# Patient Record
Sex: Male | Born: 1985 | Race: White | Hispanic: No | Marital: Single
Health system: Midwestern US, Community
[De-identification: ages and names within clinical notes are randomized; demographics above are authoritative.]

## PROBLEM LIST (undated history)

## (undated) VITALS — BP 129/87 | HR 92 | Temp 98.5°F | Resp 16 | Ht 70.0 in | Wt 261.0 lb

## (undated) DIAGNOSIS — S069X9A Unspecified intracranial injury with loss of consciousness of unspecified duration, initial encounter: Secondary | ICD-10-CM

## (undated) DIAGNOSIS — S069XAA Unspecified intracranial injury with loss of consciousness status unknown, initial encounter: Secondary | ICD-10-CM

## (undated) DIAGNOSIS — R569 Unspecified convulsions: Secondary | ICD-10-CM

## (undated) DIAGNOSIS — F445 Conversion disorder with seizures or convulsions: Secondary | ICD-10-CM

## (undated) DIAGNOSIS — E785 Hyperlipidemia, unspecified: Secondary | ICD-10-CM

## (undated) DIAGNOSIS — I10 Essential (primary) hypertension: Secondary | ICD-10-CM

## (undated) DIAGNOSIS — Z9289 Personal history of other medical treatment: Secondary | ICD-10-CM

## (undated) DIAGNOSIS — F609 Personality disorder, unspecified: Secondary | ICD-10-CM

## (undated) DIAGNOSIS — F419 Anxiety disorder, unspecified: Secondary | ICD-10-CM

## (undated) DIAGNOSIS — F319 Bipolar disorder, unspecified: Secondary | ICD-10-CM

## (undated) DIAGNOSIS — Z765 Malingerer [conscious simulation]: Secondary | ICD-10-CM

## (undated) DIAGNOSIS — G40909 Epilepsy, unspecified, not intractable, without status epilepticus: Principal | ICD-10-CM

## (undated) DIAGNOSIS — G40901 Epilepsy, unspecified, not intractable, with status epilepticus: Principal | ICD-10-CM

## (undated) HISTORY — PX: BRAIN SURGERY: SHX531

## (undated) HISTORY — PX: LOBECTOMY: SHX5089

## (undated) HISTORY — PX: OTHER SURGICAL HISTORY: SHX169

## (undated) MED ORDER — LEVETIRACETAM 750 MG TAB
750 mg | ORAL_TABLET | Freq: Two times a day (BID) | ORAL | Status: DC
Start: ? — End: 2013-02-21

## (undated) MED ORDER — HYDROCHLOROTHIAZIDE 25 MG TAB
25 mg | ORAL_TABLET | Freq: Every day | ORAL | Status: AC
Start: ? — End: 2013-03-25

## (undated) MED ORDER — TRAZODONE 50 MG TAB
50 mg | ORAL_TABLET | Freq: Every evening | ORAL | Status: AC | PRN
Start: ? — End: 2013-03-16

## (undated) MED ORDER — SIMVASTATIN 10 MG TAB
10 mg | ORAL_TABLET | Freq: Every evening | ORAL | Status: AC
Start: ? — End: 2013-03-25

## (undated) MED ORDER — RISPERIDONE 2 MG TAB
2 mg | ORAL_TABLET | Freq: Every evening | ORAL | Status: AC
Start: ? — End: 2013-03-25

## (undated) MED ORDER — LAMOTRIGINE 150 MG TAB
150 mg | ORAL_TABLET | Freq: Two times a day (BID) | ORAL | Status: AC
Start: ? — End: 2013-03-25

## (undated) MED ORDER — RISPERIDONE 2 MG TAB
2 mg | ORAL_TABLET | Freq: Every evening | ORAL | Status: AC
Start: ? — End: 2013-03-16

## (undated) MED ORDER — PHENYTOIN 50 MG CHEWABLE TAB
50 mg | ORAL_TABLET | Freq: Three times a day (TID) | ORAL | Status: AC
Start: ? — End: 2013-03-25

## (undated) MED ORDER — LISINOPRIL 20 MG TAB
20 mg | ORAL_TABLET | Freq: Every day | ORAL | Status: DC
Start: ? — End: 2013-02-23

## (undated) MED ORDER — PHENYTOIN 50 MG CHEWABLE TAB
50 mg | ORAL_TABLET | Freq: Three times a day (TID) | ORAL | Status: AC
Start: ? — End: 2013-03-16

## (undated) MED ORDER — HYDROCHLOROTHIAZIDE 25 MG TAB
25 mg | ORAL_TABLET | Freq: Every day | ORAL | Status: AC
Start: ? — End: 2013-03-16

## (undated) MED ORDER — LISINOPRIL 20 MG TAB
20 mg | ORAL_TABLET | Freq: Every day | ORAL | Status: AC
Start: ? — End: 2013-03-25

## (undated) MED ORDER — LAMOTRIGINE 150 MG TAB
150 mg | ORAL_TABLET | Freq: Two times a day (BID) | ORAL | Status: AC
Start: ? — End: 2013-03-16

---

## 2007-06-10 ENCOUNTER — Emergency Department (HOSPITAL_COMMUNITY): Admission: EM | Admit: 2007-06-10 | Discharge: 2007-06-10 | Payer: Self-pay | Admitting: Emergency Medicine

## 2007-06-11 ENCOUNTER — Emergency Department: Payer: Self-pay | Admitting: Emergency Medicine

## 2009-10-27 ENCOUNTER — Emergency Department (HOSPITAL_COMMUNITY): Admission: EM | Admit: 2009-10-27 | Discharge: 2009-10-27 | Payer: Self-pay | Admitting: Emergency Medicine

## 2009-10-29 ENCOUNTER — Emergency Department (HOSPITAL_COMMUNITY)
Admission: EM | Admit: 2009-10-29 | Discharge: 2009-10-29 | Payer: Self-pay | Source: Home / Self Care | Admitting: Emergency Medicine

## 2009-10-29 ENCOUNTER — Emergency Department (HOSPITAL_COMMUNITY): Admission: EM | Admit: 2009-10-29 | Discharge: 2009-10-29 | Payer: Self-pay | Admitting: Emergency Medicine

## 2010-07-02 LAB — CBC WITH AUTOMATED DIFF
ABS. BASOPHILS: 0 10*3/uL (ref 0.0–0.1)
ABS. EOSINOPHILS: 0.4 10*3/uL (ref 0.0–0.4)
ABS. LYMPHOCYTES: 3.7 10*3/uL — ABNORMAL HIGH (ref 0.8–3.5)
ABS. MONOCYTES: 0.7 10*3/uL (ref 0.0–1.0)
ABS. NEUTROPHILS: 5.8 10*3/uL (ref 1.8–8.0)
BASOPHILS: 0 % (ref 0–1)
EOSINOPHILS: 4 % (ref 0–7)
HCT: 42.9 % (ref 36.6–50.3)
HGB: 14.9 g/dL (ref 12.1–17.0)
LYMPHOCYTES: 35 % (ref 12–49)
MCH: 30.8 PG (ref 26.0–34.0)
MCHC: 34.7 g/dL (ref 30.0–36.5)
MCV: 88.6 FL (ref 80.0–99.0)
MONOCYTES: 7 % (ref 5–13)
NEUTROPHILS: 54 % (ref 32–75)
PLATELET: 243 10*3/uL (ref 150–400)
RBC: 4.84 M/uL (ref 4.10–5.70)
RDW: 13.2 % (ref 11.5–14.5)
WBC: 10.6 10*3/uL (ref 4.1–11.1)

## 2010-07-02 LAB — METABOLIC PANEL, COMPREHENSIVE
A-G Ratio: 1.3 (ref 1.1–2.2)
ALT (SGPT): 39 U/L (ref 12–78)
AST (SGOT): 23 U/L (ref 15–37)
Albumin: 4.2 g/dL (ref 3.5–5.0)
Alk. phosphatase: 94 U/L (ref 50–136)
Anion gap: 9 mmol/L (ref 5–15)
BUN/Creatinine ratio: 20 (ref 12–20)
BUN: 14 MG/DL (ref 6–20)
Bilirubin, total: 0.2 MG/DL (ref 0.2–1.0)
CO2: 26 MMOL/L (ref 21–32)
Calcium: 9.5 MG/DL (ref 8.5–10.1)
Chloride: 106 MMOL/L (ref 97–108)
Creatinine: 0.7 MG/DL (ref 0.6–1.3)
GFR est AA: >60 mL/min/{1.73_m2} (ref >60)
GFR est non-AA: >60 mL/min/{1.73_m2} (ref >60)
Globulin: 3.3 g/dL (ref 2.0–4.0)
Glucose: 99 MG/DL (ref 65–100)
Potassium: 3.8 MMOL/L (ref 3.5–5.1)
Protein, total: 7.5 g/dL (ref 6.4–8.2)
Sodium: 141 MMOL/L (ref 136–145)

## 2010-07-02 LAB — EKG, 12 LEAD, INITIAL
Atrial Rate: 72 {beats}/min
Calculated P Axis: 22 degrees
Calculated R Axis: -27 degrees
Calculated T Axis: -27 degrees
P-R Interval: 146 ms
Q-T Interval: 388 ms
QRS Duration: 96 ms
QTC Calculation (Bezet): 424 ms
Ventricular Rate: 72 {beats}/min

## 2010-07-02 LAB — PHENYTOIN: Phenytoin: 9.1 ug/mL — ABNORMAL LOW (ref 10.0–20.0)

## 2010-07-02 MED ORDER — BUTALBITAL-ACETAMINOPHEN-CAFFEINE 50 MG-325 MG-40 MG TAB
50-325-40 mg | ORAL | Status: AC
Start: 2010-07-02 — End: 2010-07-02
  Administered 2010-07-02: 10:00:00 via ORAL

## 2010-07-02 MED FILL — BUTALBITAL-ACETAMINOPHEN-CAFFEINE 50 MG-325 MG-40 MG TAB: 50-325-40 mg | ORAL | Qty: 2

## 2010-07-02 NOTE — ED Notes (Signed)
Pt discharged, plans to call ride around 0600

## 2010-07-02 NOTE — ED Notes (Signed)
Pt asking for bus pass.  Spoke with nursing supervisor, pass will be provided

## 2010-07-02 NOTE — ED Notes (Signed)
Per EMS, pt was at [employer] in [place], pt woke up on ground, was stuttering, called EMS. Pt with hx TBI and seizures, states he only stutters after grand mal seizure.  Last seizure prior to this was yesterday.

## 2010-07-02 NOTE — ED Notes (Signed)
Pt given bus pass, reviewed map of bus routes, pt escorted to hospital doors, and directed to bus stop at [address].

## 2010-07-02 NOTE — ED Provider Notes (Signed)
HPI Comments: This is a 24 y.o.male who presents to the ED secondary to seizure. Pt reports that he was at [employer] in [place]; pt states that the next thing he remembers is being on the ground in the parking lot. EMS reports that the pt was stuttering upon their arrival. 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 Pt reports a history of seizures since his TBI. Pt states that he was alone at the time of the seizure. Pt states that he missed two doses of all his medications yesterday. Pt reports HA and back pain. Pt voices no complaints of numbness, dizziness, weakness, neck pain, CP, SOB, cough, congestion, abdominal pain, n/v/d, urinary problems, fever, chills, diaphoresis or any other acute medical problems. Pt states that prior to today's surgery, his last surgery was yesterday.     Neurologist: Dr. Pervis Hocking    Note written by Laurin Coder, Scribe, as dictated by Lorenz Coaster, MD 2:12 AM      The history is provided by the patient and the EMS personnel.        Past Medical History   Diagnosis Date   ??? Seizures    ??? Hypercholesteremia    ??? Anxiety    ??? Bipolar affective    ??? TBI (traumatic brain injury)    ??? Optic nerve trauma      right          Past Surgical History   Procedure Date   ??? Hx orthopaedic      right elbow growth plate fracture   ??? Hx lobectomy      right frontal   ??? Neurological procedure unlisted      reconstruction of skulll   ??? Sinus surgery proc unlisted            Family History   Problem Relation Age of Onset   ??? Cancer Father           History   Social History   ??? Marital Status: Single     Spouse Name: N/A     Number of Children: N/A   ??? Years of Education: N/A   Occupational History   ??? Not on file.   Social History Main Topics   ??? Smoking status: Current Everyday Smoker -- 1.5 packs/day for 16 years   ??? Smokeless tobacco: Never Used   ??? Alcohol Use: No   ??? Drug Use: No   ??? Sexually Active: Yes -- Male partner(s)    Other Topics Concern   ??? Not on file   Social History Narrative   ??? No narrative on file                    ALLERGIES: Iodine      Review of Systems   Constitutional: Negative for fever, chills and diaphoresis.   HENT: Negative for congestion, sore throat, neck pain and neck stiffness.    Eyes: Negative for redness.   Respiratory: Negative for cough, shortness of breath and wheezing.    Cardiovascular: Negative for chest pain and leg swelling.   Gastrointestinal: Negative for nausea, vomiting, abdominal pain, diarrhea and blood in stool.   Genitourinary: Negative for dysuria, frequency and hematuria.   Musculoskeletal: Positive for back pain.   Skin: Negative for rash.   Neurological: Positive for seizures, speech difficulty and headaches. Negative for dizziness, weakness and numbness.   Hematological: Negative.    Psychiatric/Behavioral: Negative for hallucinations and  confusion.   All other systems reviewed and are negative.    Note written by Laurin Coder, Scribe, as dictated by Lorenz Coaster, MD 2:13 AM        Filed Vitals:    07/02/10 0110   BP: 131/88   Pulse: 73   Resp: 17   Height: 5\' 10"  (1.778 m)   Weight: 243 lb (110.224 kg)   SpO2: 96%              Physical Exam   Nursing note and vitals reviewed.  Constitutional: He is oriented to person, place, and time. He appears well-developed and well-nourished. No distress.        Pt is stuttering, which he says is his baseline; pt is postictal    HENT:        Pt has a large, well-healed, frontal skull incision; no recent signs of trauma to the head   Cardiovascular: Normal rate, regular rhythm, normal heart sounds and intact distal pulses.  Exam reveals no gallop and no friction rub.    No murmur heard.  Pulmonary/Chest: Effort normal and breath sounds normal. He has no wheezes. He has no rales.   Abdominal: Soft. He exhibits no distension and no mass. No tenderness.   Musculoskeletal: He exhibits no edema.    Neurological: He is alert and oriented to person, place, and time.   Skin: Skin is warm and dry. No rash noted.   Psychiatric: He has a normal mood and affect.   Note written by Laurin Coder, Scribe, as dictated by Lorenz Coaster, MD 2:13 AM         MDM    Procedures    PROGRESS NOTE:  Pt reports that he is feeling much better and is stuttering much less.  Note written by Laurin Coder, Scribe, as dictated by Lorenz Coaster, MD 4:28 AM

## 2010-07-02 NOTE — ED Notes (Signed)
Pt asking for LCSW. Informed pt care management department opens at 0800. Pt states he will wait to speak to LCSW.  Pt given washcloths to bathe, and scrubs to change into because "I can't walk into my mother's house smelling like piss"

## 2010-07-02 NOTE — ED Notes (Signed)
Spoke with pt, pt planning on utilizing bus service for discharge transportation

## 2010-07-04 LAB — LEVETIRACETAM (KEPPRA): Levetiracetam (Keppra): <2 ug/mL — ABNORMAL LOW (ref 5–30)

## 2010-07-04 LAB — OXCARBAZEPINE(TRILEPTAL): 10-Hydroxycarbazine: <1 ug/mL — ABNORMAL LOW (ref 15–35)

## 2010-07-28 ENCOUNTER — Inpatient Hospital Stay
Admit: 2010-07-28 | Discharge: 2010-07-29 | Discharge status: Against Medical Advice | Payer: MEDICARE | Attending: Psychiatry | Admitting: Psychiatry

## 2010-07-28 DIAGNOSIS — F4321 Adjustment disorder with depressed mood: Secondary | ICD-10-CM

## 2010-07-28 LAB — DRUG SCREEN, URINE
AMPHETAMINES: NEGATIVE
BARBITURATES: NEGATIVE
BENZODIAZEPINES: NEGATIVE
COCAINE: POSITIVE — AB
METHADONE: NEGATIVE
OPIATES: POSITIVE — AB
PCP(PHENCYCLIDINE): NEGATIVE
THC (TH-CANNABINOL): POSITIVE — AB

## 2010-07-28 LAB — POC CHEM8
Anion gap (POC): 16 mmol/L — ABNORMAL HIGH (ref 5–15)
BUN (POC): 14 MG/DL (ref 9–20)
CO2 (POC): 24 MMOL/L (ref 21–32)
Calcium, ionized (POC): 1.17 MMOL/L (ref 1.12–1.32)
Chloride (POC): 106 MMOL/L (ref 98–107)
Creatinine (POC): 0.9 MG/DL (ref 0.6–1.3)
GFRAA, POC: >60 mL/min/{1.73_m2} (ref >60)
GFRNA, POC: >60 mL/min/{1.73_m2} (ref >60)
Glucose (POC): 124 MG/DL — ABNORMAL HIGH (ref 75–110)
Hematocrit (POC): 40 % (ref 36.6–50.3)
Hemoglobin (POC): 13.6 GM/DL (ref 12.1–17.0)
Potassium (POC): 3.5 MMOL/L (ref 3.5–5.1)
Sodium (POC): 142 MMOL/L (ref 137–145)

## 2010-07-28 LAB — METABOLIC PANEL, COMPREHENSIVE
A-G Ratio: 1.3 (ref 1.1–2.2)
ALT (SGPT): 41 U/L (ref 12–78)
AST (SGOT): 14 U/L — ABNORMAL LOW (ref 15–37)
Albumin: 4 g/dL (ref 3.5–5.0)
Alk. phosphatase: 119 U/L (ref 50–136)
Anion gap: 5 mmol/L (ref 5–15)
BUN/Creatinine ratio: 17 (ref 12–20)
BUN: 15 MG/DL (ref 6–20)
Bilirubin, total: 0.4 MG/DL (ref 0.2–1.0)
CO2: 28 MMOL/L (ref 21–32)
Calcium: 9.3 MG/DL (ref 8.5–10.1)
Chloride: 106 MMOL/L (ref 97–108)
Creatinine: 0.9 MG/DL (ref 0.6–1.3)
GFR est AA: >60 mL/min/{1.73_m2} (ref >60)
GFR est non-AA: >60 mL/min/{1.73_m2} (ref >60)
Globulin: 3.1 g/dL (ref 2.0–4.0)
Glucose: 108 MG/DL — ABNORMAL HIGH (ref 65–100)
Potassium: 3.8 MMOL/L (ref 3.5–5.1)
Protein, total: 7.1 g/dL (ref 6.4–8.2)
Sodium: 139 MMOL/L (ref 136–145)

## 2010-07-28 LAB — CBC W/O DIFF
HCT: 42 % (ref 36.6–50.3)
HGB: 14.5 g/dL (ref 12.1–17.0)
MCH: 31 PG (ref 26.0–34.0)
MCHC: 34.5 g/dL (ref 30.0–36.5)
MCV: 89.9 FL (ref 80.0–99.0)
PLATELET: 231 10*3/uL (ref 150–400)
RBC: 4.67 M/uL (ref 4.10–5.70)
RDW: 13.6 % (ref 11.5–14.5)
WBC: 7 10*3/uL (ref 4.1–11.1)

## 2010-07-28 LAB — ETHYL ALCOHOL: ALCOHOL(ETHYL),SERUM: <10 MG/DL (ref <10)

## 2010-07-28 LAB — PHENYTOIN: Phenytoin: 12.2 ug/mL (ref 10.0–20.0)

## 2010-07-28 MED ORDER — KETOROLAC TROMETHAMINE 30 MG/ML INJECTION
30 mg/mL (1 mL) | INTRAMUSCULAR | Status: AC
Start: 2010-07-28 — End: 2010-07-28
  Administered 2010-07-28: 13:00:00 via INTRAMUSCULAR

## 2010-07-28 MED ORDER — ONDANSETRON (PF) 4 MG/2 ML INJECTION
4 mg/2 mL | INTRAMUSCULAR | Status: DC
Start: 2010-07-28 — End: 2010-07-28

## 2010-07-28 MED ORDER — METHYLPREDNISOLONE SODIUM SUCC 125 MG/2 ML IJ SOLR
125 mg/2 mL | INTRAMUSCULAR | Status: DC
Start: 2010-07-28 — End: 2010-07-28

## 2010-07-28 MED ORDER — KETOROLAC TROMETHAMINE 30 MG/ML INJECTION
30 mg/mL (1 mL) | INTRAMUSCULAR | Status: DC
Start: 2010-07-28 — End: 2010-07-28

## 2010-07-28 MED ADMIN — clonazePAM (KLONOPIN) tablet 1 mg: ORAL | @ 22:00:00 | NDC 63739026310

## 2010-07-28 MED ADMIN — phenytoin ER (DILANTIN ER) ER capsule 100 mg: ORAL | @ 22:00:00 | NDC 00071036940

## 2010-07-28 MED ADMIN — levetiracetam (KEPPRA) oral solution 250 mg: ORAL | @ 22:00:00 | NDC 99990233605

## 2010-07-28 MED FILL — SOLU-MEDROL 125 MG/2 ML SOLUTION FOR INJECTION: 125 mg/2 mL | INTRAMUSCULAR | Qty: 2

## 2010-07-28 MED FILL — KETOROLAC TROMETHAMINE 30 MG/ML INJECTION: 30 mg/mL (1 mL) | INTRAMUSCULAR | Qty: 1

## 2010-07-28 MED FILL — SODIUM CHLORIDE 0.9 % IV: INTRAVENOUS | Qty: 1000

## 2010-07-28 MED FILL — CLONAZEPAM 0.5 MG TAB: 0.5 mg | ORAL | Qty: 2

## 2010-07-28 MED FILL — ONDANSETRON (PF) 4 MG/2 ML INJECTION: 4 mg/2 mL | INTRAMUSCULAR | Qty: 2

## 2010-07-28 MED FILL — LEVETIRACETAM 500 MG/5 ML ORAL SOLN: 500 mg/5 mL (5 mL) | ORAL | Qty: 5

## 2010-07-28 MED FILL — PHENYTOIN SODIUM EXTENDED 100 MG CAP: 100 mg | ORAL | Qty: 1

## 2010-07-28 NOTE — Behavioral Health Treatment Team (Cosign Needed)
Pt did not attend creative expression group.

## 2010-07-28 NOTE — ED Notes (Addendum)
Patient never related to his nurse that he was having any psych issues until after he was admitted but state he was not trying to harm himself at this time or wanting to hurt others pt has  Been cooperative up until time for him to be admitted.
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
## 2010-07-28 NOTE — Progress Notes (Signed)
TRANSFER - OUT REPORT:    Verbal report given to Nurse  Pope RN(name) on [name]  being transferred to behavorial health(unit) for routine progression of care       Report consisted of patient???s Situation, Background, Assessment and   Recommendations(SBAR).     Information from the following report(s) SBAR was reviewed with the receiving nurse.    Opportunity for questions and clarification was provided.

## 2010-07-28 NOTE — Consults (Signed)
Medical Consult for [facility] Patient    Consult H&P   dictated, see patient chart [phone]    Impression:    Tristian Sickinger a 25 y.o. male with past medical history of epilepsy, traumatic brian injury, and mental health dz presents with behavioral health problems of suicidal ideation and polysubstance abuse admitted for further psychiatric evaluation and treatment.    Plan:   1. Psychiatry to manage mental health issues  2. Continuing home medications.  3. Medically stable at this time, will follow up as needed.    Thank you  Fay Records, MD  07/28/2010, 4:02 PM

## 2010-07-28 NOTE — Behavioral Health Treatment Team (Signed)
Admit assessment completed. Pt is currently out and visible on unit, interacting with staff frequently. Pt makes good eye contact. A&O x 4. Affect is blunted, mood is generally pleasant. Hygiene is adequate, independent in ADLs. Gait is steady. Sleep and appetite patterns WNL. Attending some groups with participation. No evidence of anxiety. Denies SI/HI at this time. He states he became depressed and suicidal after using THC, cocaine and heroine. Denies hallucinations at any time over his lifetime. Verbally contracts for safety with good eye contact. Pt encouraged to continue to participate in care. Will continue to monitor pt with q 15 min checks.

## 2010-07-28 NOTE — Consults (Signed)
Name: [name], [name] Admitted: 07/28/2010  MR #: [id number] DOB: May 17, 1986  Account #: [id number] Age: 25  Consultant: Sherilyn Cooter, MD Location [phone]     CONSULTATION REPORT    DATE OF CONSULTATION      REFERRING PHYSICIAN: Camie Patience, MD    REASON FOR CONSULTATION: Medical evaluation for psychiatric admission.    CHIEF COMPLAINT: Suicidal ideations.    HISTORY OF PRESENT ILLNESS: This is a 25 year old male with a past medical  history of traumatic brain injury and seizure who presents depression,  suicidal with admitted experimentation of polysubstance, presents suicidal,  requiring further psychiatric evaluation and treatment. Has no complaints  at this time. Denies any chest pain, shortness of breath, nausea, vomiting,  or diarrhea. States he has an appointment next week to see a neurologist at  [facility].    PAST MEDICAL HISTORY: Epilepsy, hyperlipidemia, anxiety, hypertension, and  bipolar traumatic brain injury and right optic nerve trauma.    PAST SURGICAL HISTORY: Right lobectomy and reconstruction of the skull,  right elbow surgery, sinus surgery.    ALLERGIES: IODINE.    MEDICINES  1 Dilantin ER 100 MG t.i.d.  2. Keppra 250 mg b.i.d.  3. HCTZ 25 mg daily.  4. Mevacor 20 mg at bedtime.  5. Klonopin 1 mg t.i.d.    SOCIAL HISTORY: Smokes a pack of cigarettes a day. Denies any alcohol use.  Does experiment with drugs admits to using cocaine, heroin, and marijuana  over the last day. Single and has no kids. He has disability from acute  brain injury.    PHYSICAL EXAMINATION  VITAL SIGNS: Temperature is 97.9, blood pressure 148/84, pulse 85,  respirations 18, height is 5 feet 9 inches, weight is 255, pulse oximetry  98%.  GENERAL: Pleasant, in no acute distress.  HEENT: Oropharynx is clear.  NECK: Supple. No lymphadenopathy.  LUNGS: Clear to auscultation. No wheezes, rhonchi. No increased work of  breath. No accessory muscle use.   CARDIOVASCULAR: Regular rhythm and rate. No murmurs, gallops, or rubs.  ABDOMEN: Soft, nontender, nondistended. Normoactive bowel sounds. No  hepatosplenomegaly.  EXTREMITIES: No cyst, clubbing or edema.    LABORATORY DATA: Sodium 142, potassium 3.5, chloride 106, bicarbonate 24  BUN is 14, creatinine 10.9, glucose 124. Hemoglobin is 13.6, hematocrit is  43, ________ level was 12.2. Tox screen was positive for cocaine, opioids  and THC. Alcohol level less than 10.    IMPRESSION: A 25 year old male with past medical history of epilepsy.  Traumatic brain injury, hyperlipidemia, and mental health disease who  presents with suicidal ideation, severely depressed, recent use of  polysubstances, admitted for further psychiatric evaluation and treatment.    PLAN  1. Psychiatry management issues.  2. Continue current medical regimen.  3. Medically stable. Follow up on a p.r.n. basis.    Thank you for this consult.              Sherilyn Cooter, MD    cc: Sherilyn Cooter, MD      DC/wmx; D: 07/28/2010 4:08 P; T: 07/28/2010 1:28 P; DOC# [id number]; Job#  [id number]

## 2010-07-28 NOTE — ED Provider Notes (Signed)
HPI Comments: Pt has also been depressed and suicidal, his father was recently diagnosed with cancer and the whole family is having a hard time with it. The patient is recently relocated from [facility] and has an appt with a new neurologist here at [facility] sometime in the next few weeks     Patient is a 25 y.o. male presenting with seizures, migraine, and depression. The history is provided by the patient.   Seizure   This is a recurrent problem. The current episode started 12 to 24 hours ago. The problem has not changed since onset. There were 2 - 3 seizures. Duration: [place]. Pertinent negatives include no sore throat, no chest pain and no diarrhea. [place] The episode was not witnessed. There was no sensation of an aura present.  The seizures did not continue in the ED. Focality: [place]. There has been no fever.  He reports no chest pain, no diarrhea and no sore throat. Home seizure medications include: Dilantin and Keppra.  Migraine   This is a recurrent problem. The current episode started yesterday. The problem occurs constantly. The problem has not changed since onset. The headache is aggravated by bright light and activity. The pain is located in the right unilateral region. The quality of the pain is described as sharp. The pain is at a severity of 4/10. The pain is moderate. He has tried nothing for the symptoms. The treatment provided no relief.   Depression   This is a recurrent problem. The current episode started 2 days ago. The problem has been gradually worsening. Mental status baseline is normal.  His past medical history is significant for seizures.        Past Medical History   Diagnosis Date   ??? Seizures    ??? Hypercholesteremia    ??? Anxiety    ??? Bipolar affective    ??? TBI (traumatic brain injury)    ??? Optic nerve trauma      right          Past Surgical History   Procedure Date   ??? Hx lobectomy      right frontal   ??? Neurological procedure unlisted      reconstruction of skulll   ??? Hx orthopaedic       right elbow growth plate fracture   ??? Sinus surgery proc unlisted            Family History   Problem Relation Age of Onset   ??? Cancer Father           History   Social History   ??? Marital Status: Single     Spouse Name: N/A     Number of Children: N/A   ??? Years of Education: N/A   Occupational History   ??? Not on file.   Social History Main Topics   ??? Smoking status: Current Everyday Smoker -- 1.5 packs/day for 16 years   ??? Smokeless tobacco: Never Used   ??? Alcohol Use: No   ??? Drug Use: No   ??? Sexually Active: No   Other Topics Concern   ??? Not on file   Social History Narrative   ??? No narrative on file                    ALLERGIES: Iodine      Review of Systems   HENT: Negative for sore throat.    Cardiovascular: Negative for chest pain.   Gastrointestinal: Negative for diarrhea.  Psychiatric/Behavioral: Positive for depression.   All other systems reviewed and are negative.        Filed Vitals:    07/28/10 0654   BP: 148/84   Pulse: 85   Temp: 97.9 ??F (36.6 ??C)   Resp: 18   Height: 5\' 9"  (1.753 m)   Weight: 255 lb (115.667 kg)   SpO2: 98%              Physical Exam   Nursing note and vitals reviewed.  Constitutional: He is oriented to person, place, and time. He appears well-developed and well-nourished.   HENT:   Head: Normocephalic and atraumatic.   Right Ear: External ear normal.   Left Ear: External ear normal.   Mouth/Throat: Oropharynx is clear and moist. No oropharyngeal exudate.   Eyes: Conjunctivae and EOM are normal. Pupils are equal, round, and reactive to light. Right eye exhibits no discharge. Left eye exhibits no discharge. No scleral icterus.   Neck: Normal range of motion. Neck supple. No tracheal deviation present. No thyromegaly present.   Cardiovascular: Normal rate, regular rhythm, normal heart sounds and intact distal pulses.    No murmur heard.  Pulmonary/Chest: Effort normal and breath sounds normal. No respiratory distress. He has no wheezes. He has no rales.    Abdominal: Soft. He exhibits no distension. No tenderness. He has no rebound and no guarding.   Musculoskeletal: Normal range of motion. He exhibits no edema and no tenderness.   Lymphadenopathy:     He has no cervical adenopathy.   Neurological: He is alert and oriented to person, place, and time. No cranial nerve deficit. Coordination normal.   Skin: Skin is warm. No rash noted. No erythema.   Psychiatric: His speech is normal and behavior is normal. Cognition and memory are impaired. He expresses impulsivity. He exhibits a depressed mood. He expresses suicidal ideation.        MDM    Procedures

## 2010-07-28 NOTE — ED Notes (Signed)
Pt refusing to get IV or meds that were order for him by MD, DR.Mayford Knife made aware.

## 2010-07-29 DIAGNOSIS — F4321 Adjustment disorder with depressed mood: Secondary | ICD-10-CM

## 2010-07-29 LAB — TSH 3RD GENERATION: TSH: 2 u[IU]/mL (ref 0.36–3.74)

## 2010-07-29 MED ADMIN — clonazePAM (KLONOPIN) tablet 1 mg: ORAL | @ 22:00:00 | NDC 63739026310

## 2010-07-29 MED ADMIN — phenytoin ER (DILANTIN ER) ER capsule 100 mg: ORAL | @ 18:00:00 | NDC 51079090501

## 2010-07-29 MED ADMIN — LORazepam (ATIVAN) tablet 2 mg: ORAL | @ 03:00:00 | NDC 68084008911

## 2010-07-29 MED ADMIN — levetiracetam (KEPPRA) oral solution 250 mg: ORAL | @ 22:00:00 | NDC 00121480205

## 2010-07-29 MED ADMIN — phenytoin ER (DILANTIN ER) ER capsule 100 mg: ORAL | @ 22:00:00 | NDC 51079090501

## 2010-07-29 MED ADMIN — clonazePAM (KLONOPIN) tablet 1 mg: ORAL | @ 18:00:00 | NDC 63739026310

## 2010-07-29 MED ADMIN — influenza vaccine tr-s 11 (PF) (FLUVIRIN, FLUZONE, AFLURIA) injection 0.5 mL: INTRAMUSCULAR | @ 03:00:00 | NDC 66521011402

## 2010-07-29 MED ADMIN — clonazePAM (KLONOPIN) tablet 1 mg: ORAL | @ 14:00:00 | NDC 63739026310

## 2010-07-29 MED ADMIN — levetiracetam (KEPPRA) oral solution 250 mg: ORAL | @ 14:00:00 | NDC 99990233605

## 2010-07-29 MED ADMIN — hydrochlorothiazide (HYDRODIURIL) tablet 25 mg: ORAL | @ 14:00:00 | NDC 00172208380

## 2010-07-29 MED ADMIN — LORazepam (ATIVAN) tablet 2 mg: ORAL | @ 11:00:00 | NDC 68084008911

## 2010-07-29 MED ADMIN — pravastatin (PRAVACHOL) tablet 20 mg: ORAL | @ 03:00:00 | NDC 00904611361

## 2010-07-29 MED ADMIN — LORazepam (ATIVAN) tablet 2 mg: ORAL | @ 15:00:00 | NDC 68084008911

## 2010-07-29 MED ADMIN — phenytoin ER (DILANTIN ER) ER capsule 100 mg: ORAL | @ 14:00:00 | NDC 51079090501

## 2010-07-29 MED FILL — CLONAZEPAM 0.5 MG TAB: 0.5 mg | ORAL | Qty: 2

## 2010-07-29 MED FILL — LORAZEPAM 1 MG TAB: 1 mg | ORAL | Qty: 2

## 2010-07-29 MED FILL — PHENYTOIN SODIUM EXTENDED 100 MG CAP: 100 mg | ORAL | Qty: 1

## 2010-07-29 MED FILL — LEVETIRACETAM 500 MG/5 ML ORAL SOLN: 500 mg/5 mL (5 mL) | ORAL | Qty: 5

## 2010-07-29 MED FILL — HYDROCHLOROTHIAZIDE 25 MG TAB: 25 mg | ORAL | Qty: 1

## 2010-07-29 MED FILL — PRAVASTATIN 10 MG TAB: 10 mg | ORAL | Qty: 2

## 2010-07-29 MED FILL — LORAZEPAM 2 MG/ML IJ SOLN: 2 mg/mL | INTRAMUSCULAR | Qty: 1

## 2010-07-29 MED FILL — FLU VACCINE TR-VL SPLT 2011-12(PF) 45 MCG (15 MCG X 3)/0.5 ML IM SYRNG: 45 mcg (15 mcg x 3)/0.5 mL | INTRAMUSCULAR | Qty: 0.5

## 2010-07-29 NOTE — Other (Signed)
GROUP THERAPY PROGRESS NOTE    Math Brazie is participating in Substance abuse.     Group time: 1 hour    Personal goal for participation: Progression of substance abuse    Goal orientation: personal    Group therapy participation: active    Therapeutic interventions reviewed and discussed: Discussed what we do and do not have control over.  Also watched and discussed the video: The Second Half.    Impression of participation: Pt came to group late.  Nonetheless, he actively participated in the second part of group.    Iona Coach, LCSW

## 2010-07-29 NOTE — ED Provider Notes (Signed)
HPI Comments: This is a 19 y/old male with past medical hx of hypercholesteremia, anxiety, bipolar, TBI, optic nerve trauma, seizures, and HTN who presents to the ED for seizure and mental health problem. Pt states he has had "4 seizures in the past 48 hours". Pt says "My head is killing me". Pt is stating he is "depressed" and was admitted to [facility] today. Pt asked the Nurse Practitioner that saw him about the goals and treatments he was to have and he was told that it's "none of his business". Pt was seen for one day and a half. Pt is from [place] and just moved here to care for his father who was diagnosed with cancer. Pt states he has had multiple "stressors" both from his personal life and with family matters. Pt was diagnosed with bipolar and he reports his Psychiatrist has been "weening him off his meds for the past year and a half". Pt has been taking Prozac, Lamictal, and Trazadone. Pt states prozac has had side effects on him. He will get "mean and angry" with Prozac. Pt states Lamictal has helped. Pt was taken off on all. Pt reports other stressors from home including him not getting along with his father's wife. 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 Pt reports he has "suicidal thoughts" at this time. When asked, "Why are you suicidal?" Pt reports "Why not be suicidal?" Pt states he has been hospitalized due to overdoses of many drugs and other attempts to suicide. Has overdosed on medications, poisons, and drugs in the past. Full history, physical exam, and ROS unable to be obtained due to:  Psych d/o  Note written by Sharion Settler, Scribe, as dictated by Arbutus Ped, MD 12:01 AM    Pt did not like the plan he was prescribed to see his psychiatrist as an outpatient and left while being seen by Dr. Mayford Knife. Pt walked out, angrily stated "F---doctor".    Note written by Sharion Settler, Scribe, as dictated by Arbutus Ped, MD 11:59 PM            Patient is a 25 y.o. male presenting with mental health disorder. The history is provided by the patient. The history is limited by the condition of the patient.   Mental Health Problem   Associated symptoms include numbness. His past medical history is significant for seizures.        Past Medical History   Diagnosis Date   ??? Hypercholesteremia    ??? Anxiety    ??? Bipolar affective    ??? TBI (traumatic brain injury)    ??? Optic nerve trauma      right   ??? Seizures    ??? Hypertension           Past Surgical History   Procedure Date   ??? Sinus surgery proc unlisted    ??? Hx orthopaedic      right elbow growth plate fracture   ??? Hx lobectomy      right frontal   ??? Neurological procedure unlisted      reconstruction of skulll           Family History   Problem Relation Age of Onset   ??? Cancer Father           History   Social History   ??? Marital Status: Single     Spouse Name: N/A     Number  of Children: N/A   ??? Years of Education: N/A   Occupational History   ??? Not on file.   Social History Main Topics   ??? Smoking status: Current Everyday Smoker -- 1.0 packs/day for 16 years   ??? Smokeless tobacco: Never Used   ??? Alcohol Use: No   ??? Drug Use: Yes     Special: Marijuana, Cocaine, Opiates, Heroin   ??? Sexually Active: No   Other Topics Concern   ??? Not on file   Social History Narrative   ??? No narrative on file                    ALLERGIES: Iodine and Shellfish      Review of Systems   Unable to perform ROS: Mental status change   Neurological: Positive for numbness.       Filed Vitals:    07/29/10 1935 07/29/10 2137   BP: 158/101 166/107   Pulse: 88 72   Temp: 97.3 ??F (36.3 ??C)    Resp: 18    Height: 5\' 10"  (1.778 m)    Weight: 250 lb (113.399 kg)    SpO2: 100% 99%              Physical Exam     MDM    Procedures

## 2010-07-29 NOTE — ED Notes (Signed)
Discussed with Charge - Lynden Oxford, HPD asked to watch pt.

## 2010-07-29 NOTE — Behavioral Health Treatment Team (Cosign Needed)
GROUP THERAPY PROGRESS NOTE    [name] is participating in [place].     Group time: 30 minutes    Personal goal for participation: daily/program orientation    Goal orientation: community    Group therapy participation: absent-pt is recent admission-not yet oriented to program    Therapeutic interventions reviewed and discussed: yes    Impression of participation: n/a

## 2010-07-29 NOTE — Behavioral Health Treatment Team (Signed)
GROUP THERAPY PROGRESS NOTE    [name] is participating in Nursing Education Group.     Group time: 45 minutes    Personal goal for participation: Educate on use and side effects of medications    Goal orientation: medication education    Group therapy participation: active    Therapeutic interventions reviewed and discussed: Yes    Impression of participation: Good

## 2010-07-29 NOTE — H&P (Signed)
Patient seen, chart reviewed, staffing held and dictated report done. Please see dictated report for complete details.

## 2010-07-29 NOTE — H&P (Signed)
Patient interviewed and seen in treatment team rounds as an active team leader along with NP.  I have made additions to the note above in all appropriate areas.    Huberta Tompkins R. Eyvonne Burchfield, MD

## 2010-07-29 NOTE — Behavioral Health Treatment Team (Signed)
AMA Discharge: Pt complains that he does not believe that he is receiving the services that he so desires.  Pt states that he has not met with his treatment team and is not aware of his treatment goal.  [name] is requesting to leave AMA at this time.  Telephone call to Dr. Andria Meuse informing him of patient's request.  AMA orders received.

## 2010-07-29 NOTE — ED Notes (Signed)
Pt left ED voluntarily after yelling and cussing at Dr Mayford Knife.  Pt left AMA.  Was not d/c.  Pt was requesting to be admitted to psych to manage his psychotropic meds.
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
## 2010-07-29 NOTE — Behavioral Health Treatment Team (Cosign Needed Addendum)
Patient state he was admitted to the hospital the hospital because of increase depression,found out his father had cancer and he was using drugs.Writer talked with patient about increase substance abuse can cause increase depression.Client states since he founddout about fathers illness he started to have a hard time,but did not go into detail.Affect is blunted,pleasant on approach,contracts for safety,patient denies suicidal, homicidal ideation at the present time.No signs of distress noted,patient is med,meal complaint and remains on safety checks.Patient verbalized about wanting tto get straight on his med's so he can get stable.

## 2010-07-29 NOTE — ED Notes (Signed)
Dr Williams at bedside.

## 2010-07-29 NOTE — ED Notes (Signed)
[facility] at bedside.

## 2010-07-29 NOTE — Behavioral Health Treatment Team (Signed)
Remained in bed quiet all night, monitored q 15 minutes for safety

## 2010-07-29 NOTE — ED Notes (Signed)
Pt at [facility] this am for seizure, no treatment. Admitted to when to [facility] later today, admitted to Psych - left AMA.

## 2010-07-29 NOTE — ED Notes (Signed)
Pt found and placed in [facility].

## 2010-07-29 NOTE — ED Notes (Signed)
HDP in waiting room to watch patient.

## 2010-07-29 NOTE — Progress Notes (Signed)
Spiritual Care Assessment/Progress Notes    [name] [id number]  [id number]    [birth year]  25 y.o.  male    Patient Telephone Number: [phone] (home)   Religious Affiliation: Catholic   Language: English   Extended Emergency Contact Information  Primary Emergency Contact: Klein,Steve  Address: [address]           [place], [place] [address] [place]  Home Phone: [phone]  Relation: Parent  Secondary Emergency Contact: Terisa St[place]  Home Phone: [phone]  Relation: Friend   Patient Active Problem List   Diagnoses Date Noted   ??? Adjustment disorder with depressed mood [309.0] 07/29/2010   ??? Polysubstance abuse [305.90CQ] 07/29/2010   ??? Borderline personality disorder [301.83] 07/29/2010          Date: July 29, 2010       Level of Religious/Spiritual Activity:  [x]               Involved in faith tradition/spiritual practice    []               Not involved in faith tradition/spiritual practice  []               Spiritually oriented    []               Claims no spiritual orientation    []               seeking spiritual identity  []               Feels alienated from religious practice/tradition  []               Feels angry about religious practice/tradition  [x]               Spirituality/religious tradition is a Theatre stage manager for coping at this time.  []               Not able to assess due to medical condition    Services Provided Today:  []               crisis intervention    []               reading Scriptures  [x]               spiritual assessment    [x]               prayer  [x]               empathic listening/emotional support  []               rites and rituals (cite in comments)  []               life review     [x]               religious support  []               theological development   []               advocacy  []               ethical dialog     [x]               blessing  []               bereavement support    []   support to family   []               anticipatory grief support   []               help with AMD  []               spiritual guidance    []               meditation      Spiritual Care Needs  []               Emotional Support  []               Spiritual/Religious Care  []               Loss/Adjustment  []               Advocacy/Referral /Ethics  [x]               No needs expressed at this time  []               Other: (note in comments)  Spiritual Care Plan  []               Follow up visits with pt/family  []               Provide materials  []               Schedule sacraments  []               Contact Community Clergy  [x]               Follow up as needed  []               Other: (note in comments)     Comments: Pt shared some of the issues that brought him in.  Pt expressed quite a bit of dissatisfaction with his situation.  Spiritual support was offered and pt asked for prayers for him.      Hilbert Odor, MA, MTS, BCC, Staff [place]

## 2010-07-29 NOTE — ED Notes (Signed)
5 seizures in past 48 hours. History of head injury. New to [facility] with no neurology here.

## 2010-07-29 NOTE — Behavioral Health Treatment Team (Signed)
GROUP THERAPY PROGRESS NOTE    [name] is participating in Process Group.     Group time: 35 minutes    Personal goal for participation: Active Participation    Goal orientation: personal    Group therapy participation: active    Therapeutic interventions reviewed and discussed: Developing goals to manage health    Impression of participation: Pt was appropriate in the group setting, very active in discussion.

## 2010-07-29 NOTE — ED Notes (Signed)
Pt called to room, unable to find, security called.

## 2010-07-29 NOTE — Behavioral Health Treatment Team (Cosign Needed)
[name] who was absent from [facility] group.    JAMES E CROSS  07/29/2010  2:44 PM

## 2010-07-29 NOTE — ED Notes (Signed)
Denies SI or HI thoughts. Pt does have a plan.

## 2010-07-30 LAB — DRUG SCREEN, URINE
AMPHETAMINES: NEGATIVE
BARBITURATES: NEGATIVE
BENZODIAZEPINES: NEGATIVE
COCAINE: POSITIVE — AB
METHADONE: NEGATIVE
OPIATES: NEGATIVE
PCP(PHENCYCLIDINE): NEGATIVE
THC (TH-CANNABINOL): NEGATIVE

## 2010-07-30 LAB — METABOLIC PANEL, COMPREHENSIVE
A-G Ratio: 1.2 (ref 1.1–2.2)
ALT (SGPT): 41 U/L (ref 12–78)
AST (SGOT): 19 U/L (ref 15–37)
Albumin: 4.5 g/dL (ref 3.5–5.0)
Alk. phosphatase: 92 U/L (ref 50–136)
Anion gap: 11 mmol/L (ref 5–15)
BUN/Creatinine ratio: 17 (ref 12–20)
BUN: 12 MG/DL (ref 6–20)
Bilirubin, total: 0.3 MG/DL (ref 0.2–1.0)
CO2: 28 MMOL/L (ref 21–32)
Calcium: 9.8 MG/DL (ref 8.5–10.1)
Chloride: 103 MMOL/L (ref 97–108)
Creatinine: 0.7 MG/DL (ref 0.6–1.3)
GFR est AA: >60 mL/min/{1.73_m2} (ref >60)
GFR est non-AA: >60 mL/min/{1.73_m2} (ref >60)
Globulin: 3.7 g/dL (ref 2.0–4.0)
Glucose: 75 MG/DL (ref 65–100)
Potassium: 3.5 MMOL/L (ref 3.5–5.1)
Protein, total: 8.2 g/dL (ref 6.4–8.2)
Sodium: 142 MMOL/L (ref 136–145)

## 2010-07-30 LAB — URINALYSIS W/ REFLEX CULTURE
Bacteria: NEGATIVE /HPF
Bilirubin: NEGATIVE
Blood: NEGATIVE
Glucose: NEGATIVE MG/DL
Ketone: NEGATIVE MG/DL
Leukocyte Esterase: NEGATIVE
Nitrites: NEGATIVE
Protein: NEGATIVE MG/DL
Specific gravity: 1.012 (ref 1.003–1.030)
Urobilinogen: 0.2 EU/DL (ref 0.2–1.0)
pH (UA): 6 (ref 5.0–8.0)

## 2010-07-30 LAB — CBC WITH AUTOMATED DIFF
ABS. BASOPHILS: 0 10*3/uL (ref 0.0–0.1)
ABS. EOSINOPHILS: 0.3 10*3/uL (ref 0.0–0.4)
ABS. LYMPHOCYTES: 2.8 10*3/uL (ref 0.8–3.5)
ABS. MONOCYTES: 0.6 10*3/uL (ref 0.0–1.0)
ABS. NEUTROPHILS: 6.1 10*3/uL (ref 1.8–8.0)
BASOPHILS: 0 % (ref 0–1)
EOSINOPHILS: 3 % (ref 0–7)
HCT: 44 % (ref 36.6–50.3)
HGB: 15.4 g/dL (ref 12.1–17.0)
LYMPHOCYTES: 29 % (ref 12–49)
MCH: 31.2 PG (ref 26.0–34.0)
MCHC: 35 g/dL (ref 30.0–36.5)
MCV: 89.2 FL (ref 80.0–99.0)
MONOCYTES: 6 % (ref 5–13)
NEUTROPHILS: 62 % (ref 32–75)
PLATELET: 210 10*3/uL (ref 150–400)
RBC: 4.93 M/uL (ref 4.10–5.70)
RDW: 13.3 % (ref 11.5–14.5)
WBC: 9.8 10*3/uL (ref 4.1–11.1)

## 2010-07-30 LAB — ETHYL ALCOHOL: ALCOHOL(ETHYL),SERUM: <10 MG/DL (ref <10)

## 2010-07-30 LAB — PHENYTOIN: Phenytoin: 12 ug/mL (ref 10.0–20.0)

## 2010-07-30 LAB — ACETAMINOPHEN: Acetaminophen level: <2 ug/mL — ABNORMAL LOW (ref 10–30)

## 2010-07-30 MED ORDER — KETOROLAC TROMETHAMINE 30 MG/ML INJECTION
30 mg/mL (1 mL) | INTRAMUSCULAR | Status: DC
Start: 2010-07-30 — End: 2010-07-30

## 2010-07-30 MED ORDER — IBUPROFEN 200 MG TAB
200 mg | ORAL | Status: AC
Start: 2010-07-30 — End: 2010-07-30
  Administered 2010-07-30: 07:00:00 via ORAL

## 2010-07-30 MED FILL — FOSPHENYTOIN 500 MG PE/10 ML INJECTION: 500 mg PE/10 mL | INTRAMUSCULAR | Qty: 20

## 2010-07-30 MED FILL — IBUPROFEN 200 MG TAB: 200 mg | ORAL | Qty: 1

## 2010-07-30 NOTE — ED Notes (Signed)
Patient given discharge instructions.  Patient understands he is going to Crisis Stabilization Unit via cab.  Cab ETA 30 minutes.

## 2010-07-30 NOTE — Other (Addendum)
Patient seen earlier in evening and admission was declined.  Patient also signed himself out of [facility] because he reported he couldn't see his treatment plan and staff made comments about him.  Patient returned stating that he was sorry he left earlier [facility] and he really is suicidal.  He reported family problems and increased depression.  Patient returns to the ED for re-evaluation.  WIll consult with Dr. Karie Mainland and advise him of decision.  Reviewed his medical record from [facility] and Diagnosis was Adjustment Disorder and Polysubstance Abuse and Borderline PD.  Prognosis was reported to be poor basic on his lack of engagement in therapeutic activities.  Dr. Karie Mainland declined admission after being re-called.  Advised Jiles Prows at [facility] of plan for discharge and she is consulting her supervisor and will get back to this clinician to see if there is anything that can be offered to patient.  Jiles Prows returned call and will assess for possible Crisis Stabilization.  Patient is awaiting cab to [facility].

## 2010-07-30 NOTE — ED Notes (Signed)
[facility] at bedside.

## 2010-07-30 NOTE — ED Notes (Signed)
Patient resting in stretcher, lights dim watching TV.

## 2010-07-30 NOTE — ED Notes (Signed)
Patient is to be discharged to Crisis Stabilization Unit at [facility].

## 2010-07-30 NOTE — Other (Addendum)
Axis I   Bipolar disorders bipolar disorder nos  Axis II   Borderline personality disorder   Axis III   General medical conditions  Axis IV  Problems with primary support group      Axis V  50-41 Serious symptoms (e.g., suicidal ideation, severe obsessional rituals, frequent shoplifting) OR any serious impairment in social, occupational, or school functioning (e.g., no friends, unable to keep a job).      Past Medical History   Diagnosis Date   ??? Hypercholesteremia    ??? Anxiety    ??? Bipolar affective    ??? TBI (traumatic brain injury)    ??? Optic nerve trauma      right   ??? Seizures    ??? Hypertension             Comprehensive Assessment Form Part 1    Section I - Disposition       The Medical Doctor to Psychiatrist conference was not completed.  The Medical Doctor is in agreement with Psychiatrist disposition because of (reason) Consulted with Dr. Karie Mainland who declined admission at this time.  Patient ended up walking out AMA prior to Dr. Mayford Knife completing his medical examination.    The plan is discharge and gave referral to [facility].  Patient also asked for [facility] and [facility] numbers and was given those as well.  The on-call Psychiatrist consulted was Dr. Karie Mainland.  The admitting Psychiatrist will be Dr. Ian Bushman.  The admitting Diagnosis is NA.  The Payor source is Medicare.   Section II - Integrated Summary  Summary:  Patient came in reporting seizures and depression.  Patient reported having 4 recent seizures.  Patient also reported he had been off Bipolar medications for 1.5 years because his Psychiatrist in [place] takes him off of his medications when he is doing well.  Patient has just signed himself out of [facility] today AMA.  Patient has had several other previous admissions to psychiatric facilities.    The patient is deemed competent to provide informed consent.  The information is given by the patient and previous medical records.  The Chief Complaint is seizures and depression.   The Precipitant Factors are patient reported concern about his dad's cancer and his own health.  Previous Hospitalizations: [facility] most recent  Current Psychiatrist and/or Case Manager is NA.    Lethality Assessment:    The potential for suicide is noted by the following: previous history of attempts which occurred on (date)last year  in the form(s) of overdose,  Patient denied feeling suicidal at the time of assessment but indicated he considered it earlier in the evening.  Patient wants to be admitted for medication management.   The potential for homicide is not noted.  The patient has not been a perpetrator of sexual or physical abuse.  There are pending charges and are listed as: drunk in public.  The patient is not felt to be at risk for self harm or harm to others.     Section III - Psychosocial  The patient's overall mood and attitude is depressed.  Feelings of helplessness and hopelessness are not observed.  Generalized anxiety is not observed.  Panic is not observed. Phobias are not observed.  Obsessive compulsive tendencies are not observed.      Section IV - Mental Status Exam  The patient's appearance shows no evidence of impairment.  The patient's behavior shows no evidence of impairment. The patient is oriented to time, place, person and situation.  The patient's speech shows no evidence of impairment.  The patient's mood  is depressed.  The range of affect is labile.  The patient's thought content  demonstrates no evidence of impairment.  The thought process shows no evidence of impairment.  The patient's perception shows no evidence of impairment. The patient's memory shows no evidence of impairment.  The patient's appetite is increased and shows signs of weight gain.  The patient's sleep has evidence of hypersomnia. The patient shows no insight.  The patient's judgement is psychologically impaired.        Section V - Substance Abuse   The patient is using substances.  The patient is using alcohol   , marijuana   cocaine     and heroin.   Patient reported he seldom drinks and last drank 1 week ago.  Patient reported last week he used marijuana, cocaine, and heroin for first time.      Section VI - Living Arrangements  The patient is single.  The patient lives with a parent. The patient has no children.  The patient does plan to return home upon discharge.  The patient does have legal issues pending. The patient's source of income comes from disability.  Religious and cultural practices have not been voiced at this time.    The patient's greatest support comes from father, sister, and aunt in [place] and this person will not be involved with the treatment.    The patient has not been in an event described as horrible or outside the realm of ordinary life experience either currently or in the past.  The patient has not been a victim of sexual/physical abuse.    Section VII - Other Areas of Clinical Concern  The highest grade achieved is college with the overall quality of school experience being described as good.  .  The patient is currently  disabled and speaks [place] as a primary language.  The patient has no communication impairments affecting communication. The patient's preference for learning can be described as: can read and write adequately.  The patient's hearing is normal.  The patient's vision is impaired and  wears glasses or contacts .      KIMBERLY G SALELPC

## 2010-07-30 NOTE — ED Provider Notes (Signed)
HPI Comments: This is a 25 y.o.male who presents to the ED secondary to suicidal ideations. Pt was admitted to the psychiatric unit at [facility] two days ago on "a voluntary basis for acute stabilization secondary to depressed mood." The admitting physician at [facility] gave the pt provisional diagnoses of "adjustment disorder with depressed mood, polysubstance abuse (tobacco, cocaine, marijuana, opiates), borderline personality disorder versus traits thereof, seizure disorder and a history of migraines." Medical records indicate that the treatment plan for the pt included "inpatient psychiatric treatment while in the unit." Pt reports that he left the psychiatric ward at [facility] stating that the staff in the psychiatric ward "didn't let (him) see (his) medical treatment plan." Pt states that he has psychiatric issues and reports that he has thoughts of overdosing or cutting himself. Pt states that his father is in declining health and states that his family does not really like him. Pt was seen in the [facility] ED immediately prior to signing back in to the [facility] ED for his current visit; pt states that he left the ED the first time because he felt as though the ED physician was "being rude." Pt reports that currently he has a HA and does not "feel safe leaving a health facility."  Pt also reports past history of seizures and states that he has been taking his Dilantin and Kepra as directed; pt denies missing any doses. Pt voices no complaints of numbness, dizziness, weakness, neck pain, back pain, CP, SOB, cough, congestion, abdominal pain, n/v/d, urinary problems, fever, chills, diaphoresis or any other acute medical problems.     Pt smokes  Pt drinks EtOH  Pt did marijuana, heroin and cocaine three days ago    Note written by Laurin Coder, Scribe, as dictated by Levada Schilling, MD 2:53 AM        The history is provided by the patient and medical records.        Past Medical History   Diagnosis Date    ??? Hypercholesteremia    ??? Anxiety    ??? Bipolar affective    ??? TBI (traumatic brain injury)    ??? Optic nerve trauma      right   ??? Seizures    ??? Hypertension           Past Surgical History   Procedure Date   ??? Sinus surgery proc unlisted    ??? Hx orthopaedic      right elbow growth plate fracture   ??? Hx lobectomy      right frontal   ??? Neurological procedure unlisted      reconstruction of skulll           Family History   Problem Relation Age of Onset   ??? Cancer Father           History   Social History   ??? Marital Status: Single     Spouse Name: N/A     Number of Children: N/A   ??? Years of Education: N/A   Occupational History   ??? Not on file.   Social History Main Topics   ??? Smoking status: Current Everyday Smoker -- 1.0 packs/day for 16 years   ??? Smokeless tobacco: Never Used   ??? Alcohol Use: Yes      ocassion   ??? Drug Use: Yes     Special: Marijuana, Cocaine, Opiates, Heroin   ??? Sexually Active: No   Other Topics Concern   ??? Not on file  Social History Narrative   ??? No narrative on file                    ALLERGIES: Iodine and Shellfish      Review of Systems   Constitutional: Negative for fever, chills and diaphoresis.   HENT: Negative for congestion, sore throat, neck pain and neck stiffness.    Eyes: Negative for redness.   Respiratory: Negative for cough, shortness of breath and wheezing.    Cardiovascular: Negative for chest pain and leg swelling.   Gastrointestinal: Negative for nausea, vomiting, abdominal pain, diarrhea and blood in stool.   Genitourinary: Negative for dysuria, frequency and hematuria.   Musculoskeletal: Negative.  Negative for back pain.   Skin: Negative for rash.   Neurological: Positive for headaches. Negative for dizziness, weakness and numbness.   Hematological: Negative.    Psychiatric/Behavioral: Positive for suicidal ideas and self-injury. Negative for hallucinations and confusion.   All other systems reviewed and are negative.     Note written by Laurin Coder, Scribe, as dictated by Levada Schilling, MD 2:55 AM        Filed Vitals:    07/30/10 0118   BP: 150/95   Pulse: 86   Temp: 98.5 ??F (36.9 ??C)   Resp: 18   Height: 5\' 10"  (1.778 m)   Weight: 253 lb (114.76 kg)   SpO2: 98%              Physical Exam   Nursing note and vitals reviewed.  Constitutional: He is oriented to person, place, and time. He appears well-developed and well-nourished. No distress.   HENT:   Head: Normocephalic and atraumatic.   Neck: Neck supple.   Cardiovascular: Normal rate, regular rhythm, normal heart sounds and intact distal pulses.  Exam reveals no gallop and no friction rub.    No murmur heard.  Pulmonary/Chest: Effort normal and breath sounds normal. He has no wheezes. He has no rales.   Abdominal: Soft. He exhibits no distension and no mass. No tenderness.   Musculoskeletal: He exhibits no edema.   Neurological: He is alert and oriented to person, place, and time.   Skin: Skin is warm and dry. No rash noted.   Psychiatric: He has a normal mood and affect.   Note written by Laurin Coder, Scribe, as dictated by Levada Schilling, MD 2:55 AM         MDM    Procedures    CONSULT:  Selena Batten - will consult [facility].    CONSULT:  Maralyn Sago [facility] - has arranged for patient to go to Crisis Stabilization Unit under care of Dr. Barnetta Chapel.    A/P:  1.  Depression  2.  Suicidal ideation - being transported by cab to CSU.  3.  Poysubstance abuse.    Medically cleared.    6:44 AM   Patient's results have been reviewed with them.  Patient and/or family have verbally conveyed their understanding and agreement of the patient's signs, symptoms, diagnosis, treatment and prognosis and additionally agree to follow up as recommended or return to the Emergency Room should their condition change prior to follow-up.  Discharge instructions have also been provided to the patient with some educational information regarding their diagnosis as well a list of reasons why they would want to return to the ER prior to their follow-up appointment should their condition change.

## 2010-07-30 NOTE — ED Notes (Signed)
Pt sleeping in stretcher no complaints at this time.

## 2010-07-30 NOTE — ED Notes (Signed)
Cab arrived patient ambulatory off unit.

## 2010-07-30 NOTE — ED Notes (Signed)
Pt states " the fact of the matter is that I truly want to die", he is somewhat tearful in triage, states as well he " sincerely apologizes for leaving earlier. He has not tried to harm himself today, 2 days ago he tried to overdose on xanax which he took from his sister's house. Pt verbally contracts for safety with me.

## 2010-08-03 LAB — DRUG SCREEN, URINE
AMPHETAMINES: NEGATIVE
BARBITURATES: NEGATIVE
BENZODIAZEPINES: NEGATIVE
COCAINE: NEGATIVE
METHADONE: NEGATIVE
OPIATES: NEGATIVE
PCP(PHENCYCLIDINE): NEGATIVE
THC (TH-CANNABINOL): NEGATIVE

## 2010-08-03 LAB — PHENYTOIN: Phenytoin: 9.5 ug/mL — ABNORMAL LOW (ref 10.0–20.0)

## 2010-08-03 LAB — ETHYL ALCOHOL: ALCOHOL(ETHYL),SERUM: <10 MG/DL (ref <10)

## 2010-08-03 NOTE — ED Notes (Signed)
[employer] at bedside.

## 2010-08-03 NOTE — ED Notes (Signed)
Blood draw attempt x 2 by nurse. Unsuccessful. Pt reports not wanting arterial stick.

## 2010-08-03 NOTE — ED Notes (Addendum)
Pt requesting bus ticket: pt given a bus ticket and a transfer ticket

## 2010-08-03 NOTE — Other (Addendum)
Axis I  Adjustment disorder with depressed mood  Axis II   Borderline personality disorder   Axis III   General medical conditions and none    Axis IV   social environment  Axis  Poor Coping Skills       The Medical Doctor to Psychiatrist conference was not completed.  The Medical Doctor is in agreement with Psychiatrist disposition because of patient's scheduled psychiatric appointment & not meeting criteria for admission.  The plan is patient is to be discharged & referred back to [facility] for outpatient treatment.  The on-call Psychiatrist consulted was Sander Nephew, NP.  The admitting Psychiatrist will not be applicable.  The admitting Diagnosis is NA.  The Payer source is Medicaid/Medicare.  The name of the representative was NA.  This was not approved.     Section II - Integrated Summary  Summary:  25 year old male presented with reported increased depression & suicidal ideation. Patient was seen on 07/29/10 @ [facility] & admitted. Patient left on 07/30/10 AMA.  The patient is deemed competent to provide informed consent.  The information is given by the patient and past medical records.  The Chief Complaint is depression.  The Precipitant Factors are treatment non compliance.  Previous Hospitalizations: 1/12  The patient has not previously been in restraints.  Current Psychiatrist and/or Case Manager is Dr Pearson Grippe.    Lethality Assessment:    The potential for suicide is noted by the following: ideation.  The potential for homicide is not noted.  The patient has not been a perpetrator of sexual or physical abuse.  There are not pending charges.  The patient is not felt to be at risk for self harm or harm to others.  The attending nurse was advised that security has been notified..    Section III - Psychosocial   The patient's overall mood and attitude is uncooperative.  Feelings of helplessness and hopelessness are not observed.  Generalized anxiety is not observed.  Panic is not observed. Phobias are not observed.  Obsessive compulsive tendencies are not observed.      Section IV - Mental Status Exam  The patient's appearance is unkempt.  The patient's behavior is agitated. The patient is oriented to time, place, person and situation.  The patient's speech shows no evidence of impairment.  The patient's mood  is hostile.  The range of affect shows no evidence of impairment.  The patient's thought content  demonstrates no evidence of impairment.  The thought process is circumstantial.  The patient's perception shows no evidence of impairment. The patient's memory shows no evidence of impairment.  The patient's appetite shows no evidence of impairment.  The patient's sleep shows no evidence of impairment. The patient's insight is blaming.  The patient's judgement is psychologically impaired.        Section V - Substance Abuse  The patient is using substances.  The patient is using alcohol for 5-10 years.   Section VI - Living Arrangements  The patient is single.  The patient lives with a parent. The patient has no children.  The patient does plan to return home upon discharge.  The patient does not have legal issues pending. The patient's source of income comes from disability.  Religious and cultural practices have not been voiced at this time.    The patient's greatest support comes from family and this person will be involved with the treatment.    The patient has been in an event described as  horrible or outside the realm of ordinary life experience either currently or in the past.  The patient has not been a victim of sexual/physical abuse.    Section VII - Other Areas of Clinical Concern  The highest grade achieved is unknown with the overall quality of school experience being described as unknown.   The patient is currently  disabled and speaks [place] as a primary language.  The patient has no communication impairments affecting communication. The patient's preference for learning can be described as: can read and write adequately.  The patient's hearing is normal.  The patient's vision is normal .  Patient has chosen not to follow up with area psychiatry after moving to area from [place] in August 2011.Patient does not meet criteria for admission at this time. He was advised to keep scheduled appointment on 08/28/10.       Claudie Leach      Patient given a list of resources to assist with suicidal precautions.

## 2010-08-03 NOTE — ED Notes (Signed)
Pt calm and cooperative. Pt placed in room and is changing into gown.

## 2010-08-03 NOTE — ED Provider Notes (Signed)
Patient is a 25 y.o. male presenting with mental health disorder. The history is provided by the patient. No language interpreter was used.   Mental Health Problem   This is a chronic problem. The current episode started more than 1 week ago. The problem has not changed since onset. "feels stressed". Mental status baseline is normal.  Risk factors include illicit drug use and the patient not taking meds correctly. His past medical history is significant for psychotropic medication treatment.      Recent voluntary psych admit with AMA discharge, poor compliance with treatment plan per record. [place] from [place], per pt out of meds and stressed out.     Past Medical History   Diagnosis Date   ??? Hypercholesteremia    ??? Anxiety    ??? Bipolar affective    ??? TBI (traumatic brain injury)    ??? Optic nerve trauma      right   ??? Seizures    ??? Hypertension           Past Surgical History   Procedure Date   ??? Sinus surgery proc unlisted    ??? Hx orthopaedic      right elbow growth plate fracture   ??? Hx lobectomy      right frontal   ??? Neurological procedure unlisted      reconstruction of skulll           Family History   Problem Relation Age of Onset   ??? Cancer Father           History   Social History   ??? Marital Status: Single     Spouse Name: N/A     Number of Children: N/A   ??? Years of Education: N/A   Occupational History   ??? Not on file.   Social History Main Topics   ??? Smoking status: Current Everyday Smoker -- 1.0 packs/day for 16 years   ??? Smokeless tobacco: Never Used   ??? Alcohol Use: Yes      ocassion   ??? Drug Use: Yes     Special: Marijuana, Cocaine, Opiates, Heroin   ??? Sexually Active: No   Other Topics Concern   ??? Not on file   Social History Narrative   ??? No narrative on file                    ALLERGIES: Iodine and Shellfish      Review of Systems   All other systems reviewed and are negative.        Filed Vitals:    08/03/10 0559   BP: 138/83   Pulse: 74   Temp: 98.1 ??F (36.7 ??C)   Resp: 18    Height: 5\' 10"  (1.778 m)   Weight: 245 lb (111.131 kg)   SpO2: 98%              Physical Exam   Nursing note and vitals reviewed.  Constitutional: He is oriented to person, place, and time. He appears well-developed and well-nourished.   HENT:   Head: Normocephalic and atraumatic.   Right Ear: External ear normal.   Left Ear: External ear normal.   Mouth/Throat: Oropharynx is clear and moist. No oropharyngeal exudate.   Eyes: Conjunctivae and EOM are normal. Pupils are equal, round, and reactive to light. Right eye exhibits no discharge. Left eye exhibits no discharge. No scleral icterus.   Neck: Normal range of motion. Neck supple. No tracheal deviation  present. No thyromegaly present.   Cardiovascular: Normal rate, regular rhythm, normal heart sounds and intact distal pulses.    No murmur heard.  Pulmonary/Chest: Effort normal and breath sounds normal. No respiratory distress. He has no wheezes. He has no rales.   Abdominal: Soft. He exhibits no distension. No tenderness. He has no rebound and no guarding.   Musculoskeletal: Normal range of motion. He exhibits no edema and no tenderness.   Lymphadenopathy:     He has no cervical adenopathy.   Neurological: He is alert and oriented to person, place, and time. No cranial nerve deficit. Coordination normal.   Skin: Skin is warm. No rash noted. No erythema.   Psychiatric: He has a normal mood and affect. His behavior is normal. Judgment and thought content normal.        MDM    Procedures    Evaluated by [facility], not a candidate for admission, provided crisis numbers, has appt Feb 3. Advised to contact previous MH provider for refills of meds. Return for acute change. Pt understands and cooperative.

## 2010-08-03 NOTE — ED Notes (Signed)
Patient (s)  given copy of dc instructions and 0 script(s).  Patient (s)  verbalized understanding of instructions and script (s).  Patient given a current medication reconciliation form and verbalized understanding of their medications.   Patient (s) verbalized understanding of the importance of discussing medications with  his or her physician or clinic they will be following up with.  Patient alert and oriented and in no acute distress.  Patient discharged home ambulatory with self.

## 2010-08-03 NOTE — ED Notes (Signed)
Pt refusing arterial blood draw.

## 2010-08-03 NOTE — ED Notes (Signed)
Patient asking to speak to the supervisor: nurse in to speak with patient: pt asking why he is not going to be admitted: nurse explained to patient that he had been evaluated by [facility] and that the [facility] counselor had spoken to the psychatrist on the phone and that it was determined that he did not meet criteria to be admitted: pt asked nurse who would be liable if a psych patient came in and we discharged them and then they killed themselves: nurse out to speak with [facility] counselor: per Annette Stable patient does not meet criteria for admission and will be discharged

## 2010-08-03 NOTE — ED Notes (Signed)
Pt reports being seen several days ago here at was admitted to psychiatric unit. Pt reports wanting to see "treatment team and I went off and walked out."

## 2010-08-03 NOTE — ED Notes (Signed)
Pt given an emergency department mental health discharge resources sheet as part of his discharge instructions.

## 2010-08-03 NOTE — ED Notes (Signed)
Assumed care of pt. [facility] counselor at bedside evaluating pt.

## 2010-08-21 DIAGNOSIS — F3130 Bipolar disorder, current episode depressed, mild or moderate severity, unspecified: Secondary | ICD-10-CM
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
## 2010-08-21 NOTE — ED Notes (Signed)
Asked pt to provide urine sample at this time.

## 2010-08-21 NOTE — ED Notes (Signed)
Pt sleeping. Awaiting reeval by MD.

## 2010-08-21 NOTE — ED Provider Notes (Signed)
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 Currently on Keppra and Dilantin and he has been on these medications for over a year. He has breakthrough seizures once every one to two weeks. Today he had 2 seizures within an hour of each other. He denies any injury from the seizures specifically no head injury or extremity pain. He claims to be taking his medications regularly. Denies any recent fever, alcohol or drug abuse. He says he had normal amount of sleep last night. His primary care physician is Dr. Loreta Ave. He has his usual post seizure headache. He denies chest pain, cough, dyspnea, abdominal pain, nausea, vomiting, diarrhea, melena, hematochezia or urinary symptoms. History of cocaine abuse in the past, denies any recent use.    Patient is a 25 y.o. male presenting with seizures and headaches.   Seizure   Associated symptoms include headaches.   He reports headaches.   Headache          Past Medical History   Diagnosis Date   ??? Hypercholesteremia    ??? Anxiety    ??? Bipolar affective    ??? TBI (traumatic brain injury)    ??? Optic nerve trauma      right   ??? Seizures    ??? Hypertension           Past Surgical History   Procedure Date   ??? Sinus surgery proc unlisted    ??? Hx orthopaedic      right elbow growth plate fracture   ??? Hx lobectomy      right frontal   ??? Neurological procedure unlisted      reconstruction of skulll           Family History   Problem Relation Age of Onset   ??? Cancer Father           History   Social History   ??? Marital Status: Single     Spouse Name: N/A     Number of Children: N/A   ??? Years of Education: N/A   Occupational History   ??? Not on file.   Social History Main Topics   ??? Smoking status: Current Everyday Smoker -- 1.0 packs/day for 16 years   ??? Smokeless tobacco: Never Used   ??? Alcohol Use: Yes      ocassion   ??? Drug Use: Yes     Special: Marijuana, Cocaine, Opiates, Heroin    ??? Sexually Active: No   Other Topics Concern   ??? Not on file   Social History Narrative   ??? No narrative on file                    ALLERGIES: Iodine and Shellfish      Review of Systems   Neurological: Positive for headaches.   All other systems reviewed and are negative.        Filed Vitals:    08/21/10 1939   BP: 124/65   Pulse: 95   Temp: 98.7 ??F (37.1 ??C)   Resp: 18   Height: 5\' 10"  (1.778 m)   Weight: 251 lb (113.853 kg)   SpO2: 98%              Physical Exam     CONSTITUTIONAL: Well-appearing; well-nourished; in moderate distress with headache and speaking very softly but with clear speech.  HEAD: Normocephalic; atraumatic, prior surgical scars that are well-healed  on his scalp.  EYES: PERRL; EOM intact; conjunctiva and sclera are clear bilaterally.  ENT: There is no rhinorrhea; normal pharynx with no tonsillar hypertrophy; mucous membranes pink and moist, no erythema, no exudates.  NECK: Supple; not tender; no cervical lymphadenopathy; no meningeal signs. He has painless full range of motion of the cervical spine.  CARD: Normal S1, S2; no murmurs, no rubs, no gallops. The rate is normal and rhythm is regular.  RESP: There is normal respiratory effort; breath sounds are clear and equal; no wheezes, no rhonchi, no rales.  ABD: Abdomen appears normal.  Bowel sounds are normal.  There is no tenderness or guarding on palpation.  There is no organomegaly. No masses, no hernia. Abdomen is not distended. Abdomen is soft. There is no rebound and no rigidity.  BACK: Normal inspection, no CVAT, no tenderness to percuss over spine  EXT: Normal ROM in all four extremities; not tender to palpation; distal pulses are normal, no edema.  SKIN: Normal for age and race; warm; dry; good turgor; no apparent lesions, exudates or rashes. There are no ulcers.   NEURO: Alert & oriented x 3, coherent, N II-XII grossly intact, sensory and motor are not focal or laterally asymmetric.      MDM    Procedures

## 2010-08-21 NOTE — ED Notes (Signed)
Pt arrived via EMS with reported "2 seizures" today. Pt has seizure history r/t a traumatic brain injury in 2000. Does report having aura before seziure activity. None witnessed by EMS, none upon arrival to ER. Only c/o is headache 8/10. This is normal for him post seizure

## 2010-08-21 NOTE — ED Notes (Signed)
Pt unable to give me urine specimen at this time. Resting comfortably in bed.

## 2010-08-21 NOTE — ED Notes (Signed)
Verbal report received from Leotis Shames, RN. Will assume care of the patient at this time. Pt resting quietly on the stretcher with eyes closed, no signs of distress noted.

## 2010-08-22 ENCOUNTER — Inpatient Hospital Stay
Admit: 2010-08-22 | Discharge: 2010-08-25 | Disposition: A | Discharge status: Home / Self Care | Payer: MEDICARE | Attending: Psychiatry | Admitting: Psychiatry

## 2010-08-22 LAB — METABOLIC PANEL, COMPREHENSIVE
A-G Ratio: 1.4 (ref 1.1–2.2)
ALT (SGPT): 31 U/L (ref 12–78)
AST (SGOT): 21 U/L (ref 15–37)
Albumin: 3.9 g/dL (ref 3.5–5.0)
Alk. phosphatase: 75 U/L (ref 50–136)
Anion gap: 12 mmol/L (ref 5–15)
BUN/Creatinine ratio: 19 (ref 12–20)
BUN: 17 MG/DL (ref 6–20)
Bilirubin, total: 0.4 MG/DL (ref 0.2–1.0)
CO2: 26 MMOL/L (ref 21–32)
Calcium: 8.9 MG/DL (ref 8.5–10.1)
Chloride: 107 MMOL/L (ref 97–108)
Creatinine: 0.9 MG/DL (ref 0.6–1.3)
GFR est AA: >60 mL/min/{1.73_m2} (ref >60)
GFR est non-AA: >60 mL/min/{1.73_m2} (ref >60)
Globulin: 2.7 g/dL (ref 2.0–4.0)
Glucose: 122 MG/DL — ABNORMAL HIGH (ref 65–100)
Potassium: 3.8 MMOL/L (ref 3.5–5.1)
Protein, total: 6.6 g/dL (ref 6.4–8.2)
Sodium: 145 MMOL/L (ref 136–145)

## 2010-08-22 LAB — POC CHEM8
Anion gap (POC): 16 mmol/L — ABNORMAL HIGH (ref 5–15)
BUN (POC): 18 MG/DL (ref 9–20)
CO2 (POC): 27 MMOL/L (ref 21–32)
Calcium, ionized (POC): 1.18 MMOL/L (ref 1.12–1.32)
Chloride (POC): 104 MMOL/L (ref 98–107)
Creatinine (POC): 1 MG/DL (ref 0.6–1.3)
GFRAA, POC: >60 mL/min/{1.73_m2} (ref >60)
GFRNA, POC: >60 mL/min/{1.73_m2} (ref >60)
Glucose (POC): 127 MG/DL — ABNORMAL HIGH (ref 75–110)
Hematocrit (POC): 38 % (ref 36.6–50.3)
Hemoglobin (POC): 12.9 GM/DL (ref 12.1–17.0)
Potassium (POC): 3.6 MMOL/L (ref 3.5–5.1)
Sodium (POC): 143 MMOL/L (ref 137–145)

## 2010-08-22 LAB — CBC WITH AUTOMATED DIFF
ABS. BASOPHILS: 0 10*3/uL (ref 0.0–0.1)
ABS. EOSINOPHILS: 0.1 10*3/uL (ref 0.0–0.4)
ABS. LYMPHOCYTES: 2.3 10*3/uL (ref 0.8–3.5)
ABS. MONOCYTES: 0.6 10*3/uL (ref 0.0–1.0)
ABS. NEUTROPHILS: 6.4 10*3/uL (ref 1.8–8.0)
BASOPHILS: 0 % (ref 0–1)
EOSINOPHILS: 1 % (ref 0–7)
HCT: 39.8 % (ref 36.6–50.3)
HGB: 13.5 g/dL (ref 12.1–17.0)
LYMPHOCYTES: 24 % (ref 12–49)
MCH: 30.4 PG (ref 26.0–34.0)
MCHC: 33.9 g/dL (ref 30.0–36.5)
MCV: 89.6 FL (ref 80.0–99.0)
MONOCYTES: 7 % (ref 5–13)
NEUTROPHILS: 68 % (ref 32–75)
PLATELET: 214 10*3/uL (ref 150–400)
RBC: 4.44 M/uL (ref 4.10–5.70)
RDW: 13.3 % (ref 11.5–14.5)
WBC: 9.5 10*3/uL (ref 4.1–11.1)

## 2010-08-22 LAB — DRUG SCREEN, URINE
AMPHETAMINES: NEGATIVE
BARBITURATES: NEGATIVE
BENZODIAZEPINES: POSITIVE — AB
COCAINE: POSITIVE — AB
METHADONE: NEGATIVE
OPIATES: NEGATIVE
PCP(PHENCYCLIDINE): NEGATIVE
THC (TH-CANNABINOL): POSITIVE — AB

## 2010-08-22 LAB — URINALYSIS W/ RFLX MICROSCOPIC
Bilirubin: NEGATIVE
Blood: NEGATIVE
Glucose: NEGATIVE MG/DL
Ketone: NEGATIVE MG/DL
Leukocyte Esterase: NEGATIVE
Nitrites: NEGATIVE
Protein: NEGATIVE MG/DL
Specific gravity: 1.022 (ref 1.003–1.030)
Urobilinogen: 0.2 EU/DL (ref 0.2–1.0)
pH (UA): 6 (ref 5.0–8.0)

## 2010-08-22 LAB — PHENYTOIN
Phenytoin: <0.5 ug/mL — ABNORMAL LOW (ref 10.0–20.0)
Phenytoin: 8.4 ug/mL — ABNORMAL LOW (ref 10.0–20.0)

## 2010-08-22 MED ORDER — LORAZEPAM 2 MG/ML IJ SOLN
2 mg/mL | INTRAMUSCULAR | Status: AC | PRN
Start: 2010-08-22 — End: 2010-08-21
  Administered 2010-08-22: 03:00:00 via INTRAVENOUS

## 2010-08-22 MED ORDER — LEVETIRACETAM 500 MG TAB
500 mg | ORAL | Status: AC
Start: 2010-08-22 — End: 2010-08-21
  Administered 2010-08-22: 05:00:00 via ORAL

## 2010-08-22 MED ADMIN — hydrochlorothiazide (HYDRODIURIL) tablet 25 mg: ORAL | @ 16:00:00 | NDC 68084008611

## 2010-08-22 MED ADMIN — ketorolac (TORADOL) injection 15 mg: INTRAVENOUS | @ 03:00:00 | NDC 10019002912

## 2010-08-22 MED ADMIN — clonazePAM (KLONOPIN) tablet 1 mg: ORAL | @ 16:00:00 | NDC 00904610261

## 2010-08-22 MED ADMIN — fosphenytoin (CEREBYX) 1,000 mg PE in 0.9% sodium chloride 100 mL IVPB: INTRAVENOUS | @ 05:00:00 | NDC 63323040310

## 2010-08-22 MED ADMIN — lamoTRIgine (LAMICTAL) tablet 100 mg: ORAL | @ 23:00:00 | NDC 68084031911

## 2010-08-22 MED ADMIN — clonazePAM (KLONOPIN) tablet 1 mg: ORAL | @ 19:00:00 | NDC 00904610261

## 2010-08-22 MED ADMIN — levETIRAcetam (KEPPRA) tablet 250 mg: ORAL | @ 23:00:00 | NDC 68084033611

## 2010-08-22 MED ADMIN — levETIRAcetam (KEPPRA) tablet 250 mg: ORAL | @ 16:00:00 | NDC 68084033611

## 2010-08-22 MED ADMIN — phenytoin ER (DILANTIN ER) ER capsule 100 mg: ORAL | @ 23:00:00 | NDC 00071036940

## 2010-08-22 MED ADMIN — lamoTRIgine (LAMICTAL) tablet 100 mg: ORAL | @ 16:00:00 | NDC 68084031911

## 2010-08-22 MED ADMIN — FLUoxetine (PROZAC) capsule 40 mg: ORAL | @ 16:00:00 | NDC 00904578561

## 2010-08-22 MED ADMIN — phenytoin ER (DILANTIN ER) ER capsule 100 mg: ORAL | @ 16:00:00 | NDC 00071036940

## 2010-08-22 MED FILL — DILANTIN EXTENDED 100 MG CAPSULE: 100 mg | ORAL | Qty: 1

## 2010-08-22 MED FILL — LAMOTRIGINE 100 MG TAB: 100 mg | ORAL | Qty: 1

## 2010-08-22 MED FILL — KETOROLAC TROMETHAMINE 30 MG/ML INJECTION: 30 mg/mL (1 mL) | INTRAMUSCULAR | Qty: 0.5

## 2010-08-22 MED FILL — LOVASTATIN 20 MG TAB: 20 mg | ORAL | Qty: 1

## 2010-08-22 MED FILL — FOSPHENYTOIN 500 MG PE/10 ML INJECTION: 500 mg PE/10 mL | INTRAMUSCULAR | Qty: 20

## 2010-08-22 MED FILL — CLONAZEPAM 1 MG TAB: 1 mg | ORAL | Qty: 1

## 2010-08-22 MED FILL — LEVETIRACETAM 250 MG TAB: 250 mg | ORAL | Qty: 1

## 2010-08-22 MED FILL — LORAZEPAM 2 MG/ML IJ SOLN: 2 mg/mL | INTRAMUSCULAR | Qty: 1

## 2010-08-22 MED FILL — KETOROLAC TROMETHAMINE 15 MG/ML INJECTION: 15 mg/mL | INTRAMUSCULAR | Qty: 1

## 2010-08-22 MED FILL — HYDROCHLOROTHIAZIDE 25 MG TAB: 25 mg | ORAL | Qty: 1

## 2010-08-22 MED FILL — FLUOXETINE 20 MG CAP: 20 mg | ORAL | Qty: 2

## 2010-08-22 MED FILL — LEVETIRACETAM 500 MG TAB: 500 mg | ORAL | Qty: 1

## 2010-08-22 NOTE — Behavioral Health Treatment Team (Signed)
Klonopin 1 mg po given for up anxiety and to help aid sleep.

## 2010-08-22 NOTE — Progress Notes (Signed)
TRANSFER - IN REPORT:    Verbal report received from [place], RN on [name]  being received from [facility] for routine progression of care      Report consisted of patient???s Situation, Background, Assessment and   Recommendations(SBAR).     Information from the following report(s) SBAR, Kardex and ED Summary was reviewed with the receiving nurse.    Opportunity for questions and clarification was provided.      Assessment completed upon patient???s arrival to unit and care assumed.
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
## 2010-08-22 NOTE — ED Notes (Signed)
Patient contracts for safety while in the ED after reporting some thoughts of suicide.

## 2010-08-22 NOTE — Progress Notes (Signed)
Spiritual Care Assessment/Progress Notes    [name] [id number]  [id number]    [birth year]  25 y.o.  male    Patient Telephone Number: [phone] (home)   Religious Affiliation: [name]   Language: [name]   Extended Emergency Contact Information  Primary Emergency Contact: [name]  Address: [address]           [place], [place] [address] [place]  Home Phone: [phone]  Relation: Parent  Secondary Emergency Contact: [name] [place] of [place]  Home Phone: [phone]  Relation: Friend   Patient Active Problem List   Diagnoses Date Noted   ??? 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 Polysubstance overdose [977.9AM] 08/22/2010     Class: Chronic   ??? Adjustment disorder with depressed mood [309.0] 07/29/2010   ??? Polysubstance abuse [305.90CQ] 07/29/2010   ??? Borderline personality disorder [301.83] 07/29/2010          Date: 08/22/2010       Level of Religious/Spiritual Activity:  [x]           Involved in faith tradition/spiritual practice    []           Not involved in faith tradition/spiritual practice  []           Spiritually oriented    []           Claims no spiritual orientation    []           seeking spiritual identity  []           Feels alienated from religious practice/tradition  []           Feels angry about religious practice/tradition  [x]           Spirituality/religious tradition is a Theatre stage manager for coping at this time.  []           Not able to assess due to medical condition    Services Provided Today:  []           crisis intervention    []           reading Scriptures  [x]           spiritual assessment    []           prayer  [x]           empathic listening/emotional support  []           rites and rituals (cite in comments)  [x]           life review     []           religious support  [x]           theological development   []           advocacy  []           ethical dialog     []           blessing   []           bereavement support    []           support to family  []           anticipatory grief support   [x]           help with AMD  []           spiritual guidance    []           meditation  Spiritual Care Needs  []           Emotional Support  []           Spiritual/Religious Care  []           Loss/Adjustment  []           Advocacy/Referral /Ethics  [x]           No needs expressed at this time  []           Other: (note in comments)  Spiritual Care Plan  []           Follow up visits with pt/family  []           Provide materials  []           Schedule sacraments  []           Contact Community Clergy  [x]           Follow up as needed  []           Other: (note in comments)     Comments: I helped [name] complete his AMD and provided emotional and spiritual support.    Timmothy Euler, M.Div, Staff Chaplain
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
## 2010-08-22 NOTE — ED Notes (Signed)
[employer] has just been at the bedside. IV medications are infusing without difficulty. Pt connected to the monitor X3. Pt requesting food and drink, provided patient with such items. No signs of distress noted. Will continue to monitor.
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
## 2010-08-22 NOTE — Behavioral Health Treatment Team (Signed)
GROUP THERAPY PROGRESS NOTE    [name] is participating in D/C Planning: Feelings About Discharge    Group time: 45 minutes    Personal goal for participation: [facility]ight     Goal orientation: personal    Group therapy participation: active    Therapeutic interventions reviewed and discussed: No    Impression of participation: Pt. disclosed he feels very cofident about his discharge because he is leaving the state to start a new life.

## 2010-08-22 NOTE — Behavioral Health Treatment Team (Signed)
Pt complaining of anxiety. Received Klonopin 1 mg po PRN. Will continue to monitor.

## 2010-08-22 NOTE — Behavioral Health Treatment Team (Cosign Needed)
Chris did not attend Reflections group this evening.

## 2010-08-22 NOTE — Progress Notes (Signed)
TRANSFER - OUT REPORT:    Verbal report given to Darl Pikes (name) on [name]  being transferred to [facility] (unit) for routine progression of care       Report consisted of patient???s Situation, Background, Assessment and   Recommendations(SBAR).     Information from the following report(s) SBAR, ED Summary and MAR was reviewed with the receiving nurse.    Opportunity for questions and clarification was provided.

## 2010-08-22 NOTE — Behavioral Health Treatment Team (Signed)
0215    Pt arrived on unit ambulatory with steady gait.  Pt is a voluntary admission.    0345    Pt skin assessment by P Hardwicke RN-BC and [employer] BHT.  Pt has scar over top of head from Lt ear to Rt ear (surgical scar); Rt elbow scar; birthmark Rt inner thigh.    0500    Valuables to safe:  Three dollars; one cell phone; one VISA card.   To [facility]:  Cigarettes; one lighter; one lip balm.

## 2010-08-22 NOTE — Behavioral Health Treatment Team (Signed)
Admission reviewed for medical necessity. Will follow with care mngt.
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
## 2010-08-22 NOTE — Behavioral Health Treatment Team (Signed)
Pt has been out on unit for meals only and has needed encouragement from staff to come out of room to take scheduled meds. Pt continues to isolate self in room.

## 2010-08-22 NOTE — Progress Notes (Signed)
TRANSFER - IN REPORT:    Verbal report received from [facility] RN(name) on [name]  being received from [facility](unit) for routine progression of care      Report consisted of patient???s Situation, Background, Assessment and   Recommendations(SBAR).     Information from the following report(s) SBAR, Kardex, ED Summary, [facility] and Recent Results was reviewed with the receiving nurse.    Opportunity for questions and clarification was provided.      Assessment completed upon patient???s arrival to unit and care assumed.
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
## 2010-08-22 NOTE — ED Notes (Signed)
Chem 8 results had not crossed over. Attempted to dock the POC machine several times without crossover results. Metabolic panel is currently running in the lab. Security paged to transport patient upstairs.

## 2010-08-22 NOTE — ED Notes (Signed)
Pt ambulatory upstairs without any distress noted, assisted by security. Security in possession of all of patient's belongings.

## 2010-08-22 NOTE — ED Notes (Signed)
Awaiting bed assignment. Pt resting quietly on the stretcher, no signs of distress noted. Pt aware of plan for admission, questions answered. Pt ambulatory to restroom with steady gait. Pt has tolerated all food and drink without difficulty.

## 2010-08-23 LAB — LEVETIRACETAM (KEPPRA): Levetiracetam (Keppra): <2 ug/mL — ABNORMAL LOW (ref 5–30)

## 2010-08-23 MED ADMIN — phenytoin ER (DILANTIN ER) ER capsule 100 mg: ORAL | @ 13:00:00 | NDC 00071036940

## 2010-08-23 MED ADMIN — clonazePAM (KLONOPIN) tablet 1 mg: ORAL | @ 03:00:00 | NDC 00904610261

## 2010-08-23 MED ADMIN — clonazePAM (KLONOPIN) tablet 1 mg: ORAL | @ 17:00:00 | NDC 00904610261

## 2010-08-23 MED ADMIN — FLUoxetine (PROZAC) capsule 40 mg: ORAL | @ 14:00:00 | NDC 00093435605

## 2010-08-23 MED ADMIN — lamoTRIgine (LAMICTAL) tablet 100 mg: ORAL | @ 13:00:00 | NDC 68084031911

## 2010-08-23 MED ADMIN — lamoTRIgine (LAMICTAL) tablet 100 mg: ORAL | @ 23:00:00 | NDC 68084031911

## 2010-08-23 MED ADMIN — hydrochlorothiazide (HYDRODIURIL) tablet 25 mg: ORAL | @ 13:00:00 | NDC 68084008611

## 2010-08-23 MED ADMIN — clonazePAM (KLONOPIN) tablet 1 mg: ORAL | @ 13:00:00 | NDC 00904610261

## 2010-08-23 MED ADMIN — levETIRAcetam (KEPPRA) tablet 250 mg: ORAL | @ 23:00:00 | NDC 50268046715

## 2010-08-23 MED ADMIN — lovastatin (MEVACOR) tablet 20 mg: ORAL | @ 03:00:00 | NDC 00093057619

## 2010-08-23 MED ADMIN — phenytoin ER (DILANTIN ER) ER capsule 100 mg: ORAL | @ 22:00:00 | NDC 00071036940

## 2010-08-23 MED ADMIN — levETIRAcetam (KEPPRA) tablet 250 mg: ORAL | @ 13:00:00 | NDC 51079082001

## 2010-08-23 MED ADMIN — clonazePAM (KLONOPIN) tablet 1 mg: ORAL | NDC 00904610261

## 2010-08-23 MED ADMIN — phenytoin ER (DILANTIN ER) ER capsule 100 mg: ORAL | @ 03:00:00 | NDC 00071036940

## 2010-08-23 MED FILL — FLUOXETINE 20 MG CAP: 20 mg | ORAL | Qty: 2

## 2010-08-23 MED FILL — CLONAZEPAM 1 MG TAB: 1 mg | ORAL | Qty: 1

## 2010-08-23 MED FILL — HYDROCHLOROTHIAZIDE 25 MG TAB: 25 mg | ORAL | Qty: 1

## 2010-08-23 MED FILL — DILANTIN EXTENDED 100 MG CAPSULE: 100 mg | ORAL | Qty: 1

## 2010-08-23 MED FILL — LAMOTRIGINE 100 MG TAB: 100 mg | ORAL | Qty: 1

## 2010-08-23 MED FILL — LEVETIRACETAM 250 MG TAB: 250 mg | ORAL | Qty: 1

## 2010-08-23 NOTE — Behavioral Health Treatment Team (Signed)
PRN trazadone administered per pt request.
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
## 2010-08-23 NOTE — Behavioral Health Treatment Team (Signed)
Pt approaches nurses station and is requesting to have his room changed. Pt states " I just don't feel comfortable with my room mate now after having a earlier conversation with him". " If I can't have my rooms changed then I want to sign out". Staff explained to pt that there weren't any beds available on general to change him. Pt is requesting to have staff check rooms on acute side if needed. Staff will contact dr on call to see if pt can change units. Will continue to monitor pt.

## 2010-08-23 NOTE — Behavioral Health Treatment Team (Signed)
Pt requesting new roommate. Pt reports roommate's conversation makes him feel uncomfortable and brings up unwanted feelings.

## 2010-08-23 NOTE — Behavioral Health Treatment Team (Signed)
GROUP THERAPY PROGRESS NOTE    [name] is participating in Psychiatric.     Group time: 30 minutes    Personal goal for participation: be less anxious and get my meds straight    Goal orientation: personal    Group therapy participation: active    Therapeutic interventions reviewed and discussed: coping skills and Discharge plans    Impression of participation: active

## 2010-08-23 NOTE — Behavioral Health Treatment Team (Signed)
Pt requested information regarding medications he is taking. Drug information sheets from [employer] printed for his medications with the exception of tylenol (no information sheet was provided for this medication) and dilantin (information sheet was provided from [employer]).

## 2010-08-23 NOTE — Behavioral Health Treatment Team (Signed)
Pt has been hyper-focusing on discharge. Pt thinks he will be discharged on Monday. Pt wants to be discharged on Thursday.  [facility] and does not want to be discharged prior to that time.  Pt told to express his concerns regarding discharge to MD. Pt stated, "Oh, I WILL tell Dr. Quentin Mulling."     Later during the evening, pt asked what was his plan of care. He wanted to know what goals had to be reached before he was to be considered for discharge. Pt asked if he needed to be free of homicidal and/or suicidal intentions prior to discharge. Pt stated with a smile on his face, "Just like I tell my patients.  Now I understand."

## 2010-08-23 NOTE — Behavioral Health Treatment Team (Signed)
PRN clonazepam administered per pt request for anxiety.
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
## 2010-08-23 NOTE — Behavioral Health Treatment Team (Signed)
After administration of trazadone, pt stated, "I hope this works like it used to and I don't have an adverse effect." RN asked what was the adverse effect, and pt stated, "I was up all night." Pt stated, "if that happens, I will be requested thorazine." Pt then laughed and left med window.

## 2010-08-23 NOTE — Progress Notes (Signed)
The patient complained of anxiety.   Also experiencing some sadness. No feelings of hopelessness.  No  Florid psychosis.  Alert and oriented.  Cordial.  Speech is clear and coherent. No concerns about medication and will continue the same.

## 2010-08-23 NOTE — Behavioral Health Treatment Team (Cosign Needed)
GROUP THERAPY PROGRESS NOTE    [name] is participating in [place].     Group time: 30 minutes    Personal goal for participation: "No negative thoughts today"    Goal orientation: personal    Group therapy participation: active    Therapeutic interventions reviewed and discussed: reviewed unit schedule and rules    Impression of participation: Required prompting to attend and participate; reported he does not like to share feelings in a large group; reported he is " not ready to go home".

## 2010-08-23 NOTE — Behavioral Health Treatment Team (Cosign Needed)
GROUP THERAPY PROGRESS NOTE    [name] is participating in Reflections Group.     Group time: 15 minutes    Personal goal for participation: Daily Progress    Goal orientation: personal    Group therapy participation: active    Therapeutic interventions reviewed and discussed:     Impression of participation: Patient feels that he will be ready for discharge from the hospital on Wednesday or Thursday of next week.

## 2010-08-23 NOTE — Behavioral Health Treatment Team (Signed)
Pt. Wants to be consistent with plans for treatment when he leaves the unit. Pt. talked about having thought about suicide but made no real plan.Pt. Very talkative giving feedback to another resident.

## 2010-08-24 MED ADMIN — lamoTRIgine (LAMICTAL) tablet 100 mg: ORAL | @ 22:00:00 | NDC 68084031911

## 2010-08-24 MED ADMIN — clonazePAM (KLONOPIN) tablet 1 mg: ORAL | @ 21:00:00 | NDC 00904610261

## 2010-08-24 MED ADMIN — levETIRAcetam (KEPPRA) tablet 250 mg: ORAL | @ 13:00:00 | NDC 50268046715

## 2010-08-24 MED ADMIN — clonazePAM (KLONOPIN) tablet 1 mg: ORAL | @ 13:00:00 | NDC 00904610261

## 2010-08-24 MED ADMIN — lamoTRIgine (LAMICTAL) tablet 100 mg: ORAL | @ 13:00:00 | NDC 68084031911

## 2010-08-24 MED ADMIN — FLUoxetine (PROZAC) capsule 40 mg: ORAL | @ 13:00:00 | NDC 00904578561

## 2010-08-24 MED ADMIN — phenytoin ER (DILANTIN ER) ER capsule 100 mg: ORAL | @ 02:00:00 | NDC 00071036940

## 2010-08-24 MED ADMIN — phenytoin ER (DILANTIN ER) ER capsule 100 mg: ORAL | @ 21:00:00 | NDC 00071036940

## 2010-08-24 MED ADMIN — traZODone (DESYREL) tablet 100 mg: ORAL | @ 03:00:00 | NDC 00182126000

## 2010-08-24 MED ADMIN — phenytoin ER (DILANTIN ER) ER capsule 100 mg: ORAL | @ 13:00:00 | NDC 00071036940

## 2010-08-24 MED ADMIN — lovastatin (MEVACOR) tablet 20 mg: ORAL | @ 02:00:00 | NDC 00093057619

## 2010-08-24 MED ADMIN — hydrochlorothiazide (HYDRODIURIL) tablet 25 mg: ORAL | @ 13:00:00 | NDC 68084008611

## 2010-08-24 MED ADMIN — levETIRAcetam (KEPPRA) tablet 250 mg: ORAL | @ 22:00:00 | NDC 50268046715

## 2010-08-24 MED FILL — CLONAZEPAM 1 MG TAB: 1 mg | ORAL | Qty: 1

## 2010-08-24 MED FILL — LOVASTATIN 20 MG TAB: 20 mg | ORAL | Qty: 1

## 2010-08-24 MED FILL — DILANTIN EXTENDED 100 MG CAPSULE: 100 mg | ORAL | Qty: 1

## 2010-08-24 MED FILL — HYDROCHLOROTHIAZIDE 25 MG TAB: 25 mg | ORAL | Qty: 1

## 2010-08-24 MED FILL — TRAZODONE 100 MG TAB: 100 mg | ORAL | Qty: 1

## 2010-08-24 MED FILL — LAMOTRIGINE 100 MG TAB: 100 mg | ORAL | Qty: 1

## 2010-08-24 MED FILL — LEVETIRACETAM 250 MG TAB: 250 mg | ORAL | Qty: 1

## 2010-08-24 MED FILL — FLUOXETINE 20 MG CAP: 20 mg | ORAL | Qty: 2

## 2010-08-24 NOTE — Progress Notes (Signed)
The patient has no acute symptoms. Alert and oriented.  Rather pleasant this morning.  Speech is clear and coherent. No suicidal or homicidal ideation.  Overall  Decrease in anxiety. Continue current treatment regimen.

## 2010-08-24 NOTE — Behavioral Health Treatment Team (Signed)
GROUP THERAPY PROGRESS NOTE    [name] is participating in D/C Planning: Coping with Fear    Group time: 45 minutes    Personal goal for participation: Fearful of relapse    Goal orientation: personal    Group therapy participation: active    Therapeutic interventions reviewed and discussed: Yes    Impression of participation: Pt. acknowledged fearing relapse thus recycling back into his mania

## 2010-08-24 NOTE — Behavioral Health Treatment Team (Cosign Needed)
Patient declined to attend nursing education/coping skills group.  He returned to bed after breakfast.

## 2010-08-24 NOTE — Behavioral Health Treatment Team (Signed)
PRN clonazepam administered for anxiety per pt. Request.

## 2010-08-24 NOTE — Behavioral Health Treatment Team (Cosign Needed)
GROUP THERAPY PROGRESS NOTE    [name] is participating in [place].     Group time: 30 minutes    Personal goal for participation: "Take a shower"    Goal orientation: personal    Group therapy participation: minimal    Therapeutic interventions reviewed and discussed: reviewed unit schedule and rules    Impression of participation: Patient declined to attend community, returned to bed after breakfast. Patient made his goal during our 1:1 interaction.

## 2010-08-24 NOTE — Progress Notes (Signed)
Problem: Depressed Mood (Adult)  Goal: *STG: Attends activities and groups  Pt. Only comes for meds and meals.  Is sarcastic and attention seeking.  Not vested in treatment

## 2010-08-24 NOTE — Behavioral Health Treatment Team (Signed)
GROUP THERAPY PROGRESS NOTE    [name] is participating in Reflections.     Group time: 20 minutes    Personal goal for participation: none noted    Goal orientation: personal    Group therapy participation: active    Therapeutic interventions reviewed and discussed: goals    Impression of participation: good

## 2010-08-24 NOTE — Behavioral Health Treatment Team (Signed)
Pt has been medication and meal compliant this morning.

## 2010-08-24 NOTE — Behavioral Health Treatment Team (Signed)
Patient did not attend psychotherapy group

## 2010-08-24 NOTE — Behavioral Health Treatment Team (Signed)
Alert and oriented x3, vitals stable, wnl. Denies SI, yet states" I am still very depressed and anxious, and the Klonopin isnt working." Patient directed to speak with his doctor in the morning concerning a medication adjustment. Was given his scheduled dose of Klonopin. Medication and meal complaint. Will continue to monitor on Q 15 minute safety checks.

## 2010-08-25 MED ORDER — HYDROCHLOROTHIAZIDE 25 MG TAB
25 mg | ORAL_TABLET | Freq: Every day | ORAL | Status: DC
Start: 2010-08-25 — End: 2010-10-06

## 2010-08-25 MED ORDER — LAMOTRIGINE 100 MG TAB
100 mg | ORAL_TABLET | Freq: Two times a day (BID) | ORAL | Status: DC
Start: 2010-08-25 — End: 2010-10-06

## 2010-08-25 MED ORDER — LEVETIRACETAM 250 MG TAB
250 mg | ORAL_TABLET | Freq: Two times a day (BID) | ORAL | Status: DC
Start: 2010-08-25 — End: 2010-09-30

## 2010-08-25 MED ORDER — PHENYTOIN SODIUM EXTENDED 100 MG CAP
100 mg | ORAL_CAPSULE | Freq: Three times a day (TID) | ORAL | Status: DC
Start: 2010-08-25 — End: 2010-10-06

## 2010-08-25 MED ORDER — LOVASTATIN 20 MG TAB
20 mg | ORAL_TABLET | Freq: Every evening | ORAL | Status: DC
Start: 2010-08-25 — End: 2010-10-06

## 2010-08-25 MED ORDER — TRAZODONE 100 MG TAB
100 mg | ORAL_TABLET | Freq: Every evening | ORAL | Status: DC | PRN
Start: 2010-08-25 — End: 2010-10-06

## 2010-08-25 MED ORDER — FLUOXETINE 40 MG CAP
40 mg | ORAL_CAPSULE | Freq: Every day | ORAL | Status: DC
Start: 2010-08-25 — End: 2010-10-06

## 2010-08-25 MED ORDER — CLONAZEPAM 1 MG TAB
1 mg | ORAL_TABLET | Freq: Three times a day (TID) | ORAL | Status: DC | PRN
Start: 2010-08-25 — End: 2010-10-06

## 2010-08-25 MED ADMIN — clonazePAM (KLONOPIN) tablet 1 mg: ORAL | @ 02:00:00 | NDC 00904610261

## 2010-08-25 MED ADMIN — phenytoin ER (DILANTIN ER) ER capsule 100 mg: ORAL | @ 13:00:00 | NDC 00071036940

## 2010-08-25 MED ADMIN — levETIRAcetam (KEPPRA) tablet 250 mg: ORAL | @ 13:00:00 | NDC 50268046715

## 2010-08-25 MED ADMIN — FLUoxetine (PROZAC) capsule 40 mg: ORAL | @ 13:00:00 | NDC 00904578561

## 2010-08-25 MED ADMIN — lovastatin (MEVACOR) tablet 20 mg: ORAL | @ 02:00:00 | NDC 00093057619

## 2010-08-25 MED ADMIN — clonazePAM (KLONOPIN) tablet 1 mg: ORAL | @ 15:00:00 | NDC 00904610261

## 2010-08-25 MED ADMIN — nicotine (NICORETTE) gum 2 mg: ORAL | @ 03:00:00 | NDC 00904573451

## 2010-08-25 MED ADMIN — hydrochlorothiazide (HYDRODIURIL) tablet 25 mg: ORAL | @ 13:00:00 | NDC 68084008611

## 2010-08-25 MED ADMIN — clonazePAM (KLONOPIN) tablet 1 mg: ORAL | @ 14:00:00 | NDC 00904610261

## 2010-08-25 MED ADMIN — clonazePAM (KLONOPIN) tablet 1 mg: ORAL | @ 12:00:00 | NDC 00904610261

## 2010-08-25 MED ADMIN — lamoTRIgine (LAMICTAL) tablet 100 mg: ORAL | @ 13:00:00 | NDC 68084031911

## 2010-08-25 MED ADMIN — phenytoin ER (DILANTIN ER) ER capsule 100 mg: ORAL | @ 02:00:00 | NDC 00071036940

## 2010-08-25 MED ADMIN — traZODone (DESYREL) tablet 100 mg: ORAL | @ 02:00:00 | NDC 00182126000

## 2010-08-25 MED FILL — LAMOTRIGINE 100 MG TAB: 100 mg | ORAL | Qty: 1

## 2010-08-25 MED FILL — NICORELIEF 2 MG GUM: 2 mg | BUCCAL | Qty: 1

## 2010-08-25 MED FILL — DILANTIN EXTENDED 100 MG CAPSULE: 100 mg | ORAL | Qty: 1

## 2010-08-25 MED FILL — CLONAZEPAM 1 MG TAB: 1 mg | ORAL | Qty: 1

## 2010-08-25 MED FILL — LOVASTATIN 20 MG TAB: 20 mg | ORAL | Qty: 1

## 2010-08-25 MED FILL — HYDROCHLOROTHIAZIDE 25 MG TAB: 25 mg | ORAL | Qty: 1

## 2010-08-25 MED FILL — TRAZODONE 100 MG TAB: 100 mg | ORAL | Qty: 1

## 2010-08-25 MED FILL — FLUOXETINE 20 MG CAP: 20 mg | ORAL | Qty: 2

## 2010-08-25 MED FILL — LEVETIRACETAM 250 MG TAB: 250 mg | ORAL | Qty: 1

## 2010-08-25 NOTE — Progress Notes (Signed)
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
## 2010-08-25 NOTE — Progress Notes (Addendum)
DISCHARGE SUMMARY    NAME:[name]  DOB: February 25, 1986  MRN: [id number]    The patient [name] exhibits the ability to control behavior in a less restrictive environment.  Patient's level of functioning is improving.  No assaultive/destructive behavior has been observed for the past 24 hours.  No suicidal/homicidal threat or behavior has been observed for the past 24 hours.  There is no evidence of serious medication side effects.  Patient has not been in physical or protective restraints for at least the past 24 hours.    If weapons involved, how are they secured? n/a    Is patient aware of and in agreement with discharge plan? yes    Arrangements for medication:  Prescriptions given to patient, given a 30 day supply.     Copy of discharge instructions to  provider?:  Faxed    Arrangements for transportation home:  Cab ticket to todays apts provided     Mental health crisis number:  911 or your local mental health crisis line number at  [facility]  [phone]  [phone]     After-care Provider:  Dr. Guinevere Scarlet   Provider phone #: [phone]   Appt. date and time: Mon. 08/25/10 at 1:30pm   Appt. Location:  [address] [place], [place]   Transportation to appt: [employer] provided    Please see the Clinical research associate (Dr. Quentin Mulling) today  Please take your medications  Patient's Medications   Start Taking    No medications on file   Continue Taking    No medications on file   These Medications have changed    Modified Medication Previous Medication    CLONAZEPAM (KLONOPIN) 1 MG TABLET clonazePAM (KLONOPIN) 1 mg tablet       Take 1 Tab by mouth three (3) times daily as needed.    Take 1 mg by mouth three (3) times daily as needed.    FLUOXETINE (PROZAC) 40 MG CAPSULE FLUoxetine (PROZAC) 40 mg capsule       Take 1 Cap by mouth daily. Indications: DEPRESSION    Take 40 mg by mouth daily. Indications: DEPRESSION    HYDROCHLOROTHIAZIDE (HYDRODIURIL) 25 MG TABLET hydrochlorothiazide (HYDRODIURIL) 25 mg tablet        Take 1 Tab by mouth daily.    Take 25 mg by mouth.    LAMOTRIGINE (LAMICTAL) 100 MG TABLET lamoTRIgine (LAMICTAL) 100 mg tablet       Take 1 Tab by mouth two (2) times a day.    Take 100 mg by mouth two (2) times a day.    LEVETIRACETAM (KEPPRA) 250 MG TABLET levETIRAcetam (KEPPRA) 250 mg tablet       Take 1 Tab by mouth two (2) times a day.    Take 250 mg by mouth two (2) times a day.    LOVASTATIN (MEVACOR) 20 MG TABLET lovastatin (MEVACOR) 20 mg tablet       Take 1 Tab by mouth nightly. Indications: HYPERCHOLESTEROLEMIA    Take 20 mg by mouth nightly. Indications: HYPERCHOLESTEROLEMIA    PHENYTOIN ER (DILANTIN EXTENDED) 100 MG ER CAPSULE phenytoin ER (DILANTIN EXTENDED) 100 mg ER capsule       Take 1 Cap by mouth three (3) times daily. Indications: EPILEPSY    Take 100 mg by mouth three (3) times daily. Indications: EPILEPSY    TRAZODONE (DESYREL) 100 MG TABLET traZODone (DESYREL) 100 mg tablet       Take 1 Tab by mouth nightly as needed. Indications: INSOMNIA  Take 100 mg by mouth nightly as needed. Indications: INSOMNIA   Stop Taking    No medications on file     Woodward Ku, MD

## 2010-08-25 NOTE — Progress Notes (Signed)
Problem: Depressed Mood (Adult)  Goal: *STG: Participates in treatment plan  Outcome: Progressing Towards Goal  Reviewed meds, discussed his d/c plans with follow up w Dr. Quentin Mulling this afternoon. Denies SI, insight into his need to follow up w psychiatrist and nerologist.   Goal: Interventions  Outcome: Progressing Towards Goal  Staff is following plans of care

## 2010-08-25 NOTE — Behavioral Health Treatment Team (Signed)
Received patient in bed appeared to be asleep, maintained on Q 15 minute checks for his safety.
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
## 2010-08-30 ENCOUNTER — Inpatient Hospital Stay
Admit: 2010-08-30 | Discharge: 2010-09-01 | Discharge status: Against Medical Advice | Payer: MEDICARE | Attending: Psychiatry | Admitting: Psychiatry

## 2010-08-30 DIAGNOSIS — F319 Bipolar disorder, unspecified: Secondary | ICD-10-CM

## 2010-08-30 LAB — URINALYSIS W/ REFLEX CULTURE
Bacteria: NEGATIVE /HPF
Blood: NEGATIVE
Glucose: NEGATIVE MG/DL
Leukocyte Esterase: NEGATIVE
Nitrites: NEGATIVE
Protein: NEGATIVE MG/DL
Specific gravity: >1.03 — ABNORMAL HIGH (ref 1.003–1.030)
Urobilinogen: 1 EU/DL (ref 0.2–1.0)
pH (UA): 5.5 (ref 5.0–8.0)

## 2010-08-30 LAB — METABOLIC PANEL, COMPREHENSIVE
A-G Ratio: 1.3 (ref 1.1–2.2)
ALT (SGPT): 34 U/L (ref 12–78)
AST (SGOT): 19 U/L (ref 15–37)
Albumin: 4.8 g/dL (ref 3.5–5.0)
Alk. phosphatase: 92 U/L (ref 50–136)
Anion gap: 12 mmol/L (ref 5–15)
BUN/Creatinine ratio: 15 (ref 12–20)
BUN: 15 MG/DL (ref 6–20)
Bilirubin, total: 0.3 MG/DL (ref 0.2–1.0)
CO2: 24 MMOL/L (ref 21–32)
Calcium: 9 MG/DL (ref 8.5–10.1)
Chloride: 102 MMOL/L (ref 97–108)
Creatinine: 1 MG/DL (ref 0.6–1.3)
GFR est AA: >60 mL/min/{1.73_m2} (ref >60)
GFR est non-AA: >60 mL/min/{1.73_m2} (ref >60)
Globulin: 3.8 g/dL (ref 2.0–4.0)
Glucose: 101 MG/DL — ABNORMAL HIGH (ref 65–100)
Potassium: 3.8 MMOL/L (ref 3.5–5.1)
Protein, total: 8.6 g/dL — ABNORMAL HIGH (ref 6.4–8.2)
Sodium: 138 MMOL/L (ref 136–145)

## 2010-08-30 LAB — DRUG SCREEN, URINE
AMPHETAMINES: NEGATIVE
BARBITURATES: POSITIVE — AB
BENZODIAZEPINES: POSITIVE — AB
COCAINE: POSITIVE — AB
METHADONE: NEGATIVE
OPIATES: NEGATIVE
PCP(PHENCYCLIDINE): NEGATIVE
THC (TH-CANNABINOL): POSITIVE — AB

## 2010-08-30 LAB — PHENYTOIN: Phenytoin: 9.3 ug/mL — ABNORMAL LOW (ref 10.0–20.0)

## 2010-08-30 LAB — CBC WITH AUTOMATED DIFF
ABS. BASOPHILS: 0 10*3/uL (ref 0.0–0.1)
ABS. EOSINOPHILS: 0.2 10*3/uL (ref 0.0–0.4)
ABS. LYMPHOCYTES: 3.4 10*3/uL (ref 0.8–3.5)
ABS. MONOCYTES: 0.8 10*3/uL (ref 0.0–1.0)
ABS. NEUTROPHILS: 5.4 10*3/uL (ref 1.8–8.0)
BASOPHILS: 0 % (ref 0–1)
EOSINOPHILS: 2 % (ref 0–7)
HCT: 44.9 % (ref 36.6–50.3)
HGB: 15.2 g/dL (ref 12.1–17.0)
LYMPHOCYTES: 35 % (ref 12–49)
MCH: 30.5 PG (ref 26.0–34.0)
MCHC: 33.9 g/dL (ref 30.0–36.5)
MCV: 90 FL (ref 80.0–99.0)
MONOCYTES: 8 % (ref 5–13)
NEUTROPHILS: 55 % (ref 32–75)
PLATELET: 247 10*3/uL (ref 150–400)
RBC: 4.99 M/uL (ref 4.10–5.70)
RDW: 13.6 % (ref 11.5–14.5)
WBC: 9.8 10*3/uL (ref 4.1–11.1)

## 2010-08-30 LAB — ETHYL ALCOHOL: ALCOHOL(ETHYL),SERUM: <10 MG/DL (ref <10)

## 2010-08-30 LAB — BILIRUBIN, CONFIRM: Bilirubin UA, confirm: NEGATIVE

## 2010-08-30 MED ADMIN — phenytoin ER (DILANTIN ER) ER capsule 100 mg: ORAL | @ 22:00:00 | NDC 00071036940

## 2010-08-30 MED ADMIN — levETIRAcetam (KEPPRA) tablet 250 mg: ORAL | @ 15:00:00 | NDC 68084033611

## 2010-08-30 MED ADMIN — levETIRAcetam (KEPPRA) tablet 250 mg: ORAL | @ 22:00:00 | NDC 50268046715

## 2010-08-30 MED ADMIN — FLUoxetine (PROZAC) capsule 40 mg: ORAL | @ 15:00:00 | NDC 00904578561

## 2010-08-30 MED ADMIN — hydrochlorothiazide (HYDRODIURIL) tablet 25 mg: ORAL | @ 15:00:00 | NDC 68084008611

## 2010-08-30 MED ADMIN — clonazePAM (KLONOPIN) tablet 1 mg: ORAL | @ 17:00:00 | NDC 00904610261

## 2010-08-30 MED ADMIN — lovastatin (MEVACOR) tablet 20 mg: ORAL | @ 10:00:00 | NDC 71205099390

## 2010-08-30 MED ADMIN — phenytoin ER (DILANTIN ER) ER capsule 100 mg: ORAL | @ 15:00:00 | NDC 00071036940

## 2010-08-30 MED ADMIN — clonazePAM (KLONOPIN) tablet 1 mg: ORAL | NDC 00093083319

## 2010-08-30 MED ADMIN — lamoTRIgine (LAMICTAL) tablet 100 mg: ORAL | @ 22:00:00 | NDC 68084031911

## 2010-08-30 MED ADMIN — clonazePAM (KLONOPIN) tablet 1 mg: ORAL | @ 11:00:00 | NDC 72888015330

## 2010-08-30 MED ADMIN — lamoTRIgine (LAMICTAL) tablet 100 mg: ORAL | @ 15:00:00 | NDC 68084031911

## 2010-08-30 MED FILL — LEVETIRACETAM 250 MG TAB: 250 mg | ORAL | Qty: 1

## 2010-08-30 MED FILL — DILANTIN EXTENDED 100 MG CAPSULE: 100 mg | ORAL | Qty: 1

## 2010-08-30 MED FILL — CLONAZEPAM 1 MG TAB: 1 mg | ORAL | Qty: 1

## 2010-08-30 MED FILL — FLUOXETINE 20 MG CAP: 20 mg | ORAL | Qty: 2

## 2010-08-30 MED FILL — LAMOTRIGINE 100 MG TAB: 100 mg | ORAL | Qty: 1

## 2010-08-30 MED FILL — HYDROCHLOROTHIAZIDE 25 MG TAB: 25 mg | ORAL | Qty: 1

## 2010-08-30 NOTE — ED Notes (Signed)
Triage note: Patient arrives from home reporting "my sister said that I had a seizure. I have a very bad headache. I was just discharged from the 7th floor last week. I don't feel suicidal but the depression is getting worse." Reports HA on right side.

## 2010-08-30 NOTE — ED Provider Notes (Signed)
HPI Comments: This is a 24 y.o.male w/ PMHx of seizures, bipolar disorder, and anxiety who presents to the ED secondary to depression. Pt discharged from [facility] last week after admitted for depression and suicidal ideations, with instructions to return to ED with increased depression. Pt had suicidal ideations yesterday but not today. No homicidal ideations or hallucinations. Pt reports that according to sister he had a seizure earlier tonight, now c/o of headache (7/10).No fever, rash, blurred vision, lightheadedness, chest pain, SOB, back pain, abd pain, n/v/d/c, urinary symptoms, extremity numbness or weakness, leg swelling or pain. Last breakthrough seizure was 1 wk ago. Pt is compliant with dilantin, keppra, and lamictal. No recent changes in medication. Pt is smoker, denies recent alcohol and illicit drug use.   Additional PMHx: hypercholesterolemia, HTN, TBI  PSHx: lobectomy and skull reconstruction.  Note written by Doug Sou, Scribe, as dictated by Loni Beckwith, MD 1:51 AM        The history is provided by the patient.        Past Medical History   Diagnosis Date   ??? Hypercholesteremia    ??? Anxiety    ??? Bipolar affective    ??? TBI (traumatic brain injury)    ??? Optic nerve trauma      right   ??? Seizures    ??? Hypertension    ??? Depression    ??? Anxiety    ??? Bipolar 2 disorder    ??? Psychiatric disorder         Past Surgical History   Procedure Date   ??? Sinus surgery proc unlisted    ??? Hx orthopaedic      right elbow growth plate fracture   ??? Hx lobectomy      right frontal   ??? Neurological procedure unlisted      reconstruction of skulll         Family History   Problem Relation Age of Onset   ??? Cancer Father         History     Social History   ??? Marital Status: Single     Spouse Name: N/A     Number of Children: N/A   ??? Years of Education: N/A     Occupational History   ??? Not on file.     Social History Main Topics   ??? Smoking status: Current Everyday Smoker -- 1.0 packs/day for 16 years    ??? Smokeless tobacco: Never Used   ??? Alcohol Use: Yes      ocassion   ??? Drug Use: Yes     Special: Marijuana, Cocaine, Opiates, Heroin   ??? Sexually Active: No     Other Topics Concern   ??? Not on file     Social History Narrative   ??? No narrative on file                  ALLERGIES: Iodine; Fish product derivatives; and Shellfish      Review of Systems   Constitutional: Negative for fever and appetite change.   HENT: Negative for nosebleeds, congestion and sore throat.    Eyes: Negative for discharge.   Respiratory: Negative for cough and shortness of breath.    Cardiovascular: Negative for chest pain.   Gastrointestinal: Negative for nausea, vomiting, abdominal pain and diarrhea.   Genitourinary: Negative for dysuria.   Musculoskeletal: Negative.    Skin: Negative for rash.   Neurological: Positive for seizures and headaches. Negative for weakness.  Hematological: Negative for adenopathy.   Psychiatric/Behavioral: Positive for dysphoric mood.   [all other systems reviewed and are negative        Filed Vitals:    08/30/10 0034   BP: 134/87   Pulse: 71   Temp: 97.6 ??F (36.4 ??C)   Resp: 18   Height: 5\' 10"  (1.778 m)   Weight: 243 lb (110.224 kg)   SpO2: 97%            Physical Exam     CONSTITUTIONAL: Well-appearing; well-nourished; in no apparent distress  HEAD: Normocephalic; atraumatic  EYES: PERRL; EOM intact; conjunctiva and sclera are clear bilaterally.  ENT: No rhinorrhea; normal pharynx with no tonsillar hypertrophy; mucous membranes pink/moist, no erythema, no exudate.  NECK: Supple; non-tender; no cervical lymphadenopathy  CARD: Normal S1, S2; no murmurs, rubs, or gallops. Regular rate and rhythm.  RESP: Normal respiratory effort; breath sounds clear and equal bilaterally; no wheezes, rhonchi, or rales.  ABD: Normal bowel sounds; non-distended; non-tender; no palpable organomegaly, no masses, no bruits.  Back Exam: Normal inspection; no vertebral point tenderness, no CVA tenderness. Normal range of motion.   EXT: Normal ROM in all four extremities; non-tender to palpation; no swelling or deformity; distal pulses are normal, no edema.  SKIN: Warm; dry; no rash.  NEURO:Alert and oriented x 3, coherent, NII-XII grossly intact, sensory and motor are non-focal.    MDM     Differential Diagnosis; Clinical Impression; Plan:     [A: Seizures with Bipolar disorder/suicide ideation/History of substance abuse  P: Labs/BSMRT evaluation for psych admission.  Amount and/or Complexity of Data Reviewed:   Clinical lab tests:  [ordered and reviewed  Tests in the medicine section of the CPT??:  [reviewed and ordered   Decide to obtain previous medical records or to obtain history from someone other than the patient:  Yes   Obtain history from someone other than the patient:  Yes   Review and summarize past medical records:  Yes   Independant visualization of image, tracing, or specimen:  Yes  Risk of Significant Complications, Morbidity, and/or Mortality:   Presenting problems:  [moderate  Diagnostic procedures:  [moderate  Management options:  [moderate  Progress:   Patient progress:  [stable      Procedures    PROGRESS NOTE:  Pt has been reexamined by Woodward Ku, MD all available results have been reviewed with pt and any available family. Pt understands sx, dx, and tx in ED. Care plan has been outlined and questions have been answered. Pt and any available family understands and agrees to need for admission to hospital for further tx not available in ED. Pt is ready for admission.    Written by Kathrene Alu, MD,  4:15 AM    .

## 2010-08-30 NOTE — Behavioral Health Treatment Team (Signed)
GROUP THERAPY PROGRESS NOTE    [name] is participating in nursing education group.     Group time: 30 minutes    Personal goal for participation: medication teaching    Goal orientation: personal    Group therapy participation: active    Therapeutic interventions reviewed and discussed: yes    Impression of participation: receptive to teaching.

## 2010-08-30 NOTE — Behavioral Health Treatment Team (Addendum)
GROUP THERAPY PROGRESS NOTE    [name] is participating in Reflections Group.     Group time: 20 minutes    Personal goal for participation: participation    Goal orientation: personal    Group therapy participation: active    Therapeutic interventions reviewed and discussed: yes    Impression of participation: good

## 2010-08-30 NOTE — ED Notes (Signed)
Pt to restroom after being given a soda and foot socks. HPD to transport patient upstairs momentarily with all belongings and discharge paperwork.

## 2010-08-30 NOTE — Behavioral Health Treatment Team (Signed)
25 year old male pt. Of Dr. Quentin Mulling admitted for depression. Pt. Reports he feels his depression is getting worse. Pt. Contracts for safety. Admission orders received from Dr. Luster Landsberg. Pt. Calm and cooperative during admission process. Skin assessmet done and old surgical circular scar noted on forehead and old surgical scar noted on right arm. Pt. Placed on . Checks for safety and seizure precautions due to history of seizures.

## 2010-08-30 NOTE — ED Notes (Signed)
Pt resting quietly on the stretcher. No signs of distress noted. Pt alert, oriented, able to converse without any difficulty. Attempted to call report, RN to return call.

## 2010-08-30 NOTE — ED Notes (Signed)
Pt to restroom with steady gait. Pt states he moved his bowels. Pt changed into gown, belongings secured at the nurses' station. No signs of distress noted. Pt connected to the monitor X2. Pt cooperative, attentive.
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
## 2010-08-30 NOTE — Progress Notes (Signed)
Problem: Depressed Mood (Adult)  Goal: *STG: Verbalizes anger, guilt, and other feelings in a constructive manor  Outcome: Progressing Towards Goal  Pt's affect remains sad to depressed.  Isolative and withdrawn.  Denies any suicidal ideations.

## 2010-08-30 NOTE — Progress Notes (Signed)
TRANSFER - OUT REPORT:    Verbal report given to [facility] (name) on [name]  being transferred to 746 (unit) for routine progression of care       Report consisted of patient???s Situation, Background, Assessment and   Recommendations(SBAR).     Information from the following report(s) SBAR and ED Summary was reviewed with the receiving nurse.    Opportunity for questions and clarification was provided.

## 2010-08-30 NOTE — Behavioral Health Treatment Team (Signed)
Childlike,  Up finally for lunch.  Pleasant a times complains of being overwhelmed in the community, helpless and hopeless.

## 2010-08-30 NOTE — Behavioral Health Treatment Team (Signed)
Pt. Did not attend group this morning.

## 2010-08-30 NOTE — Progress Notes (Signed)
Klonopin 1 mg po prn for report of anxiety

## 2010-08-31 LAB — LAMOTRIGINE (LAMICTAL): LAMOTRIGINE: <0.9 ug/mL — ABNORMAL LOW (ref 3.0–14.0)

## 2010-08-31 LAB — LEVETIRACETAM (KEPPRA): Levetiracetam (Keppra): <2 ug/mL — ABNORMAL LOW (ref 5–30)

## 2010-08-31 MED ADMIN — phenytoin ER (DILANTIN ER) ER capsule 100 mg: ORAL | @ 21:00:00 | NDC 00071036940

## 2010-08-31 MED ADMIN — phenytoin ER (DILANTIN ER) ER capsule 100 mg: ORAL | @ 13:00:00 | NDC 00071036940

## 2010-08-31 MED ADMIN — lovastatin (MEVACOR) tablet 20 mg: ORAL | @ 02:00:00 | NDC 00093057619

## 2010-08-31 MED ADMIN — levETIRAcetam (KEPPRA) tablet 250 mg: ORAL | @ 13:00:00 | NDC 50268046715

## 2010-08-31 MED ADMIN — phenytoin ER (DILANTIN ER) ER capsule 100 mg: ORAL | @ 02:00:00 | NDC 00071036940

## 2010-08-31 MED ADMIN — clonazePAM (KLONOPIN) tablet 1 mg: ORAL | @ 12:00:00 | NDC 00904610261

## 2010-08-31 MED ADMIN — lamoTRIgine (LAMICTAL) tablet 100 mg: ORAL | @ 13:00:00 | NDC 68084031911

## 2010-08-31 MED ADMIN — levETIRAcetam (KEPPRA) tablet 250 mg: ORAL | @ 23:00:00 | NDC 50268046715

## 2010-08-31 MED ADMIN — FLUoxetine (PROZAC) capsule 40 mg: ORAL | @ 13:00:00 | NDC 00904578561

## 2010-08-31 MED ADMIN — clonazePAM (KLONOPIN) tablet 1 mg: ORAL | @ 21:00:00 | NDC 00904610261

## 2010-08-31 MED ADMIN — clonazePAM (KLONOPIN) tablet 1 mg: ORAL | @ 05:00:00 | NDC 00904610261

## 2010-08-31 MED ADMIN — hydrochlorothiazide (HYDRODIURIL) tablet 25 mg: ORAL | @ 13:00:00 | NDC 68084008611

## 2010-08-31 MED FILL — CLONAZEPAM 1 MG TAB: 1 mg | ORAL | Qty: 1

## 2010-08-31 MED FILL — HYDROCHLOROTHIAZIDE 25 MG TAB: 25 mg | ORAL | Qty: 1

## 2010-08-31 MED FILL — DILANTIN EXTENDED 100 MG CAPSULE: 100 mg | ORAL | Qty: 1

## 2010-08-31 MED FILL — LEVETIRACETAM 250 MG TAB: 250 mg | ORAL | Qty: 1

## 2010-08-31 MED FILL — FLUOXETINE 20 MG CAP: 20 mg | ORAL | Qty: 2

## 2010-08-31 MED FILL — LAMOTRIGINE 100 MG TAB: 100 mg | ORAL | Qty: 1

## 2010-08-31 MED FILL — LOVASTATIN 20 MG TAB: 20 mg | ORAL | Qty: 1

## 2010-08-31 NOTE — Behavioral Health Treatment Team (Signed)
Pt. Awake tearful and report  feeling very anxious about situations in his life. Medicated with Klonopin 1mg  po as per MD order . Pt. Refused Trazadone as order and states " Trazadone keeps me awake". 15 min. Checks maintained.

## 2010-08-31 NOTE — Behavioral Health Treatment Team (Signed)
Pt. Broke down and cried intensly. Pt. Did not stay in group, but left crying.

## 2010-08-31 NOTE — Progress Notes (Signed)
Problem: Depressed Mood (Adult)  Goal: *STG: Verbalizes anger, guilt, and other feelings in a constructive manor  Outcome: Progressing Towards Goal  Pt does remain irritable, intrusive and slightly demanding.  Less depression and denies suicidal ideations.

## 2010-08-31 NOTE — Behavioral Health Treatment Team (Cosign Needed)
GROUP THERAPY PROGRESS NOTE    [name] is participating in [place].     Group time: 30 minutes    Personal goal for participation: "Be real with self and others".    Goal orientation: personal    Group therapy participation: minimal    Therapeutic interventions reviewed and discussed: reviewed unit schedule & rules    Impression of participation: Required prompting to attend & participate in group

## 2010-08-31 NOTE — Behavioral Health Treatment Team (Signed)
Childlike, attempts to  Isolate in his room but was strongly encouraged to participate in unit activities which he did.  Minimizes reason for coming to the hospital.   Minimal interaction with peers.  Disheveled, complaint with meds .  Superficial with disclosure

## 2010-08-31 NOTE — Behavioral Health Treatment Team (Signed)
Alert and oriented x3, vitals stable, wnl. Denies SI, and HI. Medication and meal complaint. Will continue to monitor on Q 15 minute safety checks.

## 2010-08-31 NOTE — Behavioral Health Treatment Team (Signed)
GROUP THERAPY PROGRESS NOTE    [name] is participating in nursing education group.     Group time: 30 minutes    Personal goal for participation: medication indication and side effects.    Goal orientation: personal    Group therapy participation: active    Therapeutic interventions reviewed and discussed: yes    Impression of participation: receptive to teaching.

## 2010-08-31 NOTE — Progress Notes (Signed)
Patient complained of severe anxiety, frequent flashbacks, crying spells, poor sleep.  Denies AH/VH  Denies SI/HI.    Dx:   Bipolar disorder, depressed.   Cannabis abuse   Rule out PTSD    Plan:  Increase prozac to 60 mg  Increase lamictal to 100+150 mg  Klonopin standing dose
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
## 2010-08-31 NOTE — Behavioral Health Treatment Team (Addendum)
Patient was asleep within 30 minutes of taking medication.    Patient had difficulty going to sleep but has slept well with medication.

## 2010-09-01 MED ADMIN — lamoTRIgine (LAMICTAL) tablet 150 mg: ORAL | @ 02:00:00 | NDC 68084031911

## 2010-09-01 MED ADMIN — clonazePAM (KLONOPIN) tablet 1 mg: ORAL | @ 13:00:00 | NDC 00904610261

## 2010-09-01 MED ADMIN — FLUoxetine (PROZAC) capsule 60 mg: ORAL | @ 13:00:00 | NDC 00904578561

## 2010-09-01 MED ADMIN — phenytoin ER (DILANTIN ER) ER capsule 100 mg: ORAL | @ 02:00:00 | NDC 00071036940

## 2010-09-01 MED ADMIN — levETIRAcetam (KEPPRA) tablet 250 mg: ORAL | @ 13:00:00 | NDC 50268046715

## 2010-09-01 MED ADMIN — clonazePAM (KLONOPIN) tablet 1 mg: ORAL | @ 02:00:00 | NDC 00904610261

## 2010-09-01 MED ADMIN — phenytoin ER (DILANTIN ER) ER capsule 100 mg: ORAL | @ 13:00:00 | NDC 00071036940

## 2010-09-01 MED ADMIN — lovastatin (MEVACOR) tablet 20 mg: ORAL | @ 02:00:00 | NDC 00093057619

## 2010-09-01 MED ADMIN — hydrochlorothiazide (HYDRODIURIL) tablet 25 mg: ORAL | @ 13:00:00 | NDC 68084008611

## 2010-09-01 MED ADMIN — lamoTRIgine (LAMICTAL) tablet 100 mg: ORAL | @ 13:00:00 | NDC 68084031911

## 2010-09-01 MED FILL — LAMOTRIGINE 25 MG TAB: 25 mg | ORAL | Qty: 2

## 2010-09-01 MED FILL — FLUOXETINE 20 MG CAP: 20 mg | ORAL | Qty: 3

## 2010-09-01 MED FILL — LAMOTRIGINE 100 MG TAB: 100 mg | ORAL | Qty: 1

## 2010-09-01 MED FILL — DILANTIN EXTENDED 100 MG CAPSULE: 100 mg | ORAL | Qty: 1

## 2010-09-01 MED FILL — HYDROCHLOROTHIAZIDE 25 MG TAB: 25 mg | ORAL | Qty: 1

## 2010-09-01 MED FILL — LOVASTATIN 20 MG TAB: 20 mg | ORAL | Qty: 1

## 2010-09-01 MED FILL — CLONAZEPAM 1 MG TAB: 1 mg | ORAL | Qty: 1

## 2010-09-01 MED FILL — LEVETIRACETAM 250 MG TAB: 250 mg | ORAL | Qty: 1

## 2010-09-01 NOTE — Progress Notes (Signed)
Problem: Personality Disorder (Adult)  Goal: *STG: Participates in treatment plan  Outcome: Progressing Towards Goal  D/C AMA. Evaluated by [facility], pt states we do not offer him the 1:1 therapy or his specific issues.

## 2010-09-01 NOTE — Progress Notes (Signed)
DISCHARGE SUMMARY    NAME:Kenroy Guzzetta  DOB: [birth year]  MRN: [id number]    The patient Vicente Weidler exhibits the ability to control behavior in a less restrictive environment.  Patient's level of functioning is improving.  No assaultive/destructive behavior has been observed for the past 24 hours.  No suicidal/homicidal threat or behavior has been observed for the past 24 hours.  There is no evidence of serious medication side effects.  Patient has not been in physical or protective restraints for at least the past 24 hours.    If weapons involved, how are they secured? n/a    Is patient aware of and in agreement with discharge plan? yes    Arrangements for medication:  AMA discharge no medications or prescriptions provided    Copy of discharge instructions to  provider?:  Patient will provide    Arrangements for transportation home:  Patient will arrange a ride.    Mental health crisis number:  911 or your local mental health crisis line number at [facility]  [phone]    Patient refused follow up, states he will attend AA meetings and obtain a sponsor on discharge.

## 2010-09-01 NOTE — Discharge Summary (Signed)
Pt was assessed by [facility] and decided to leave AMA  Most likely his presentation is related to substance use and pesonality d/o; i recommended inpt stabilization but pt wanted to leave. He had reported he was not feeling safe early today but later changed his mind and stated he would like to leave. Considering his SI and BPD; i requested [facility] for assessment    D/C dx  I: Bipolar d/o  Cocaine abuse  TCH abuse  II: BPD  III: Seizures; see records (TBI)  IV: limited social support  V: GAFF=[id number]  Pt will be d/c AMA

## 2010-09-01 NOTE — Behavioral Health Treatment Team (Addendum)
Pt was seen with staff  Pt still feels depressed; initially stated wanted to stay longer and then asked to be discharged today  Feels sxs are related to substance (cocaine and THC)  "on the verge of moving to [place]"-- if "discharged today, will leave today".  All of adoptive family live in [place]  MSE:  Pt is AAOx3, calm, cooperative, contact for safety is questionable    A/P  Will ask [facility] for assessment since pt wants to leave AMA  Pt not interested in options offered by our social worker  Recommended that pt enroll in DBT  Informed him that the writer will not be able to see him as outpt and offered to refer him to some one in the community. Considering his history, I would recommend a practice more resources that can be reached when pt is in crises rather than repeated hospitalizations (DBT would be the ideal program)

## 2010-09-01 NOTE — Progress Notes (Signed)
Problem: Discharge Planning  Goal: *Discharge to safe environment  Outcome: Progressing Towards Goal  Patient will stay with family on discharge.  Goal: *Knowledge of medication management  Outcome: Progressing Towards Goal  Patient discussed medications with attending.  Goal: *Knowledge of discharge instructions  Outcome: Progressing Towards Goal  He is able to discuss discharge.    Problem: Patient Education: Go to Patient Education Activity  Goal: Patient/Family Education  Outcome: Progressing Towards Goal  SW INTERDISCIPLINARY ROUNDS    NAME:[name]  DOB:   1986/07/26  MRN:  [id number]      Patient presentation including presence/absence SI/HI, psychosis, ability to perform ADLs: He is reality based, expressing no SI or HI.  He is able to perform his own ADLS    Concerns expressed by patient: Patient discussed his plans for discharge and is requesting to discharge.  He will be assessed by [facility] for safety.      Family involvement: Minimal    Level of group participation: Attending    Need for transfer: None    Resource needs: Discharge plans    Strengths: Resources    Limitations: Lack of support and coping skills    Other:
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
## 2010-09-01 NOTE — Behavioral Health Treatment Team (Signed)
Patient has slept throughout the night without complaint.

## 2010-09-02 NOTE — Behavioral Health Treatment Team (Signed)
Admission reviewed for medical necessity.

## 2010-09-24 NOTE — ED Notes (Signed)
Headache for 1 hour, says hes used his medication but it hasnt helped. Says he is feeling suicidal and probably tried to overdose.  Says he has used cocaine in the past 24 hours.  Has hx of migraine

## 2010-09-24 NOTE — Progress Notes (Signed)
Patient admitted to taking 600mg  of Lamictal today in attempt to hurt self.
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
## 2010-09-25 LAB — CBC WITH AUTOMATED DIFF
ABS. BASOPHILS: 0 10*3/uL (ref 0.0–0.1)
ABS. EOSINOPHILS: 0 10*3/uL (ref 0.0–0.4)
ABS. LYMPHOCYTES: 2.2 10*3/uL (ref 0.8–3.5)
ABS. MONOCYTES: 1 10*3/uL (ref 0.0–1.0)
ABS. NEUTROPHILS: 8.6 10*3/uL — ABNORMAL HIGH (ref 1.8–8.0)
BASOPHILS: 0 % (ref 0–1)
EOSINOPHILS: 0 % (ref 0–7)
HCT: 41.4 % (ref 36.6–50.3)
HGB: 14.5 g/dL (ref 12.1–17.0)
LYMPHOCYTES: 19 % (ref 12–49)
MCH: 31.7 PG (ref 26.0–34.0)
MCHC: 35 g/dL (ref 30.0–36.5)
MCV: 90.6 FL (ref 80.0–99.0)
MONOCYTES: 8 % (ref 5–13)
NEUTROPHILS: 73 % (ref 32–75)
PLATELET: 245 10*3/uL (ref 150–400)
RBC: 4.57 M/uL (ref 4.10–5.70)
RDW: 14.4 % (ref 11.5–14.5)
WBC: 11.8 10*3/uL — ABNORMAL HIGH (ref 4.1–11.1)

## 2010-09-25 LAB — METABOLIC PANEL, COMPREHENSIVE
A-G Ratio: 1.4 (ref 1.1–2.2)
ALT (SGPT): 45 U/L (ref 12–78)
AST (SGOT): 27 U/L (ref 15–37)
Albumin: 4.5 g/dL (ref 3.5–5.0)
Alk. phosphatase: 101 U/L (ref 50–136)
Anion gap: 9 mmol/L (ref 5–15)
BUN/Creatinine ratio: 19 (ref 12–20)
BUN: 17 MG/DL (ref 6–20)
Bilirubin, total: 0.5 MG/DL (ref 0.2–1.0)
CO2: 28 MMOL/L (ref 21–32)
Calcium: 9.1 MG/DL (ref 8.5–10.1)
Chloride: 104 MMOL/L (ref 97–108)
Creatinine: 0.9 MG/DL (ref 0.6–1.3)
GFR est AA: >60 mL/min/{1.73_m2} (ref >60)
GFR est non-AA: >60 mL/min/{1.73_m2} (ref >60)
Globulin: 3.3 g/dL (ref 2.0–4.0)
Glucose: 111 MG/DL — ABNORMAL HIGH (ref 65–100)
Potassium: 3.7 MMOL/L (ref 3.5–5.1)
Protein, total: 7.8 g/dL (ref 6.4–8.2)
Sodium: 141 MMOL/L (ref 136–145)

## 2010-09-25 LAB — PHENYTOIN: Phenytoin: 9.4 ug/mL — ABNORMAL LOW (ref 10.0–20.0)

## 2010-09-25 LAB — SALICYLATE: Salicylate level: 2.1 MG/DL — ABNORMAL LOW (ref 2.8–20.0)

## 2010-09-25 LAB — URINALYSIS W/ RFLX MICROSCOPIC
Bacteria: NEGATIVE /HPF
Bilirubin: NEGATIVE
Blood: NEGATIVE
Glucose: NEGATIVE MG/DL
Leukocyte Esterase: NEGATIVE
Nitrites: NEGATIVE
Specific gravity: >1.03 — ABNORMAL HIGH (ref 1.003–1.030)
Urobilinogen: 1 EU/DL (ref 0.2–1.0)
pH (UA): 5.5 (ref 5.0–8.0)

## 2010-09-25 LAB — DRUG SCREEN, URINE
AMPHETAMINES: NEGATIVE
BARBITURATES: NEGATIVE
BENZODIAZEPINES: NEGATIVE
COCAINE: POSITIVE — AB
METHADONE: NEGATIVE
OPIATES: NEGATIVE
PCP(PHENCYCLIDINE): NEGATIVE
THC (TH-CANNABINOL): POSITIVE — AB

## 2010-09-25 LAB — ETHYL ALCOHOL: ALCOHOL(ETHYL),SERUM: <10 MG/DL (ref <10)

## 2010-09-25 LAB — ACETAMINOPHEN: Acetaminophen level: <2 ug/mL — ABNORMAL LOW (ref 10–30)

## 2010-09-25 MED ORDER — TRAZODONE 100 MG TAB
100 mg | ORAL_TABLET | Freq: Every evening | ORAL | Status: AC
Start: 2010-09-25 — End: 2010-09-28

## 2010-09-25 MED ORDER — HALOPERIDOL 10 MG TAB
10 mg | ORAL_TABLET | Freq: Every evening | ORAL | Status: AC
Start: 2010-09-25 — End: 2010-09-28

## 2010-09-25 MED ORDER — LEVETIRACETAM 250 MG TAB
250 mg | ORAL_TABLET | Freq: Two times a day (BID) | ORAL | Status: AC
Start: 2010-09-25 — End: 2010-09-28

## 2010-09-25 MED ORDER — HYDROCHLOROTHIAZIDE 25 MG TAB
25 mg | ORAL_TABLET | Freq: Every day | ORAL | Status: AC
Start: 2010-09-25 — End: 2010-09-28

## 2010-09-25 MED ORDER — LAMOTRIGINE 100 MG TAB
100 mg | ORAL_TABLET | Freq: Two times a day (BID) | ORAL | Status: AC
Start: 2010-09-25 — End: 2010-09-28

## 2010-09-25 MED ORDER — KETOROLAC TROMETHAMINE 30 MG/ML INJECTION
30 mg/mL (1 mL) | INTRAMUSCULAR | Status: AC
Start: 2010-09-25 — End: 2010-09-24
  Administered 2010-09-25: 05:00:00 via INTRAVENOUS

## 2010-09-25 MED ORDER — FLUOXETINE 40 MG CAP
40 mg | ORAL_CAPSULE | Freq: Every day | ORAL | Status: AC
Start: 2010-09-25 — End: 2010-09-28

## 2010-09-25 MED ORDER — ONDANSETRON 4 MG TAB, RAPID DISSOLVE
4 mg | ORAL | Status: AC
Start: 2010-09-25 — End: 2010-09-24
  Administered 2010-09-25: 05:00:00 via ORAL

## 2010-09-25 MED ORDER — PHENYTOIN SODIUM EXTENDED 100 MG CAP
100 mg | ORAL_CAPSULE | Freq: Three times a day (TID) | ORAL | Status: AC
Start: 2010-09-25 — End: 2010-09-28

## 2010-09-25 MED ADMIN — sodium chloride 0.9 % bolus infusion 1,000 mL: INTRAVENOUS | @ 05:00:00 | NDC 00409798309

## 2010-09-25 MED ADMIN — fosphenytoin (CEREBYX) 1,000 mg PE in 0.9% sodium chloride 100 mL IVPB: INTRAVENOUS | @ 07:00:00 | NDC 63323040310

## 2010-09-25 NOTE — ED Notes (Signed)
[facility] to [facility] stay there tonight.

## 2010-09-25 NOTE — ED Notes (Signed)
The [facility] is expecting the pt in the next hour or so. There is staff there to accept the pt upon arrival, [facility] and the ER MD as well as myself Editor, commissioning ) are all comfortable with the pt traveling to the detox center by cab. Nursing supervisor will bring cab voucher to the ED shortly.

## 2010-09-25 NOTE — ED Notes (Signed)
The pt states he is going to go to the [facility] located on [address]. i have the phone number and left a voice mail for overnight staff to make sure he can get in at this hour. Currently his IV cerebyx s is infusing without difficulty. He is a & o x 4, no acute distress noted, he walks with a steady gait to the bathroom, respirations are even and unlabored.
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
## 2010-09-25 NOTE — Other (Incomplete)
Behavioral Health

## 2010-09-26 LAB — EKG, 12 LEAD, INITIAL
Atrial Rate: 82 {beats}/min
Calculated P Axis: 42 degrees
Calculated R Axis: -2 degrees
Calculated T Axis: 16 degrees
Diagnosis: NORMAL
P-R Interval: 168 ms
Q-T Interval: 394 ms
QRS Duration: 100 ms
QTC Calculation (Bezet): 460 ms
Ventricular Rate: 82 {beats}/min

## 2010-09-30 ENCOUNTER — Inpatient Hospital Stay
Admit: 2010-09-30 | Discharge: 2010-10-06 | Disposition: A | Discharge status: Home / Self Care | Payer: MEDICARE | Attending: Psychiatry | Admitting: Psychiatry

## 2010-09-30 DIAGNOSIS — F4321 Adjustment disorder with depressed mood: Secondary | ICD-10-CM

## 2010-09-30 LAB — POC CHEM8
Anion gap (POC): 16 mmol/L — ABNORMAL HIGH (ref 5–15)
BUN (POC): 14 MG/DL (ref 9–20)
CO2 (POC): 25 MMOL/L (ref 21–32)
Calcium, ionized (POC): 1.15 MMOL/L (ref 1.12–1.32)
Chloride (POC): 103 MMOL/L (ref 98–107)
Creatinine (POC): 1 MG/DL (ref 0.6–1.3)
GFRAA, POC: >60 mL/min/{1.73_m2} (ref >60)
GFRNA, POC: >60 mL/min/{1.73_m2} (ref >60)
Glucose (POC): 86 MG/DL (ref 75–110)
Hematocrit (POC): 46 % (ref 36.6–50.3)
Hemoglobin (POC): 15.6 GM/DL (ref 12.1–17.0)
Potassium (POC): 3.9 MMOL/L (ref 3.5–5.1)
Sodium (POC): 138 MMOL/L (ref 137–145)

## 2010-09-30 LAB — DRUG SCREEN, URINE
AMPHETAMINES: NEGATIVE
BARBITURATES: NEGATIVE
BENZODIAZEPINES: NEGATIVE
COCAINE: NEGATIVE
METHADONE: NEGATIVE
OPIATES: NEGATIVE
PCP(PHENCYCLIDINE): NEGATIVE
THC (TH-CANNABINOL): NEGATIVE

## 2010-09-30 LAB — ETHYL ALCOHOL: ALCOHOL(ETHYL),SERUM: 10 MG/DL — ABNORMAL HIGH (ref <10)

## 2010-09-30 MED ADMIN — levETIRAcetam (KEPPRA) tablet 500 mg: ORAL | @ 23:00:00 | NDC 68084033711

## 2010-09-30 MED ADMIN — phenytoin ER (DILANTIN ER) ER capsule 100 mg: ORAL | @ 23:00:00 | NDC 51079090501

## 2010-09-30 NOTE — H&P (Signed)
Patient seen, chart reviewed, staffing held and dictated report done. Please see dictated report for complete details.

## 2010-09-30 NOTE — Behavioral Health Treatment Team (Cosign Needed)
Pt did not attend coping skills group.

## 2010-09-30 NOTE — ED Notes (Signed)
The patient was ordered a meal tray.

## 2010-09-30 NOTE — ED Notes (Signed)
Pt reports being out of sz medications, was hospitalized in ED yesterday for multiple seizures, stated he has gotten prescriptions to be filled.

## 2010-09-30 NOTE — Behavioral Health Treatment Team (Cosign Needed)
GROUP THERAPY PROGRESS NOTE    The patient [name] a 25 y.o. male is participating in Reflections Group    Group time: 30 minutes    Personal goal for participation: To discuss the daily goals    Goal orientation: personal    Group therapy participation: passive    Therapeutic interventions reviewed and discussed:  Yes    Impression of participation: fair  Darrel Hoover  09/30/2010  9:32 PM

## 2010-09-30 NOTE — Progress Notes (Signed)
Admitted a 25 yr old male from [place], h/o bipolar, anxiety, depression. H/o suicide attempts in the past., allergic to seafood, shellfish and iodine. UDS  is -ve and BAL- Less than 10. Vitals stable, h/o seizures when stressed and hypercholesterol.. Off medications and as per the patient he has taken his seizure medications yesterday. On admission he is cooperative  And transferred to acute side.

## 2010-09-30 NOTE — Progress Notes (Signed)
TRANSFER - OUT REPORT:    Verbal report given to Benita Chalmers(name) on [name]  being transferred to Behavioral Health(unit) for routine progression of care       Report consisted of patient???s Situation, Background, Assessment and   Recommendations(SBAR).     Information from the following report(s) SBAR, ED Summary, Procedure Summary, Intake/Output and Recent Results was reviewed with the receiving nurse.    Opportunity for questions and clarification was provided.

## 2010-09-30 NOTE — Behavioral Health Treatment Team (Signed)
Pt isolative this shift preferring to stay in his room.He admits to feeling depressed but currently does not feel like hurting himself. Denies hearing voices. Does not attend groups. Sarcastic and entitled. No behavior issues presented.

## 2010-09-30 NOTE — ED Notes (Signed)
The patient reports that he has attempted suicide in the past, the pt reports that he usually overdoses on prescription medication. The pt denies any hallucinations, he also denies any suicidal or homicidal ideations at this time.
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
## 2010-09-30 NOTE — Behavioral Health Treatment Team (Cosign Needed)
Pt did not attend creative expression group.

## 2010-10-01 MED ADMIN — phenytoin ER (DILANTIN ER) ER capsule 100 mg: ORAL | @ 23:00:00 | NDC 51079090501

## 2010-10-01 MED ADMIN — hydrochlorothiazide (HYDRODIURIL) tablet 25 mg: ORAL | @ 13:00:00 | NDC 00172208300

## 2010-10-01 MED ADMIN — LORazepam (ATIVAN) tablet 2 mg: ORAL | @ 13:00:00 | NDC 68084008911

## 2010-10-01 MED ADMIN — FLUoxetine (PROZAC) capsule 40 mg: ORAL | @ 18:00:00 | NDC 00904578561

## 2010-10-01 MED ADMIN — levETIRAcetam (KEPPRA) tablet 500 mg: ORAL | @ 23:00:00 | NDC 68084033711

## 2010-10-01 MED ADMIN — phenytoin ER (DILANTIN ER) ER capsule 100 mg: ORAL | @ 18:00:00 | NDC 51079090501

## 2010-10-01 MED ADMIN — lamoTRIgine (LAMICTAL) tablet 100 mg: ORAL | @ 18:00:00 | NDC 68382000801

## 2010-10-01 MED ADMIN — phenytoin ER (DILANTIN ER) ER capsule 100 mg: ORAL | @ 13:00:00 | NDC 51079090501

## 2010-10-01 MED ADMIN — LORazepam (ATIVAN) tablet 2 mg: ORAL | @ 23:00:00 | NDC 68084008911

## 2010-10-01 MED ADMIN — LORazepam (ATIVAN) tablet 2 mg: ORAL | @ 18:00:00 | NDC 68084008911

## 2010-10-01 MED ADMIN — levETIRAcetam (KEPPRA) tablet 500 mg: ORAL | @ 13:00:00 | NDC 68084033711

## 2010-10-01 MED ADMIN — lamoTRIgine (LAMICTAL) tablet 100 mg: ORAL | @ 23:00:00 | NDC 68382000801

## 2010-10-01 NOTE — Behavioral Health Treatment Team (Signed)
Pt did not attend social work process group.

## 2010-10-01 NOTE — Behavioral Health Treatment Team (Signed)
Pt rested during the night with eyes closed. Will continue to monitor.

## 2010-10-01 NOTE — Behavioral Health Treatment Team (Signed)
nsg note  Have seen and talked with the client on four different occations this pm.  No seizure activity noted or reported during q 15 min checks.

## 2010-10-01 NOTE — Behavioral Health Treatment Team (Cosign Needed)
GROUP THERAPY PROGRESS NOTE    The patient [name] a 25 y.o. male is participating in Reflections Group    Group time: 30 minutes    Personal goal for participation: To discuss the daily goals    Goal orientation: personal    Group therapy participation: active    Therapeutic interventions reviewed and discussed:  Yes    Impression of participation: good    Darrel Hoover  10/01/2010  9:31 PM

## 2010-10-01 NOTE — Behavioral Health Treatment Team (Cosign Needed)
GROUP THERAPY PROGRESS NOTE    The patient [name] a 25 y.o. male is participating in Creative Expression Group.     Group time: 1 hour    Personal goal for participation: To concentrate on selected task    Goal orientation: social    Group therapy participation: active    Therapeutic interventions reviewed and discussed: Crafts, games, music    Impression of participation: The patient was attentive.    BEVERLY S BAKER  10/02/2010  10:18 AM

## 2010-10-01 NOTE — Behavioral Health Treatment Team (Cosign Needed)
GROUP THERAPY PROGRESS NOTE    The patient [name] a 25 y.o. male is participating in Coping Skills Group.     Group time: 45 minutes    Personal goal for participation: To identify positive coping strategies    Goal orientation:  personal    Group therapy participation: active    Therapeutic interventions reviewed and discussed: handout and worksheet    Impression of participation:  The patient was attentive.    BEVERLY S BAKER  10/02/2010  1:09 PM
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
## 2010-10-01 NOTE — Behavioral Health Treatment Team (Signed)
Patient remains isolative this shift has entitled behavior isolating to his room to stay in his room. Patient has been medication and meal compliant,He admits to feeling depressed but currently does not feel like hurting himself. Patient denies SI/HI and denies hearing voices. Does not attend groups. Patient has been Sarcastic and entitled.  Patient has No behavior issues presented. We will continue to monitor for safety per hospital protocol.

## 2010-10-01 NOTE — Behavioral Health Treatment Team (Cosign Needed)
GROUP THERAPY PROGRESS NOTE    The patient [name] is participating in Community Group.    Group time: 30 minutes    Personal goal for participation: to orient the patient to the unit.    Goal orientation: successful adoption of unit rules    Group therapy participation: minimal    Therapeutic interventions reviewed and discussed: Yes    Impression of participation:minimal  JAMES E CROSS  10/01/2010 3:12 PM

## 2010-10-01 NOTE — Behavioral Health Treatment Team (Deleted)
GROUP THERAPY PROGRESS NOTE    The patient [name] a 25 y.o. male is participating in Coping Skills Group.     Group time: 45 minutes    Personal goal for participation: To identify positive coping strategies    Goal orientation:  personal    Group therapy participation: active    Therapeutic interventions reviewed and discussed: handout and worksheet    Impression of participation:  The patient was attentive.    BEVERLY S BAKER  10/02/2010  10:13 AM

## 2010-10-01 NOTE — Behavioral Health Treatment Team (Signed)
Pt alert and oriented x 3, pleasant, calm and cooperative.  Mood and affect congruent.  Pt visible in the dayroom, appropriately interacting with staff/peers.  Compliant with meals and medications.  Encouraged to participate in group activities.  Visual checks continue q 15 minutes for safety.

## 2010-10-01 NOTE — Behavioral Health Treatment Team (Signed)
Psychiatric Progress Note    Date: 10/01/2010  Account Number:  [id number]  Name: [name]    Length of psychotherapy session (Supportive/reality-oriented) : 20 minutes. Psychotherapy provided in regards to pre-admission and current problems.  Psychosocial issues discussed in full.  Psychoeducation provided. Treatment plan reviewed with patient, including diagnosis and medications.    Total Time Spent with Patient: 25 minutes :    Total Patient Care Time Spent: 35 minutes : (Coordinated treatment team rounds conducted with patient present; discussions with nurses, social worker, pharmacist, recreation therapist, case manager held, counseling time with patient, individual psychotherapy with patient, and discussions with family members). Chart reviewed in full.    SUBJECTIVE:   Patient reports no new problems or issues since last psychiatric contact.  Patient denies del/hal/SI/HI.  Patient with insight. Patient a poor/untruthful historian.      Appetite:improved   Sleep: improved     Side Effects:  None reported or admitted to.    OBJECTIVE:                 Mental Status exam: WNL except for    Sensorium  oriented to time, place and person   Relations unreliable   Appearance:  overweight   Motor Behavior:  within normal limits   Speech:  normal pitch and normal volume   Thought Process: goal directed and logical   Thought Content free of delusions and free of hallucinations   Suicidal ideations none and contracts for safety   Homicidal ideations none and contracts for safety   Mood:  anxious and sad   Affect:  euthymic   Memory recent  adequate   Memory remote:  adequate   Concentration:  adequate   Abstraction:  abstract   Insight:  limited   Reliability fair   Judgment:  poor     Pertinent data:  No data found.    No results found for this or any previous visit (from the past 24 hour(s)).    Medications:  Current Facility-Administered Medications   Medication Dose Route Frequency    ??? lamoTRIgine (LAMICTAL) tablet 100 mg  100 mg Oral BID   ??? FLUoxetine (PROZAC) capsule 40 mg  40 mg Oral DAILY   ??? hydrOXYzine (ATARAX) tablet 25 mg  25 mg Oral TID PRN   ??? zolpidem (AMBIEN) tablet 10 mg  10 mg Oral QHS   ??? zolpidem (AMBIEN) tablet 10 mg  10 mg Oral QHS PRN   ??? OLANZapine (ZYPREXA) tablet 10 mg  10 mg Oral Q6H PRN   ??? ziprasidone (GEODON) 20 mg in sterile water (preservative free) injection  20 mg IntraMUSCular Q6H PRN   ??? benztropine (COGENTIN) tablet 2 mg  2 mg Oral BID PRN   ??? benztropine (COGENTIN) injection 2 mg  2 mg IntraMUSCular Q6H PRN   ??? LORazepam (ATIVAN) injection 2 mg  2 mg IntraMUSCular Q4H PRN   ??? LORazepam (ATIVAN) tablet 2 mg  2 mg Oral Q4H PRN   ??? acetaminophen (TYLENOL) tablet 650 mg  650 mg Oral Q4H PRN   ??? ibuprofen (MOTRIN) tablet 800 mg  800 mg Oral Q6H PRN   ??? magnesium hydroxide (MILK OF MAGNESIA) oral suspension 30 mL  30 mL Oral DAILY PRN   ??? phenytoin ER (DILANTIN ER) ER capsule 100 mg  100 mg Oral TID   ??? levETIRAcetam (KEPPRA) tablet 500 mg  500 mg Oral BID   ??? hydrochlorothiazide (HYDRODIURIL) tablet 25 mg  25 mg Oral DAILY  Scheduled Medications:  Current Facility-Administered Medications   Medication Dose Route Frequency   ??? lamoTRIgine (LAMICTAL) tablet 100 mg  100 mg Oral BID   ??? FLUoxetine (PROZAC) capsule 40 mg  40 mg Oral DAILY   ??? zolpidem (AMBIEN) tablet 10 mg  10 mg Oral QHS   ??? phenytoin ER (DILANTIN ER) ER capsule 100 mg  100 mg Oral TID   ??? levETIRAcetam (KEPPRA) tablet 500 mg  500 mg Oral BID   ??? hydrochlorothiazide (HYDRODIURIL) tablet 25 mg  25 mg Oral DAILY       Medication Changes: please see above for details  Will continue to adjust medications as deemed appropriate and to response for symptom management.    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     Polysubstance dependence (10/01/2010)         Target Symptoms Affecting Safety/ADL's/Roles/Relationships:    Improving Resolved Same Worse N/A   Depression [x]                               []                               []                               []                               []                                 Mania/Hypomania []                               []                               []                               []                               [x]                                 Anxiety [x]                               []                               []                               []                               []   Psychosis []                               []                               []                               []                               [x]                                 Agitation/Aggression []                               [x]                               []                               []                               []                                 Insomnia []                               []                               []                               []                               [x]                                 Confusion/Disorganization []          []          []          []          [x]                      Patient's overall response to treatment:  [x]                               Positive: mild    []                               Negative   []   No Change   []                                Close to Baseline         Reason for continued hospitalization:  [x]                                Further Stabilization Needed   [x]                                Unsafe to Self/Others   [x]                                Not at Baseline    [x]                                Needs Intensive Inpatient Treatment Still   [x]                                At high risk of relapse if discharged at this time       Expected Discharge Date (Day)/Estimated Length of Stay: mond/friday    Status/Treatment Plan:     Continues to show gradual improvement in overall condition. Wants to be on benzos-willing to try atarax.restart prozac. Needs aoda rehab upon dc.    Will continue with the above changes in medication management (see medication orders made in EMR). Will continue to provide individual psychotherapy and milieu, recreational, occupational, group, substance abuse therapies to address target symptoms and as deemed appropriate for the individual patient.      Signed By: Mackie Pai, MD

## 2010-10-01 NOTE — Behavioral Health Treatment Team (Cosign Needed)
GROUP THERAPY PROGRESS NOTE    The patient [name] a 25 y.o. male is participating in Creative Expression Group.     Group time: 1 hour    Personal goal for participation: To concentrate on selected task    Goal orientation: social    Group therapy participation: active    Therapeutic interventions reviewed and discussed: Crafts, games, music    Impression of participation: The patient was attentive.    BEVERLY S BAKER  10/02/2010  1:14 PM
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
## 2010-10-01 NOTE — Behavioral Health Treatment Team (Cosign Needed)
Client low key mood pleasant no discomfort or behavioral concern voice,Clt. observed watching TV,socializing with peers and staff no seizure concern noted or voice,  Retired to room, tolerated meals and medication no problems this tour; Will continue q3min. safety checks

## 2010-10-02 MED ADMIN — LORazepam (ATIVAN) tablet 0.5 mg: ORAL | @ 18:00:00 | NDC 68084008811

## 2010-10-02 MED ADMIN — FLUoxetine (PROZAC) capsule 40 mg: ORAL | @ 13:00:00 | NDC 00904578561

## 2010-10-02 MED ADMIN — phenytoin ER (DILANTIN ER) ER capsule 100 mg: ORAL | @ 18:00:00 | NDC 51079090501

## 2010-10-02 MED ADMIN — phenytoin ER (DILANTIN ER) ER capsule 100 mg: ORAL | @ 13:00:00 | NDC 51079090501

## 2010-10-02 MED ADMIN — levETIRAcetam (KEPPRA) tablet 500 mg: ORAL | @ 23:00:00 | NDC 68084033711

## 2010-10-02 MED ADMIN — LORazepam (ATIVAN) tablet 2 mg: ORAL | @ 02:00:00 | NDC 68084008911

## 2010-10-02 MED ADMIN — LORazepam (ATIVAN) tablet 2 mg: ORAL | @ 08:00:00 | NDC 68084008911

## 2010-10-02 MED ADMIN — zolpidem (AMBIEN) tablet 10 mg: ORAL | @ 03:00:00 | NDC 68084022511

## 2010-10-02 MED ADMIN — LORazepam (ATIVAN) tablet 2 mg: ORAL | @ 13:00:00 | NDC 68084008911

## 2010-10-02 MED ADMIN — lamoTRIgine (LAMICTAL) tablet 100 mg: ORAL | @ 23:00:00 | NDC 68382000801

## 2010-10-02 MED ADMIN — OLANZapine (ZYPREXA) tablet 5 mg: ORAL | @ 15:00:00 | NDC 66993004830

## 2010-10-02 MED ADMIN — lamoTRIgine (LAMICTAL) tablet 100 mg: ORAL | @ 13:00:00 | NDC 68382000801

## 2010-10-02 MED ADMIN — levETIRAcetam (KEPPRA) tablet 500 mg: ORAL | @ 13:00:00 | NDC 68084033711

## 2010-10-02 MED ADMIN — hydrochlorothiazide (HYDRODIURIL) tablet 25 mg: ORAL | @ 13:00:00 | NDC 00172208300

## 2010-10-02 MED ADMIN — hydrOXYzine (ATARAX) tablet 25 mg: ORAL | NDC 68084025411

## 2010-10-02 MED ADMIN — phenytoin ER (DILANTIN ER) ER capsule 100 mg: ORAL | @ 23:00:00 | NDC 51079090501

## 2010-10-02 NOTE — Other (Signed)
GROUP THERAPY PROGRESS NOTE    [name] is participating in Substance abuse.     Group time: 45 minutes    Personal goal for participation: Progression of substance abuse    Goal orientation: personal    Group therapy participation: active    Therapeutic interventions reviewed and discussed: Watched and discussed the video: The [facility]    Impression of participation: pt watched video and engaged in discussion.  Pt is very well-versed in treatment lingo    Iona Coach, LCSW

## 2010-10-02 NOTE — Behavioral Health Treatment Team (Signed)
Psychiatric Progress Note    Date: 10/02/2010  Account Number:  [id number]  Name: [name]    Length of psychotherapy session (Supportive/reality-oriented) : 20 minutes. Psychotherapy provided in regards to pre-admission and current problems.  Psychosocial issues discussed in full.  Psychoeducation provided. Treatment plan reviewed with patient, including diagnosis and medications.    Total Time Spent with Patient: 25 minutes :    Total Patient Care Time Spent: 35 minutes : (Coordinated treatment team rounds conducted with patient present; discussions with nurses, social worker, pharmacist, recreation therapist, case manager held, counseling time with patient, individual psychotherapy with patient, and discussions with family members). Chart reviewed in full.    SUBJECTIVE:   Patient reports no new problems or issues since last psychiatric contact.  Patient denies del/hal/SI/HI.  Patient with insight. Patient a poor historian. Has been seeking ativan Q4 hrs.      Appetite:improved   Sleep: improved     Side Effects:  None reported or admitted to.    OBJECTIVE:                 Mental Status exam: WNL except for    Sensorium  oriented to time, place and person   Relations unreliable and vague   Appearance:  casually dressed   Motor Behavior:  within normal limits   Speech:  normal pitch and normal volume   Thought Process: goal directed and logical   Thought Content free of delusions and free of hallucinations   Suicidal ideations none   Homicidal ideations none   Mood:  euthymic   Affect:  euthymic   Memory recent  adequate   Memory remote:  adequate   Concentration:  adequate   Abstraction:  abstract   Insight:  fair   Reliability untruthful   Judgment:  fair     Pertinent data:  Patient Vitals for the past 8 hrs:   BP Pulse   10/02/10 1200 140/80 mmHg 70      No results found for this or any previous visit (from the past 24 hour(s)).    Medications:  Current Facility-Administered Medications    Medication Dose Route Frequency   ??? LORazepam (ATIVAN) tablet 0.5 mg  0.5 mg Oral Q4H PRN   ??? LORazepam (ATIVAN) injection 1 mg  1 mg IntraMUSCular Q4H PRN   ??? ziprasidone (GEODON) 10 mg in sterile water (preservative free) injection  10 mg IntraMUSCular Q6H PRN   ??? OLANZapine (ZYPREXA) tablet 5 mg  5 mg Oral Q6H PRN   ??? lamoTRIgine (LAMICTAL) tablet 100 mg  100 mg Oral BID   ??? FLUoxetine (PROZAC) capsule 40 mg  40 mg Oral DAILY   ??? hydrOXYzine (ATARAX) tablet 25 mg  25 mg Oral TID PRN   ??? zolpidem (AMBIEN) tablet 10 mg  10 mg Oral QHS   ??? zolpidem (AMBIEN) tablet 10 mg  10 mg Oral QHS PRN   ??? benztropine (COGENTIN) tablet 2 mg  2 mg Oral BID PRN   ??? benztropine (COGENTIN) injection 2 mg  2 mg IntraMUSCular Q6H PRN   ??? acetaminophen (TYLENOL) tablet 650 mg  650 mg Oral Q4H PRN   ??? ibuprofen (MOTRIN) tablet 800 mg  800 mg Oral Q6H PRN   ??? magnesium hydroxide (MILK OF MAGNESIA) oral suspension 30 mL  30 mL Oral DAILY PRN   ??? phenytoin ER (DILANTIN ER) ER capsule 100 mg  100 mg Oral TID   ??? levETIRAcetam (KEPPRA) tablet 500 mg  500 mg Oral  BID   ??? hydrochlorothiazide (HYDRODIURIL) tablet 25 mg  25 mg Oral DAILY       Scheduled Medications:  Current Facility-Administered Medications   Medication Dose Route Frequency   ??? lamoTRIgine (LAMICTAL) tablet 100 mg  100 mg Oral BID   ??? FLUoxetine (PROZAC) capsule 40 mg  40 mg Oral DAILY   ??? zolpidem (AMBIEN) tablet 10 mg  10 mg Oral QHS   ??? phenytoin ER (DILANTIN ER) ER capsule 100 mg  100 mg Oral TID   ??? levETIRAcetam (KEPPRA) tablet 500 mg  500 mg Oral BID   ??? hydrochlorothiazide (HYDRODIURIL) tablet 25 mg  25 mg Oral DAILY       Medication Changes: please see above for details  Will continue to adjust medications as deemed appropriate and to response for symptom management.    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     Polysubstance dependence (10/01/2010)         Target Symptoms Affecting Safety/ADL's/Roles/Relationships:    Improving Resolved Same Worse N/A   Depression [x]                               [x]                               []                               []                               []                                 Mania/Hypomania []                               []                               []                               []                               [x]                                 Anxiety [x]                               []                               []                               []                               []   Psychosis []                               [x]                               []                               []                               []                                 Agitation/Aggression []                               []                               []                               []                               [x]                                 Insomnia []                               []                               []                               []                               [x]                                 Confusion/Disorganization []          []          []          []          [x]                    \\  Patient's overall response to treatment:  [x]                               Positive: significant   []                               Negative   []   No Change   []                                Close to Baseline         Reason for continued hospitalization:  [x]                                Further Stabilization Needed   []                                Unsafe to Self/Others   []                                Not at Baseline    [x]                                Needs Intensive Inpatient Treatment Still   [x]                                At high risk of relapse if discharged at this time       Expected Discharge Date (Day)/Estimated Length of Stay: friday      Status/Treatment Plan:  Continues to show gradual improvement in overall condition. Med seeking.    Will continue with the above changes in medication management (see medication orders made in EMR). Will continue to provide individual psychotherapy and milieu, recreational, occupational, group, substance abuse therapies to address target symptoms and as deemed appropriate for the individual patient.      Signed By: Mackie Pai, MD

## 2010-10-02 NOTE — Behavioral Health Treatment Team (Signed)
Pt up once during the night c/o feeling nervous. Requested and received Ativan 2 mg po prn which was effective. Returned to bed and rested the remainder of the shift without difficulty. Will continue to monitor.

## 2010-10-02 NOTE — Behavioral Health Treatment Team (Signed)
Pt is alert and oriented. Denies SI/HI and contracts for safety. Pt denies psychotic symptoms. Pt is able to reality test. Pt is compliant with medication and meals.  Pt interacts appropriately with staff and peers. Pt is self care with ADLS. Pt is capable of participating in discharge planning. Continue to assess mood and behavior. Assist to reality test. Monitor on Q 15 checks.

## 2010-10-03 MED ADMIN — lamoTRIgine (LAMICTAL) tablet 100 mg: ORAL | @ 13:00:00 | NDC 68382000801

## 2010-10-03 MED ADMIN — LORazepam (ATIVAN) tablet 0.5 mg: ORAL | @ 22:00:00 | NDC 68084008811

## 2010-10-03 MED ADMIN — acetaminophen (TYLENOL) tablet 650 mg: ORAL | @ 16:00:00 | NDC 00904198280

## 2010-10-03 MED ADMIN — LORazepam (ATIVAN) tablet 0.5 mg: ORAL | @ 17:00:00 | NDC 68084008811

## 2010-10-03 MED ADMIN — lamoTRIgine (LAMICTAL) tablet 100 mg: ORAL | @ 22:00:00 | NDC 68382000801

## 2010-10-03 MED ADMIN — levETIRAcetam (KEPPRA) tablet 500 mg: ORAL | @ 22:00:00 | NDC 68084033711

## 2010-10-03 MED ADMIN — LORazepam (ATIVAN) tablet 0.5 mg: ORAL | @ 08:00:00 | NDC 68084008811

## 2010-10-03 MED ADMIN — LORazepam (ATIVAN) tablet 0.5 mg: ORAL | @ 13:00:00 | NDC 68084008811

## 2010-10-03 MED ADMIN — phenytoin ER (DILANTIN ER) ER capsule 100 mg: ORAL | @ 13:00:00 | NDC 51079090501

## 2010-10-03 MED ADMIN — hydrochlorothiazide (HYDRODIURIL) tablet 25 mg: ORAL | @ 13:00:00 | NDC 00172208380

## 2010-10-03 MED ADMIN — zolpidem (AMBIEN) tablet 10 mg: ORAL | @ 03:00:00 | NDC 68084022511

## 2010-10-03 MED ADMIN — levETIRAcetam (KEPPRA) tablet 500 mg: ORAL | @ 13:00:00 | NDC 68084033711

## 2010-10-03 MED ADMIN — phenytoin ER (DILANTIN ER) ER capsule 100 mg: ORAL | @ 22:00:00 | NDC 51079090501

## 2010-10-03 MED ADMIN — phenytoin ER (DILANTIN ER) ER capsule 100 mg: ORAL | @ 17:00:00 | NDC 51079090501

## 2010-10-03 MED ADMIN — FLUoxetine (PROZAC) capsule 40 mg: ORAL | @ 13:00:00 | NDC 00904578561

## 2010-10-03 NOTE — Progress Notes (Signed)
Problem: Falls - Risk of  Goal: *Knowledge of fall prevention  Outcome: Progressing Towards Goal  Aware of fall prevention

## 2010-10-03 NOTE — Behavioral Health Treatment Team (Signed)
Pt complained of anxiety this morning and requested a prn. He received Ativan pp prn. Pt is restless at times, but is also sitting in dayroom and interacting with peers. He continues to complain of anxiety. BP is 144/67. Pt complaining headache as well. Tylenol po prn given. Pt was instructed that he could not receive Ativan again until 1230.

## 2010-10-03 NOTE — Behavioral Health Treatment Team (Signed)
Pt visible on unit. Pt alert and oriented X 3 Pt calm and cooperative. Pt denies si/hi. Pt denies a/v hallucinations. Pt affect is labile  Pt mood is labile. Pt still requesting ativan several times a shift. Pt entitled but interacting appropriately at this time. Pt has no insight .Pt meal and med compliant. Pt attends and participates in groups... Pt follows direction. Pt no behavioral problem. Continue treatment.

## 2010-10-03 NOTE — Behavioral Health Treatment Team (Cosign Needed)
GROUP THERAPY PROGRESS NOTE    The patient [name] is participating in Goals Group     Group time: 30 minutes    Personal goal for participation: to process relationship plan and speak with doctor about medications    Goal orientation: personal    Group therapy participation: minimal    Therapeutic interventions reviewed and discussed: Yes    Impression of participation: good    VICTORIA C HERNANDEZ  10/03/2010  11:18 AM

## 2010-10-03 NOTE — Behavioral Health Treatment Team (Signed)
Psychiatric Progress Note    Date: 10/03/2010  Account Number:  [id number]  Name: [name]    Length of psychotherapy session (Supportive/reality-oriented) : 20 minutes. Psychotherapy provided in regards to pre-admission and current problems.  Psychosocial issues discussed in full.  Psychoeducation provided. Treatment plan reviewed with patient, including diagnosis and medications.    Total Time Spent with Patient: 25 minutes :    Total Patient Care Time Spent: 35 minutes : (Coordinated treatment team rounds conducted with patient present; discussions with nurses, social worker, pharmacist, recreation therapist, case manager held, counseling time with patient, individual psychotherapy with patient, and discussions with family members). Chart reviewed in full.    SUBJECTIVE:   Patient reports no new problems or issues since last psychiatric contact.  Patient denies del/hal/SI/HI.  Patient with insight. Patient an unreliable historian. Reports mood better. No psychosis.      Appetite:improved   Sleep: improved     Side Effects:  None reported or admitted to.    OBJECTIVE:                 Mental Status exam: WNL except for    Sensorium  oriented to time, place and person   Relations unreliable and vague   Appearance:  casually dressed   Motor Behavior:  within normal limits   Speech:  normal pitch and normal volume   Thought Process: goal directed and logical   Thought Content free of delusions and free of hallucinations   Suicidal ideations none   Homicidal ideations none   Mood:  Sl sad and anxious   Affect:  euthymic   Memory recent  adequate   Memory remote:  adequate   Concentration:  adequate   Abstraction:  abstract   Insight:  fair   Reliability untruthful   Judgment:  fair     Pertinent data:  Patient Vitals for the past 8 hrs:   BP Pulse   10/03/10 1641 122/66 mmHg 62    10/03/10 1206 124/84 mmHg 70      No results found for this or any previous visit (from the past 24 hour(s)).    Medications:   Current Facility-Administered Medications   Medication Dose Route Frequency   ??? LORazepam (ATIVAN) tablet 0.5 mg  0.5 mg Oral Q4H PRN   ??? LORazepam (ATIVAN) injection 1 mg  1 mg IntraMUSCular Q4H PRN   ??? ziprasidone (GEODON) 10 mg in sterile water (preservative free) injection  10 mg IntraMUSCular Q6H PRN   ??? OLANZapine (ZYPREXA) tablet 5 mg  5 mg Oral Q6H PRN   ??? lamoTRIgine (LAMICTAL) tablet 100 mg  100 mg Oral BID   ??? FLUoxetine (PROZAC) capsule 40 mg  40 mg Oral DAILY   ??? hydrOXYzine (ATARAX) tablet 25 mg  25 mg Oral TID PRN   ??? zolpidem (AMBIEN) tablet 10 mg  10 mg Oral QHS   ??? zolpidem (AMBIEN) tablet 10 mg  10 mg Oral QHS PRN   ??? benztropine (COGENTIN) tablet 2 mg  2 mg Oral BID PRN   ??? benztropine (COGENTIN) injection 2 mg  2 mg IntraMUSCular Q6H PRN   ??? acetaminophen (TYLENOL) tablet 650 mg  650 mg Oral Q4H PRN   ??? ibuprofen (MOTRIN) tablet 800 mg  800 mg Oral Q6H PRN   ??? magnesium hydroxide (MILK OF MAGNESIA) oral suspension 30 mL  30 mL Oral DAILY PRN   ??? phenytoin ER (DILANTIN ER) ER capsule 100 mg  100 mg Oral TID   ???  levETIRAcetam (KEPPRA) tablet 500 mg  500 mg Oral BID   ??? hydrochlorothiazide (HYDRODIURIL) tablet 25 mg  25 mg Oral DAILY       Scheduled Medications:  Current Facility-Administered Medications   Medication Dose Route Frequency   ??? lamoTRIgine (LAMICTAL) tablet 100 mg  100 mg Oral BID   ??? FLUoxetine (PROZAC) capsule 40 mg  40 mg Oral DAILY   ??? zolpidem (AMBIEN) tablet 10 mg  10 mg Oral QHS   ??? phenytoin ER (DILANTIN ER) ER capsule 100 mg  100 mg Oral TID   ??? levETIRAcetam (KEPPRA) tablet 500 mg  500 mg Oral BID   ??? hydrochlorothiazide (HYDRODIURIL) tablet 25 mg  25 mg Oral DAILY       Medication Changes: please see above for details  Will continue to adjust medications as deemed appropriate and to response for symptom management.    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     Polysubstance dependence (10/01/2010)         Target Symptoms Affecting Safety/ADL's/Roles/Relationships:    Improving Resolved Same Worse N/A   Depression [x]                               [x]                               []                               []                               []                                 Mania/Hypomania []                               []                               []                               []                               [x]                                 Anxiety [x]                               [x]                               []                               []                               []   Psychosis []                               [x]                               []                               []                               []                                 Agitation/Aggression []                               []                               []                               []                               [x]                                 Insomnia []                               []                               []                               []                               [x]                                 Confusion/Disorganization []          []          []          []          [x]                    \\  Patient's overall response to treatment:  [x]                               Positive: significant   []                               Negative   []   No Change   []                                Close to Baseline         Reason for continued hospitalization:  [x]                                Further Stabilization Needed   []                                Unsafe to Self/Others    []                                Not at Baseline   [x]                                Needs Intensive Inpatient Treatment Still   [x]                                At high risk of relapse if discharged at this time       Expected Discharge Date (Day)/Estimated Length of Stay: monday      Status/Treatment Plan:  Continues to show gradual improvement in overall condition. Abiding by limit setting. Will arrange for DBT upon dc.    Will continue with the above changes in medication management (see medication orders made in EMR). Will continue to provide individual psychotherapy and milieu, recreational, occupational, group, substance abuse therapies to address target symptoms and as deemed appropriate for the individual patient.      Signed By: Mackie Pai, MD

## 2010-10-03 NOTE — Progress Notes (Signed)
Behavioral Health Progress Note    The patient [name] is cooperative,alert and oriented.mood and affect is reactive.  He was seen by treatment team and [name] verbalized he was not able to sleep. Was encouraged to stay off street drugs. Verbalized, not feeling suicidal/homicidal. The patient has been compliant to their scheduled meals and medications.  [name] attends group meetings. Good interaction with copatient.      [name], RN  10/03/2010

## 2010-10-03 NOTE — Behavioral Health Treatment Team (Cosign Needed)
GROUP THERAPY PROGRESS NOTE    The patient [name] is participating in Community Group.     Group time: 30 minutes    Personal goal for participation: daily orientation    Goal orientation: personal    Group therapy participation: minimal    Therapeutic interventions reviewed and discussed: Yes    Impression of participation: good    VICTORIA C HERNANDEZ  10/03/2010  11:17 AM

## 2010-10-03 NOTE — Behavioral Health Treatment Team (Signed)
GROUP THERAPY PROGRESS NOTE    [name] is participating in Coping Skills Group.     Group time: 30 minutes    Personal goal for participation: discharge    Goal orientation: relaxation    Group therapy participation: active    Therapeutic interventions reviewed and discussed:     Impression of participation:

## 2010-10-03 NOTE — Behavioral Health Treatment Team (Signed)
Pt c/o of +2 anxiety, received Ativan 0.5 mg po per his request.  Pt returned to bed  w/out incident.  Chart check completed.

## 2010-10-04 MED ADMIN — zolpidem (AMBIEN) tablet 10 mg: ORAL | @ 04:00:00 | NDC 68084022511

## 2010-10-04 MED ADMIN — lamoTRIgine (LAMICTAL) tablet 100 mg: ORAL | @ 22:00:00 | NDC 68084031911

## 2010-10-04 MED ADMIN — LORazepam (ATIVAN) tablet 0.5 mg: ORAL | @ 02:00:00 | NDC 68084008811

## 2010-10-04 MED ADMIN — LORazepam (ATIVAN) tablet 0.5 mg: ORAL | @ 22:00:00 | NDC 68084008811

## 2010-10-04 MED ADMIN — FLUoxetine (PROZAC) capsule 40 mg: ORAL | @ 13:00:00 | NDC 00904578561

## 2010-10-04 MED ADMIN — phenytoin ER (DILANTIN ER) ER capsule 100 mg: ORAL | @ 17:00:00 | NDC 51079090501

## 2010-10-04 MED ADMIN — levETIRAcetam (KEPPRA) tablet 500 mg: ORAL | @ 22:00:00 | NDC 68084033711

## 2010-10-04 MED ADMIN — hydrochlorothiazide (HYDRODIURIL) tablet 25 mg: ORAL | @ 13:00:00 | NDC 00172208300

## 2010-10-04 MED ADMIN — phenytoin ER (DILANTIN ER) ER capsule 100 mg: ORAL | @ 13:00:00 | NDC 51079090501

## 2010-10-04 MED ADMIN — levETIRAcetam (KEPPRA) tablet 500 mg: ORAL | @ 13:00:00 | NDC 68084033711

## 2010-10-04 MED ADMIN — LORazepam (ATIVAN) tablet 0.5 mg: ORAL | @ 13:00:00 | NDC 68084008811

## 2010-10-04 MED ADMIN — zolpidem (AMBIEN) tablet 10 mg: ORAL | @ 02:00:00 | NDC 68084022511

## 2010-10-04 MED ADMIN — lamoTRIgine (LAMICTAL) tablet 100 mg: ORAL | @ 13:00:00 | NDC 68084031911

## 2010-10-04 MED ADMIN — LORazepam (ATIVAN) tablet 0.5 mg: ORAL | @ 17:00:00 | NDC 68084008811

## 2010-10-04 MED ADMIN — phenytoin ER (DILANTIN ER) ER capsule 100 mg: ORAL | @ 22:00:00 | NDC 51079090501

## 2010-10-04 NOTE — Behavioral Health Treatment Team (Cosign Needed)
GROUP THERAPY PROGRESS NOTE    [name] is participating in [place].     Group time: 30 minutes    Personal goal for participation: reality orientation    Goal orientation: social    Group therapy participation: active    Therapeutic interventions reviewed and discussed: yes    Impression of participation: good

## 2010-10-04 NOTE — Behavioral Health Treatment Team (Signed)
Resting in bed. No c/o pain/discomfort.  Chart check completed.

## 2010-10-04 NOTE — Behavioral Health Treatment Team (Signed)
Psychiatric Progress Note    Date:  10/04/2010  MRN: [id number]  Name: [name]  DOB: 29-Jan-1986    Length of psychotherapy session (Supportive/reality-oriented) : 15 minutes. Psychotherapy provided in regards to pre-admission and current problems. Psychoeducation provided. Treatment plan reviewed with patient, including diagnosis and medications.  Total Patient Care Time Spent: 20 minutes : (Coordinated treatment team rounds, discussions with nurses, social worker, pharmacist, recreation therapist, case manager, counseling time with patient, and discussion with family members). Chart reviewed in full.    SUBJECTIVE:   Patient reports no new problems or issues since last psychiatric contact.He c/o anxiety and wants to go back on his clonazepam.   Appetite:improved   Sleep improved      Side Effects:  none    OBJECTIVE:       Patient reports no new problems or issues since initial psychiatric interview. He cooperative, Information systems manager. He does not appear very anxious.  Psychotherapy provided in regards to pre-admission and current problems.  Psychoeducation provided. Treatment plan reviewed with patient, including diagnoses and medications.  Case discussed in full with treatment team.  Chart reviewed in full.      Mental Status exam: WNL except for    Sensorium  oriented to time, place and person   Relations cooperative   Appearance:  age appropriate and overweight   Motor Behavior:  within normal limits   Speech:  normal pitch and normal volume   Thought Process: logical   Thought Content free of delusions and free of hallucinations   Suicidal ideations none   Homicidal ideations none   Mood:  depressed   Affect:  anxious   Memory recent  adequate   Memory remote:  adequate   Concentration:  adequate   Abstraction:  concrete   Insight:  limited   Reliability fair   Judgment:  limited     Pertinent data:  Patient Vitals for the past 8 hrs:   BP Pulse Resp   10/04/10 0555 145/73 mmHg 62  16       No results found for this or any previous visit (from the past 24 hour(s)).    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     Polysubstance dependence (10/01/2010)       Target Symptoms Affecting Safety/ADL's/Roles/Relationships:    Improving Resolved Same Worse N/A   Depression [x]                 []                 []                 []                 []                   Mania/Hypomania []                 []                 []                 []                 []   Anxiety [x]                 []                 []                 []                 []                   Psychosis []                 []                 []                 []                 []                   Agitation/Agression []                 []                 []                 []                 []                   Insomnia []                 []                 []                 []                 []                           Medications:  Current Facility-Administered Medications   Medication Dose Route Frequency   ??? LORazepam (ATIVAN) tablet 0.5 mg  0.5 mg Oral Q4H PRN   ??? LORazepam (ATIVAN) injection 1 mg  1 mg IntraMUSCular Q4H PRN   ??? ziprasidone (GEODON) 10 mg in sterile water (preservative free) injection  10 mg IntraMUSCular Q6H PRN   ??? OLANZapine (ZYPREXA) tablet 5 mg  5 mg Oral Q6H PRN   ??? lamoTRIgine (LAMICTAL) tablet 100 mg  100 mg Oral BID   ??? FLUoxetine (PROZAC) capsule 40 mg  40 mg Oral DAILY   ??? hydrOXYzine (ATARAX) tablet 25 mg  25 mg Oral TID PRN   ??? zolpidem (AMBIEN) tablet 10 mg  10 mg Oral QHS   ??? zolpidem (AMBIEN) tablet 10 mg  10 mg Oral QHS PRN   ??? benztropine (COGENTIN) tablet 2 mg  2 mg Oral BID PRN   ??? benztropine (COGENTIN) injection 2 mg  2 mg IntraMUSCular Q6H PRN   ??? acetaminophen (TYLENOL) tablet 650 mg  650 mg Oral Q4H PRN    ??? ibuprofen (MOTRIN) tablet 800 mg  800 mg Oral Q6H PRN   ??? magnesium hydroxide (MILK OF MAGNESIA) oral suspension 30 mL  30 mL Oral DAILY PRN   ??? phenytoin ER (DILANTIN ER) ER capsule 100 mg  100 mg Oral TID   ??? levETIRAcetam (KEPPRA) tablet 500 mg  500 mg Oral BID   ??? hydrochlorothiazide (HYDRODIURIL) tablet 25 mg  25 mg Oral DAILY  Scheduled Medications:  Current Facility-Administered Medications   Medication Dose Route Frequency   ??? lamoTRIgine (LAMICTAL) tablet 100 mg  100 mg Oral BID   ??? FLUoxetine (PROZAC) capsule 40 mg  40 mg Oral DAILY   ??? zolpidem (AMBIEN) tablet 10 mg  10 mg Oral QHS   ??? phenytoin ER (DILANTIN ER) ER capsule 100 mg  100 mg Oral TID   ??? levETIRAcetam (KEPPRA) tablet 500 mg  500 mg Oral BID   ??? hydrochlorothiazide (HYDRODIURIL) tablet 25 mg  25 mg Oral DAILY       Medication Changes: none  Will continue to titrate/adjust medications as tolerated and to response for symptom management.    The following was addressed during rounds:  patient given opportunity to ask questions      Patient's overall response to treatment:  [x]                 Positive: mild / moderate / significant   []                 Negative   []                 No Change   []                  Close to Baseline         Reason for continued hospitalization:  [x]                  Further Stabilization Needed   []                  Unsafe to Self/Others   []                  Not at Baseline   []                  Needs Intensive Inpatient Treatment Still   []                  At high risk of relapse if discharged at this time       Expected Discharge Date: TBD    Status/Treatment Plan: .  Continues to show gradual improvement in overall condition.  Will continue with the above medication management, individual psychotherapy and  milieu, recreational, group and substance abuse (if appropriate) therapies to address target symptoms.    Signed By: Adela Lank, MD

## 2010-10-04 NOTE — Behavioral Health Treatment Team (Cosign Needed)
GROUP THERAPY PROGRESS NOTE    [name] is participating in Goals group.     Group time: 30 minutes    Personal goal for participation: create a daily goal    Goal orientation: personal    Group therapy participation: active    Therapeutic interventions reviewed and discussed: yes    Impression of participation: good

## 2010-10-04 NOTE — Behavioral Health Treatment Team (Signed)
Social Work Process Group.      Did not attend group.      Susan Rysinski, LCSW

## 2010-10-04 NOTE — Behavioral Health Treatment Team (Incomplete)
Behavioral Health    D/ [name] is alert and oriented times three with vital signs stable. [name] is attention and medication seeking, and child like. Pt states anxious and requesting Klonipin, but very social and interacting well with staff and peers. Pt has to be redirected at times.  [name]  will comply with doctors orders and unit protocol. Pt denies suicide and homicidal ideations at this time. Pt is not on any standing medications at this time. Pt independent is ADLS and hygiene fair.     A/ Monitor and assess and assist as needed. Encourage group and activities. Monitor per unit protocol and for signs and symptoms for withdrawal.    R/  Pt attending group or activities this am, but still guarded and isolated to self.    P/ Continue to monitor and assist per MD order and Care Plan.

## 2010-10-04 NOTE — Behavioral Health Treatment Team (Signed)
Pt visible in the unit interacting with selective peers but manic and labile at times, offered reality orientation, directions, encouraged interactions, pt able to follow most directions, meds/meals compliant, will continue with current tx plan.

## 2010-10-05 MED ADMIN — phenytoin ER (DILANTIN ER) ER capsule 100 mg: ORAL | @ 17:00:00 | NDC 51079090501

## 2010-10-05 MED ADMIN — levETIRAcetam (KEPPRA) tablet 500 mg: ORAL | @ 13:00:00 | NDC 68084033711

## 2010-10-05 MED ADMIN — LORazepam (ATIVAN) tablet 0.5 mg: ORAL | @ 20:00:00 | NDC 68084008811

## 2010-10-05 MED ADMIN — LORazepam (ATIVAN) tablet 0.5 mg: ORAL | @ 12:00:00 | NDC 68084008811

## 2010-10-05 MED ADMIN — phenytoin ER (DILANTIN ER) ER capsule 100 mg: ORAL | @ 13:00:00 | NDC 51079090501

## 2010-10-05 MED ADMIN — FLUoxetine (PROZAC) capsule 40 mg: ORAL | @ 13:00:00 | NDC 00904578561

## 2010-10-05 MED ADMIN — hydrochlorothiazide (HYDRODIURIL) tablet 25 mg: ORAL | @ 13:00:00 | NDC 00172208300

## 2010-10-05 MED ADMIN — lamoTRIgine (LAMICTAL) tablet 100 mg: ORAL | @ 13:00:00 | NDC 68084031911

## 2010-10-05 MED ADMIN — phenytoin ER (DILANTIN ER) ER capsule 100 mg: ORAL | @ 20:00:00 | NDC 51079090501

## 2010-10-05 MED ADMIN — zolpidem (AMBIEN) tablet 10 mg: ORAL | @ 02:00:00 | NDC 68084022511

## 2010-10-05 MED ADMIN — lamoTRIgine (LAMICTAL) tablet 100 mg: ORAL | @ 20:00:00 | NDC 68084031911

## 2010-10-05 MED ADMIN — levETIRAcetam (KEPPRA) tablet 500 mg: ORAL | @ 20:00:00 | NDC 68084033711

## 2010-10-05 NOTE — Behavioral Health Treatment Team (Signed)
Pt continues to attempt to split staff. Pt reported that he was on the telephone talking with his mother and selected staff made some derogatory comments while he was on the telephone which caused his mother to hang up the phone. Staff met with pt to assist him with calling his mother again. Pt refused and blamed selected staff. Pt then demanded to speak with the nursing supervisor. Nursing supervisor came to the unit to speak with pt. Pt continues to focused on not feeling ready to go home. Will continue to monitor.

## 2010-10-05 NOTE — Behavioral Health Treatment Team (Cosign Needed)
GROUP THERAPY PROGRESS NOTE    [name] is participating in [place].     Group time: 30 minutes    Personal goal for participation: daily orientation    Goal orientation: personal    Group therapy participation: active    Therapeutic interventions reviewed and discussed: yes    Impression of participation:cooperative

## 2010-10-05 NOTE — Behavioral Health Treatment Team (Signed)
Pt attentive for med teaching. Meds, purpose and other teaching points reviewed.

## 2010-10-05 NOTE — Behavioral Health Treatment Team (Signed)
Pt received ativan for anxiety and asked staff to call crisis because he wants to leave AMA. Pt encouraged to talk to the physician before demanding crisis.

## 2010-10-05 NOTE — Behavioral Health Treatment Team (Signed)
Pt up in dayroom and interacting with peers.  Pt very friendly and moods remain somewhat labile.  Pt stated that he is having auditory hallucinations with a voice that is "telling me that I shouldn't be in the hospital." Pt spoke with Dr. Lelon Huh on rounds today and advised him of this complaint.  Pt denies thoughts of harm to self and others.  Denies any voice telling him to hurt self or others.  Behaviorally does not present as being anxious but complained of worsening anxiety.  Pt in dayroom talking with peers and socializing and laughing.  Pt continues to be monitored for others. Psychosocial support given.  Pt able to reality test. Pt monitored for safety.

## 2010-10-05 NOTE — Other (Signed)
GROUP THERAPY PROGRESS NOTE    [name] is participating in Process Group.     Group time: 30 minutes    Personal goal for participation: coping with anxiety    Goal orientation: relaxation    Group therapy participation: active    Therapeutic interventions reviewed and discussed: Square breathing, meridian tapping, discussion re coping with anxiety via exercise, meditation, goal directed thoughts, focused attention, structured activity, medication.    Impression of participation: interested and engaged

## 2010-10-05 NOTE — Behavioral Health Treatment Team (Signed)
Pt did not attend reflections group.

## 2010-10-05 NOTE — Behavioral Health Treatment Team (Signed)
Patient slept 7 hours. No complaints.

## 2010-10-05 NOTE — Behavioral Health Treatment Team (Signed)
Psychiatric Progress Note    Date:  10/05/2010  MRN: [id number]  Name: [name]  DOB: 05/15/1986    Length of psychotherapy session (Supportive/reality-oriented) : 10 minutes. Psychotherapy provided in regards to pre-admission and current problems. Psychoeducation provided. Treatment plan reviewed with patient, including diagnosis and medications.  Total Patient Care Time Spent: 25 minutes : (Coordinated treatment team rounds, discussions with nurses, social worker, pharmacist, recreation therapist, case manager, counseling time with patient, and discussion with family members). Chart reviewed in full.    SUBJECTIVE:   Patient reports no new problems or issues since last psychiatric contact. He wants his medications to be adjusted more before D/C despite his original demand in AM to D/C today as he was not getting enough help here ??  Appetite:improved   Sleep improved      Side Effects:  none    OBJECTIVE:       Patient was reported to be medication seeking, entitled and demanding. Reports he is not happy here but does not want to be D/C.    Psychoeducation provided. Treatment plan reviewed with patient, including diagnoses and medications.  Case discussed in full with treatment team.  Chart reviewed in full.      Mental Status exam: WNL except for    Sensorium  oriented to time, place and person   Relations evasive and uncooperative   Appearance:  age appropriate and casually dressed   Motor Behavior:  hyperactive   Speech:  hyperverbal   Thought Process: illogical   Thought Content free of delusions and free of hallucinations   Suicidal ideations none   Homicidal ideations none   Mood:  irritable   Affect:  inappropriate   Memory recent  adequate   Memory remote:  adequate   Concentration:  adequate   Abstraction:  concrete   Insight:  limited   Reliability fair   Judgment:  limited     Pertinent data:  No data found.    No results found for this or any previous visit (from the past 24 hour(s)).     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     Polysubstance dependence (10/01/2010)       Target Symptoms Affecting Safety/ADL's/Roles/Relationships:    Improving Resolved Same Worse N/A   Depression [x]                 []                 []                 []                 []                   Mania/Hypomania []                 []                 []                 []                 []                   Anxiety [x]                 []                 []                 []                 []   Psychosis []                 []                 []                 []                 []                   Agitation/Agression []                 []                 []                 []                 []                   Insomnia []                 []                 []                 []                 []                           Medications:  Current Facility-Administered Medications   Medication Dose Route Frequency   ??? LORazepam (ATIVAN) tablet 0.5 mg  0.5 mg Oral Q4H PRN   ??? LORazepam (ATIVAN) injection 1 mg  1 mg IntraMUSCular Q4H PRN   ??? ziprasidone (GEODON) 10 mg in sterile water (preservative free) injection  10 mg IntraMUSCular Q6H PRN   ??? OLANZapine (ZYPREXA) tablet 5 mg  5 mg Oral Q6H PRN   ??? lamoTRIgine (LAMICTAL) tablet 100 mg  100 mg Oral BID   ??? FLUoxetine (PROZAC) capsule 40 mg  40 mg Oral DAILY   ??? hydrOXYzine (ATARAX) tablet 25 mg  25 mg Oral TID PRN   ??? zolpidem (AMBIEN) tablet 10 mg  10 mg Oral QHS   ??? zolpidem (AMBIEN) tablet 10 mg  10 mg Oral QHS PRN   ??? benztropine (COGENTIN) tablet 2 mg  2 mg Oral BID PRN   ??? benztropine (COGENTIN) injection 2 mg  2 mg IntraMUSCular Q6H PRN   ??? acetaminophen (TYLENOL) tablet 650 mg  650 mg Oral Q4H PRN   ??? ibuprofen (MOTRIN) tablet 800 mg  800 mg Oral Q6H PRN    ??? magnesium hydroxide (MILK OF MAGNESIA) oral suspension 30 mL  30 mL Oral DAILY PRN   ??? phenytoin ER (DILANTIN ER) ER capsule 100 mg  100 mg Oral TID   ??? levETIRAcetam (KEPPRA) tablet 500 mg  500 mg Oral BID   ??? hydrochlorothiazide (HYDRODIURIL) tablet 25 mg  25 mg Oral DAILY       Scheduled Medications:  Current Facility-Administered Medications   Medication Dose Route Frequency   ??? lamoTRIgine (LAMICTAL) tablet 100 mg  100 mg Oral BID   ??? FLUoxetine (PROZAC) capsule 40 mg  40 mg Oral DAILY   ??? zolpidem (AMBIEN) tablet 10 mg  10 mg Oral QHS   ??? phenytoin ER (DILANTIN ER) ER capsule 100 mg  100 mg Oral TID   ??? levETIRAcetam (KEPPRA) tablet 500 mg  500 mg Oral BID   ???  hydrochlorothiazide (HYDRODIURIL) tablet 25 mg  25 mg Oral DAILY       Medication Changes: none  Will continue to titrate/adjust medications as tolerated and to response for symptom management.    The following was addressed during rounds:  patient given opportunity to ask questions      Patient's overall response to treatment:  [x]                 Positive: mild / moderate / significant   []                 Negative   []                 No Change   []                  Close to Baseline         Reason for continued hospitalization:  [x]                  Further Stabilization Needed   []                  Unsafe to Self/Others   []                  Not at Baseline   []                  Needs Intensive Inpatient Treatment Still   []                  At high risk of relapse if discharged at this time       Expected Discharge Date: TBD    Status/Treatment Plan:   D/C today  Continues to show gradual improvement in overall condition.  Will continue with the above medication management, individual psychotherapy and  milieu, recreational, group and substance abuse (if appropriate) therapies to address target symptoms.    Signed By: Adela Lank, MD

## 2010-10-05 NOTE — Behavioral Health Treatment Team (Cosign Needed)
GROUP THERAPY PROGRESS NOTE    [name] is participating in Goals Group.     Group time: 30 minutes    Personal goal for participation: to set a daily goal    Goal orientation: personal    Group therapy participation: active    Therapeutic interventions reviewed and discussed: yes    Impression of participation: cooperative

## 2010-10-06 MED ORDER — PHENYTOIN SODIUM EXTENDED 100 MG CAP
100 mg | ORAL_CAPSULE | Freq: Three times a day (TID) | ORAL | Status: DC
Start: 2010-10-06 — End: 2011-12-27

## 2010-10-06 MED ORDER — HYDROCHLOROTHIAZIDE 25 MG TAB
25 mg | ORAL_TABLET | Freq: Every day | ORAL | Status: DC
Start: 2010-10-06 — End: 2012-01-25

## 2010-10-06 MED ORDER — FLUOXETINE 40 MG CAP
40 mg | ORAL_CAPSULE | Freq: Every day | ORAL | Status: DC
Start: 2010-10-06 — End: 2012-01-01

## 2010-10-06 MED ORDER — LEVETIRACETAM 500 MG TAB
500 mg | ORAL_TABLET | Freq: Two times a day (BID) | ORAL | Status: DC
Start: 2010-10-06 — End: 2015-04-16

## 2010-10-06 MED ORDER — LAMOTRIGINE 100 MG TAB
100 mg | ORAL_TABLET | Freq: Two times a day (BID) | ORAL | Status: DC
Start: 2010-10-06 — End: 2012-01-25

## 2010-10-06 MED ORDER — ZOLPIDEM 10 MG TAB
10 mg | ORAL_TABLET | Freq: Every evening | ORAL | Status: DC
Start: 2010-10-06 — End: 2012-01-01

## 2010-10-06 MED ADMIN — LORazepam (ATIVAN) tablet 0.5 mg: ORAL | @ 13:00:00 | NDC 68084008811

## 2010-10-06 MED ADMIN — phenytoin ER (DILANTIN ER) ER capsule 100 mg: ORAL | @ 12:00:00 | NDC 51079090501

## 2010-10-06 MED ADMIN — zolpidem (AMBIEN) tablet 10 mg: ORAL | @ 02:00:00 | NDC 68084022511

## 2010-10-06 MED ADMIN — levETIRAcetam (KEPPRA) tablet 500 mg: ORAL | @ 12:00:00 | NDC 68084033711

## 2010-10-06 MED ADMIN — phenytoin ER (DILANTIN ER) ER capsule 100 mg: ORAL | @ 16:00:00 | NDC 51079090501

## 2010-10-06 MED ADMIN — hydrochlorothiazide (HYDRODIURIL) tablet 25 mg: ORAL | @ 12:00:00 | NDC 00172208300

## 2010-10-06 MED ADMIN — lamoTRIgine (LAMICTAL) tablet 100 mg: ORAL | @ 12:00:00 | NDC 68084031911

## 2010-10-06 MED ADMIN — FLUoxetine (PROZAC) capsule 40 mg: ORAL | @ 12:00:00 | NDC 00904578561

## 2010-10-06 NOTE — Behavioral Health Treatment Team (Cosign Needed)
GROUP THERAPY PROGRESS NOTE    The patient [name] is participating in Goals Group     Group time: 30 minutes    Personal goal for participation: discharge today    Goal orientation: personal    Group therapy participation: minimal    Therapeutic interventions reviewed and discussed: Yes    Impression of participation: good    VICTORIA C HERNANDEZ  10/06/2010  1:38 PM

## 2010-10-06 NOTE — Behavioral Health Treatment Team (Signed)
Pt is polite and cooperative. Pt reports he is having a difficult time d/t hearing that his brother had an MI this past weekend. He reports he is ready to go home today and see his brother. Pt was provided with DBT information during treatment rounds and pt was receptive to this information and advice from Dr. Andria Meuse. He is interacting with peers and staff on the unit. He has a pleasant since of entitlement and has not been rude to staff today. Meal and med compliant. Denies SI/HI and A/V hall. Attending groups. Will continue to encourage group participation. Will dc pt once his parents arrive.

## 2010-10-06 NOTE — Behavioral Health Treatment Team (Signed)
Pt signed dc paperwork and instructions reviewed with pt. Pt is aware that his parents will pick him up.

## 2010-10-06 NOTE — Progress Notes (Signed)
Recommendations: cont diet as tolerated.  Pt known to me from previous admissions.  Screens at low risk at this time.    Pt's BMI of 34.5 places pt in the obesity category.  Rd following per protocol.

## 2010-10-06 NOTE — Behavioral Health Treatment Team (Signed)
Discharge Note  The pt will be discharged today and return home. Pt will be picked up by his parents.  The is scheduled for an intake appointment at [facility] [address] [place], [place] [address] on 10/14/10 @ 3PM. Pt should be bring photo ID and insurance card.     Pt is alert and oriented. Pt denies SI/HI. PT is in agreement with the plan.

## 2010-10-06 NOTE — Behavioral Health Treatment Team (Signed)
Pt rested quietly in bed with eyes closed.  No signs/symptoms of distress, agitation, or anxiety.  Will monitor for changes.  Q 15 minute checks continue.  2 hour chart audit done. Slept  7     Hours since 2300.

## 2010-10-06 NOTE — Behavioral Health Treatment Team (Cosign Needed)
GROUP THERAPY PROGRESS NOTE    [name] is participating in [place].     Group time: 30 minutes    Personal goal for participation: daily orientation    Goal orientation: personal    Group therapy participation: active    Therapeutic interventions reviewed and discussed: yes    Impression of participation: attentive

## 2010-10-06 NOTE — Other (Signed)
GROUP THERAPY PROGRESS NOTE    [name] is participating in Substance abuse.     Group time: 1 hour    Personal goal for participation: What we control and Step 1    Goal orientation: personal    Group therapy participation: active    Therapeutic interventions reviewed and discussed: Discussed what we do and do not control.  Watched and discussed a video on the First Step of AA/NA    Impression of participation: Pt was active in group, and was actively seeking the group's attention.  Pt is very well versed in treatment lingo, seeming to seek my approval.     Iona Coach, LCSW

## 2010-10-06 NOTE — Discharge Summary (Signed)
Patient seen, staffing held and discharge summary dictated: # [id number]  Please see dictated report for full details. Patient stable for discharge and considered to be at low risk of harm to self or others.  Informed consent given to the use of discharge medications.  Spent greater than 35 minutes on discharge work.

## 2010-10-07 NOTE — Discharge Summary (Signed)
Name:       [name], [name]            Admitted:    09/30/2010                                               Discharged:  10/06/2010  Account #:  [id number]                     DOB:         1986/05/26  Consultant: Everardo Beals. Andria Meuse, M.D.           Age          25                                 DISCHARGE SUMMARY      DISCHARGE DIAGNOSES  AXIS I:  1.  Adjustment disorder, depressed mood.  2.  Dysthymia.  3.  Polysubstance dependency (opiates, cocaine, marijuana).  4.  Rule out malingering versus fictitious disorder.  AXIS II: Borderline personality disorder with antisocial traits.  AXIS III: See my past medical history for details.  AXIS IV:  1.  Lack of structure.  2.  Lack of support system.  AXIS V: 50 on admission, 70 on discharge.    HISTORY OF PRESENT ILLNESS:  Please see dictated psychiatric evaluation by  writer dated 10/01/2010 in chart for full details, as well as for a  description of mental status examination at the time of initial  presentation.    HOSPITALIZATION COURSE:  The patient admitted to the inpatient psychiatric  unit for acute stabilization and further observation in regards to  complaints of depression. The patient rather unreliable, untruthful, gamey,  and manipulative in nature throughout the beginning of the hospitalization  course. With firm limits established the patient became much more  cooperative and able to abide by the rules and limits of the milieu. Mood  and affect improved steadily throughout. The patient did engage in  medication-seeking type behaviors. Firm limit setting continuously had to  be established in regards to appropriate use of nonaddictive medications  versus addictive medications. The patient free of suicidal or homicidal  ideations and mood considered to be relatively stable at the time of  discharge. The patient contracts for safety. Of note, during  hospitalization course the patient gave informed consent to reinstitution   of Prozac. In the past which seems to have worked fairly well for control  of anxiety and depressive symptomatology associated with his core diagnosis  of borderline personality disorder, as well as possible substance abuse  mood issues. Primary psychopathology clearly Axis 2 in nature, as well as  polysubstance dependency in nature. The patient does not have a true  bipolar disorder.    DISCHARGE MEDICATIONS  1. Prozac 40 mg daily.  2. Hydrochlorothiazide 25 mg daily.  3. Lamictal 100 mg b.i.d.  4. Keppra 500 mg b.i.d.  5. Dilantin 100 mg b.i.d.  6. Ambien 10 mg at bedtime p.r.n.    DISPOSITION:  The patient will be discharged to home. The patient has plans  to relocate to [place] with family. The patient to go through dialectic  behavioral therapy program once there. He has been in DBT programming before.    PROGNOSIS:  Solely dependent on  his ability to remain abstinent and follow  through with drug and alcohol rehabilitation programming and DBT  programming. The patient not considered to be imminent danger to self or  others at this point in time.              Jayme Cham Elvera Lennox Andria Meuse, M.D.    cc:   Everardo Beals. Andria Meuse, M.D.      BRS/wmx; D: 10/06/2010 11:02 A; T: 10/07/2010  5:41 A; DOC# [id number]; Job#  [id number]

## 2011-08-05 ENCOUNTER — Emergency Department (HOSPITAL_COMMUNITY)
Admission: EM | Admit: 2011-08-05 | Discharge: 2011-08-05 | Disposition: A | Discharge status: Other Acute Inpatient Hospital | Payer: Medicare Other | Attending: Emergency Medicine | Admitting: Emergency Medicine

## 2011-08-05 ENCOUNTER — Encounter (HOSPITAL_COMMUNITY): Payer: Self-pay | Admitting: Emergency Medicine

## 2011-08-05 ENCOUNTER — Emergency Department (HOSPITAL_COMMUNITY)
Admission: EM | Admit: 2011-08-05 | Discharge: 2011-08-05 | Disposition: A | Discharge status: Home / Self Care | Payer: Medicare Other | Source: Home / Self Care | Attending: Emergency Medicine | Admitting: Emergency Medicine

## 2011-08-05 ENCOUNTER — Inpatient Hospital Stay (HOSPITAL_COMMUNITY)
Admission: AD | Admit: 2011-08-05 | Discharge: 2011-08-13 | DRG: 885 | Disposition: A | Discharge status: Home / Self Care | Payer: Medicare Other | Source: Ambulatory Visit | Attending: Psychiatry | Admitting: Psychiatry

## 2011-08-05 ENCOUNTER — Encounter (HOSPITAL_COMMUNITY): Payer: Self-pay | Admitting: *Deleted

## 2011-08-05 DIAGNOSIS — F32A Depression, unspecified: Secondary | ICD-10-CM

## 2011-08-05 DIAGNOSIS — F17200 Nicotine dependence, unspecified, uncomplicated: Secondary | ICD-10-CM

## 2011-08-05 DIAGNOSIS — F411 Generalized anxiety disorder: Secondary | ICD-10-CM

## 2011-08-05 DIAGNOSIS — Z79899 Other long term (current) drug therapy: Secondary | ICD-10-CM | POA: Insufficient documentation

## 2011-08-05 DIAGNOSIS — Z87820 Personal history of traumatic brain injury: Secondary | ICD-10-CM | POA: Insufficient documentation

## 2011-08-05 DIAGNOSIS — G43909 Migraine, unspecified, not intractable, without status migrainosus: Secondary | ICD-10-CM | POA: Insufficient documentation

## 2011-08-05 DIAGNOSIS — Z634 Disappearance and death of family member: Secondary | ICD-10-CM

## 2011-08-05 DIAGNOSIS — F329 Major depressive disorder, single episode, unspecified: Secondary | ICD-10-CM

## 2011-08-05 DIAGNOSIS — F332 Major depressive disorder, recurrent severe without psychotic features: Principal | ICD-10-CM

## 2011-08-05 DIAGNOSIS — G40909 Epilepsy, unspecified, not intractable, without status epilepticus: Secondary | ICD-10-CM | POA: Insufficient documentation

## 2011-08-05 DIAGNOSIS — F319 Bipolar disorder, unspecified: Secondary | ICD-10-CM

## 2011-08-05 DIAGNOSIS — F4310 Post-traumatic stress disorder, unspecified: Secondary | ICD-10-CM

## 2011-08-05 DIAGNOSIS — R45851 Suicidal ideations: Secondary | ICD-10-CM

## 2011-08-05 DIAGNOSIS — F3130 Bipolar disorder, current episode depressed, mild or moderate severity, unspecified: Secondary | ICD-10-CM | POA: Insufficient documentation

## 2011-08-05 HISTORY — DX: Unspecified intracranial injury with loss of consciousness status unknown, initial encounter: S06.9XAA

## 2011-08-05 HISTORY — DX: Unspecified convulsions: R56.9

## 2011-08-05 HISTORY — DX: Unspecified intracranial injury with loss of consciousness of unspecified duration, initial encounter: S06.9X9A

## 2011-08-05 LAB — CBC
Hemoglobin: 15 g/dL (ref 13.0–17.0)
MCV: 90 fL (ref 78.0–100.0)
RBC: 4.78 MIL/uL (ref 4.22–5.81)
WBC: 6.8 10*3/uL (ref 4.0–10.5)

## 2011-08-05 LAB — RAPID URINE DRUG SCREEN, HOSP PERFORMED
Amphetamines: NOT DETECTED
Barbiturates: NOT DETECTED
Benzodiazepines: NOT DETECTED
Opiates: NOT DETECTED
Tetrahydrocannabinol: POSITIVE — AB

## 2011-08-05 LAB — COMPREHENSIVE METABOLIC PANEL
Alkaline Phosphatase: 105 U/L (ref 39–117)
BUN: 13 mg/dL (ref 6–23)
CO2: 28 mEq/L (ref 19–32)
Calcium: 9.4 mg/dL (ref 8.4–10.5)
GFR calc Af Amer: >90 mL/min (ref >90)
Glucose, Bld: 87 mg/dL (ref 70–99)
Total Bilirubin: 0.6 mg/dL (ref 0.3–1.2)
Total Protein: 7.1 g/dL (ref 6.0–8.3)

## 2011-08-05 LAB — POCT I-STAT, CHEM 8
BUN: 12 mg/dL (ref 6–23)
Creatinine, Ser: 1 mg/dL (ref 0.50–1.35)
Hemoglobin: 15.3 g/dL (ref 13.0–17.0)
Potassium: 3.7 mEq/L (ref 3.5–5.1)

## 2011-08-05 MED ORDER — PROMETHAZINE HCL 25 MG PO TABS
25.0000 mg | ORAL_TABLET | Freq: Four times a day (QID) | ORAL | Status: DC | PRN
  Filled 2011-08-05: qty 1

## 2011-08-05 MED ORDER — HYDROCHLOROTHIAZIDE 25 MG PO TABS
25.0000 mg | ORAL_TABLET | Freq: Every day | ORAL | Status: DC
  Administered 2011-08-05: 25 mg via ORAL
  Filled 2011-08-05 (×2): qty 1

## 2011-08-05 MED ORDER — FLUOXETINE HCL 40 MG PO CAPS: 40.0000 mg | ORAL_CAPSULE | Freq: Every day | ORAL | Status: DC

## 2011-08-05 MED ORDER — LORAZEPAM 1 MG PO TABS: 1.0000 mg | ORAL_TABLET | Freq: Three times a day (TID) | ORAL | Status: DC | PRN

## 2011-08-05 MED ORDER — PHENYTOIN SODIUM EXTENDED 100 MG PO CAPS
100.0000 mg | ORAL_CAPSULE | Freq: Three times a day (TID) | ORAL | Status: DC
  Administered 2011-08-05 (×2): 100 mg via ORAL
  Filled 2011-08-05 (×3): qty 1

## 2011-08-05 MED ORDER — ZIPRASIDONE HCL 40 MG PO CAPS
40.0000 mg | ORAL_CAPSULE | Freq: Two times a day (BID) | ORAL | Status: DC
  Administered 2011-08-05: 40 mg via ORAL
  Filled 2011-08-05 (×4): qty 1

## 2011-08-05 MED ORDER — LAMOTRIGINE 150 MG PO TABS
150.0000 mg | ORAL_TABLET | Freq: Two times a day (BID) | ORAL | Status: DC
  Administered 2011-08-05 (×2): 150 mg via ORAL
  Filled 2011-08-05 (×3): qty 1

## 2011-08-05 MED ORDER — CLONAZEPAM 0.5 MG PO TABS: 1.0000 mg | ORAL_TABLET | Freq: Three times a day (TID) | ORAL | Status: DC | PRN

## 2011-08-05 MED ORDER — LOVASTATIN ER 20 MG PO TB24: 20.0000 mg | ORAL_TABLET | Freq: Every day | ORAL | Status: DC

## 2011-08-05 MED ORDER — ACETAMINOPHEN 325 MG PO TABS: 650.0000 mg | ORAL_TABLET | ORAL | Status: DC | PRN

## 2011-08-05 MED ORDER — METOCLOPRAMIDE HCL 5 MG/ML IJ SOLN
10.0000 mg | Freq: Once | INTRAMUSCULAR | Status: AC
  Administered 2011-08-05: 10 mg via INTRAVENOUS
  Filled 2011-08-05: qty 2

## 2011-08-05 MED ORDER — KETOROLAC TROMETHAMINE 30 MG/ML IJ SOLN
30.0000 mg | Freq: Once | INTRAMUSCULAR | Status: AC
  Administered 2011-08-05: 30 mg via INTRAVENOUS
  Filled 2011-08-05: qty 1

## 2011-08-05 MED ORDER — FLUOXETINE HCL 20 MG PO CAPS
40.0000 mg | ORAL_CAPSULE | Freq: Every day | ORAL | Status: DC
  Administered 2011-08-05: 40 mg via ORAL
  Filled 2011-08-05 (×2): qty 2

## 2011-08-05 MED ORDER — SODIUM CHLORIDE 0.9 % IV SOLN
INTRAVENOUS | Status: DC
  Administered 2011-08-05: 09:00:00 via INTRAVENOUS

## 2011-08-05 MED ORDER — PROMETHAZINE HCL 25 MG PO TABS: 25.0000 mg | ORAL_TABLET | Freq: Four times a day (QID) | ORAL | Status: DC | PRN

## 2011-08-05 MED ORDER — PROMETHAZINE HCL 25 MG/ML IJ SOLN
25.0000 mg | Freq: Once | INTRAMUSCULAR | Status: AC
  Administered 2011-08-05: 25 mg via INTRAVENOUS
  Filled 2011-08-05: qty 1

## 2011-08-05 MED ORDER — SIMVASTATIN 10 MG PO TABS
10.0000 mg | ORAL_TABLET | Freq: Every day | ORAL | Status: DC
  Filled 2011-08-05: qty 1

## 2011-08-05 MED ORDER — IBUPROFEN 200 MG PO TABS: 600.0000 mg | ORAL_TABLET | Freq: Three times a day (TID) | ORAL | Status: DC | PRN

## 2011-08-05 MED ORDER — LEVETIRACETAM 250 MG PO TABS: 250.0000 mg | ORAL_TABLET | ORAL | Status: DC

## 2011-08-05 NOTE — ED Provider Notes (Signed)
History     CSN: [id number]  Arrival date & time 08/05/11  1215   First MD Initiated Contact with Patient 08/05/11 1341      Chief Complaint  Patient presents with  . Psychiatric Evaluation    (Consider location/radiation/quality/duration/timing/severity/associated sxs/prior treatment) HPI Patient presents to the emergency room with complaints of depression. Patient states he is here visiting family. He has been depressed about home and family situation. Patient states he is starting to feel suicidal with thoughts  of hurting himself. He denies any specific plan. Patient has history of bipolar disorder and has had this trouble in the past. Psychiatrist is several hours away so he came to the emergency room. Past Medical History  Diagnosis Date  . Seizures   . Traumatic brain injury     Past Surgical History  Procedure Date  . Brain surgery     History reviewed. No pertinent family history.  History  Substance Use Topics  . Smoking status: Current Everyday Smoker    Types: Cigarettes  . Smokeless tobacco: Not on file  . Alcohol Use:       Review of Systems  All other systems reviewed and are negative.    Allergies  Iodine  Home Medications   Current Outpatient Rx  Name Route Sig Dispense Refill  . CLONAZEPAM 1 MG PO TABS Oral Take 1 mg by mouth 3 (three) times daily as needed. For anxiety    . FLUOXETINE HCL 40 MG PO CAPS Oral Take 40 mg by mouth daily.    Marland Kitchen HYDROCHLOROTHIAZIDE 25 MG PO TABS Oral Take 25 mg by mouth daily.    Marland Kitchen LAMOTRIGINE 100 MG PO TABS Oral Take 150 mg by mouth 2 (two) times daily.    Marland Kitchen LEVETIRACETAM 250 MG PO TABS Oral Take 250-500 mg by mouth as directed. 250 every morning and 500 every night    . LOVASTATIN ER 20 MG PO TB24 Oral Take 20 mg by mouth daily before breakfast.     . PHENYTOIN SODIUM EXTENDED 100 MG PO CAPS Oral Take by mouth 3 (three) times daily.    Marland Kitchen PROMETHAZINE HCL 25 MG PO TABS Oral Take 25 mg by mouth every 6 (six) hours  as needed. For nausea    . ZIPRASIDONE HCL 40 MG PO CAPS Oral Take 40 mg by mouth 2 (two) times daily with a meal.      BP 126/80  Pulse 63  Temp(Src) 97.8 F (36.6 C) (Oral)  Resp 16  SpO2 98%  Physical Exam  Nursing note and vitals reviewed. Constitutional: He appears well-developed and well-nourished. No distress.  HENT:  Head: Normocephalic and atraumatic.  Right Ear: External ear normal.  Left Ear: External ear normal.  Eyes: Conjunctivae are normal. Right eye exhibits no discharge. Left eye exhibits no discharge. No scleral icterus.  Neck: Neck supple. No tracheal deviation present.  Cardiovascular: Normal rate.   Pulmonary/Chest: Effort normal. No stridor. No respiratory distress.  Musculoskeletal: He exhibits no edema.  Neurological: He is alert. Cranial nerve deficit: no gross deficits.  Skin: Skin is warm and dry. No rash noted.  Psychiatric: His speech is not rapid and/or pressured and not delayed. He is withdrawn. He exhibits a depressed mood.    ED Course  Procedures (including critical care time)   Labs Reviewed  URINE RAPID DRUG SCREEN (HOSP PERFORMED)  CBC  ETHANOL  COMPREHENSIVE METABOLIC PANEL   No results found.    MDM  Patient with complaints of depression and  suicidal ideation. Incidentally he had been seen earlier this morning for her headache. Patient did not mention depression issues at that time. Patient will be moved to the main ED last week the act team to evaluate the patient        Celene Kras, MD 08/05/11 [phone]

## 2011-08-05 NOTE — ED Notes (Signed)
Pt requested and given [place] sandwich, applesauce, and drink.

## 2011-08-05 NOTE — ED Notes (Signed)
Pt alert, nad, ambulates to ER, c/o migraine h/a, onset three days ago, denies n/v, skin pwd, resp even unlabored

## 2011-08-05 NOTE — ED Notes (Signed)
House Coverage aware of patient
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
## 2011-08-05 NOTE — ED Notes (Signed)
Coke requested and provided

## 2011-08-05 NOTE — ED Provider Notes (Addendum)
History     CSN: [id number]  Arrival date & time 08/05/11  0411   First MD Initiated Contact with Patient 08/05/11 0725      Chief Complaint  Patient presents with  . Migraine    (Consider location/radiation/quality/duration/timing/severity/associated sxs/prior treatment) Patient is a 26 y.o. male presenting with migraine. The history is provided by the patient.  Migraine Associated symptoms include headaches. Pertinent negatives include no chest pain and no abdominal pain.   the patient is a 26 year old male, with a history of migraine headaches, and epilepsy.  He reports 3 days with a migraine headache.  The headache is associated with nausea and photophobia.  He has not had a fever, or rash or neck pain.  He denies pain anywhere else.  Past Medical History  Diagnosis Date  . Seizures   . Traumatic brain injury     Past Surgical History  Procedure Date  . Brain surgery     No family history on file.  History  Substance Use Topics  . Smoking status: Current Everyday Smoker    Types: Cigarettes  . Smokeless tobacco: Not on file  . Alcohol Use:       Review of Systems  Constitutional: Negative for fever.  HENT: Negative for sore throat.   Eyes: Positive for photophobia.  Cardiovascular: Negative for chest pain.  Gastrointestinal: Negative for abdominal pain.  Skin: Negative for rash.  Neurological: Positive for headaches. Negative for weakness and numbness.  Psychiatric/Behavioral: Negative for confusion.  All other systems reviewed and are negative.    Allergies  Iodine  Home Medications   Current Outpatient Rx  Name Route Sig Dispense Refill  . FLUOXETINE HCL 40 MG PO CAPS Oral Take 40 mg by mouth daily.    Marland Kitchen HYDROCHLOROTHIAZIDE 25 MG PO TABS Oral Take 25 mg by mouth daily.    Marland Kitchen LAMOTRIGINE 100 MG PO TABS Oral Take 150 mg by mouth 2 (two) times daily.    Marland Kitchen LEVETIRACETAM 250 MG PO TABS Oral Take 250 mg by mouth every 12 (twelve) hours.    Marland Kitchen LOVASTATIN  ER 20 MG PO TB24 Oral Take 20 mg by mouth at bedtime.    Marland Kitchen PHENYTOIN SODIUM EXTENDED 100 MG PO CAPS Oral Take by mouth 3 (three) times daily.    Marland Kitchen ZIPRASIDONE HCL 40 MG PO CAPS Oral Take 40 mg by mouth 2 (two) times daily with a meal.      BP 151/91  Pulse 79  Temp 98 F (36.7 C)  Resp 16  Wt 240 lb (108.863 kg)  SpO2 99%  Physical Exam  Vitals reviewed. Constitutional: He is oriented to person, place, and time. He appears well-developed and well-nourished. No distress.  HENT:  Head: Normocephalic and atraumatic.  Eyes: EOM are normal. Pupils are equal, round, and reactive to light.  Neck: Normal range of motion. Neck supple.  Cardiovascular: Normal rate, regular rhythm and normal heart sounds.   No murmur heard. Pulmonary/Chest: Effort normal and breath sounds normal. No respiratory distress. He has no wheezes. He has no rales.  Abdominal: Bowel sounds are normal. He exhibits no distension.  Musculoskeletal: Normal range of motion. He exhibits no edema and no tenderness.  Neurological: He is alert and oriented to person, place, and time. He has normal strength. No cranial nerve deficit. Coordination normal.  Skin: Skin is warm and dry. He is not diaphoretic.  Psychiatric: He has a normal mood and affect. His behavior is normal.    ED Course  Procedures (including critical care time) 26 year old male, with migraines and epilepsy, presents with his typical migraine characterized by throbbing headache, nausea, and photophobia.  There is no alteration in mental status neurological deficits or signs of CNS or systemic infection.  There is no indication for CAT scan or laboratory testing.  At this time.  Labs Reviewed - No data to display No results found.   No diagnosis found.  8:58 AM I had to wake pt up.  He says " my head hurts."  10:06 AM Sleeping again. Wakes up and says head hurts still.  Will release.  Says he does not need note for school or work.   MDM   Migraine No neurological deficit.  No alteration in mental status.  No signs of CNS or systemic infection        Nicholes Stairs, MD 08/05/11 1006  Nicholes Stairs, MD 08/05/11 1009

## 2011-08-05 NOTE — ED Notes (Signed)
Patient resting with NAD at this time. Sitter at bedside. 

## 2011-08-05 NOTE — ED Notes (Signed)
Sitter at bedside.

## 2011-08-05 NOTE — ED Notes (Signed)
Return call from iv team.

## 2011-08-05 NOTE — ED Notes (Signed)
Pt left ed before signing out.

## 2011-08-05 NOTE — ED Notes (Signed)
Per ACT team Patient has been seen and waiting to [facility] decisions for placement.

## 2011-08-05 NOTE — ED Notes (Signed)
Patient resting and given a coke and remote for TV. NAD at this time. Clothing inventoried and placed in locker #4.

## 2011-08-05 NOTE — ED Notes (Signed)
Iv nurse paged

## 2011-08-05 NOTE — ED Notes (Signed)
To ed for extreme depression. States he is not suicidal 'yet' but knows that is his next step. States he has attempted suicide before. Calm and cooperative. Appears in nad

## 2011-08-05 NOTE — ED Notes (Signed)
Accepted to [facility]--Dr. S. Arseen---room 503.

## 2011-08-05 NOTE — ED Notes (Signed)
Voluntary form signed.

## 2011-08-05 NOTE — ED Notes (Signed)
2 unsuccessful iv attempts.

## 2011-08-05 NOTE — ED Notes (Signed)
Reg no sharps dinner req 

## 2011-08-06 ENCOUNTER — Encounter (HOSPITAL_COMMUNITY): Payer: Self-pay

## 2011-08-06 DIAGNOSIS — F332 Major depressive disorder, recurrent severe without psychotic features: Principal | ICD-10-CM

## 2011-08-06 DIAGNOSIS — G40909 Epilepsy, unspecified, not intractable, without status epilepticus: Secondary | ICD-10-CM | POA: Insufficient documentation

## 2011-08-06 DIAGNOSIS — F411 Generalized anxiety disorder: Secondary | ICD-10-CM | POA: Diagnosis present

## 2011-08-06 DIAGNOSIS — F319 Bipolar disorder, unspecified: Secondary | ICD-10-CM | POA: Diagnosis present

## 2011-08-06 DIAGNOSIS — F4310 Post-traumatic stress disorder, unspecified: Secondary | ICD-10-CM | POA: Diagnosis present

## 2011-08-06 LAB — HEPATIC FUNCTION PANEL
ALT: 25 U/L (ref 0–53)
AST: 15 U/L (ref 0–37)
Albumin: 4.1 g/dL (ref 3.5–5.2)
Alkaline Phosphatase: 101 U/L (ref 39–117)
Bilirubin, Direct: <0.1 mg/dL (ref 0.0–0.3)
Total Bilirubin: 0.1 mg/dL — ABNORMAL LOW (ref 0.3–1.2)
Total Protein: 7.1 g/dL (ref 6.0–8.3)

## 2011-08-06 LAB — PHENYTOIN LEVEL, TOTAL: Phenytoin Lvl: 9.2 ug/mL — ABNORMAL LOW (ref 10.0–20.0)

## 2011-08-06 MED ORDER — MAGNESIUM HYDROXIDE 400 MG/5ML PO SUSP: 30.0000 mL | Freq: Every day | ORAL | Status: DC | PRN

## 2011-08-06 MED ORDER — FLUOXETINE HCL 20 MG PO CAPS
40.0000 mg | ORAL_CAPSULE | Freq: Every day | ORAL | Status: DC
  Administered 2011-08-06 – 2011-08-07 (×2): 40 mg via ORAL
  Filled 2011-08-06 (×3): qty 2

## 2011-08-06 MED ORDER — GABAPENTIN 100 MG PO CAPS
100.0000 mg | ORAL_CAPSULE | Freq: Three times a day (TID) | ORAL | Status: DC
  Administered 2011-08-06 – 2011-08-07 (×3): 100 mg via ORAL
  Filled 2011-08-06 (×8): qty 1

## 2011-08-06 MED ORDER — PHENYTOIN SODIUM EXTENDED 100 MG PO CAPS
100.0000 mg | ORAL_CAPSULE | Freq: Three times a day (TID) | ORAL | Status: DC
  Administered 2011-08-06 – 2011-08-11 (×15): 100 mg via ORAL
  Filled 2011-08-06 (×18): qty 1

## 2011-08-06 MED ORDER — TRAZODONE HCL 100 MG PO TABS
100.0000 mg | ORAL_TABLET | Freq: Every evening | ORAL | Status: DC | PRN
  Administered 2011-08-06: 100 mg via ORAL
  Filled 2011-08-06: qty 1

## 2011-08-06 MED ORDER — ALUM & MAG HYDROXIDE-SIMETH 200-200-20 MG/5ML PO SUSP: 30.0000 mL | ORAL | Status: DC | PRN

## 2011-08-06 MED ORDER — ACETAMINOPHEN 325 MG PO TABS: 650.0000 mg | ORAL_TABLET | Freq: Four times a day (QID) | ORAL | Status: DC | PRN

## 2011-08-06 MED ORDER — NICOTINE 21 MG/24HR TD PT24
21.0000 mg | MEDICATED_PATCH | Freq: Every day | TRANSDERMAL | Status: DC
  Administered 2011-08-06 – 2011-08-13 (×8): 21 mg via TRANSDERMAL
  Filled 2011-08-06 (×9): qty 1

## 2011-08-06 MED ORDER — LEVETIRACETAM 250 MG PO TABS
250.0000 mg | ORAL_TABLET | ORAL | Status: DC
  Administered 2011-08-06 – 2011-08-13 (×8): 250 mg via ORAL
  Filled 2011-08-06 (×9): qty 1

## 2011-08-06 MED ORDER — LEVETIRACETAM ER 500 MG PO TB24
500.0000 mg | ORAL_TABLET | Freq: Every day | ORAL | Status: DC
  Administered 2011-08-06 – 2011-08-12 (×7): 500 mg via ORAL
  Filled 2011-08-06 (×9): qty 1

## 2011-08-06 MED ORDER — LAMOTRIGINE 150 MG PO TABS
150.0000 mg | ORAL_TABLET | Freq: Two times a day (BID) | ORAL | Status: DC
  Administered 2011-08-06 – 2011-08-11 (×11): 150 mg via ORAL
  Filled 2011-08-06: qty 1
  Filled 2011-08-06 (×2): qty 6
  Filled 2011-08-06: qty 1
  Filled 2011-08-06: qty 6
  Filled 2011-08-06 (×10): qty 1
  Filled 2011-08-06: qty 6
  Filled 2011-08-06: qty 1

## 2011-08-06 MED ORDER — PNEUMOCOCCAL VAC POLYVALENT 25 MCG/0.5ML IJ INJ: 0.5000 mL | INJECTION | INTRAMUSCULAR | Status: AC

## 2011-08-06 NOTE — Progress Notes (Signed)
Patient ID: Arthur Singleton, male   DOB: 21-Dec-1985, 26 y.o.   MRN: [id number] 22 yr. Old single, gay, white male admitted for depression and suicidal ideations.  Contracts for safety. Stressors are death of partner in 01-03-11 and recent deaths af four family members.

## 2011-08-06 NOTE — Tx Team (Addendum)
Initial Interdisciplinary Treatment Plan  PATIENT STRENGTHS: (choose at least two) Ability for insight Average or above average intelligence Capable of independent living  PATIENT STRESSORS: Death of partner and four other family members  PROBLEM LIST: Problem List/Patient Goals Date to be addressed Date deferred Reason deferred Estimated date of resolution  Depression 08/05/11     Suicidal ideations 08/05/11     "Depression stabilization" 08/05/11     "Grief counseling" 08/05/11     Coping skills' 08/05/11     Status post TBI in 2000 08/05/11                        DISCHARGE CRITERIA:  Ability to meet basic life and health needs Improved stabilization in mood, thinking, and/or behavior Medical problems require only outpatient monitoring Motivation to continue treatment in a less acute level of care Need for constant or close observation no longer present Reduction of life-threatening or endangering symptoms to within safe limits Verbal commitment to aftercare and medication compliance  PRELIMINARY DISCHARGE PLAN: Attend aftercare/continuing care group Participate in family therapy Placement in alternative living arrangements  PATIENT/FAMIILY INVOLVEMENT: This treatment plan has been presented to and reviewed with the patient, Arthur Singleton, and/or family member, .  The patient and family have been given the opportunity to ask questions and make suggestions.  Alicia Amel 08/06/2011, 1:52 AM

## 2011-08-06 NOTE — Progress Notes (Signed)
[facility] Group Notes: (Counselor/Nursing/MHT/Case Management/Adjunct) 08/06/2011   @  11:00am   Type of Therapy:  Group Therapy  Participation Level:  Active  Participation Quality:  Attentive, Appropriate, Sharing  Affect:  Appropriate  Cognitive:  Appropriate  Insight:  Good  Engagement in Group: Good   Engagement in Therapy:  Good  Modes of Intervention:  Support and Exploration  Summary of Progress/Problems: Arthur Singleton processed his feelings of imbalance reporting that so many things have happened lately to throw him out of balance. 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 Arthur Singleton explored ways to integrate the spiritual, mental/emotional, and physical in a way that brings balance, and stated that both yoga and meditation are ways that have helped him in the past. He specifically stated that DBT Meditations have ben helpful to him.    [name] 07/30/2011  3:56 PM

## 2011-08-06 NOTE — Progress Notes (Signed)
Pt is bright in affect and anxious in mood. Pt presents this evening with complaints of some anxiety and insomnia. Pt has been ordered trazodone. Trazodone was effective for this pt. Pt attended karaoke this evening and is observed interacting well with others. Pt is compliant with his tx. Pt denies SI/HI at this time. Pt safety remains with q62min checks.

## 2011-08-06 NOTE — Progress Notes (Signed)
[facility] Group Notes: (Counselor/Nursing/MHT/Case Management/Adjunct) 08/06/2011   @1 :15pm  Type of Therapy:  Group Therapy  Participation Level:  Active  Participation Quality:  Attentive, Sharing, Supportive  Affect:  Appropriate  Cognitive:  Appropriate  Insight:  Good  Engagement in Group: Good   Engagement in Therapy:  Good  Modes of Intervention:  Support and Exploration  Summary of Progress/Problems: Arthur Singleton was open and insightful in discussion of what makes a person "good". He identified that many of the characteristics discussed he sees in himself, and therefore it would be appropriate to say he is a good person. He identified that in our negativistic society, it is important to search out and recognize one's positive traits.   Arthur Singleton 08/06/2011 3:56 PM

## 2011-08-06 NOTE — Progress Notes (Signed)
Pt rates depression at a 8 and hopelessness at a 6. Pt attends groups and interacts well with peers and staff. Pt was offered support and encouragement. Pt denies SI/HI. Pt is receptive to treatment and safety is maintained on unit.

## 2011-08-06 NOTE — Tx Team (Addendum)
Interdisciplinary Treatment Plan Update (Adult)  Date:  08/06/2011  Time Reviewed:  10:30 AM   Progress in Treatment: Attending groups:   Yes   Participating in groups:  Yes Taking medication as prescribed:  Yes Tolerating medication:  Yes Family/Significant othe contact made: Counselor to make contact with family Patient understands diagnosis:  Yes Discussing patient identified problems/goals with staff: Yes Medical problems stabilized or resolved: Yes Denies suicidal/homicidal ideation:Yes Issues/concerns per patient self-inventory: None identified Other:  New problem(s) identified:  Reason for Continuation of Hospitalization: Anxiety Depression Medication stabilization  Interventions implemented related to continuation of hospitalization: MD to make changes to medications Will start Tegretol and discharge Klonopin  Medication Management; safety checks q 15 mins  Additional comments:  Estimated length of stay: 3-5 days  Discharge Plan:  Home with friend with outpatient follow up  New goal(s):  Review of initial/current patient goals per problem list:    1.  Goal(s): Eliminate SI   Met:  Yes  Target date: d/c  As evidenced by:  Currently denies SI - Endorsed passive SI prior to admision  2.  Goal (s):Reduce anxiety/depression and hopelessness  Met:  No  Target date: d/c  As evidenced by: Lower rating on scales - Currently rating anxiety at six and Depression/hopelessness at eight   3.  Goal(s):  Stabilize on medications  Met:  No  Target date: d/c  As evidenced by: Arthur Singleton will report medications are working - symptoms decreased  4.  Goal(s):  Refer for outpatient f/u for therapy and grief counseling  Met:  No  Target date: d/c  As evidenced by: Follow up will be scheduled with local providers.  Arthur Singleton has relocated from [facility]   Attendees: Patient:  Arthur Singleton 1/10/201311:35 AM   Family:     Physician:  Orson Aloe,  MD 08/06/2011 10:30 AM   Nursing:   Baird Cancer, Rn 08/06/2011 10:30 AM   CaseManager:  Juline Patch, LCSW 08/06/2011 10:30 AM   Counselor:  Angus Palms, LCSW 08/06/2011 10:30 AM   Other:  Consuello Bossier, NP 08/06/2011 10:30 AM   Other:  Estevan Ryder, LCSWA 08/06/2011  10:30 AM   Other:     Other:      Scribe for Treatment Team:   Wynn Banker, LCSW,  08/06/2011 10:30 AM
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
## 2011-08-07 MED ORDER — LORAZEPAM 1 MG PO TABS
2.0000 mg | ORAL_TABLET | ORAL | Status: AC
  Administered 2011-08-07: 2 mg via ORAL

## 2011-08-07 MED ORDER — LORAZEPAM 1 MG PO TABS
2.0000 mg | ORAL_TABLET | Freq: Two times a day (BID) | ORAL | Status: DC | PRN
  Administered 2011-08-07 – 2011-08-11 (×8): 2 mg via ORAL
  Filled 2011-08-07 (×9): qty 2

## 2011-08-07 MED ORDER — LORAZEPAM 1 MG PO TABS
ORAL_TABLET | ORAL | Status: AC
  Administered 2011-08-07: 2 mg via ORAL
  Filled 2011-08-07: qty 2

## 2011-08-07 MED ORDER — MIRTAZAPINE 7.5 MG PO TABS
7.5000 mg | ORAL_TABLET | Freq: Every day | ORAL | Status: DC
  Administered 2011-08-07 – 2011-08-10 (×4): 7.5 mg via ORAL
  Filled 2011-08-07 (×5): qty 1

## 2011-08-07 MED ORDER — FLUOXETINE HCL 20 MG PO CAPS
60.0000 mg | ORAL_CAPSULE | Freq: Every day | ORAL | Status: DC
  Administered 2011-08-08 – 2011-08-13 (×6): 60 mg via ORAL
  Filled 2011-08-07 (×7): qty 3

## 2011-08-07 NOTE — Progress Notes (Signed)
[facility] Group Notes: (Counselor/Nursing/MHT/Case Management/Adjunct) 08/07/2011   @1 :15pm  Type of Therapy:  Group Therapy  Participation Level:  Active  Participation Quality:  Attentive, Sharing  Affect:  Appropriate  Cognitive:  Appropriate  Insight:  Limited  Engagement in Group: Good  Engagement in Therapy:  Good  Modes of Intervention:  Support and Exploration  Summary of Progress/Problems: Arthur Singleton participated in progressive muscle relaxation exercise and processed his experience. He stated that the exercise was helpful to him in relaxing.   [name] 08/07/2011 3:32 PM

## 2011-08-07 NOTE — Progress Notes (Signed)
Recreation Therapy Notes  08/07/2011         Time: 16109      Group Topic/Focus: The focus of this group is on enhancing the patient's understanding of leisure, barriers to leisure, and the importance of engaging in positive leisure activities upon discharge for improved total health.  Participation Level: Active  Participation Quality: Intrusive and Monopolizing  Affect: Excited  Cognitive: Oriented   Additional Comments: Patient requiring frequent redirection, and still not able to stay on task at times. Patient making sexually inappropriate with jokes, talking over others, having side conversations during group. When RT corrected patient, he was not receptive   Fe Okubo 08/07/2011 3:55 PM

## 2011-08-07 NOTE — Progress Notes (Signed)
Patient seen during d/c planning group.  He advised that Prozac is doing a good job.  He continues to rate depression at eight, anxiety and hopelessness at six and helplessness at three.  He reported had a mild heart attack and has pneumonia and that her illness is troubling to him due to all the recent deaths that have occurred.

## 2011-08-07 NOTE — Progress Notes (Addendum)
Pt denies SI/HI/AVH and has attended and participated in groups.  Upset earlier about his mother and father's health, father was admitted to the hospital today.  The NP ordered him a one time dose of 2 mg of ativan, given with relief.  Pt has lost many people in his life and grief counseling was discussed in treatment team today.  He is being very supportive of his parents and family despite the fact that they were a source of stress for him in the past.

## 2011-08-07 NOTE — Progress Notes (Signed)
[facility] Group Notes: (Counselor/Nursing/MHT/Case Management/Adjunct) 08/07/2011   @  11:00am   Type of Therapy:  Group Therapy  Participation Level:  Active  Participation Quality:  Attentive, Appropriate, Sharing, Supportive  Affect:  Appropriate  Cognitive:  Appropriate  Insight:  Good  Engagement in Group: Good  Engagement in Therapy:  Good  Modes of Intervention:  Support and Exploration  Summary of Progress/Problems: Arthur Singleton explored the concept of "feeding the monkeys" (embracing the distracting and unhealthy thoughts that cross one's mind). He shared examples of times when "monkeys" have distracted him from his recovery, such as his parents' health this morning. Arthur Singleton stated that processing his feelings and getting reassurance and support helped him not to feed that monkey, but to deal with it and move back to focusing on his own recovery. He helped other group members understand the concept, and accepted challenge that although he no longer uses cocaine, his occasional marijuana use feeds his addiction and could lead him to relapse.  [name] 08/07/2011 2:42 PM

## 2011-08-07 NOTE — Progress Notes (Signed)
Pt. Reports that his mother and father are in the hospital for pneumonia.  Reports that he is depressed because of their hospitalization. Denies SI/HI and denies A/V hallucinations.  Encouragement and support given.  Pt. Receptive.

## 2011-08-08 NOTE — Progress Notes (Signed)
Patient ID: Arthur Singleton, male   DOB: [birth year], 26 y.o.   MRN: [id number]   Patient was seen by writer and introduced self as his nurse for the shift. Patient was informed of his scheduled medications and inquired if he could have his prn of ativan 30 minutes early because his previous nurse mentioned that he could get this thirty minutes early. Writer encouraged patient to try and use distraction and if necessary this would be given. Pt reported that he and found out some more news concerning his moms health. Patient was supported and encouraged, denies having pain, -si/hi/a/v hall. Safety maintained on unit will continue to monitor.

## 2011-08-08 NOTE — Progress Notes (Signed)
Patient ID: Arthur Singleton, male   DOB: [birth year], 26 y.o.   MRN: [id number]  S/O: Pt seen and evaluated.  Chart reviewed.  Discussed with physician extender.  Pt seen and evaluated by Dr. Dan Humphreys upon admission [facility] pending from Dr. Dan Humphreys).  Patient stated that his mood was "not good".  Agitation earlier today in light of both parents being hospitalized for reported pneumonia.   His affect was mood congruent and irritable. He denied any current thoughts of self injurious behavior, suicidal ideation or homicidal ideation. There were no auditory or visual hallucinations, paranoia, delusional thought processes, or mania noted.  Thought process was linear and goal directed.  No psychomotor agitation or retardation was noted. His speech was normal rate, tone and volume. Eye contact was good. Judgment and insight are fair.  Patient has been up and engaged on the unit.  No safety concerns reported from team.    Pt stated that he wanted his meds fixed and we reviewed them all in great detail.  Agreed on the following:  Meds:   . FLUoxetine  60 mg Oral Daily  . lamoTRIgine  150 mg Oral BID  . levETIRAcetam  500 mg Oral QHS  . levETIRAcetam  250 mg Oral Q0700  . mirtazapine  7.5 mg Oral QHS  . nicotine  21 mg Transdermal Daily  . phenytoin  100 mg Oral TID  . pneumococcal 23 valent vaccine  0.5 mL Intramuscular Tomorrow-1000  . DISCONTD: FLUoxetine  40 mg Oral Daily  . DISCONTD: gabapentin  100 mg Oral TID   A/P:  Pt with sig psychosocial issues and continued report of depressive s/s.  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 S/p TBI.    Patient Active Problem List  Diagnoses  . PTSD (post-traumatic stress disorder)  . Bipolar 1 disorder  . Generalized anxiety disorder  . Seizure disorder       Pt in need of continued crisis stabilization and treatment.  Please see orders.   Medications reviewed with pt and medication education provided.  Will  continue q15 minute checks per unit protocol.  No clinical indication for one on one level of observation at this time.  Pt contracting for safety.  Mental health treatment, medication management and therapy will mitigate against the increased future risk of harm to self and/or others.  Pt agreeable with the plan.  Discussed with the team.

## 2011-08-08 NOTE — Progress Notes (Signed)
  Arthur Singleton is a 26 y.o. male [id number] [birth year]  08/05/2011 Principal Problem:  *Bipolar 1 disorder Active Problems:  PTSD (post-traumatic stress disorder)  Generalized anxiety disorder   Mental Status: Sleepy but was appropriate. Denies active SI/HI/AVH.    Subjective/Objective: Still feels alone but is safe in this environment.     Filed Vitals:   08/08/11 0705  BP: 151/105  Pulse: 76  Temp:   Resp:     Lab Results:   BMET    Component Value Date/Time   NA 141 08/05/2011 1408   K 3.7 08/05/2011 1408   CL 102 08/05/2011 1408   CO2 28 08/05/2011 1354   GLUCOSE 89 08/05/2011 1408   BUN 12 08/05/2011 1408   CREATININE 1.00 08/05/2011 1408   CALCIUM 9.4 08/05/2011 1354   GFRNONAA >90 08/05/2011 1354   GFRAA >90 08/05/2011 1354    Medications:  Scheduled:     . FLUoxetine  60 mg Oral Daily  . lamoTRIgine  150 mg Oral BID  . levETIRAcetam  500 mg Oral QHS  . levETIRAcetam  250 mg Oral Q0700  . mirtazapine  7.5 mg Oral QHS  . nicotine  21 mg Transdermal Daily  . phenytoin  100 mg Oral TID  . pneumococcal 23 valent vaccine  0.5 mL Intramuscular Tomorrow-1000  . DISCONTD: FLUoxetine  40 mg Oral Daily  . DISCONTD: gabapentin  100 mg Oral TID     PRN Meds acetaminophen, alum & mag hydroxide-simeth, LORazepam, magnesium hydroxide, DISCONTD: traZODone  Plan: continue current plan of care.  Cully Luckow,MICKIE D. 08/08/2011

## 2011-08-08 NOTE — Progress Notes (Signed)
Patient ID: Arthur Singleton, male   DOB: 08/20/1985, 26 y.o.   MRN: [id number] 08/08/2011 1530 D Arthur Singleton is getting adjusted to the milieu..he is out..interacting with the staff and with his peers, appropriately.  He completed his self inventory and on it he wrote that he denied SI, he wrote his feelings of depression and hopelessness were " 7 / 5 ", respectively and  his DC plan is to do therapy and work on his coping skills. A He is compliant with his meds and his therapies,, is engaged in his POC and asks appropriate questions. R Safety is maintaiend and POC includes fostering therapeutic relationship PD RN [facility]

## 2011-08-08 NOTE — Progress Notes (Signed)
[facility] MD Progress Note  08/08/2011 3:26 AM  Patient reports, "I am having a bad anxiety. My parents are being admitted to the hospital right now.  Both have a lot of health issues. I have called and arranged from here for my father's doctor to go and meet and admit him to the hospital. I have also called my brother to alert him of how things are with our parents. This has made my anxiety worse right now"   Diagnosis:   Axis I: Bipolar disorder, depressed, PTSD., Generalized anxiety disorder. Axis II: Deferred Axis III:  Past Medical History  Diagnosis Date  . Seizures   . Traumatic brain injury    Axis IV: No changes Axis V: 51-60 moderate symptoms  ADL's:  Intact  Sleep:  "I did not sleep well last night at all"  Appetite:  "Eating is good"  Suicidal Ideation:   Plan:  No  Intent:  No  Means:  No  Homicidal Ideation:   Plan:  No  Intent:  No  Means:  No  AEB (as evidenced by):  Mental Status: General Appearance Arthur Singleton:  Casual Eye Contact:  Good Motor Behavior:  Normal Speech:  Normal Level of Consciousness:  Alert Mood:  Anxious Affect:  Flat Anxiety Level:  Moderate Thought Process:  Coherent Thought Content:  WNL Perception:  Normal Judgment:  Fair Insight:  Present Cognition:  Orientation time, place and person Sleep:  Number of Hours: 4   Vital Signs:Blood pressure 117/71, pulse 55, temperature 97.8 F (36.6 C), temperature source Oral, resp. rate 76, height 5\' 8"  (1.727 m), weight 69.854 kg (154 lb).  Lab Results:  Results for orders placed during the hospital encounter of 08/05/11 (from the past 48 hour(s))  HEPATIC FUNCTION PANEL     Status: Abnormal   Collection Time   08/06/11  7:50 PM      Component Value Range Comment   Total Protein 7.1  6.0 - 8.3 (g/dL)    Albumin 4.1  3.5 - 5.2 (g/dL)    AST 15  0 - 37 (U/L)    ALT 25  0 - 53 (U/L)    Alkaline Phosphatase 101  39 - 117 (U/L)    Total Bilirubin 0.1 (*) 0.3 - 1.2 (mg/dL)    Bilirubin, Direct <2.1  0.0 - 0.3 (mg/dL) REPEATED TO VERIFY   Indirect Bilirubin NOT CALCULATED  0.3 - 0.9 (mg/dL)   PHENYTOIN LEVEL, TOTAL     Status: Abnormal   Collection Time   08/06/11  7:54 PM      Component Value Range Comment   Phenytoin Lvl 9.2 (*) 10.0 - 20.0 (ug/mL)     Physical Findings: AIMS:  , ,  ,  ,    CIWA:    COWS:     Treatment Plan Summary: Daily contact with patient to assess and evaluate symptoms and progress in treatment Medication management  Plan: Lorazepam 2 mg x 1 now.           Will re-evaluate current treatment plan with Dr. Doran Heater  And make necessary recommendations           and changes.               Armandina Stammer I 08/08/2011, 3:26 AM

## 2011-08-08 NOTE — Progress Notes (Signed)
[facility] Group Notes:  (Counselor/Nursing/MHT/Case Management/Adjunct)  08/08/2011 12:24 PM  Type of Therapy:  After Care Planning Group  Pt. Attended after care planning group and received  [facility] SI information along with  Crisis and hotline numbers. Pt. Agreed  to use them if needed. Pt. Spoke about his problems with depression and SI and the current illnesses of his parents. Pt. Spoke about his anxiety level being high.  Arthur Singleton [place] 08/08/2011, 12:24 PMI

## 2011-08-09 NOTE — Progress Notes (Addendum)
Patient ID: Arthur Singleton, male   DOB: 13-Nov-1985, 26 y.o.   MRN: [id number] 08/09/2011 Nursing 1730 D [name] has spent most of his time today...sleeping in his bed. He is compliant with his medications. HE keeps up with what time he can have his next dose of ativan prn and jokes with this Clinical research associate about this. He minimizes his issues...speaking about conflict that he has in his family related to the care of his parents . A He completed his self inventory this AM and on it he wrote that he denied SI and  he rated his feelings of depression and hopelessness " 8 / 6 ". R Safety is maintained  aND  POC includes fostering therapeutic real;tionship PD RN [facility]

## 2011-08-09 NOTE — Progress Notes (Signed)
  Arthur Singleton is a 26 y.o. male [id number] [birth year]  08/05/2011 Principal Problem:  *Bipolar 1 disorder Active Problems:  PTSD (post-traumatic stress disorder)  Generalized anxiety disorder   Mental Status :alert and oriented feels depressed today and so has only been to meals otherwise staying in bed and practicing his coping skills.   Subjective/Objective: Had a distressing phone conversation with several  sibs . He says he can't leave treatment now and they want him to come take of their parents both of whom have been hospitalized.Says meds are ok no need to adjust.    Filed Vitals:   08/09/11 0710  BP: 136/80  Pulse: 64  Temp:   Resp:     Lab Results:   BMET    Component Value Date/Time   NA 141 08/05/2011 1408   K 3.7 08/05/2011 1408   CL 102 08/05/2011 1408   CO2 28 08/05/2011 1354   GLUCOSE 89 08/05/2011 1408   BUN 12 08/05/2011 1408   CREATININE 1.00 08/05/2011 1408   CALCIUM 9.4 08/05/2011 1354   GFRNONAA >90 08/05/2011 1354   GFRAA >90 08/05/2011 1354    Medications:  Scheduled:     . FLUoxetine  60 mg Oral Daily  . lamoTRIgine  150 mg Oral BID  . levETIRAcetam  500 mg Oral QHS  . levETIRAcetam  250 mg Oral Q0700  . mirtazapine  7.5 mg Oral QHS  . nicotine  21 mg Transdermal Daily  . phenytoin  100 mg Oral TID     PRN Meds acetaminophen, alum & mag hydroxide-simeth, LORazepam, magnesium hydroxide  Plan: Continue plan of care.  Arthur Singleton,MICKIE D. 08/09/2011

## 2011-08-09 NOTE — Progress Notes (Signed)
[facility] Group Notes:  (Counselor/Nursing/MHT/Case Management/Adjunct)  08/09/2011 6:11 PM  Type of Therapy:  Group Therapy  Pt. Did not attend group due to being asleep.  Arthur Singleton 08/09/2011, 6:11 PM

## 2011-08-09 NOTE — Progress Notes (Signed)
Pt has been in the dayroom playing cards with peers. Writer spoke with patient earlier concerning his medications and informed him that there were no changes to his medications. Pt did request a prn of ativan at 1958 which was given. Patient attended group and has been laughing and talking with select peers in the dayroom. Currently denies having pain, -si/hi/a/v hall. Safety maintained on unit will continue to monitor.

## 2011-08-09 NOTE — Progress Notes (Signed)
D:  Pt expressed he had a bad day today in wrap-up group tonight.  He said he was only out of bed today for his meals.  Pt agreed to set a goal to attend groups tomorrow just needed a break with bad day today.  Pt caught himself and corrected himself when talked about past experiences with drinking.  A:  Encouraged pt to attend groups and continue to support his peers.  R:  Pt is safe. Aundria Rud, Quintel Mccalla L, MHT/NS

## 2011-08-10 NOTE — Progress Notes (Signed)
[facility] Group Notes:  (Counselor/Nursing/MHT/Case Management/Adjunct) 1:15pm   Type of Therapy:  Group Therapy  Participation Level:  Did Not Attend  [name] 08/10/2011  3:34 PM

## 2011-08-10 NOTE — Progress Notes (Signed)
Patient ID: Arthur Singleton, male   DOB: [birth year], 26 y.o.   MRN: [id number] Pt reports fair sleep and good appetite. He reports his energy level is normal and he rates his depression a 7 and hopelessness a 6.  He denies thoughts of harming himself and he denies any pain. He is attending groups and interacting with peers.  He requested ativan 2mg  this am for anxiety.

## 2011-08-10 NOTE — Progress Notes (Signed)
Patient seen during d/c planning group.  Patient reports depression at seven, anxiety at eight, helplessness at six and hopelessness at two.  He denies SI/HI.  Patient shared that both parents being ill and hospitalized has increased his symptoms.

## 2011-08-10 NOTE — Tx Team (Signed)
Interdisciplinary Treatment Plan Update (Adult)  Date:  08/10/2011  Time Reviewed:  10:21 AM   Progress in Treatment: Attending groups: Yes Participating in groups:  Yes, open and insightful Taking medication as prescribed:  Yes Tolerating medication: Yes Family/Significant othe contact made:  No, reports there is no one left to contact. Will ask for consent to speak with cousin who is helping him move.  Patient understands diagnosis: Yes Discussing patient identified problems/goals with staff:  Yes Medical problems stabilized or resolved: Yes Denies suicidal/homicidal ideation: Yes Issues/concerns per patient self-inventory:  No  Other:  New problem(s) identified: None  Reason for Continuation of Hospitalization: Anxiety Depression Medication stabilization  Interventions implemented related to continuation of hospitalization:  Medication stabilization, safety checks q 15 mins, group attendance  Additional comments: Arthur Singleton states that he has just realized the root of his real problems and wants to be open about them. He wrote 4 pages of abbreviated life story for counselor to understand "what started all the issues", which highlight feelings of being uncared for. Treatment team discussed whether some of the emotional turmoil and feelings of abandonment are related to borderline traits.   Estimated length of stay: 2-3 days  Discharge Plan: discharge to live with cousin, follow up with   New goal(s): None  Review of initial/current patient goals per problem list:   1. Goal(s): Eliminate SI  Met: Yes  Target date: by d/c  As evidenced by: Arthur Singleton denies any suicidal or homicidal thoughts   2. Goal (s):Reduce anxiety/depression and hopelessness  Met: No Target date: by d/c  As evidenced by: Arthur Singleton rates his depression at a 7 today (10 on admission)   3. Goal(s): Stabilize on medications  Met: No  Target date: by d/c  As evidenced by: Arthur Singleton is very tearful and reports that  he is in a very bad place today - reports medications help him to cover the symptoms but they are not improved    4. Goal(s): Refer for outpatient f/u for therapy and grief counseling  Met: No  Target date:  By d/c  As evidenced by: Information given to [facility] about Hospice and appointments to be made in [place] area with male providers   Attendees: Patient:   08/10/2011 10:21 AM  Family:   08/10/2011 10:21 AM  Physician:   08/10/2011 10:21 AM  Nursing:   Alease Frame, RN  08/10/2011 10:21 AM  Case Manager:  Juline Patch, LCSW 08/10/2011 10:21 AM  Counselor:  Angus Palms, LCSW 08/10/2011 10:21 AM  Other:  Reyes Ivan, LCSWA 08/10/2011 10:21 AM  Other:  Neill Loft, RN 08/10/2011 10:21 AM  Other:  Serena Colonel, NP 08/10/2011 10:21 AM  Other:   08/10/2011 10:21 AM   Scribe for Treatment Team:   [name], 08/10/2011 10:21 AM

## 2011-08-10 NOTE — Progress Notes (Signed)
[facility] MD Progress Note  08/10/2011 10:52 PM  "I'm feeling depressed still. My father is still hospitalized. My mother is being discharged today. My brother is sick and I am here. I am not feeling well because of all these. I have to be the one to see that everyone in my family gets what they need. My parent's ill health has made my depression worse"  Diagnosis:   Axis I: Major depressive disorder, recurrent Axis II: Borderline traits. Axis III:  Past Medical History  Diagnosis Date  . Seizures   . Traumatic brain injury    Axis IV: No changes Axis V: 41-50 serious symptoms  ADL's:  Intact  Sleep:  Yes,  AEB: 5.75  Appetite:  Yes,  AEB: per patient's report  Suicidal Ideation:   Plan:  No  Intent:  No  Means:  No  Homicidal Ideation:   Plan:  No  Intent:  No  Means:  No  AEB (as evidenced by): per patient's reports  Mental Status: General Appearance Luretha Murphy:  Casual Eye Contact:  Good Motor Behavior:  Normal Speech:  Normal Level of Consciousness:  Alert Mood:  Anxious Affect:  Appropriate Anxiety Level:  Moderate Thought Process:  Coherent Thought Content:  WNL Perception:  Normal Judgment:  Fair Insight:  Present Cognition:  Orientation time, place and person Sleep:  Number of Hours: 5.75   Vital Signs:Blood pressure 138/78, pulse 96, temperature 97.3 F (36.3 C), temperature source Oral, resp. rate 16, height 5\' 8"  (1.727 m), weight 69.854 kg (154 lb).  Lab Results: No results found for this or any previous visit (from the past 48 hour(s)).  Physical Findings: AIMS:  , ,  ,  ,    CIWA:    COWS:     Treatment Plan Summary: Daily contact with patient to assess and evaluate symptoms and progress in treatment Medication management  Plan: Continue current treatment plan.           Encouraged to continue participation in group counseling.  Armandina Stammer I 08/10/2011, 10:52 PM

## 2011-08-10 NOTE — Progress Notes (Addendum)
[facility] Group Notes: (Counselor/Nursing/MHT/Case Management/Adjunct) 08/10/2011   @  11:00am   Type of Therapy:  Group Therapy  Participation Level:  Active  Participation Quality:  Appropriate  Affect:  Depressed, Tearful  Cognitive:  Appropriate  Insight:  Good  Engagement in Group: Limited  Engagement in Therapy:  Good  Modes of Intervention:  Support and Exploration  Summary of Progress/Problems: Arthur Singleton was quiet throughout the beginning of group. Near the end, he processed his experience with his family and how that is the "root" of his mental health problems. Arthur Singleton explored his feelings about his parents and siblings, saying they were never there for him and never gave him the love and acceptance that he needed, but now that they are all sick they are expecting him to take care of them. He acknowledged that his personality is to give to others incessantly, hoping that this will win him love and appreciation, and that he is likely to do the same in this situation even though he knows that he will not win their love. Arthur Singleton was able to recognize that his only healthy relationship has been the one with his deceased partner, and lamented that when he has lost everyone and needs his family most, they have not been there for him. He sees the inequities in the relationship and can acknowledge his resentment and "hatred" toward them for the way he was treated growing up, but is unsure what to do about this. While speaking, Arthur Singleton stated that he is not suicidal, but that sometimes he stays in bed all day because he has nothing to get up for and feels hopeless.   [name] 08/10/2011 12:58 PM \

## 2011-08-10 NOTE — Progress Notes (Signed)
Pt. Lying  In bed reporting that he is very depressed.  Denies SI/HI and denies A/V hallucinations.  Reports that he is tired of being the "go to person" in his family. Stated that he told his brother about his feelings yesterday.  Pt. Did not go to recreational therapy.  Encouragement and support given.  Pt. Receptive.

## 2011-08-11 DIAGNOSIS — Z634 Disappearance and death of family member: Secondary | ICD-10-CM

## 2011-08-11 DIAGNOSIS — F4310 Post-traumatic stress disorder, unspecified: Secondary | ICD-10-CM

## 2011-08-11 DIAGNOSIS — F319 Bipolar disorder, unspecified: Secondary | ICD-10-CM

## 2011-08-11 DIAGNOSIS — F411 Generalized anxiety disorder: Secondary | ICD-10-CM

## 2011-08-11 MED ORDER — LAMOTRIGINE 100 MG PO TABS
150.0000 mg | ORAL_TABLET | ORAL | Status: DC
  Administered 2011-08-11 – 2011-08-13 (×4): 150 mg via ORAL
  Filled 2011-08-11 (×6): qty 1.5

## 2011-08-11 MED ORDER — CLONAZEPAM 1 MG PO TABS
1.0000 mg | ORAL_TABLET | ORAL | Status: DC
  Administered 2011-08-11 – 2011-08-13 (×7): 1 mg via ORAL
  Filled 2011-08-11 (×7): qty 1

## 2011-08-11 MED ORDER — PHENYTOIN SODIUM EXTENDED 100 MG PO CAPS
100.0000 mg | ORAL_CAPSULE | ORAL | Status: DC
  Administered 2011-08-11 – 2011-08-13 (×7): 100 mg via ORAL
  Filled 2011-08-11 (×9): qty 1

## 2011-08-11 MED ORDER — MIRTAZAPINE 15 MG PO TABS
15.0000 mg | ORAL_TABLET | Freq: Every day | ORAL | Status: DC
  Administered 2011-08-11 – 2011-08-12 (×2): 15 mg via ORAL
  Filled 2011-08-11 (×4): qty 1

## 2011-08-11 NOTE — Progress Notes (Signed)
Patient asleep in bed, resting with eyes closed. Appears in no distress. Respirations even, normal and unlabored. Continue monitoring q15 minutes to ensure patient safety while on the unit. 

## 2011-08-11 NOTE — Progress Notes (Addendum)
[facility] Group Notes: (Counselor/Nursing/MHT/Case Management/Adjunct) 08/11/2011   @  11:00am   Type of Therapy:  Group Therapy  Participation Level:  Active  Participation Quality:  Attentive, Appropriate, Sharing  Affect:  Blunted  Cognitive:  Appropriate  Insight:  Limited  Engagement in Group: Good  Engagement in Therapy:  Good  Modes of Intervention:  Support and Exploration  Summary of Progress/Problems: Arthur Singleton was very focused on the problems of others, encouraging them to review how their decisions they have been making have gotten them to this choice and open themselves up to other possibilities. He seemed to really enjoy giving advice and guiding others. Arthur Singleton did not share his own experience, except to say that DBT helped him recognize how to make changes in his life. He pointed out to others what he believes their goals should be, and responded appropriately to a challenge to allow everyone to make their own decisions. Arthur Singleton talked about how a comfort zone keeps one stuck, and how it is scary but necessary to move out of that zone in order to grow.   [name] 08/11/2011  1:07 PM

## 2011-08-11 NOTE — Progress Notes (Signed)
Grief and Loss Group  Group focused on losses related to personal experiences, relationships and identity. There was positive dynamics between group members with most participating in the discussion and providing support to each other. Provided a framework for understanding their experiences as losses and to identify how their grief was being experienced and expressed. Explored some alternatives to dealing with losses and encourage group to continue to explore how to value themselves and believe in their right to have a healthier life.   Pt was attentive and related to some of the experiences of others and some of the emotional reactions of grief which others were also describing.   Carlyle Basques.Div [facility]

## 2011-08-11 NOTE — Progress Notes (Signed)
Patient has been up and in the milieu much of the day.  Did not get up for the first group of the morning, but has made it to most of the other groups today.  Interacting well with staff and peers.  Tolerating medications well.  Requested Xanax this morning prior to being switched to clonazepam.  Medication was effective.  Has been pleasant and cooperative.  Expresses some insight into his problems, but tends to minimize his issues.

## 2011-08-11 NOTE — Progress Notes (Signed)
[facility] MD Progress Note  08/11/2011 2:47 PM  Diagnosis:  Axis I:    Bipolar I Disorder - Depressed.  Generalized Anxiety Disorder. Posttraumatic Stress Disorder Bereavement.  The patient was seen today and reports the following:   ADL's: Intact.  Sleep: The patient reports to having some difficulty initiating and maintaining sleep.  Appetite: The patient reports that his appetite is improving.   Mild>(1-10) >Severe  Hopelessness (1-10): 0  Depression (1-10): 7-8  Anxiety (1-10): 9-10   Suicidal Ideation: The patient denies any suicidal ideations today.  Plan: No  Intent: No  Means: No   Homicidal Ideation: The patient adamantly denies any homicidal ideations today.  Plan: No  Intent: No.  Means: No   General Appearance /Behavior: Casual and cooperative.  Eye Contact: Good.  Speech: Appropriate in rate and volume with no pressuring noted.  Motor Behavior: Appropriate.  Level of Consciousness: Alert and Oriented x 3.  Mental Status: Alert and Oriented x 3.  Mood: Severely Depressed.  Affect: Moderately Constricted.  Anxiety Level: Severely Anxious.  Thought Process: WNL.  Thought Content: The patient denies any auditory or visual hallucinations or delusional thinking today.  Perception:. wnl.  Judgment: Fair to Good.  Insight: Fair to Good.  Cognition: Orientated to time, place and person.  Sleep:  Number of Hours: 5.5   Vital Signs:Blood pressure 137/93, pulse 71, temperature 97.8 F (36.6 C), temperature source Oral, resp. rate 18, height 5\' 8"  (1.727 m), weight 69.854 kg (154 lb).  Lab Results: No results found for this or any previous visit (from the past 48 hour(s)).  Treatment Plan Summary:  1. Daily contact with patient to assess and evaluate symptoms and progress in treatment  2. Medication management  3. The patient will deny suicidal ideations or homicidal ideations for 48 hours prior to discharge and have a depression and anxiety rating of 3 or less. The  patient will also deny any auditory or visual hallucinations or delusional thinking.   Plan:  1. Will continue current medications. Prozac was just recently increased to 60 mgs po q am for depression. 2. Will start Klonopin at 1 mg po q am, 2 pm and 10 pm for severe anxiety. 3. Will increase Remeron to 15 mgs po qhs for sleep and appetite. 4. Will continue to monitor.   Randy Readling 08/11/2011, 2:47 PM

## 2011-08-11 NOTE — Progress Notes (Addendum)
[facility] Group Notes: (Counselor/Nursing/MHT/Case Management/Adjunct) 08/11/2011   @1 :15pm  Type of Therapy:  Group Therapy  Participation Level:  Limited  Participation Quality:  Attentive, Appropriate  Affect:  Blunted  Cognitive:  Appropriate  Insight:  None  Engagement in Group: Limited  Engagement in Therapy:  None  Modes of Intervention:  Support, Education, and Exploration  Summary of Progress/Problems:  Arthur Singleton was attentive and exhibited understanding of dependence and co-dependence in relationships. He seemed to be distracted and commented several times on the temperature of the room rather than engaging in group process.   Arthur Singleton 08/11/2011  2:20 PM

## 2011-08-12 MED ORDER — DOXYCYCLINE HYCLATE 100 MG PO TABS: 100.0000 mg | ORAL_TABLET | Freq: Two times a day (BID) | ORAL | Status: DC

## 2011-08-12 MED ORDER — MINOCYCLINE HCL 100 MG PO CAPS
100.0000 mg | ORAL_CAPSULE | Freq: Once | ORAL | Status: AC
  Administered 2011-08-12: 100 mg via ORAL
  Filled 2011-08-12: qty 1

## 2011-08-12 MED ORDER — MINOCYCLINE HCL 100 MG PO CAPS
100.0000 mg | ORAL_CAPSULE | Freq: Two times a day (BID) | ORAL | Status: DC
  Administered 2011-08-12 – 2011-08-13 (×2): 100 mg via ORAL
  Filled 2011-08-12 (×6): qty 1

## 2011-08-12 NOTE — Progress Notes (Signed)
Recreation Therapy Notes  08/12/2011         Time: 1430      Group Topic/Focus: The focus of the group is on enhancing the patients' ability to cope with stressors. Patients participated in a relaxation exercise with components of guided imagery, progressive muscle relaxation, and positive affirmations.   Participation Level: Minimal  Participation Quality: Resistant  Affect: Depressed  Cognitive: Oriented   Additional Comments: Patient refused to speak during group- wouldn't even tell RT his name.  Kooper Godshall 08/12/2011 4:15 PM

## 2011-08-12 NOTE — Progress Notes (Signed)
[facility] MD Progress Note  08/12/2011 1:28 PM  Pateint reports during rounds of a painful raised bump to inner Rt. thigh. Complained that this happens to his mother a lot and she normally will require antibiotics for it. Assessment reveals a reddened raised boil to inner Rt. thigh. No drainage noted, some swelling present. Patient is afebrile.  Diagnosis:   Axis I: Bipolar, Depressed, Anxiety disorder, PTSD Axis II: Borderline traits Axis III:  Past Medical History  Diagnosis Date  . Seizures   . Traumatic brain injury    Axis IV: No changes. Axis V: 51-60 moderate symptoms  ADL's:  Intact  Sleep:  Yes,  AEB: 5.5  Appetite:  Yes,  AEB: "My appetite is good" Suicidal Ideation:   Plan:  No  Intent:  No  Means:  No  Homicidal Ideation:   Plan:  No  Intent:  No  Means:  No  AEB (as evidenced by):  Mental Status: General Appearance Luretha Murphy:  Casual Eye Contact:  Good Motor Behavior:  Normal Speech:  Normal Level of Consciousness:  Alert Mood:  Euthymic Affect:  Appropriate Anxiety Level:  Minimal Thought Process:  Coherent Thought Content:  WNL Perception:  Normal Judgment:  Fair Insight:  Present Cognition:  Orientation time, place and person Sleep:  Number of Hours: 5.5   Vital Signs:Blood pressure 134/82, pulse 92, temperature 97.2 F (36.2 C), temperature source Oral, resp. rate 20, height 5\' 8"  (1.727 m), weight 69.854 kg (154 lb).  Lab Results: No results found for this or any previous visit (from the past 48 hour(s)).  Physical Findings: AIMS:  , ,  ,  ,    CIWA:    COWS:     Treatment Plan Summary: Daily contact with patient to assess and evaluate symptoms and progress in treatment Medication management  Plan: Doxycycline 100 mg twice daily for boil to inner Rt. thigh. Armandina Stammer I 08/12/2011, 1:28 PM

## 2011-08-12 NOTE — Progress Notes (Signed)
Pt attended discharge planning group and actively participated.  Pt interrupted group multiple times and passed a note to counselor during group.  Pt states he is not doing well today because he is agitated.  Pt states he was homicidal towards his brother yesterday because he feels his brother isn't caring for his mother properly.  Pt states that he would like to move to [place] now and would like either "theraputic housing" or substance abuse treatment, as pt reports having marijuana in his system.  Pt states the reason he wants housing is because his check won't be in until another 2 weeks.  Pt denies SI/HI today but went into detail why he is not ready to d/c any time soon.  Pt states he is not back to his normal and is still depressed.  Pt ranks depression at a 6-7 and anxiety at a 7-8. Pt wants to explore treatment options or housing.    Reyes Ivan, LCSWA 08/12/2011  12:05 PM

## 2011-08-12 NOTE — Progress Notes (Addendum)
Arthur Singleton spoke to counselor prior to aftercare planning group, expressing that he heard from nurse he might be discharged today. He indicated that he is not ready to be discharged, as he has stayed in bed for the past two days due to depression. (It should be noted that in those two days Arthur Singleton only missed 1 of 4 groups this counselor has facilitated, though he states he has stayed in bed.) Arthur Singleton reported that he talked to his mother last night after she was released from the hospital and his perception is that she should not have been released. He stated that he was so angry he was about to punch the wall. Went on to say that his mood is improving but he is "not there yet" regarding being ready to go home. Later, in aftercare group, Arthur Singleton stated that his anger is toward his brother who he believes is not being responsible for parents' care the way he should be. He reported homicidal thoughts toward brother "to the point that I wanted to sign my 72 hour and go after him", though those thoughts have subsided this morning. During group [facility] sent a note to counselor that he wants to go to a "14 day therapeutic placement" until he gets his check on February 1st, and that he wants counselor to set that up because he is more comfortable with her than with case manager. Following group counselor explained that referral is a case management task and that counselor cannot/will not do that. Arthur Singleton stated he would talk to [facility] about doing it, as Leeroy Bock is covering for his regular case Production designer, theatre/television/film. Before walking away he stated that although he is not suicidal he "would rather die than go out and have to deal with [his] family issues."   [name] 08/12/2011  8:47 AM

## 2011-08-12 NOTE — Progress Notes (Signed)
Pt has been attending groups and interacts well with staff and peers. Pt has a blunted affect and seems to be very needy today. Pt stated that he is not ready to D/C but feels he will be ready by Friday. Pt was offered support and encouragement. Pt is receptive to treatment and safety maintained on the unit.

## 2011-08-12 NOTE — Progress Notes (Signed)
Patient ID: Arthur Singleton, male   DOB: 03-23-1986, 26 y.o.   MRN: [id number] Spoke to patient this evening, verbalized no complaints or concerns until right before 2300. Patient states that he spoke to his mom and brother on the phone and got into an argument with brother that caused patient to be agitated and irritated. Patient was talking to another patient 1:1 about situation, patient would not entirely open up to this nurse. Patient was requesting something else for anxiety even though patient was just medicated with a scheduled dose of klonopin. Patient has PRN ativan that was discontinued. Tried talking with patient and instructed that this is an opportunity that he can work on his positive coping skills. Patient then stated that he would try to lay down and relax that if he was unable to sleep that he would notify nurse. Patient has been asleep and resting this evening.

## 2011-08-12 NOTE — Progress Notes (Signed)
[facility] Group Notes: (Counselor/Nursing/MHT/Case Management/Adjunct) 08/12/2011   @  11:00am   Type of Therapy:  Group Therapy  Participation Level:  Minimal  Participation Quality:  Distracting, Intrusive  Affect:  Blunted  Cognitive:  Appropriate  Insight:  Minimal  Engagement in Group: Minimal  Engagement in Therapy:  None  Modes of Intervention:  Support and Exploration  Summary of Progress/Problems: Arthur Singleton was very distracting during group, getting up and going into the hall several times, then returning. He also changed seats several times, and made several large sighs. Arthur Singleton was very focused on other group members and trying to give them advice. He seemed very frustrated when others disagreed with what he was saying. Arthur Singleton noticed near the end of group that another patient seemed anxious and went over to her. He whispered in her ear and tried to coach her in calming herself down, while the rest of the patients were still trying to have group. Arthur Singleton talked to others about not trying to fix others' problems, which builds pressure, but did not seem to apply this to himself.   Arthur Singleton 08/12/2011 12:36 PM

## 2011-08-12 NOTE — Progress Notes (Signed)
[facility] Group Notes: (Counselor/Nursing/MHT/Case Management/Adjunct) 08/12/2011   @1 :15pm  Type of Therapy:  Group Therapy  Participation Level:  Active  Participation Quality:  Monopolizing, Distracting, Supportive  Affect:  blunted  Cognitive:  Appropriate  Insight:  Limited  Engagement in Group: Limited  Engagement in Therapy:  Good  Modes of Intervention:  Support and Exploration  Summary of Progress/Problems: Arthur Singleton was quiet through the beginning of group. He did talk briefly about how he has homicidal thoughts when he is angry, but would not say this outright when counselor asked him, then came back and threw it in later. Arthur Singleton left group and came back at the end, when one group member was sharing a past trauma and another was having a moment of clarity about his lack of control over others. At this time, Arthur Singleton was very supportive to them opening up, stating that this is the first step toward healing. He then tried to guide the remainder of the group and tell the counselor what should happen next.   Arthur Singleton 08/12/2011  3:09 PM    Arthur Singleton, Arthur Singleton 08/12/2011 2:58 PM

## 2011-08-13 MED ORDER — LEVETIRACETAM 250 MG PO TABS: 250.0000 mg | ORAL_TABLET | ORAL | Status: DC

## 2011-08-13 MED ORDER — PHENYTOIN SODIUM EXTENDED 100 MG PO CAPS: 100.0000 mg | ORAL_CAPSULE | Freq: Three times a day (TID) | ORAL | Status: DC

## 2011-08-13 MED ORDER — HYDROCHLOROTHIAZIDE 25 MG PO TABS: 25.0000 mg | ORAL_TABLET | Freq: Every day | ORAL | Status: DC

## 2011-08-13 MED ORDER — MIRTAZAPINE 15 MG PO TABS: 15.0000 mg | ORAL_TABLET | Freq: Every day | ORAL | Status: DC

## 2011-08-13 MED ORDER — LOVASTATIN ER 20 MG PO TB24: 20.0000 mg | ORAL_TABLET | Freq: Every day | ORAL | Status: DC

## 2011-08-13 MED ORDER — FLUOXETINE HCL 20 MG PO CAPS: 60.0000 mg | ORAL_CAPSULE | Freq: Every day | ORAL | Status: DC

## 2011-08-13 MED ORDER — LAMOTRIGINE 150 MG PO TABS: 150.0000 mg | ORAL_TABLET | ORAL | Status: DC

## 2011-08-13 MED ORDER — CLONAZEPAM 1 MG PO TABS: 1.0000 mg | ORAL_TABLET | Freq: Three times a day (TID) | ORAL | Status: DC | PRN

## 2011-08-13 MED ORDER — MINOCYCLINE HCL 100 MG PO CAPS: 100.0000 mg | ORAL_CAPSULE | Freq: Two times a day (BID) | ORAL | Status: DC

## 2011-08-13 NOTE — Progress Notes (Signed)
[facility] Group Notes:  (Counselor/Nursing/MHT/Case Management/Adjunct)  08/13/2011 2:48 PM  Type of Therapy:  Group Therapy  Participation Level:  Limited  Participation Quality:  Appropriate and Attentive  Affect:  Appropriate  Cognitive:  Alert and Appropriate  Insight:  Limited  Engagement in Group:  Limited  Engagement in Therapy:  Limited  Modes of Intervention:  Education and Exploration  Summary of Progress/Problems: Patient attended group, but was called out by nurse to process discharge. Patient then returned to group towards the end of group exercise to give a few words to support and encourage his peers. Patient advised peers not to let there illness defeat them, but to gain control of their illness.   Arthur Singleton 08/13/2011, 2:48 PM  Cosigned by: Angus Palms, LCSW

## 2011-08-13 NOTE — Progress Notes (Signed)
Patient ID: Arthur Singleton, male   DOB: 24-Jan-1986, 26 y.o.   MRN: [id number] Pt. Reports depression at 7 out of 10 and anxiety at 10. Pt. Reports that with family situation its something he has to deal with. Pt. Reports that had pneumonia and dad also was sick. Pt. 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 Pt. Advised to use coping skills to help with depression and anxiety. Staff will continue to monitor q66min for safety.

## 2011-08-13 NOTE — Progress Notes (Signed)
[facility] Group Notes:  (Counselor/Nursing/MHT/Case Management/Adjunct)  08/13/2011 2:24 PM  Type of Therapy:  Group Therapy  Participation Level:  Active  Participation Quality:  Appropriate and Attentive  Affect:  Appropriate  Cognitive:  Alert and Appropriate  Insight:  Good  Engagement in Group:  Good  Engagement in Therapy:  Good  Modes of Intervention:  Activity, Clarification, Education and Exploration  Summary of Progress/Problems: Patient reported that a balanced life to him would be a life of sanity, serenity, and consideration. Patient stated we will never be free of our stress. He also stated that it is important to take of our self but using coping strategies, breathing exercises, and complying with medications.   Arthur Singleton 08/13/2011, 2:24 PM  Cosigned by: Angus Palms, LCSW

## 2011-08-13 NOTE — Progress Notes (Signed)
Pt was D/C. Pt went home with a bus pass. Pt denied SI/HI. Pt rx and follow-up visit were discussed. Pt belongings were returned.

## 2011-08-13 NOTE — [facility] Suicide Risk Assessment (Signed)
Suicide Risk Assessment  Discharge Assessment     Demographic factors:  Assessment Details Information Obtained From: Patient Current Mental Status:  Current Mental Status: Self-harm thoughts Risk Reduction Factors:  Risk Reduction Factors: Positive social support  CLINICAL FACTORS:   Severe Anxiety and/or Agitation Bipolar Disorder:   Bipolar II Depression:   Anhedonia Comorbid alcohol abuse/dependence Hopelessness Alcohol/Substance Abuse/Dependencies Personality Disorders:   Cluster B Epilepsy Chronic Pain Previous Psychiatric Diagnoses and Treatments Medical Diagnoses and Treatments/Surgeries  COGNITIVE FEATURES THAT CONTRIBUTE TO RISK:  Thought constriction (tunnel vision)    SUICIDE RISK:   Minimal: No identifiable suicidal ideation.  Patients presenting with no risk factors but with morbid ruminations; may be classified as minimal risk based on the severity of the depressive symptoms  Suicidal Ideation:   Plan:  No  Intent:  No  Means:  No  Homicidal Ideation:   Plan:  No  Intent:  No  Means:  No  Mental Status: General Appearance /Behavior:  Casual Eye Contact:  Good Motor Behavior:  Normal Speech:  Normal Level of Consciousness:  Alert Mood:  3 on a scale of 1 is the least and 10 is the most Affect:  Appropriate Anxiety Level:  3 on a scale of 1 is the least and 10 is the most Thought Process:  Coherent Thought Content:  WNL Perception:  Normal Judgment:  Fair Insight:  Present Cognition:  Orientation time, place and person Concentration Yes Sleep:  Number of Hours: 5.5   Vital Signs:Blood pressure 123/80, pulse 84, temperature 97.6 F (36.4 C), temperature source Other (Comment), resp. rate 18, height 5\' 8"  (1.727 m), weight 69.854 kg (154 lb). Lab Results:No results found for this or any previous visit (from the past 48 hour(s)).   Reports what they have learned from this hospital stay coping skills of how to not delay responses to  situations, how to soothe himself, how to process more efficiently.  He plans to use these skills and his support network to help him stay out of the hospital.  Risk of harm to self is elevated by his past history of 3 prior suicide attempts and history of struggling with borderline personality and poor mood modulation.  Risk of harm to others is low in that he has never harbored any plans to harm anyone else.  Continue . clonazePAM  1 mg Oral [id number]  . FLUoxetine  60 mg Oral Daily  . lamoTRIgine  150 mg Oral BH-qamhs  . levETIRAcetam  500 mg Oral QHS  . levETIRAcetam  250 mg Oral Q0700  . minocycline  100 mg Oral BID  . mirtazapine  15 mg Oral QHS  . nicotine  21 mg Transdermal Daily  . phenytoin  100 mg Oral [id number]  Because Klonopin helps with anxiety, but causes cloudy thinking, forgetfulness, and disinhibition, should consider change to non addictive non cloudy thinking agents for anxiety, Prozac helps with depression, Lamictal helps with mood and control of seizures, Keppra, Dilantin, and Phentoin help with control of seizures, Minicyclin helps with infected cyst on (R) leg, Remeron helps with insomnia.  Arthur Singleton 08/13/2011, 1:56 PM

## 2011-08-13 NOTE — Discharge Summary (Signed)
Patient ID: Arthur Singleton MRN: [id number] DOB/AGE: [birth year] 27 y.o.  Admit date: 08/05/2011 Discharge date: 08/13/2011  Reason for Admission: Major depression, recurrent, Suicidal ideations.   Hospital Course: This is a 26 year old Caucasian male. Admitted from [facility] ED to [facility] with complaints of major depression. Patient reports, "I am here because of suicidal thoughts. I have been depressed for sometime, but more depressed x 2 months. I recently experienced the death of my significant other and 4 family members since August 2012. I got overwhelmed with grief. I felt hopeless and empty. I was at the [facility] psychiatric hospital in [place] about 1 year ago for depression. While admitted to this hospital, patient received medication management for his depression/anxiety as well as his other medical conditions. Patient from day to day expressed how negative and stressful life has been for him due to his bad childhood experience of feeling not regarded by his family. 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 He was recently started on antibiotics for a boil to inner right thigh and is tolerating treatment well. Arthur Singleton participated in group counseling where he learned coping skills that he states will help him cope outside the hospital. He is to follow-up psychiatric care on an out-patient basis with [facility]. He is discharged with all belongings/valuables.    Discharge Diagnoses:  AXIS I Major depressive disorder, PTSD  AXIS II Borderline traits.  AXIS III Past Medical History  Diagnosis Date  . Seizures   . Traumatic brain injury     AXIS IV Problem with support sysem, Economic stressors.  AXIS V 61-70 mild symptoms   Condition on  Discharge: Suicidal Ideation:   Plan:  No  Intent:  No  Means:  No  Homicidal Ideation:   Plan:  No  Intent:  No  Means:  No  Mental Status: General  Appearance /Behavior:  Casual Eye Contact:  Good Motor Behavior:  Normal Speech:  Normal Level of Consciousness:  Alert Mood:  3 on a scale of 1 is the least and 10 is the most Affect:  Appropriate Anxiety Level:  3 on a scale of 1 is the least and 10 is the most Thought Process:  Coherent Thought Content:  WNL Perception:  Normal Judgment:  Fair Insight:  Present Cognition:  Orientation time, place and person Concentration Yes Sleep:  Number of Hours: 5.5   Vital Signs:Blood pressure 123/80, pulse 84, temperature 97.6 F (36.4 C), temperature source Other (Comment), resp. rate 18, height 5\' 8"  (1.727 m), weight 69.854 kg (154 lb). Lab Results:No results found for this or any previous visit (from the past 48 hour(s)).   Reports what they have learned from this hospital stay coping skills of how to not delay responses to situations, how to soothe himself, how to process more efficiently.  He plans to use these skills and his support network to help him stay out of the hospital.  Risk of harm to self is elevated by his past history of 3 prior suicide attempts and history of struggling with borderline personality and poor mood modulation.  Risk of harm to others is low in that he has never harbored any plans to harm anyone else.  Plan:  Medication List  As of 08/13/2011  2:08 PM   STOP taking these medications         promethazine 25 MG tablet      ziprasidone 40 MG capsule  TAKE these medications         clonazePAM 1 MG tablet   Commonly known as: KLONOPIN   Take 1 tablet (1 mg total) by mouth 3 (three) times daily as needed. For anxiety      FLUoxetine 20 MG capsule   Commonly known as: PROZAC   Take 3 capsules (60 mg total) by mouth daily. For Depression      hydrochlorothiazide 25 MG tablet   Commonly known as: HYDRODIURIL   Take 1 tablet (25 mg total) by mouth daily. For control of high blood pressure      lamoTRIgine 150 MG tablet   Commonly known as:  LAMICTAL   Take 1 tablet (150 mg total) by mouth 2 (two) times daily in the am and at bedtime.. For control of mood and seizures.      levETIRAcetam 250 MG tablet   Commonly known as: KEPPRA   Take 1-2 tablets (250-500 mg total) by mouth as directed. For control of seizures. 250 every morning and 500 every night      lovastatin 20 MG 24 hr tablet   Commonly known as: ALTOPREV   Take 1 tablet (20 mg total) by mouth daily before breakfast. To help lower cholesterol      minocycline 100 MG capsule   Commonly known as: MINOCIN,DYNACIN   Take 1 capsule (100 mg total) by mouth 2 (two) times daily. For cyst on (R) leg      mirtazapine 15 MG tablet   Commonly known as: REMERON   Take 1 tablet (15 mg total) by mouth at bedtime.      phenytoin 100 MG ER capsule   Commonly known as: DILANTIN   Take 1 capsule (100 mg total) by mouth 3 (three) times daily. For control of seizures with Dilantin and Keppra and Lamictal           Follow-up Information    Follow up with [facility].   Contact information:   [address] [place], [place]  [address]        SignedDan Humphreys, EDWIN 08/13/2011, 2:08 PM

## 2011-08-13 NOTE — Discharge Summary (Signed)
Discharge Note  Patient:  Horatio Bertz is an 26 y.o., male DOB:  01-Oct-1985  Date of Admission:  08/05/2011  Date of Discharge:  08/13/2011  Level of Care:  OP  Discharge destination:  HOME  Is patient on multiple antipsychotic therapies at discharge:  NO  Patient phone:  [phone] (home) Patient address:   [address] [place] [address]  The patient received suicide prevention pamphlet:  YES Belongings returned:  Valuables  Dan Humphreys, Jerilyn Gillaspie 08/13/2011,2:07 PM

## 2011-08-13 NOTE — Progress Notes (Signed)
[facility] Case Management Discharge Plan:  Will you be returning to the same living situation after discharge: Yes,  Arthur Singleton is returning to [employer] At discharge, do you have transportation home?:  No - assist with bus pass Do you have the ability to pay for your medications:Yes,  Arthur Singleton has Medicare and Medicaid  Interagency Information:     Release of information consent forms completed and in the chart;  Patient's signature needed at discharge.  Patient to Follow up at:  Follow-up Information    Follow up with Dr. Abigail Butts on 08/25/2011. (Tuesday, August 25, 2011 at 1:45 p.m.  Please  arrive by 12:45 to complete registation forms)    Contact information:   [address]  [place], [place]  [address]  [phone]         Patient denies SI/HI:   Yes,  Arthur Singleton is not endorsing SI at this time    Safety Planning and Suicide Prevention discussed:  Yes,  SI reviewed during group  Barrier to discharge identified:Yes,  Lack of permanent housing and support system  Summary and Recommendations:  Arthur Singleton will follow up with [facility].  He will be called with the appointment for a therapist as he as asked for a male therapist that will work with both Medicare and Medicaid.   Wynn Banker 08/13/2011, 2:56 PM

## 2011-08-13 NOTE — Progress Notes (Signed)
[facility] Adult Inpatient Family/Significant Other Suicide Prevention Education  Suicide Prevention Education:  Patient Refusal for Family/Significant Other Suicide Prevention Education: The patient Arthur Singleton has refused to provide written consent for family/significant other to be provided Family/Significant Other Suicide Prevention Education during admission and/or prior to discharge.  Physician notified.  [name] reported that there is no one to be contacted, and that the only person who is truly supportive to him knows all about depression and suicide. He does not want her contacted. Counselor provided [name] with suicide prevention pamphlet and educated him on risks, warning signs, and crisis resources for suicide. He verbalized understanding of suicide prevention and had no further questions  [name] 08/13/2011, 2:01 PM

## 2011-08-14 NOTE — Progress Notes (Signed)
Patient Discharge Instructions:  Admission Note Faxed,  08/14/2011 Discharge Note Faxed,   08/14/2011 After Visit Summary Faxed,  08/14/2011 Faxed to the [facility] Care provider:  08/14/2011 D/C Summary faxed 08/14/2011 Facesheet faxed 08/14/2011   Faxed to [facility] - Dr. Dicky Doe @ [phone]   Wandra Scot, 08/14/2011, 1:04 PM

## 2011-08-25 ENCOUNTER — Emergency Department (HOSPITAL_COMMUNITY)
Admission: EM | Admit: 2011-08-25 | Discharge: 2011-08-26 | Disposition: A | Discharge status: Home / Self Care | Payer: Medicare Other | Attending: Emergency Medicine | Admitting: Emergency Medicine

## 2011-08-25 ENCOUNTER — Encounter (HOSPITAL_COMMUNITY): Payer: Self-pay

## 2011-08-25 DIAGNOSIS — I10 Essential (primary) hypertension: Secondary | ICD-10-CM | POA: Insufficient documentation

## 2011-08-25 DIAGNOSIS — E785 Hyperlipidemia, unspecified: Secondary | ICD-10-CM | POA: Insufficient documentation

## 2011-08-25 DIAGNOSIS — F319 Bipolar disorder, unspecified: Secondary | ICD-10-CM | POA: Insufficient documentation

## 2011-08-25 DIAGNOSIS — R51 Headache: Secondary | ICD-10-CM | POA: Insufficient documentation

## 2011-08-25 DIAGNOSIS — R569 Unspecified convulsions: Secondary | ICD-10-CM | POA: Insufficient documentation

## 2011-08-25 DIAGNOSIS — Z87820 Personal history of traumatic brain injury: Secondary | ICD-10-CM | POA: Insufficient documentation

## 2011-08-25 DIAGNOSIS — G4489 Other headache syndrome: Secondary | ICD-10-CM

## 2011-08-25 HISTORY — DX: Essential (primary) hypertension: I10

## 2011-08-25 HISTORY — DX: Bipolar disorder, unspecified: F31.9

## 2011-08-25 HISTORY — DX: Hyperlipidemia, unspecified: E78.5

## 2011-08-25 MED ORDER — DIPHENHYDRAMINE HCL 50 MG/ML IJ SOLN
25.0000 mg | Freq: Once | INTRAMUSCULAR | Status: AC
  Administered 2011-08-25: 25 mg via INTRAVENOUS
  Filled 2011-08-25: qty 1

## 2011-08-25 MED ORDER — KETOROLAC TROMETHAMINE 30 MG/ML IJ SOLN
30.0000 mg | Freq: Once | INTRAMUSCULAR | Status: AC
  Administered 2011-08-25: 30 mg via INTRAVENOUS
  Filled 2011-08-25: qty 1

## 2011-08-25 MED ORDER — SODIUM CHLORIDE 0.9 % IV BOLUS (SEPSIS)
1000.0000 mL | Freq: Once | INTRAVENOUS | Status: AC
  Administered 2011-08-25: 1000 mL via INTRAVENOUS

## 2011-08-25 MED ORDER — ONDANSETRON HCL 4 MG/2ML IJ SOLN
4.0000 mg | Freq: Once | INTRAMUSCULAR | Status: AC
  Administered 2011-08-25: 4 mg via INTRAVENOUS
  Filled 2011-08-25: qty 2

## 2011-08-25 MED ORDER — HYDROMORPHONE HCL PF 1 MG/ML IJ SOLN
1.0000 mg | Freq: Once | INTRAMUSCULAR | Status: AC
  Administered 2011-08-25: 1 mg via INTRAVENOUS
  Filled 2011-08-25: qty 1

## 2011-08-25 NOTE — ED Provider Notes (Signed)
History     CSN: [id number]  Arrival date & time 08/25/11  2010   First MD Initiated Contact with Patient 08/25/11 2018      Chief Complaint  Patient presents with  . Seizures    pt. had witnessed sz this pm.    (Consider location/radiation/quality/duration/timing/severity/associated sxs/prior treatment) Patient is a 26 y.o. male presenting with seizures. The history is provided by the patient.  Seizures  This is a chronic problem. The current episode started less than 1 hour ago. The problem has been resolved. There was 1 seizure. The most recent episode lasted less than 30 seconds. Associated symptoms include headaches. Pertinent negatives include no sleepiness, no confusion, no speech difficulty, no visual disturbance, no neck stiffness, no sore throat, no chest pain, no cough, no nausea, no vomiting, no diarrhea and no muscle weakness. Characteristics include rhythmic jerking and loss of consciousness. Characteristics do not include bowel incontinence, bladder incontinence or bit tongue. The episode was witnessed. There was the sensation of an aura present. The seizures did not continue in the ED. The seizure(s) had no focality. Possible causes do not include med or dosage change, sleep deprivation, missed seizure meds, recent illness or change in alcohol use. There has been no fever. There were no medications administered prior to arrival.    Past Medical History  Diagnosis Date  . Seizures   . Traumatic brain injury   . Hypertension   . Hyperlipemia   . Bipolar 1 disorder     Past Surgical History  Procedure Date  . Brain surgery     History reviewed. No pertinent family history.  History  Substance Use Topics  . Smoking status: Current Everyday Smoker -- 1.5 packs/day    Types: Cigarettes  . Smokeless tobacco: Not on file  . Alcohol Use: No      Review of Systems  Constitutional: Negative for fever, chills, activity change, appetite change and fatigue.  HENT:  Negative for congestion, sore throat, rhinorrhea, neck pain and neck stiffness.   Eyes: Negative for photophobia, redness and visual disturbance.  Respiratory: Negative for cough, shortness of breath and wheezing.   Cardiovascular: Negative for chest pain, palpitations and leg swelling.  Gastrointestinal: Negative for nausea, vomiting, abdominal pain, diarrhea, constipation, blood in stool and bowel incontinence.  Genitourinary: Negative for bladder incontinence, dysuria, urgency, hematuria and flank pain.  Musculoskeletal: Negative for back pain.  Skin: Negative for rash and wound.  Neurological: Positive for seizures (once), loss of consciousness and headaches. Negative for dizziness, facial asymmetry, speech difficulty, weakness, light-headedness and numbness.  Psychiatric/Behavioral: Negative for confusion.  All other systems reviewed and are negative.    Allergies  Iodine  Home Medications   Current Outpatient Rx  Name Route Sig Dispense Refill  . CLONAZEPAM 1 MG PO TABS Oral Take 1 tablet (1 mg total) by mouth 3 (three) times daily as needed. For anxiety 90 tablet 0  . FLUOXETINE HCL 20 MG PO CAPS Oral Take 3 capsules (60 mg total) by mouth daily. For Depression 90 capsule 0  . HYDROCHLOROTHIAZIDE 25 MG PO TABS Oral Take 1 tablet (25 mg total) by mouth daily. For control of high blood pressure 30 tablet 0  . LAMOTRIGINE 150 MG PO TABS Oral Take 1 tablet (150 mg total) by mouth 2 (two) times daily in the am and at bedtime.. For control of mood and seizures. 60 tablet 0  . LEVETIRACETAM 250 MG PO TABS Oral Take 1-2 tablets (250-500 mg total) by mouth as  directed. For control of seizures. 250 every morning and 500 every night 90 tablet 0  . LOVASTATIN ER 20 MG PO TB24 Oral Take 1 tablet (20 mg total) by mouth daily before breakfast. To help lower cholesterol 30 tablet 0  . MINOCYCLINE HCL 100 MG PO CAPS Oral Take 1 capsule (100 mg total) by mouth 2 (two) times daily. For cyst on (R) leg  12 capsule 0  . MIRTAZAPINE 15 MG PO TABS Oral Take 1 tablet (15 mg total) by mouth at bedtime. 30 tablet 0  . PHENYTOIN SODIUM EXTENDED 100 MG PO CAPS Oral Take 1 capsule (100 mg total) by mouth 3 (three) times daily. For control of seizures with Dilantin and Keppra and Lamictal 90 capsule 0    BP 136/83  Pulse 106  Temp(Src) 99 F (37.2 C) (Oral)  Resp 15  SpO2 96%  Physical Exam  Nursing note and vitals reviewed. Constitutional: He is oriented to person, place, and time. He appears well-developed and well-nourished.  Non-toxic appearance. No distress.  HENT:  Head: Normocephalic and atraumatic.  Mouth/Throat: Oropharynx is clear and moist. Mucous membranes are dry.  Eyes: Conjunctivae and EOM are normal. Pupils are equal, round, and reactive to light. No scleral icterus.  Neck: Normal range of motion. Neck supple. No JVD present.  Cardiovascular: Regular rhythm, normal heart sounds and intact distal pulses.  Tachycardia present.   No murmur heard.      HR 105 and regular.   Pulmonary/Chest: Effort normal and breath sounds normal. No respiratory distress. He has no wheezes. He has no rales.  Abdominal: Soft. Bowel sounds are normal. He exhibits no distension. There is no tenderness. There is no rebound and no guarding.  Musculoskeletal: Normal range of motion.  Neurological: He is alert and oriented to person, place, and time. He has normal strength. No cranial nerve deficit or sensory deficit. GCS eye subscore is 4. GCS verbal subscore is 5. GCS motor subscore is 6.  Skin: Skin is warm and dry. No rash noted. He is not diaphoretic.  Psychiatric: He has a normal mood and affect.    ED Course  Procedures (including critical care time)  Labs Reviewed  GLUCOSE, CAPILLARY - Abnormal; Notable for the following:    Glucose-Capillary 153 (*)    All other components within normal limits  PHENYTOIN LEVEL, TOTAL   No results found.   1. Seizure   2. Post-seizure headache        MDM  25yom with PMH significant for bipolar d/o, migraine HAs, traumatic brain injury and epilepsy who presents to the ED via EMS due to seizure. Witnessed generalized tonic clonic seizure at [employer]. Preceded by aura of flashing lights and metallic taste in his mouth. No recent illness. No missed meds. Last seizure about 1 week ago. Pt with mild stuttering speech which he says he always has following a seizure, but otherwise neurologically intact and returning to baseline. Will check dilantin level. Pt with dry mucous membranes will give IVF and monitor.   Pt having HA after seizure. Usually gets benadryl toradol and dilaudid for HA following seizure. Will tx his HA.   Dilantin level therapeutic.   Pt reassessed and able to get up and ambulate to bathroom. Still having residual HA as he does after seizures nothing atypical for him. Giving 2nd dose of pain medication. He has called for a ride to take him home.   Pt feeling better will d/c.       Jill Alexanders  Silver Huguenin, MD 08/25/11 2345

## 2011-08-25 NOTE — ED Provider Notes (Signed)
I saw and evaluated the patient, reviewed the resident's note and I agree with the findings and plan. Patient with a breakthrough seizure. Therapeutic on his Dilantin. No apparent trauma from the seizure. His mental status improved and he was discharged  Arthur Singleton. Rubin Payor, MD 08/25/11 [phone]

## 2011-08-25 NOTE — ED Notes (Signed)
Pt. Arrived EMS, c/o earlier witnessed sz at [employer], pt. Stuttering, no noted post ictal state.  Answers questions appropriately.  Last sz was last week.  Hx of brain surgery.

## 2011-08-26 ENCOUNTER — Emergency Department (HOSPITAL_COMMUNITY)
Admission: EM | Admit: 2011-08-26 | Discharge: 2011-08-26 | Disposition: A | Discharge status: Home / Self Care | Payer: Medicare Other | Attending: Emergency Medicine | Admitting: Emergency Medicine

## 2011-08-26 ENCOUNTER — Emergency Department (HOSPITAL_COMMUNITY)
Admission: EM | Admit: 2011-08-26 | Discharge: 2011-08-26 | Discharge status: Against Medical Advice | Payer: Medicare Other

## 2011-08-26 ENCOUNTER — Encounter (HOSPITAL_COMMUNITY): Payer: Self-pay | Admitting: Emergency Medicine

## 2011-08-26 DIAGNOSIS — F17200 Nicotine dependence, unspecified, uncomplicated: Secondary | ICD-10-CM | POA: Insufficient documentation

## 2011-08-26 DIAGNOSIS — E785 Hyperlipidemia, unspecified: Secondary | ICD-10-CM | POA: Insufficient documentation

## 2011-08-26 DIAGNOSIS — G43909 Migraine, unspecified, not intractable, without status migrainosus: Secondary | ICD-10-CM | POA: Insufficient documentation

## 2011-08-26 DIAGNOSIS — Z87820 Personal history of traumatic brain injury: Secondary | ICD-10-CM | POA: Insufficient documentation

## 2011-08-26 DIAGNOSIS — G40909 Epilepsy, unspecified, not intractable, without status epilepticus: Secondary | ICD-10-CM | POA: Insufficient documentation

## 2011-08-26 DIAGNOSIS — G4489 Other headache syndrome: Secondary | ICD-10-CM

## 2011-08-26 DIAGNOSIS — F319 Bipolar disorder, unspecified: Secondary | ICD-10-CM | POA: Insufficient documentation

## 2011-08-26 DIAGNOSIS — R110 Nausea: Secondary | ICD-10-CM | POA: Insufficient documentation

## 2011-08-26 DIAGNOSIS — I10 Essential (primary) hypertension: Secondary | ICD-10-CM | POA: Insufficient documentation

## 2011-08-26 DIAGNOSIS — R569 Unspecified convulsions: Secondary | ICD-10-CM

## 2011-08-26 MED ORDER — SODIUM CHLORIDE 0.9 % IV BOLUS (SEPSIS)
1000.0000 mL | Freq: Once | INTRAVENOUS | Status: AC
  Administered 2011-08-26: 1000 mL via INTRAVENOUS

## 2011-08-26 MED ORDER — KETOROLAC TROMETHAMINE 30 MG/ML IJ SOLN
30.0000 mg | Freq: Once | INTRAMUSCULAR | Status: AC
  Administered 2011-08-26: 30 mg via INTRAVENOUS
  Filled 2011-08-26: qty 1

## 2011-08-26 MED ORDER — HYDROMORPHONE HCL PF 2 MG/ML IJ SOLN
2.0000 mg | Freq: Once | INTRAMUSCULAR | Status: AC
  Administered 2011-08-26: 2 mg via INTRAMUSCULAR
  Filled 2011-08-26: qty 1

## 2011-08-26 MED ORDER — DIPHENHYDRAMINE HCL 50 MG/ML IJ SOLN
25.0000 mg | Freq: Once | INTRAMUSCULAR | Status: AC
  Administered 2011-08-26: 25 mg via INTRAVENOUS
  Filled 2011-08-26: qty 1

## 2011-08-26 MED ORDER — ONDANSETRON HCL 4 MG/2ML IJ SOLN
4.0000 mg | Freq: Once | INTRAMUSCULAR | Status: AC
  Administered 2011-08-26: 4 mg via INTRAVENOUS
  Filled 2011-08-26: qty 2

## 2011-08-26 MED ORDER — HYDROMORPHONE HCL PF 1 MG/ML IJ SOLN
1.0000 mg | Freq: Once | INTRAMUSCULAR | Status: DC
  Filled 2011-08-26: qty 1

## 2011-08-26 MED ORDER — HYDROMORPHONE HCL PF 1 MG/ML IJ SOLN
1.0000 mg | Freq: Once | INTRAMUSCULAR | Status: AC
  Administered 2011-08-26: 1 mg via INTRAVENOUS
  Filled 2011-08-26: qty 1

## 2011-08-26 NOTE — ED Provider Notes (Signed)
History     CSN: [id number]  Arrival date & time 08/26/11  4098   First MD Initiated Contact with Patient 08/26/11 [phone]      Chief Complaint  Patient presents with  . Seizures  . Migraine    (Consider location/radiation/quality/duration/timing/severity/associated sxs/prior treatment) Patient is a 26 y.o. male presenting with seizures and migraine. The history is provided by the patient.  Seizures  This is a chronic problem. The current episode started 3 to 5 hours ago. The problem has been resolved. There were 2 to 3 seizures. The most recent episode lasted 30 to 120 seconds. Associated symptoms include headaches and nausea. Pertinent negatives include no confusion, no speech difficulty, no visual disturbance, no neck stiffness, no sore throat, no chest pain, no cough, no vomiting, no diarrhea and no muscle weakness. Characteristics include rhythmic jerking and loss of consciousness. Characteristics do not include bowel incontinence, bladder incontinence or bit tongue. There was the sensation of an aura present. The seizures did not continue in the ED. The seizure(s) had no focality. Possible causes do not include med or dosage change, missed seizure meds, recent illness or change in alcohol use. There has been no fever. There were no medications administered prior to arrival.  Migraine This is a chronic problem. The current episode started today (after seizures). The problem occurs constantly. The problem has been unchanged. Associated symptoms include headaches and nausea. Pertinent negatives include no abdominal pain, chest pain, chills, coughing, diaphoresis, fever, numbness, rash, sore throat, visual change, vomiting or weakness. Exacerbated by: light. He has tried nothing for the symptoms.  Pt seen and eval for same thing at [facility] last night. Reports symptom improvement prior to d/c but more seizures when he went home followed by typical migraine HA. He has a neurologist on [place], [place]  and is going back home to [place] on 08/28/2011 (has been visiting here).   Past Medical History  Diagnosis Date  . Seizures   . Traumatic brain injury   . Hypertension   . Hyperlipemia   . Bipolar 1 disorder   . Migraine     Past Surgical History  Procedure Date  . Brain surgery   . Right arm growth plate     Family History  Problem Relation Age of Onset  . COPD Mother   . Coronary artery disease Mother   . Stroke Mother   . Diabetes Mother   . Diabetes Father     History  Substance Use Topics  . Smoking status: Current Everyday Smoker -- 1.5 packs/day    Types: Cigarettes  . Smokeless tobacco: Not on file  . Alcohol Use: No      Review of Systems  Constitutional: Negative for fever, chills and diaphoresis.  HENT: Negative for sore throat.        Negative tongue pain or known tongue injury  Eyes: Negative for visual disturbance.  Respiratory: Negative for cough, chest tightness and shortness of breath.   Cardiovascular: Negative for chest pain.  Gastrointestinal: Positive for nausea. Negative for vomiting, abdominal pain, diarrhea and bowel incontinence.  Genitourinary: Negative for bladder incontinence.  Skin: Negative for rash and wound.  Neurological: Positive for seizures, loss of consciousness and headaches. Negative for dizziness, speech difficulty, weakness and numbness.  Psychiatric/Behavioral: Negative for confusion.  All other systems reviewed and are negative.    Allergies  Iodine and Shellfish allergy  Home Medications   Current Outpatient Rx  Name Route Sig Dispense Refill  . CLONAZEPAM 1 MG  PO TABS Oral Take 1 tablet (1 mg total) by mouth 3 (three) times daily as needed. For anxiety 90 tablet 0  . FLUOXETINE HCL 20 MG PO CAPS Oral Take 3 capsules (60 mg total) by mouth daily. For Depression 90 capsule 0  . HYDROCHLOROTHIAZIDE 25 MG PO TABS Oral Take 1 tablet (25 mg total) by mouth daily. For control of high blood pressure 30 tablet 0  .  LAMOTRIGINE 150 MG PO TABS Oral Take 1 tablet (150 mg total) by mouth 2 (two) times daily in the am and at bedtime.. For control of mood and seizures. 60 tablet 0  . LEVETIRACETAM 250 MG PO TABS Oral Take 1,000 mg by mouth 2 (two) times daily. For control of seizures. 250 every morning and 500 every night    . LOVASTATIN ER 20 MG PO TB24 Oral Take 1 tablet (20 mg total) by mouth daily before breakfast. To help lower cholesterol 30 tablet 0  . MIRTAZAPINE 15 MG PO TABS Oral Take 1 tablet (15 mg total) by mouth at bedtime. 30 tablet 0  . PHENYTOIN SODIUM EXTENDED 100 MG PO CAPS Oral Take 100 mg by mouth at bedtime. For control of seizures with Dilantin and Keppra and Lamictal      BP 117/66  Pulse 82  Temp(Src) 98.9 F (37.2 C) (Oral)  Resp 20  SpO2 97%  Physical Exam  Nursing note and vitals reviewed. Constitutional: He is oriented to person, place, and time. He appears well-developed and well-nourished. No distress.       Non-toxic appearing  HENT:  Head: Normocephalic and atraumatic.  Right Ear: External ear normal.  Left Ear: External ear normal.  Nose: Nose normal.  Mouth/Throat: Mucous membranes are dry.  Eyes: Conjunctivae and EOM are normal. Pupils are equal, round, and reactive to light.  Neck: Normal range of motion. Neck supple.  Cardiovascular: Normal rate, regular rhythm, normal heart sounds and intact distal pulses.   Pulmonary/Chest: Effort normal and breath sounds normal. No respiratory distress. He has no wheezes. He exhibits no tenderness.  Abdominal: Soft. Bowel sounds are normal. He exhibits no distension. There is no tenderness. There is no rebound and no guarding.  Musculoskeletal: Normal range of motion. He exhibits no edema and no tenderness.  Neurological: He is alert and oriented to person, place, and time. He has normal strength. No cranial nerve deficit or sensory deficit. He displays a negative Romberg sign. He displays no seizure activity. Coordination and  gait normal. GCS eye subscore is 4. GCS verbal subscore is 5. GCS motor subscore is 6.  Skin: Skin is warm and dry. No rash noted. No pallor.  Psychiatric: He has a normal mood and affect.    ED Course  Procedures (including critical care time)  Labs Reviewed - No data to display No results found.   1. Seizure   2. Post-seizure headache       MDM  Pt seen and evaluated. Breakthrough seizures with typical post-seizure HA. Slight stutter when speaking, normal per pt. Neurologically intact, not post-ictal on assessment. Dilantin level checked less than 12 hours ago therapeutic, do not need to check again. Pt requesting medications for HA and IVF.   7:30 AM Pt with some improvement in pain. Requests additional medication. 1mg  dilaudid ordered, discussed with pt plan to d/c afterward. He will call a ride. Advised to f/u with neurologist Lonzo Candy).         [address] [place], [place] 08/29/11 [phone]

## 2011-08-26 NOTE — ED Notes (Signed)
Pt alert, nad, c/o "thrush in mouth", onset several days ago, pt states feelings of hopelessness, denies SI/HI, resp even unlabored, skin pwd

## 2011-08-26 NOTE — ED Notes (Signed)
Pt states his roommate had multiple small seizures tonight that lasted less than 5 minutes and pt is c/o migraine headache

## 2011-08-26 NOTE — ED Notes (Signed)
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 Encouraged to return if condition worsens.

## 2011-08-27 ENCOUNTER — Emergency Department (HOSPITAL_COMMUNITY)
Admission: EM | Admit: 2011-08-27 | Discharge: 2011-08-27 | Disposition: A | Discharge status: Home / Self Care | Payer: Medicare Other | Attending: Emergency Medicine | Admitting: Emergency Medicine

## 2011-08-27 ENCOUNTER — Encounter (HOSPITAL_COMMUNITY): Payer: Self-pay | Admitting: *Deleted

## 2011-08-27 DIAGNOSIS — I10 Essential (primary) hypertension: Secondary | ICD-10-CM | POA: Insufficient documentation

## 2011-08-27 DIAGNOSIS — F319 Bipolar disorder, unspecified: Secondary | ICD-10-CM | POA: Insufficient documentation

## 2011-08-27 DIAGNOSIS — G40909 Epilepsy, unspecified, not intractable, without status epilepticus: Secondary | ICD-10-CM | POA: Insufficient documentation

## 2011-08-27 DIAGNOSIS — Z87820 Personal history of traumatic brain injury: Secondary | ICD-10-CM | POA: Insufficient documentation

## 2011-08-27 DIAGNOSIS — Z79899 Other long term (current) drug therapy: Secondary | ICD-10-CM | POA: Insufficient documentation

## 2011-08-27 DIAGNOSIS — K047 Periapical abscess without sinus: Secondary | ICD-10-CM | POA: Insufficient documentation

## 2011-08-27 DIAGNOSIS — B370 Candidal stomatitis: Secondary | ICD-10-CM | POA: Insufficient documentation

## 2011-08-27 DIAGNOSIS — E785 Hyperlipidemia, unspecified: Secondary | ICD-10-CM | POA: Insufficient documentation

## 2011-08-27 MED ORDER — NYSTATIN 100000 UNIT/ML MT SUSP
5.0000 mL | OROMUCOSAL | Status: AC
  Administered 2011-08-27: 500000 [IU] via ORAL
  Filled 2011-08-27: qty 5

## 2011-08-27 MED ORDER — NYSTATIN 100000 UNIT/ML MT SUSP: 5.0000 mL | Freq: Four times a day (QID) | OROMUCOSAL | Status: AC

## 2011-08-27 NOTE — ED Provider Notes (Signed)
History     CSN: [id number]  Arrival date & time 08/27/11  7829   First MD Initiated Contact with Patient 08/27/11 [phone]      Chief Complaint  Patient presents with  . Thrush    (Consider location/radiation/quality/duration/timing/severity/associated sxs/prior treatment) HPI Comments: Patient state he noticed his tongue was coated in a white coating,yesterday.  Denies sore throat, fever, myalgias  The history is provided by the patient.    Past Medical History  Diagnosis Date  . Seizures   . Traumatic brain injury   . Hypertension   . Hyperlipemia   . Bipolar 1 disorder   . Migraine     Past Surgical History  Procedure Date  . Brain surgery   . Right arm growth plate     Family History  Problem Relation Age of Onset  . COPD Mother   . Coronary artery disease Mother   . Stroke Mother   . Diabetes Mother   . Diabetes Father     History  Substance Use Topics  . Smoking status: Current Everyday Smoker -- 1.5 packs/day    Types: Cigarettes  . Smokeless tobacco: Not on file  . Alcohol Use: No      Review of Systems  Constitutional: Negative for fever and chills.  HENT: Negative for sore throat, rhinorrhea, drooling, mouth sores, trouble swallowing and voice change.   Respiratory: Negative for cough and shortness of breath.   Gastrointestinal: Negative for nausea.  Neurological: Negative for dizziness and weakness.    Allergies  Iodine and Shellfish allergy  Home Medications   Current Outpatient Rx  Name Route Sig Dispense Refill  . CLONAZEPAM 1 MG PO TABS Oral Take 1 tablet (1 mg total) by mouth 3 (three) times daily as needed. For anxiety 90 tablet 0  . FLUOXETINE HCL 20 MG PO CAPS Oral Take 60 mg by mouth at bedtime. For Depression    . HYDROCHLOROTHIAZIDE 25 MG PO TABS Oral Take 1 tablet (25 mg total) by mouth daily. For control of high blood pressure 30 tablet 0  . LAMOTRIGINE 150 MG PO TABS Oral Take 1 tablet (150 mg total) by mouth 2 (two) times  daily in the am and at bedtime.. For control of mood and seizures. 60 tablet 0  . LEVETIRACETAM 250 MG PO TABS Oral Take 1,000 mg by mouth 2 (two) times daily. For control of seizures. 250 every morning and 500 every night    . LOVASTATIN ER 20 MG PO TB24 Oral Take 1 tablet (20 mg total) by mouth daily before breakfast. To help lower cholesterol 30 tablet 0  . MIRTAZAPINE 15 MG PO TABS Oral Take 1 tablet (15 mg total) by mouth at bedtime. 30 tablet 0  . PHENYTOIN SODIUM EXTENDED 100 MG PO CAPS Oral Take 100 mg by mouth at bedtime. For control of seizures with Dilantin and Keppra and Lamictal    . NYSTATIN 100000 UNIT/ML MT SUSP Oral Take 5 mLs (500,000 Units total) by mouth 4 (four) times daily. 60 mL 0    BP 144/95  Pulse 77  Temp(Src) 98 F (36.7 C) (Oral)  Resp 20  SpO2 96%  Physical Exam  Constitutional: He is oriented to person, place, and time. He appears well-developed and well-nourished.  HENT:  Head: Normocephalic.  Mouth/Throat: No oral lesions. Dental abscesses present. No uvula swelling or dental caries.    Eyes: Pupils are equal, round, and reactive to light.  Neck: Normal range of motion. No thyromegaly present.  Cardiovascular: Normal rate.   Pulmonary/Chest: Effort normal.  Musculoskeletal: Normal range of motion.  Neurological: He is alert and oriented to person, place, and time.  Skin: Skin is warm and dry.  Psychiatric: He has a normal mood and affect.    ED Course  Procedures (including critical care time)  Labs Reviewed - No data to display No results found.   1. Ginette Pitman       MDM  For rash        Arman Filter, NP 08/27/11 0404  Arman Filter, NP 08/27/11 4098

## 2011-08-27 NOTE — ED Notes (Signed)
Pt ambulatory to room, pt alert, NAD, calm, interactive, skin W&D, resps e/u, speaking clear complete sentences. 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 Pt seen by EDNP prior to RN & triage, see NP notes, pending orders.

## 2011-08-28 DIAGNOSIS — Z9289 Personal history of other medical treatment: Secondary | ICD-10-CM

## 2011-08-28 HISTORY — DX: Personal history of other medical treatment: Z92.89

## 2011-08-28 NOTE — ED Provider Notes (Signed)
Medical screening examination/treatment/procedure(s) were performed by non-physician practitioner and as supervising physician I was immediately available for consultation/collaboration.   Zarius Furr M Maxemiliano Riel, DO 08/28/11 0841 

## 2011-08-31 NOTE — ED Provider Notes (Signed)
Medical screening examination/treatment/procedure(s) were performed by non-physician practitioner and as supervising physician I was immediately available for consultation/collaboration.   Hobert Poplaski, MD 08/31/11 1655 

## 2011-09-07 ENCOUNTER — Encounter (HOSPITAL_COMMUNITY): Payer: Self-pay | Admitting: Emergency Medicine

## 2011-09-07 ENCOUNTER — Observation Stay (HOSPITAL_COMMUNITY)
Admission: EM | Admit: 2011-09-07 | Discharge: 2011-09-10 | Discharge status: Against Medical Advice | Payer: Medicare Other | Attending: Internal Medicine | Admitting: Internal Medicine

## 2011-09-07 ENCOUNTER — Other Ambulatory Visit (HOSPITAL_COMMUNITY): Payer: Self-pay | Admitting: Pharmacy Technician

## 2011-09-07 DIAGNOSIS — B370 Candidal stomatitis: Secondary | ICD-10-CM | POA: Insufficient documentation

## 2011-09-07 DIAGNOSIS — Z79899 Other long term (current) drug therapy: Secondary | ICD-10-CM | POA: Insufficient documentation

## 2011-09-07 DIAGNOSIS — E785 Hyperlipidemia, unspecified: Secondary | ICD-10-CM | POA: Insufficient documentation

## 2011-09-07 DIAGNOSIS — G40909 Epilepsy, unspecified, not intractable, without status epilepticus: Secondary | ICD-10-CM

## 2011-09-07 DIAGNOSIS — F17200 Nicotine dependence, unspecified, uncomplicated: Secondary | ICD-10-CM | POA: Insufficient documentation

## 2011-09-07 DIAGNOSIS — R4182 Altered mental status, unspecified: Secondary | ICD-10-CM

## 2011-09-07 DIAGNOSIS — I10 Essential (primary) hypertension: Secondary | ICD-10-CM | POA: Insufficient documentation

## 2011-09-07 DIAGNOSIS — F3289 Other specified depressive episodes: Secondary | ICD-10-CM | POA: Insufficient documentation

## 2011-09-07 DIAGNOSIS — G43909 Migraine, unspecified, not intractable, without status migrainosus: Principal | ICD-10-CM | POA: Insufficient documentation

## 2011-09-07 DIAGNOSIS — F329 Major depressive disorder, single episode, unspecified: Secondary | ICD-10-CM | POA: Insufficient documentation

## 2011-09-07 DIAGNOSIS — R569 Unspecified convulsions: Secondary | ICD-10-CM | POA: Insufficient documentation

## 2011-09-07 NOTE — ED Notes (Signed)
Pt returned back to conference room and refused to change clothes, he states that hes not here for mental health help hes here for seizures and a migraine. Charge RN explained to him that his original complaint was needing mental health help, but now he states he gets plenty of help for the last 14 years and doesn't need that tonight. Patient was placed back in the waiting room ans will be triaged based on acuity.

## 2011-09-07 NOTE — ED Notes (Signed)
Pt called from [facility] parking lot asking about the process of getting help, he states that hes not suicidal at this time but wants to prevent becoming that way. Pt is placed in the conference room in triage and has called the charge nurse phone 4 times, the process has been explained to him several times and he continues to be manipultive to the staff. Pt continues to deny suicidal ideation and is now requesting a AMA form.

## 2011-09-07 NOTE — ED Notes (Signed)
Pt states he has a hx of brain injury  Pt states he has a migraine today and believes he has had 2 seizures today   Pt states he woke up on the ground  Pt states he has sensativity to light   Pt states he has been under neurological tx for the past 14 years

## 2011-09-08 ENCOUNTER — Encounter (HOSPITAL_COMMUNITY): Payer: Self-pay

## 2011-09-08 ENCOUNTER — Emergency Department (HOSPITAL_COMMUNITY): Payer: Medicare Other

## 2011-09-08 LAB — CBC
HCT: 42.7 % (ref 39.0–52.0)
Hemoglobin: 15.1 g/dL (ref 13.0–17.0)
MCHC: 35.4 g/dL (ref 30.0–36.0)
MCV: 87.9 fL (ref 78.0–100.0)
RDW: 13.2 % (ref 11.5–15.5)

## 2011-09-08 LAB — BASIC METABOLIC PANEL
BUN: 14 mg/dL (ref 6–23)
CO2: 27 mEq/L (ref 19–32)
Calcium: 10.2 mg/dL (ref 8.4–10.5)
Chloride: 104 mEq/L (ref 96–112)
Creatinine, Ser: 0.76 mg/dL (ref 0.50–1.35)
GFR calc Af Amer: >90 mL/min (ref >90)

## 2011-09-08 LAB — DIFFERENTIAL
Basophils Relative: 0 % (ref 0–1)
Eosinophils Relative: 2 % (ref 0–5)
Monocytes Absolute: 0.6 10*3/uL (ref 0.1–1.0)
Monocytes Relative: 9 % (ref 3–12)
Neutro Abs: 3.5 10*3/uL (ref 1.7–7.7)

## 2011-09-08 LAB — RAPID URINE DRUG SCREEN, HOSP PERFORMED
Amphetamines: NOT DETECTED
Barbiturates: NOT DETECTED
Cocaine: NOT DETECTED
Opiates: POSITIVE — AB
Tetrahydrocannabinol: POSITIVE — AB

## 2011-09-08 LAB — CREATININE, SERUM
GFR calc Af Amer: >90 mL/min (ref >90)
GFR calc non Af Amer: >90 mL/min (ref >90)

## 2011-09-08 LAB — URINALYSIS, ROUTINE W REFLEX MICROSCOPIC
Bilirubin Urine: NEGATIVE
Hgb urine dipstick: NEGATIVE
Protein, ur: NEGATIVE mg/dL
Urobilinogen, UA: 0.2 mg/dL (ref 0.0–1.0)

## 2011-09-08 MED ORDER — METOCLOPRAMIDE HCL 5 MG/ML IJ SOLN
10.0000 mg | Freq: Once | INTRAMUSCULAR | Status: AC
  Administered 2011-09-08: 10 mg via INTRAVENOUS
  Filled 2011-09-08: qty 2

## 2011-09-08 MED ORDER — ONDANSETRON HCL 4 MG PO TABS: 4.0000 mg | ORAL_TABLET | Freq: Four times a day (QID) | ORAL | Status: DC | PRN

## 2011-09-08 MED ORDER — ONDANSETRON HCL 4 MG/2ML IJ SOLN
4.0000 mg | Freq: Four times a day (QID) | INTRAMUSCULAR | Status: DC | PRN
  Administered 2011-09-09: 4 mg via INTRAVENOUS
  Filled 2011-09-08: qty 2

## 2011-09-08 MED ORDER — DIPHENHYDRAMINE HCL 50 MG/ML IJ SOLN
25.0000 mg | Freq: Once | INTRAMUSCULAR | Status: AC
  Administered 2011-09-08: 25 mg via INTRAVENOUS
  Filled 2011-09-08: qty 1

## 2011-09-08 MED ORDER — LORAZEPAM 2 MG/ML IJ SOLN
1.0000 mg | INTRAMUSCULAR | Status: DC | PRN
  Administered 2011-09-08 – 2011-09-10 (×8): 1 mg via INTRAVENOUS
  Filled 2011-09-08 (×8): qty 1

## 2011-09-08 MED ORDER — LOVASTATIN ER 20 MG PO TB24: 20.0000 mg | ORAL_TABLET | Freq: Every day | ORAL | Status: DC

## 2011-09-08 MED ORDER — PROMETHAZINE HCL 25 MG/ML IJ SOLN
12.5000 mg | Freq: Once | INTRAMUSCULAR | Status: AC
  Administered 2011-09-08: 12.5 mg via INTRAVENOUS
  Filled 2011-09-08: qty 1

## 2011-09-08 MED ORDER — PROMETHAZINE HCL 25 MG PO TABS: 12.5000 mg | ORAL_TABLET | Freq: Four times a day (QID) | ORAL | Status: DC | PRN

## 2011-09-08 MED ORDER — SODIUM CHLORIDE 0.9 % IV SOLN
1000.0000 mg | Freq: Two times a day (BID) | INTRAVENOUS | Status: DC
  Administered 2011-09-08: 1000 mg via INTRAVENOUS
  Filled 2011-09-08 (×3): qty 10

## 2011-09-08 MED ORDER — SODIUM CHLORIDE 0.9 % IV SOLN
900.0000 mg | INTRAVENOUS | Status: AC
  Administered 2011-09-08: 900 mg via INTRAVENOUS
  Filled 2011-09-08: qty 18

## 2011-09-08 MED ORDER — KETOROLAC TROMETHAMINE 30 MG/ML IJ SOLN
30.0000 mg | Freq: Once | INTRAMUSCULAR | Status: AC
  Administered 2011-09-08: 30 mg via INTRAVENOUS
  Filled 2011-09-08: qty 1

## 2011-09-08 MED ORDER — SODIUM CHLORIDE 0.9 % IV SOLN: INTRAVENOUS | Status: DC

## 2011-09-08 MED ORDER — ENOXAPARIN SODIUM 40 MG/0.4ML ~~LOC~~ SOLN
40.0000 mg | SUBCUTANEOUS | Status: DC
  Administered 2011-09-09 – 2011-09-10 (×3): 40 mg via SUBCUTANEOUS
  Filled 2011-09-08 (×5): qty 0.4

## 2011-09-08 MED ORDER — SODIUM CHLORIDE 0.9 % IV SOLN
Freq: Once | INTRAVENOUS | Status: AC
  Administered 2011-09-08: 12:00:00 via INTRAVENOUS

## 2011-09-08 MED ORDER — PHENYTOIN SODIUM 50 MG/ML IJ SOLN
900.0000 mg | Freq: Once | INTRAMUSCULAR | Status: DC
  Filled 2011-09-08: qty 18

## 2011-09-08 MED ORDER — TRAMADOL HCL 50 MG PO TABS
50.0000 mg | ORAL_TABLET | Freq: Three times a day (TID) | ORAL | Status: DC
  Administered 2011-09-08 – 2011-09-10 (×5): 50 mg via ORAL
  Filled 2011-09-08 (×12): qty 1

## 2011-09-08 MED ORDER — LEVETIRACETAM 500 MG PO TABS
1000.0000 mg | ORAL_TABLET | Freq: Two times a day (BID) | ORAL | Status: DC
  Administered 2011-09-08: 1000 mg via ORAL
  Filled 2011-09-08 (×3): qty 2

## 2011-09-08 MED ORDER — NYSTATIN 100000 UNIT/ML MT SUSP
5.0000 mL | Freq: Four times a day (QID) | OROMUCOSAL | Status: DC
  Administered 2011-09-08 – 2011-09-09 (×2): 500000 [IU] via ORAL
  Filled 2011-09-08 (×13): qty 5

## 2011-09-08 MED ORDER — ACETAMINOPHEN 325 MG PO TABS: 650.0000 mg | ORAL_TABLET | Freq: Four times a day (QID) | ORAL | Status: DC | PRN

## 2011-09-08 MED ORDER — LAMOTRIGINE 150 MG PO TABS
150.0000 mg | ORAL_TABLET | ORAL | Status: DC
  Administered 2011-09-08: 150 mg via ORAL
  Filled 2011-09-08 (×3): qty 1

## 2011-09-08 MED ORDER — MORPHINE SULFATE 2 MG/ML IJ SOLN
1.0000 mg | Freq: Three times a day (TID) | INTRAMUSCULAR | Status: DC | PRN
  Administered 2011-09-08: 1 mg via INTRAVENOUS
  Filled 2011-09-08: qty 1

## 2011-09-08 MED ORDER — MIRTAZAPINE 15 MG PO TABS
15.0000 mg | ORAL_TABLET | Freq: Every day | ORAL | Status: DC
  Administered 2011-09-08 – 2011-09-09 (×2): 15 mg via ORAL
  Filled 2011-09-08 (×4): qty 1

## 2011-09-08 MED ORDER — NALOXONE HCL 1 MG/ML IJ SOLN
2.0000 mg | Freq: Once | INTRAMUSCULAR | Status: AC
  Administered 2011-09-08: 2 mg via INTRAVENOUS
  Filled 2011-09-08: qty 2

## 2011-09-08 MED ORDER — PHENYTOIN SODIUM EXTENDED 100 MG PO CAPS
100.0000 mg | ORAL_CAPSULE | Freq: Every day | ORAL | Status: DC
  Administered 2011-09-08: 100 mg via ORAL
  Filled 2011-09-08 (×2): qty 1

## 2011-09-08 MED ORDER — FLUOXETINE HCL 20 MG PO CAPS
60.0000 mg | ORAL_CAPSULE | Freq: Every day | ORAL | Status: DC
  Administered 2011-09-08 – 2011-09-10 (×3): 60 mg via ORAL
  Filled 2011-09-08 (×6): qty 3

## 2011-09-08 MED ORDER — ACETAMINOPHEN 650 MG RE SUPP: 650.0000 mg | Freq: Four times a day (QID) | RECTAL | Status: DC | PRN

## 2011-09-08 NOTE — ED Notes (Addendum)
MD at bedside. Dr. Nicanor Alcon at bedside speaking with pt.

## 2011-09-08 NOTE — ED Notes (Signed)
hospitalist at bedside

## 2011-09-08 NOTE — ED Notes (Signed)
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
## 2011-09-08 NOTE — ED Notes (Signed)
MD at bedside. Dr. Palombo at bedside.  

## 2011-09-08 NOTE — ED Notes (Signed)
Patient transported to X-ray 

## 2011-09-08 NOTE — ED Provider Notes (Signed)
History     CSN: [id number]  Arrival date & time 09/07/11  2040   First MD Initiated Contact with Patient 09/08/11 0316      Chief Complaint  Patient presents with  . Seizures  . Migraine    (Consider location/radiation/quality/duration/timing/severity/associated sxs/prior treatment) Patient is a 26 y.o. male presenting with seizures and migraine. The history is provided by medical records and the patient. The history is limited by the condition of the patient. No language interpreter was used.  Seizures  This is a recurrent problem. The current episode started yesterday. The problem has been resolved. There were 2 to 3 seizures. Pertinent negatives include no visual disturbance, no neck stiffness, no chest pain and no cough. There was no sensation of an aura present. The seizure(s) had no focality. There has been no fever. There were no medications administered prior to arrival.  Migraine Pertinent negatives include no chest pain.    Past Medical History  Diagnosis Date  . Seizures   . Traumatic brain injury   . Hypertension   . Hyperlipemia   . Bipolar 1 disorder   . Migraine     Past Surgical History  Procedure Date  . Brain surgery   . Right arm growth plate     Family History  Problem Relation Age of Onset  . COPD Mother   . Coronary artery disease Mother   . Stroke Mother   . Diabetes Mother   . Diabetes Father     History  Substance Use Topics  . Smoking status: Current Everyday Smoker -- 1.5 packs/day    Types: Cigarettes  . Smokeless tobacco: Not on file  . Alcohol Use: No      Review of Systems  Constitutional: Negative.   HENT: Negative for neck pain and neck stiffness.   Eyes: Negative for photophobia and visual disturbance.  Respiratory: Negative for cough.   Cardiovascular: Negative for chest pain.  Genitourinary: Negative.   Musculoskeletal: Negative.   Neurological: Positive for seizures.  Hematological: Negative.     Psychiatric/Behavioral: Negative.     Allergies  Iodine and Shellfish allergy  Home Medications   Current Outpatient Rx  Name Route Sig Dispense Refill  . CLONAZEPAM 1 MG PO TABS Oral Take 1 tablet (1 mg total) by mouth 3 (three) times daily as needed. For anxiety 90 tablet 0  . FLUOXETINE HCL 20 MG PO CAPS Oral Take 60 mg by mouth at bedtime. For Depression    . HYDROCHLOROTHIAZIDE 25 MG PO TABS Oral Take 1 tablet (25 mg total) by mouth daily. For control of high blood pressure 30 tablet 0  . LAMOTRIGINE 150 MG PO TABS Oral Take 1 tablet (150 mg total) by mouth 2 (two) times daily in the am and at bedtime.. For control of mood and seizures. 60 tablet 0  . LEVETIRACETAM 250 MG PO TABS Oral Take 1,000 mg by mouth 2 (two) times daily. For control of seizures. 250 every morning and 500 every night    . LOVASTATIN ER 20 MG PO TB24 Oral Take 1 tablet (20 mg total) by mouth daily before breakfast. To help lower cholesterol 30 tablet 0  . MIRTAZAPINE 15 MG PO TABS Oral Take 1 tablet (15 mg total) by mouth at bedtime. 30 tablet 0  . PHENYTOIN SODIUM EXTENDED 100 MG PO CAPS Oral Take 100 mg by mouth at bedtime. For control of seizures with Dilantin and Keppra and Lamictal      BP 114/74  Pulse 67  Temp(Src) 97.4 F (36.3 C) (Oral)  Resp 15  SpO2 97%  Physical Exam  Constitutional: He appears well-developed and well-nourished.  HENT:  Head: Normocephalic.  Right Ear: No hemotympanum.  Left Ear: No hemotympanum.  Mouth/Throat: Oropharynx is clear and moist. No oropharyngeal exudate.  Eyes: Pupils are equal, round, and reactive to light.  Neck: Normal range of motion. Neck supple. No thyromegaly present.  Cardiovascular: Normal rate and regular rhythm.   Pulmonary/Chest: Effort normal and breath sounds normal.  Abdominal: Soft. Bowel sounds are normal. There is no tenderness. There is no rebound and no guarding.  Musculoskeletal: Normal range of motion. He exhibits no edema and no  tenderness.  Neurological: He is alert. He has normal reflexes.  Skin: Skin is warm and dry.    ED Course  Procedures (including critical care time)  Labs Reviewed  BASIC METABOLIC PANEL - Abnormal; Notable for the following:    Glucose, Bld 105 (*)    All other components within normal limits  PHENYTOIN LEVEL, TOTAL - Abnormal; Notable for the following:    Phenytoin Lvl <2.5 (*)    All other components within normal limits  SALICYLATE LEVEL - Abnormal; Notable for the following:    Salicylate Lvl <2.0 (*)    All other components within normal limits  URINE RAPID DRUG SCREEN (HOSP PERFORMED) - Abnormal; Notable for the following:    Opiates POSITIVE (*)    Benzodiazepines POSITIVE (*)    Tetrahydrocannabinol POSITIVE (*)    All other components within normal limits  CBC  DIFFERENTIAL  ACETAMINOPHEN LEVEL  URINALYSIS, ROUTINE W REFLEX MICROSCOPIC   Ct Head Wo Contrast  09/08/2011  *RADIOLOGY REPORT*  Clinical Data: Migraine headaches.  To possible seizures.  History of right frontal brain surgery post-traumatic injury 14 years ago.  CT HEAD WITHOUT CONTRAST  Technique:  Contiguous axial images were obtained from the base of the skull through the vertex without contrast.  Comparison: 10/29/2009  Findings: Postoperative changes with frontal craniotomy and plate screw fixation.  Focal encephalomalacia in the right anterior frontal lobe without change.  Ventricles and sulci are otherwise symmetrical.  No mass effect or midline shift.  No abnormal extra- axial fluid collections.  Gray-white matter junctions are distinct. Basal cisterns are not effaced.  No ventricular dilatation.  No evidence of acute intracranial hemorrhage.  Nasal bone fractures without soft tissue swelling, likely chronic.  No depressed skull fractures.  Stable appearance since previous study.  IMPRESSION: Postoperative changes with frontal craniotomy and right frontal encephalomalacia.  No evidence of acute intracranial  hemorrhage, mass lesion, or acute infarct.  Original Report Authenticated By: Marlon Pel, M.D.     1. Seizure disorder       MDM  Seen multiple times by EDP wanting narcotics for headaches.  EDP stated he will not get narcotics.  Repeatedly states he needs to see EDP.  EDP has been in the room multiple times and reintroduced self        Dema Timmons K Bina Veenstra-Rasch, MD 09/08/11 [phone]
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
## 2011-09-08 NOTE — ED Provider Notes (Signed)
BP 106/61  Pulse 63  Temp(Src) 97.9 F (36.6 C) (Oral)  Resp 14  SpO2 95%  Patient with continued AMS. He has been observed in the ED for 15 hours. Amnestic to events throughout his course here. Perseverating on seeing a physician, has been seen x3 by Dr. Nicanor Alcon as well as once by myself. Continues c/o headache. Reglan, benadryl. Admitted to triad hospitalist.  Forbes Cellar, MD 09/08/11 1155

## 2011-09-08 NOTE — ED Notes (Signed)
Pt called from triage with no answer 

## 2011-09-08 NOTE — ED Notes (Signed)
Pt resting in bed with both side rails up with eyes closed.

## 2011-09-08 NOTE — H&P (Signed)
PCP:   No primary provider on file.   Chief Complaint:  Altered mental status  HPI: 26 year old male with h/o brain ijury , seizures came in for altered mental status. Pt is a poor historian, as he dozes in the middle of a sentence. He reports headache. No other complaints. As per the ED RN, pt walked to the bathroom by himself.    Review of Systems:  The patient denies anorexia, fever, weight loss,, vision loss, decreased hearing, hoarseness, chest pain, syncope, dyspnea on exertion, peripheral edema, balance deficits, hemoptysis, abdominal pain, melena, hematochezia, severe indigestion/heartburn, hematuria, incontinence, genital sores, muscle weakness, suspicious skin lesions, transient blindness, difficulty walking, depression, unusual weight change, abnormal bleeding, enlarged lymph nodes, angioedema, and breast masses.  Past Medical History: Past Medical History  Diagnosis Date  . Seizures   . Traumatic brain injury   . Hypertension   . Hyperlipemia   . Bipolar 1 disorder   . Migraine    Past Surgical History  Procedure Date  . Brain surgery   . Right arm growth plate     Medications: Prior to Admission medications   Medication Sig Start Date End Date Taking? Authorizing Provider  clonazePAM (KLONOPIN) 1 MG tablet Take 1 tablet (1 mg total) by mouth 3 (three) times daily as needed. For anxiety 08/13/11   Orson Aloe, MD  FLUoxetine (PROZAC) 20 MG capsule Take 60 mg by mouth at bedtime. For Depression 08/13/11 08/12/12  Orson Aloe, MD  hydrochlorothiazide (HYDRODIURIL) 25 MG tablet Take 1 tablet (25 mg total) by mouth daily. For control of high blood pressure 08/13/11   Orson Aloe, MD  lamoTRIgine (LAMICTAL) 150 MG tablet Take 1 tablet (150 mg total) by mouth 2 (two) times daily in the am and at bedtime.. For control of mood and seizures. 08/13/11 08/12/12  Orson Aloe, MD  levETIRAcetam (KEPPRA) 250 MG tablet Take 1,000 mg by mouth 2 (two) times daily. For control of  seizures. 250 every morning and 500 every night 08/13/11   Orson Aloe, MD  lovastatin (ALTOPREV) 20 MG 24 hr tablet Take 1 tablet (20 mg total) by mouth daily before breakfast. To help lower cholesterol 08/13/11   Orson Aloe, MD  mirtazapine (REMERON) 15 MG tablet Take 1 tablet (15 mg total) by mouth at bedtime. 08/13/11 09/12/11  Orson Aloe, MD  phenytoin (DILANTIN) 100 MG ER capsule Take 100 mg by mouth at bedtime. For control of seizures with Dilantin and Keppra and Lamictal 08/13/11   Orson Aloe, MD    Allergies:   Allergies  Allergen Reactions  . Iodine Anaphylaxis  . Shellfish Allergy Anaphylaxis    Social History:  reports that he has been smoking Cigarettes.  He has been smoking about 1.5 packs per day. He has never used smokeless tobacco. He reports that he does not drink alcohol or use illicit drugs.   Family History: Family History  Problem Relation Age of Onset  . COPD Mother   . Coronary artery disease Mother   . Stroke Mother   . Diabetes Mother   . Diabetes Father     Physical Exam: Filed Vitals:   09/08/11 0837 09/08/11 1118 09/08/11 1345 09/08/11 1400  BP: 116/70 106/61  101/49  Pulse: 61 63 60 63  Temp: 97.9 F (36.6 C)     TempSrc: Oral     Resp: 16 14 16 16   SpO2: 100% 95% 95% 99%   Constitutional: Vital signs reviewed.  Patient is a well-developed and well-nourished in  no acute distress and cooperative with exam. very sleepy.  Head: Normocephalic and atraumatic Mouth: no erythema or exudates, MMM Eyes: PERRL, EOMI, conjunctivae normal, No scleral icterus.  Neck: Supple, Trachea midline normal ROM, No JVD, mass, thyromegaly, or carotid bruit present.  Cardiovascular: RRR, S1 normal, S2 normal, no MRG, pulses symmetric and intact bilaterally Pulmonary/Chest: CTAB, no wheezes, rales, or rhonchi Abdominal: Soft. Non-tender, non-distended, bowel sounds are normal, no masses, organomegaly, or guarding present.  Musculoskeletal: No joint deformities,  erythema, or stiffness, ROM full and no nontender Hematology: no cervical, inginal, or axillary adenopathy.  Neurological: arousable, Strenght is normal and symmetric bilaterally, cranial nerve II-XII are grossly intact, no focal motor deficit, sensory intact to light touch bilaterally.  Skin: Warm, dry and intact. No rash, cyanosis, or clubbing.       Labs on Admission:   [facility] 09/08/11 0318  NA 140  K 3.8  CL 104  CO2 27  GLUCOSE 105*  BUN 14  CREATININE 0.76  CALCIUM 10.2  MG --  PHOS --   No results found for this basename: AST:2,ALT:2,ALKPHOS:2,BILITOT:2,PROT:2,ALBUMIN:2 in the last 72 hours No results found for this basename: LIPASE:2,AMYLASE:2 in the last 72 hours  Basename 09/08/11 0318  WBC 7.4  NEUTROABS 3.5  HGB 15.1  HCT 42.7  MCV 87.9  PLT 222   No results found for this basename: CKTOTAL:3,CKMB:3,CKMBINDEX:3,TROPONINI:3 in the last 72 hours No results found for this basename: TSH,T4TOTAL,FREET3,T3FREE,THYROIDAB in the last 72 hours No results found for this basename: VITAMINB12:2,FOLATE:2,FERRITIN:2,TIBC:2,IRON:2,RETICCTPCT:2 in the last 72 hours  Radiological Exams on Admission: Ct Head Wo Contrast  09/08/2011  *RADIOLOGY REPORT*  Clinical Data: Migraine headaches.  To possible seizures.  History of right frontal brain surgery post-traumatic injury 14 years ago.  CT HEAD WITHOUT CONTRAST  Technique:  Contiguous axial images were obtained from the base of the skull through the vertex without contrast.  Comparison: 10/29/2009  Findings: Postoperative changes with frontal craniotomy and plate screw fixation.  Focal encephalomalacia in the right anterior frontal lobe without change.  Ventricles and sulci are otherwise symmetrical.  No mass effect or midline shift.  No abnormal extra- axial fluid collections.  Gray-white matter junctions are distinct. Basal cisterns are not effaced.  No ventricular dilatation.  No evidence of acute intracranial hemorrhage.  Nasal  bone fractures without soft tissue swelling, likely chronic.  No depressed skull fractures.  Stable appearance since previous study.  IMPRESSION: Postoperative changes with frontal craniotomy and right frontal encephalomalacia.  No evidence of acute intracranial hemorrhage, mass lesion, or acute infarct.  Original Report Authenticated By: Marlon Pel, M.D.    Assessment/Plan Present on Admission:  1.Altered Mental Status: Most likely secondary to drug overdose. His urine is positive for opiates, benzodiazepines and marijuana. His electrolytes are within normal limits. He doesn't appear to be toxic. He wakes up on arousal, asks for pain medication and dozes off. He doesn't appear to be post ictal, and did not witness any seizure activity all day.   I do not think he needs EEG. He is already started on anti seizure medications. His alcohol level is less than 11.   2. Seizures: as per the note from ED, multiple seizures yesterday, most likely secondary to non compliant to medications evident with low dilantin levels.  Resume his home medications, on seizure precautions.   3. Migraine headache; as per the pt, the conventional medications for migraine don;;t work for him. He keeps repeating that he get a cocktail of dilaudid/morphine/phenergan/benadryl and Toradol,  every time he gets a migraine headache. I started hm on low dose morphine and phenergan prn.   Disposition: possibly d/c home in am if his AMS is resolved.   After discussion with the patient, he is to be a full code.  We will respect these wishes.    Time spent on this patient including examination and decision-making process: 45 minutes.  Awesome Jared [phone] 09/08/2011, 2:43 PM

## 2011-09-08 NOTE — ED Provider Notes (Signed)
History     CSN: [id number]  Arrival date & time 09/07/11  2040   First MD Initiated Contact with Patient 09/08/11 0316      Chief Complaint  Patient presents with  . Seizures  . Migraine    (Consider location/radiation/quality/duration/timing/severity/associated sxs/prior treatment) HPI  Past Medical History  Diagnosis Date  . Seizures   . Traumatic brain injury   . Hypertension   . Hyperlipemia   . Bipolar 1 disorder   . Migraine     Past Surgical History  Procedure Date  . Brain surgery   . Right arm growth plate     Family History  Problem Relation Age of Onset  . COPD Mother   . Coronary artery disease Mother   . Stroke Mother   . Diabetes Mother   . Diabetes Father     History  Substance Use Topics  . Smoking status: Current Everyday Smoker -- 1.5 packs/day    Types: Cigarettes  . Smokeless tobacco: Not on file  . Alcohol Use: No      Review of Systems  Allergies  Iodine and Shellfish allergy  Home Medications   Current Outpatient Rx  Name Route Sig Dispense Refill  . CLONAZEPAM 1 MG PO TABS Oral Take 1 tablet (1 mg total) by mouth 3 (three) times daily as needed. For anxiety 90 tablet 0  . FLUOXETINE HCL 20 MG PO CAPS Oral Take 60 mg by mouth at bedtime. For Depression    . HYDROCHLOROTHIAZIDE 25 MG PO TABS Oral Take 1 tablet (25 mg total) by mouth daily. For control of high blood pressure 30 tablet 0  . LAMOTRIGINE 150 MG PO TABS Oral Take 1 tablet (150 mg total) by mouth 2 (two) times daily in the am and at bedtime.. For control of mood and seizures. 60 tablet 0  . LEVETIRACETAM 250 MG PO TABS Oral Take 1,000 mg by mouth 2 (two) times daily. For control of seizures. 250 every morning and 500 every night    . LOVASTATIN ER 20 MG PO TB24 Oral Take 1 tablet (20 mg total) by mouth daily before breakfast. To help lower cholesterol 30 tablet 0  . MIRTAZAPINE 15 MG PO TABS Oral Take 1 tablet (15 mg total) by mouth at bedtime. 30 tablet 0  .  PHENYTOIN SODIUM EXTENDED 100 MG PO CAPS Oral Take 100 mg by mouth at bedtime. For control of seizures with Dilantin and Keppra and Lamictal      BP 114/74  Pulse 67  Temp(Src) 97.4 F (36.3 C) (Oral)  Resp 15  SpO2 97%  Physical Exam  ED Course  Procedures (including critical care time)  Labs Reviewed  BASIC METABOLIC PANEL - Abnormal; Notable for the following:    Glucose, Bld 105 (*)    All other components within normal limits  PHENYTOIN LEVEL, TOTAL - Abnormal; Notable for the following:    Phenytoin Lvl <2.5 (*)    All other components within normal limits  SALICYLATE LEVEL - Abnormal; Notable for the following:    Salicylate Lvl <2.0 (*)    All other components within normal limits  URINE RAPID DRUG SCREEN (HOSP PERFORMED) - Abnormal; Notable for the following:    Opiates POSITIVE (*)    Benzodiazepines POSITIVE (*)    Tetrahydrocannabinol POSITIVE (*)    All other components within normal limits  CBC  DIFFERENTIAL  ACETAMINOPHEN LEVEL  URINALYSIS, ROUTINE W REFLEX MICROSCOPIC   Ct Head Wo Contrast  09/08/2011  *RADIOLOGY  REPORT*  Clinical Data: Migraine headaches.  To possible seizures.  History of right frontal brain surgery post-traumatic injury 14 years ago.  CT HEAD WITHOUT CONTRAST  Technique:  Contiguous axial images were obtained from the base of the skull through the vertex without contrast.  Comparison: 10/29/2009  Findings: Postoperative changes with frontal craniotomy and plate screw fixation.  Focal encephalomalacia in the right anterior frontal lobe without change.  Ventricles and sulci are otherwise symmetrical.  No mass effect or midline shift.  No abnormal extra- axial fluid collections.  Gray-white matter junctions are distinct. Basal cisterns are not effaced.  No ventricular dilatation.  No evidence of acute intracranial hemorrhage.  Nasal bone fractures without soft tissue swelling, likely chronic.  No depressed skull fractures.  Stable appearance since  previous study.  IMPRESSION: Postoperative changes with frontal craniotomy and right frontal encephalomalacia.  No evidence of acute intracranial hemorrhage, mass lesion, or acute infarct.  Original Report Authenticated By: Marlon Pel, M.D.     1. Seizure disorder    6:31 AM Patient with h/o seizure, seizures today. Awaiting dilantin load and will be d/c to home.   7:56 AM Still awaiting dilantin from pharmacy.   10:22 AM Dilantin infusion complete.   11:53 AM Pt able to walk with assistance, but remains altered. Admission called by Dr. Hyman Hopes.   MDM  Admit for altered LOC.        Carolee Rota, PA 09/08/11 [address] [place], [place] 09/08/11 1153

## 2011-09-08 NOTE — Discharge Instructions (Signed)
You likely had a seizure disorder today because your dilantin level was so low.  Take all of your medications as prescribed.  Follow up with your neurologist.  If you do not have one, call [facility] to schedule an appointment.  You may return to the ER if you have any other concerns.  Driving and Equipment Restrictions Some medical problems make it dangerous to drive, ride a bike, or use machines. Some of these problems are:  A hard blow to the head (concussion).   Passing out (fainting).   Twitching and shaking (seizures).   Low blood sugar.   Taking medicine to help you relax (sedatives).   Taking pain medicines.   Wearing an eye patch.   Wearing splints. This can make it hard to use parts of your body that you need to drive safely.  HOME CARE   Do not drive until your doctor says it is okay.   Do not use machines until your doctor says it is okay.  You may need a form signed by your doctor (medical release) before you can drive again. You may also need this form before you do other tasks where you need to be fully alert. MAKE SURE YOU:  Understand these instructions.   Will watch your condition.   Will get help right away if you are not doing well or get worse.  Document Released: 08/20/2004 Document Revised: 03/25/2011 Document Reviewed: 11/20/2009 [facility] Patient Information 2012 [place], [place].

## 2011-09-08 NOTE — ED Notes (Addendum)
Pt keeps requesting to see md. rn told this to physician assistant, who stated that the md has been in pts room 3 times over the night. Pt is drowsy and not alert and does not remember at all that the md had talked to him. 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 Pt will be asleep and not even wake up for monitors beeping loudly. Pt was laying in bed with eyes closed with one side rail up, ct staff walked by and reported that pt was laying on floor. rn walked in immediately and assessed pt, pt reported that "he was fine" and denies hitting head. rn went and alerted physician assistant that pt had rolled out of bed. A few min later p then tried to get up and walk to pt while still being hooked to all leads and lines to go to the bathroom. Pt was assisted to the bathroom with one staff member. At present pt has dilantin infusing. Pt is unkept and not mentally alert.  Pt is now back in bed with both side rails up hooked back up to the monitor.

## 2011-09-08 NOTE — ED Notes (Signed)
Ambulated patient 70 feet. Had to give assistance to patient with keeping his balance.

## 2011-09-08 NOTE — ED Notes (Signed)
rn assessed pt. Pt is repeating the exact same sentenance over and over without realizing or remembering. md alerted and reports she will check on pt again.

## 2011-09-09 ENCOUNTER — Other Ambulatory Visit: Payer: Self-pay

## 2011-09-09 ENCOUNTER — Observation Stay (HOSPITAL_COMMUNITY)
Admit: 2011-09-09 | Discharge: 2011-09-09 | Disposition: A | Discharge status: Home / Self Care | Payer: Medicare Other | Attending: Neurology | Admitting: Neurology

## 2011-09-09 LAB — ALBUMIN: Albumin: 3.5 g/dL (ref 3.5–5.2)

## 2011-09-09 LAB — COMPREHENSIVE METABOLIC PANEL
AST: 14 U/L (ref 0–37)
CO2: 24 mEq/L (ref 19–32)
Calcium: 9.3 mg/dL (ref 8.4–10.5)
Creatinine, Ser: 0.82 mg/dL (ref 0.50–1.35)
GFR calc Af Amer: >90 mL/min (ref >90)
GFR calc non Af Amer: >90 mL/min (ref >90)
Glucose, Bld: 92 mg/dL (ref 70–99)
Sodium: 139 mEq/L (ref 135–145)
Total Protein: 6.3 g/dL (ref 6.0–8.3)

## 2011-09-09 LAB — CBC
MCH: 30.7 pg (ref 26.0–34.0)
MCHC: 34.4 g/dL (ref 30.0–36.0)
MCV: 89.4 fL (ref 78.0–100.0)
Platelets: 207 10*3/uL (ref 150–400)
RBC: 4.72 MIL/uL (ref 4.22–5.81)

## 2011-09-09 MED ORDER — MORPHINE SULFATE 2 MG/ML IJ SOLN
1.0000 mg | INTRAMUSCULAR | Status: DC | PRN
  Administered 2011-09-09 – 2011-09-10 (×8): 1 mg via INTRAVENOUS
  Filled 2011-09-09 (×9): qty 1

## 2011-09-09 MED ORDER — KETOROLAC TROMETHAMINE 30 MG/ML IJ SOLN
30.0000 mg | Freq: Three times a day (TID) | INTRAMUSCULAR | Status: DC | PRN
  Administered 2011-09-09 (×2): 30 mg via INTRAVENOUS
  Filled 2011-09-09 (×2): qty 1

## 2011-09-09 MED ORDER — DIPHENHYDRAMINE HCL 50 MG/ML IJ SOLN
12.5000 mg | Freq: Three times a day (TID) | INTRAMUSCULAR | Status: DC
  Administered 2011-09-09 – 2011-09-10 (×3): 12.5 mg via INTRAVENOUS
  Filled 2011-09-09 (×2): qty 0.25
  Filled 2011-09-09: qty 1
  Filled 2011-09-09: qty 0.25
  Filled 2011-09-09: qty 1
  Filled 2011-09-09 (×3): qty 0.25
  Filled 2011-09-09 (×2): qty 1

## 2011-09-09 MED ORDER — LEVETIRACETAM 500 MG PO TABS
1000.0000 mg | ORAL_TABLET | Freq: Two times a day (BID) | ORAL | Status: DC
  Administered 2011-09-09 – 2011-09-10 (×3): 1000 mg via ORAL
  Filled 2011-09-09 (×5): qty 2

## 2011-09-09 MED ORDER — PHENYTOIN SODIUM EXTENDED 100 MG PO CAPS
100.0000 mg | ORAL_CAPSULE | Freq: Three times a day (TID) | ORAL | Status: DC
  Administered 2011-09-09 – 2011-09-10 (×4): 100 mg via ORAL
  Filled 2011-09-09 (×8): qty 1

## 2011-09-09 MED ORDER — LAMOTRIGINE 150 MG PO TABS
150.0000 mg | ORAL_TABLET | Freq: Two times a day (BID) | ORAL | Status: DC
  Administered 2011-09-09 – 2011-09-10 (×3): 150 mg via ORAL
  Filled 2011-09-09 (×5): qty 1

## 2011-09-09 MED ORDER — CLONAZEPAM 1 MG PO TABS
1.0000 mg | ORAL_TABLET | Freq: Three times a day (TID) | ORAL | Status: DC
  Administered 2011-09-09 – 2011-09-10 (×4): 1 mg via ORAL
  Filled 2011-09-09 (×4): qty 1

## 2011-09-09 MED ORDER — METOCLOPRAMIDE HCL 5 MG/ML IJ SOLN
10.0000 mg | Freq: Three times a day (TID) | INTRAMUSCULAR | Status: DC
  Administered 2011-09-09 – 2011-09-10 (×2): 10 mg via INTRAVENOUS
  Filled 2011-09-09 (×7): qty 2

## 2011-09-09 MED ORDER — SUMATRIPTAN SUCCINATE 6 MG/0.5ML ~~LOC~~ SOLN
6.0000 mg | Freq: Once | SUBCUTANEOUS | Status: DC
  Filled 2011-09-09: qty 0.5

## 2011-09-09 MED ORDER — SIMVASTATIN 10 MG PO TABS
10.0000 mg | ORAL_TABLET | Freq: Every day | ORAL | Status: DC
  Administered 2011-09-09 – 2011-09-10 (×2): 10 mg via ORAL
  Filled 2011-09-09 (×3): qty 1

## 2011-09-09 MED ORDER — MORPHINE SULFATE 2 MG/ML IJ SOLN
INTRAMUSCULAR | Status: AC
  Administered 2011-09-09: 1 mg via INTRAVENOUS
  Filled 2011-09-09: qty 1

## 2011-09-09 MED ORDER — METOCLOPRAMIDE HCL 5 MG/ML IJ SOLN
10.0000 mg | Freq: Three times a day (TID) | INTRAMUSCULAR | Status: DC
  Administered 2011-09-09 – 2011-09-10 (×4): 10 mg via INTRAVENOUS
  Filled 2011-09-09 (×10): qty 2

## 2011-09-09 MED ORDER — DIPHENHYDRAMINE HCL 50 MG/ML IJ SOLN
12.5000 mg | Freq: Three times a day (TID) | INTRAMUSCULAR | Status: DC
  Administered 2011-09-09 (×2): 12.5 mg via INTRAVENOUS
  Filled 2011-09-09: qty 1
  Filled 2011-09-09 (×3): qty 0.25

## 2011-09-09 MED ORDER — GI COCKTAIL ~~LOC~~
30.0000 mL | Freq: Once | ORAL | Status: DC
  Filled 2011-09-09: qty 30

## 2011-09-09 MED ORDER — KETOROLAC TROMETHAMINE 30 MG/ML IJ SOLN
30.0000 mg | Freq: Three times a day (TID) | INTRAMUSCULAR | Status: DC | PRN
  Filled 2011-09-09: qty 1

## 2011-09-09 NOTE — Progress Notes (Signed)
Noted patient standing at the desk.  I stood at the desk during an inservice and noted patient heading toward his room staggering from wall to wall.  Patient had refused to stay in room, refused to have a bed alarm and refused all other safety measures that we attempted to implement.  After noticing patient was staggering I headed toward the room where patient fell in the doorway and "had a seizure".  Dr. Wandra Mannan had been sitting at cubby beside the patient's room, stated she heard the patient say "I am going to have a seizure" and then fell to floor.  When charge nurse got there he was noted to be shaking and banging feet against the floor.  Staff immediately came to the room, patient laying in floor, got vitals that were stable.  We immediately stated he needed a safety sitter as this is his second fall of the day, mentioned medical restraints for safety at which time patient stated that was not necessary.  Did not appear post ictal in any way.  Immediately was assisted to bed by staff, demanding to see another doctor.  I told him that Dr. Darnelle Catalan had been called and she was coming to see him.  Dr. Isidoro Donning did order a safety sitter to keep patient safe.  Dr. Isidoro Donning also called neurology to have them see him again.

## 2011-09-09 NOTE — Consult Note (Signed)
Called to re-evaluate the patient. Was told that Dr. Wandra Mannan witness patient saying that he was going to have a seizure and then falling and shaking on the ground without tongue biting and incontinence. Just previously the patient was denied narcotics by Dr. Isidoro Donning. The patient subsequently insulted her and and threatened her with a lawsuit. His alleged neurologist has not seen him since 2007 and has no record of giving him any drug cocktail for his headache. Patient has a prior drug seeking behavior in showing up to ED 4 times asking for narcotics. 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 He did not mention anything about seizures to me today, but now tells me that he had seen flashing lights and metallic taste in his mouth before the even. He now tells me that he is on Dilantin 100 mg PO tid, Keppra 500 mg PO bid, Lamictal 150 mg PO bid and Klonipin 1 mg PO tid. We will obtain a routine EEG on the patient. I suspect drug seeking behavior as the reason behind patient's presentation.  Carmell Austria, MD

## 2011-09-09 NOTE — Progress Notes (Signed)
UR completed 

## 2011-09-09 NOTE — Progress Notes (Signed)
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 and know how to protect myself with I fall," informed patient after my assessment to make sure patient was not injured took Vital Signs and checked for signs of injury, none noted on body area from head to toe, patient in stable condition at this time, Press photographer and Primary Nurse spoke with patient about the need for safety: 1) Bed alarm applied, 2) chair alarm applied, yellow arm band in place, red slip sock on at time of fall, spoke with patient extensively about the importance of calling for help before getting up from the bed or trying to go to the bathroom, but patient felt as if we were treating him as a child and refuse to comply with the Nursing staff protocol for patient's that are at high risk for falls, patient just continued to be argument about the fact that he did not want to be treated like a child and did not to these alarm, stated," I am very aware of what I am doing and I am not going to hurt myself I do not need these alarms on me," [facility] was present during these conversation that took place with this patient, for the patient had requested that we call the Nurse that was in charge of this Unit patient still refuse bed alarm and chair alarm, AC accommodated patient request not to have the alarms in place due to the fact the patient was so against having them in place, patient stated," I will comply with staff request and call for help when need, patient in stable condition at this time.

## 2011-09-09 NOTE — Progress Notes (Signed)
Patient stormed up to desk demanding to know Dr. Fransico Him full name and medical license number.  He stated he wanted another doctor and he had called joint commission and his lawyer and that he was going to sue Dr. Isidoro Donning.  Dr. Isidoro Donning told him that Dr. Darnelle Catalan is on her way to speak with him and she could assign him a different doctor.  At that point patient stated he was going to sue Dr. Isidoro Donning and called her a bitch in front of multiple staff members.  He then stormed back to his room.

## 2011-09-09 NOTE — Progress Notes (Signed)
Ptc/o Migraine pain and wrote down a "cocktail" of meds his nuerologist gives him that works very well (i.e. IV dilaudid, toradol, benadryl, and zofran together).  Pt states he cannot take some meds due to hx of brain injury, and requested the on call MD adjust his current prn medications.  MD on call notified, discussed hx with her and went over previous MD notes before making adjustments.  Morphine frequency was increased; and zofran and ativan were given as well.Marland Kitchen  He said we would need to call the MD back within 30 minutes if he did not have relief.  Within those 30 minutes pt repeatedly called the switchboard and the [facility] was notified.  She came up to his room to speak with the pt and it was explained to him that the on call MD would be paged again to come see him and assess his medical needs soon.  Pt still agitated and continues to ask for his specified cocktail of meds. PT also states he is not a drug seeker, even though his UDS was positive for multiple things.  Pt ahs also been adjusting the rate of his IVF even though he has been told not to touch the pump.  AC is currently on the floor and the MD has been paged again.  Will continue to monitor the pt and try to meet his needs as the MD orders.

## 2011-09-09 NOTE — Progress Notes (Signed)
After take a walk down the hall and returning to his room patient's IV some how come loose from it port of attachment and started dripping blood on the floor, patient somehow reattached IV to port, after I reassess patient's IV site and find that IV was still intact without any signs of infiltration, IV still patent and infusing at this time, clean patient's arm and flushed IV site, patient in stable condition at this time

## 2011-09-09 NOTE — Progress Notes (Signed)
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  I brought nurse Little with me to interview the patient.   I have reviewed his records (after obtaining his permission to do so), spoken with the patient and spoken with Dr. Isidoro Donning as well as Gertie Exon, his nurse.  The patient has essentially demanded a cocktail of dilaudid, benadryl, zofran, and toradol to abort his current complaints of migraine headache.  I did confront him about his UDS being positive for opiates, benzodiazepines and marijuana.  He claims that he is on Clonopin which would make the opiate and benzodiazepine positive, and that he was in the room where someone else was smoking marijuana, and that this is why he had THC in his UDS.  He currently has multiple medications ordered for pain including Toradol and Morphine (which he received today) as well as Ultram.  He also received Benadryl and Reglan today.  I advised him that I would not undermine the care given to him by Dr. Isidoro Donning and Dr. Lyman Speller nor would I order medications as a result of his threats to sue.  I encouraged him to take the medications that have already been ordered, and advised that I would add a triptan.  He also has asked for a doctor other than Dr. Isidoro Donning.  I  Offered to assume his care, and he prefers I not assume his care.

## 2011-09-09 NOTE — ED Provider Notes (Signed)
Medical screening examination/treatment/procedure(s) were performed by non-physician practitioner and as supervising physician I was immediately available for consultation/collaboration.  Jasmine Awe, MD 09/09/11 [phone]

## 2011-09-09 NOTE — Progress Notes (Addendum)
Patient came to desk and asked to speak with Dr Isidoro Donning informed patient that Dr was busy doing dictation and that as soon as she finished that she would come to his room, so I Nurse Morgane Joerger asked patient if he would go back to his room and wait on the Dr, patient was anxious and wanted to speak with the doctor at this time, so Dr had completed her documentation and turn around to speak with the patient about his medications, patient stated that his mother was on the phone and that she could speak with her about the medications that have been prescribe by his Neurologist," Dr explained that she had already spoke with his Neurologist and had our hospital Neurologist come and see you and they both stated not to [facility] with narcotic for migraines, at this time Dr Isidoro Donning informed patient that she had spoken with Dr Loreta Ave and that he was not in the office on this day, Dr Isidoro Donning explain to the patient for a third and fourth time that she was not going to treat him with Dilaudid because both Neurologist recommended against treating migraines with narcotic, patient became very upset at this point stated that he was going to call the hospital Administrator, and wanted to know who was over Dr Isidoro Donning, after calling Dr Isidoro Donning a bitch at the Nurses Station and storming off back to his room, patient was notice by Dr Earlene Plater going into his room and stated that I am going to have a Seizure and fall to the floor, all staff went running to the patient room, and patient was lying on the floor in the supine position without any signs of injury, vital signs taken by NT, BP=> 135/68,P=>58, temp =>96.8, sats=>99 % on RA, the question was asked at this time was this patient on safety precautions, yes, but patient refuse all bed and chair alarms, so the statement was made to put the patient in restraints and the patient immediately woke up and clearly stated that restraints were not necessary needed and patient got up off the floor by his own strength with very  Abdulloh Ullom help from staff members, patient was assessed from head to toe and there were no signs of body injury noted, patient in stable condition at this time

## 2011-09-09 NOTE — Consult Note (Addendum)
[facility] CONSULT NOTE     Reason for Consult: migraine    HPI:    Arthur Singleton is an 26 y.o. male with history of TBI S/P craniotomy.  Since his TBI he has been having migraine HA. HE does see a outlying neurologist (Dr. Dalbert Batman -[address] [place], [place], [place] [address] --[phone]) who has prescribed him abortive medication Relpax and another prophylactic medication to which he takes twice a day but cannot remember the name and did not include in his medication history.  He belies it starts with a "p".  His last sever migraine was two months ago and he received 2 mg Dilaudid, phenergan, Toradol.  His migraines are located over the right aspect of his head, they are described a stabbing, pounding, piercing, with phonophobia, photophobia, wavy lines, aura, blurred vision, general body aches.   Past Medical History  Diagnosis Date  . Seizures   . Traumatic brain injury   . Hypertension   . Hyperlipemia   . Bipolar 1 disorder   . Migraine     Past Surgical History  Procedure Date  . Brain surgery   . Right arm growth plate     Family History  Problem Relation Age of Onset  . COPD Mother   . Coronary artery disease Mother   . Stroke Mother   . Diabetes Mother   . Diabetes Father     Social History:  reports that he has been smoking Cigarettes.  He has been smoking about 1.5 packs per day. He has never used smokeless tobacco. He reports that he does not drink alcohol or use illicit drugs.  Allergies  Allergen Reactions  . Iodine Anaphylaxis  . Shellfish Allergy Anaphylaxis    Medications:    Prior to Admission:  Prescriptions prior to admission  Medication Sig Dispense Refill  . clonazePAM (KLONOPIN) 1 MG tablet Take 1 tablet (1 mg total) by mouth 3 (three) times daily as needed. For anxiety  90 tablet  0  . FLUoxetine (PROZAC) 20 MG capsule Take 60 mg by mouth at bedtime. For Depression      . hydrochlorothiazide  (HYDRODIURIL) 25 MG tablet Take 1 tablet (25 mg total) by mouth daily. For control of high blood pressure  30 tablet  0  . lamoTRIgine (LAMICTAL) 150 MG tablet Take 1 tablet (150 mg total) by mouth 2 (two) times daily in the am and at bedtime.. For control of mood and seizures.  60 tablet  0  . levETIRAcetam (KEPPRA) 250 MG tablet Take 1,000 mg by mouth 2 (two) times daily. For control of seizures. 250 every morning and 500 every night      . lovastatin (ALTOPREV) 20 MG 24 hr tablet Take 1 tablet (20 mg total) by mouth daily before breakfast. To help lower cholesterol  30 tablet  0  . mirtazapine (REMERON) 15 MG tablet Take 1 tablet (15 mg total) by mouth at bedtime.  30 tablet  0  . phenytoin (DILANTIN) 100 MG ER capsule Take 100 mg by mouth at bedtime. For control of seizures with Dilantin and Keppra and Lamictal       Scheduled:   . sodium chloride   Intravenous Once  . diphenhydrAMINE  12.5 mg Intravenous TID  . diphenhydrAMINE  25 mg Intravenous Once  . enoxaparin  40 mg Subcutaneous Q24H  . FLUoxetine  60 mg Oral QHS  . gi cocktail  30 mL Oral Once  . lamoTRIgine  150 mg Oral BH-qamhs  . levETIRAcetam  1,000 mg Oral BID  . metoCLOPramide (REGLAN) injection  10 mg Intravenous Once  . metoCLOPramide (REGLAN) injection  10 mg Intravenous Q8H  . mirtazapine  15 mg Oral QHS  . nystatin  5 mL Oral QID  . phenytoin  100 mg Oral QHS  . simvastatin  10 mg Oral q1800  . traMADol  50 mg Oral Q8H  . DISCONTD: levetiracetam  1,000 mg Intravenous Q12H  . DISCONTD: lovastatin  20 mg Oral QAC breakfast    Review of Systems - General ROS: negative for - chills, fatigue, fever or hot flashes Hematological and Lymphatic ROS: negative for - bruising, fatigue, jaundice or pallor Endocrine ROS: negative for - hair pattern changes, hot flashes, mood swings or skin changes Respiratory ROS: negative for - cough, hemoptysis, orthopnea or wheezing Cardiovascular ROS: negative for - dyspnea on exertion,  orthopnea, palpitations or shortness of breath Gastrointestinal ROS: negative for - abdominal pain, appetite loss, blood in stools, diarrhea or hematemesis Musculoskeletal ROS: negative for - joint pain, joint stiffness, joint swelling or muscle pain Neurological ROS: positive for - headaches and seizures Dermatological ROS: negative for dry skin, pruritus and rash   Blood pressure 128/76, pulse 60, temperature 97.9 F (36.6 C), temperature source Oral, resp. rate 18, height 5\' 10"  (1.778 m), weight 115.214 kg (254 lb), SpO2 99.00%.   Neurologic Examination:   Mental Status: Alert, oriented, thought content appropriate.  Speech fluent without evidence of aphasia. Able to follow 3 step commands without difficulty. Cranial Nerves: II-Visual fields grossly intact. III/IV/VI-Extraocular movements intact.  Pupils reactive bilaterally --the right pupil does not respond to direct light but is accommodating when light is shown into left eye. 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  V/VII-Smile symmetric VIII-grossly intact IX/X-normal gag XI-bilateral shoulder shrug XII-midline tongue extension Motor: 5/5 bilaterally with normal tone and bulk Sensory: Pinprick and light touch intact throughout, bilaterally Deep Tendon Reflexes: 2+ and symmetric throughout Plantars: Downgoing bilaterally Cerebellar: Normal finger-to-nose, normal rapid alternating movements and normal heel-to-shin test.  Normal gait and station.   No results found for this basename: cbc, bmp, coags, chol, tri, ldl, hga1c    Results for orders placed during the hospital encounter of 09/07/11 (from the past 48 hour(s))  CBC     Status: Normal   Collection Time   09/08/11  3:18 AM      Component Value Range Comment   WBC 7.4  4.0 - 10.5 (K/uL)    RBC 4.86  4.22 - 5.81 (MIL/uL)    Hemoglobin 15.1  13.0 - 17.0 (g/dL)    HCT 16.1  09.6 - 04.5 (%)    MCV 87.9  78.0 - 100.0 (fL)    MCH 31.1  26.0 - 34.0  (pg)    MCHC 35.4  30.0 - 36.0 (g/dL)    RDW 40.9  81.1 - 91.4 (%)    Platelets 222  150 - 400 (K/uL)   DIFFERENTIAL     Status: Normal   Collection Time   09/08/11  3:18 AM      Component Value Range Comment   Neutrophils Relative 47  43 - 77 (%)    Neutro Abs 3.5  1.7 - 7.7 (K/uL)    Lymphocytes Relative 42  12 - 46 (%)    Lymphs Abs 3.1  0.7 -  4.0 (K/uL)    Monocytes Relative 9  3 - 12 (%)    Monocytes Absolute 0.6  0.1 - 1.0 (K/uL)    Eosinophils Relative 2  0 - 5 (%)    Eosinophils Absolute 0.2  0.0 - 0.7 (K/uL)    Basophils Relative 0  0 - 1 (%)    Basophils Absolute 0.0  0.0 - 0.1 (K/uL)   BASIC METABOLIC PANEL     Status: Abnormal   Collection Time   09/08/11  3:18 AM      Component Value Range Comment   Sodium 140  135 - 145 (mEq/L)    Potassium 3.8  3.5 - 5.1 (mEq/L)    Chloride 104  96 - 112 (mEq/L)    CO2 27  19 - 32 (mEq/L)    Glucose, Bld 105 (*) 70 - 99 (mg/dL)    BUN 14  6 - 23 (mg/dL)    Creatinine, Ser 9.14  0.50 - 1.35 (mg/dL)    Calcium 78.2  8.4 - 10.5 (mg/dL)    GFR calc non Af Amer >90  >90 (mL/min)    GFR calc Af Amer >90  >90 (mL/min)   PHENYTOIN LEVEL, TOTAL     Status: Abnormal   Collection Time   09/08/11  3:18 AM      Component Value Range Comment   Phenytoin Lvl <2.5 (*) 10.0 - 20.0 (ug/mL)   SALICYLATE LEVEL     Status: Abnormal   Collection Time   09/08/11  3:18 AM      Component Value Range Comment   Salicylate Lvl <2.0 (*) 2.8 - 20.0 (mg/dL)   ACETAMINOPHEN LEVEL     Status: Normal   Collection Time   09/08/11  3:18 AM      Component Value Range Comment   Acetaminophen (Tylenol), Serum <15.0  10 - 30 (ug/mL)   URINE RAPID DRUG SCREEN (HOSP PERFORMED)     Status: Abnormal   Collection Time   09/08/11  6:23 AM      Component Value Range Comment   Opiates POSITIVE (*) NONE DETECTED     Cocaine NONE DETECTED  NONE DETECTED     Benzodiazepines POSITIVE (*) NONE DETECTED     Amphetamines NONE DETECTED  NONE DETECTED     Tetrahydrocannabinol  POSITIVE (*) NONE DETECTED     Barbiturates NONE DETECTED  NONE DETECTED    URINALYSIS, ROUTINE W REFLEX MICROSCOPIC     Status: Normal   Collection Time   09/08/11  6:23 AM      Component Value Range Comment   Color, Urine YELLOW  YELLOW     APPearance CLEAR  CLEAR     Specific Gravity, Urine 1.017  1.005 - 1.030     pH 6.0  5.0 - 8.0     Glucose, UA NEGATIVE  NEGATIVE (mg/dL)    Hgb urine dipstick NEGATIVE  NEGATIVE     Bilirubin Urine NEGATIVE  NEGATIVE     Ketones, ur NEGATIVE  NEGATIVE (mg/dL)    Protein, ur NEGATIVE  NEGATIVE (mg/dL)    Urobilinogen, UA 0.2  0.0 - 1.0 (mg/dL)    Nitrite NEGATIVE  NEGATIVE     Leukocytes, UA NEGATIVE  NEGATIVE  MICROSCOPIC NOT DONE ON URINES WITH NEGATIVE PROTEIN, BLOOD, LEUKOCYTES, NITRITE, OR GLUCOSE <1000 mg/dL.  ETHANOL     Status: Normal   Collection Time   09/08/11  3:35 PM      Component Value Range Comment   Alcohol,  Ethyl (B) <11  0 - 11 (mg/dL)   CREATININE, SERUM     Status: Normal   Collection Time   09/08/11  5:30 PM      Component Value Range Comment   Creatinine, Ser 0.72  0.50 - 1.35 (mg/dL)    GFR calc non Af Amer >90  >90 (mL/min)    GFR calc Af Amer >90  >90 (mL/min)   ETHANOL     Status: Normal   Collection Time   09/08/11  5:30 PM      Component Value Range Comment   Alcohol, Ethyl (B) <11  0 - 11 (mg/dL)   CBC     Status: Normal   Collection Time   09/09/11  9:25 AM      Component Value Range Comment   WBC 7.7  4.0 - 10.5 (K/uL)    RBC 4.72  4.22 - 5.81 (MIL/uL)    Hemoglobin 14.5  13.0 - 17.0 (g/dL)    HCT 09.8  11.9 - 14.7 (%)    MCV 89.4  78.0 - 100.0 (fL)    MCH 30.7  26.0 - 34.0 (pg)    MCHC 34.4  30.0 - 36.0 (g/dL)    RDW 82.9  56.2 - 13.0 (%)    Platelets 207  150 - 400 (K/uL)     Ct Head Wo Contrast  09/08/2011  *RADIOLOGY REPORT*  Clinical Data: Migraine headaches.  To possible seizures.  History of right frontal brain surgery post-traumatic injury 14 years ago.  CT HEAD WITHOUT CONTRAST  Technique:   Contiguous axial images were obtained from the base of the skull through the vertex without contrast.  Comparison: 10/29/2009  Findings: Postoperative changes with frontal craniotomy and plate screw fixation.  Focal encephalomalacia in the right anterior frontal lobe without change.  Ventricles and sulci are otherwise symmetrical.  No mass effect or midline shift.  No abnormal extra- axial fluid collections.  Gray-white matter junctions are distinct. Basal cisterns are not effaced.  No ventricular dilatation.  No evidence of acute intracranial hemorrhage.  Nasal bone fractures without soft tissue swelling, likely chronic.  No depressed skull fractures.  Stable appearance since previous study.  IMPRESSION: Postoperative changes with frontal craniotomy and right frontal encephalomalacia.  No evidence of acute intracranial hemorrhage, mass lesion, or acute infarct.  Original Report Authenticated By: Marlon Pel, M.D.     Assessment/Plan:   26 YO male with migraine HA. Patient sees a neurology & pain management clinic in [place] for ongoing treatment. He currently is in [place] for family issues and now suffering from right hemispheric HA.   Recommend: 1)Toradol 30 mg every 8 hours PRN, Reglan 10 mg every 8 hours and Benadryl 12.5 mg TID.   2) 12 lead EKG--if no QTc prolongation start 10 mg Pamelor daily.  3) above is the information of his neurologist--recommend contacting prior to initiating any narcotics.   Please call with any further questions.   I have discussed patient with Dr. Lyman Speller and he has seen the patient and agrees with the above mentioned.    Felicie Morn PA-C [facility] [phone]  09/09/2011, 9:55 AM

## 2011-09-09 NOTE — Progress Notes (Signed)
Arthur Singleton  AVW:[id number]  DOB: Jul 08, 1986  DOA: 09/07/2011  PCP: No primary provider on file.  Subjective and interval history so far: Patient continues to complain about his excruciating headache typical of his migraine, however ambulating in the hallway without any difficulty, no photophobia on turning the room lights, no nausea no vomiting, tolerating food. Initially had refused all his seizure medications this morning. He continued to demand his cocktail of medications for his migraine headache, "because I know it works", he demanded 2 mg of Dilaudid with 25 mg of Benadryl with 12.5 mg of Zofran and 30 mg of Toradol altogether and then repeat the cocktail again if he still has the pain.  I called neurology service for consult ASAP, patient was seen by Dr. Lyman Speller, this morning. At 2:10 PM: Patient became extremely agitated and verbally abusive, calling me names and threatening me.   Objective: Weight change:   Intake/Output Summary (Last 24 hours) at 09/09/11 1352 Last data filed at 09/09/11 0845  Gross per 24 hour  Intake    960 ml  Output      0 ml  Net    960 ml   Blood pressure 128/76, pulse 60, temperature 97.9 F (36.6 C), temperature source Oral, resp. rate 18, height 5\' 10"  (1.778 m), weight 115.214 kg (254 lb), SpO2 99.00%.  Physical Exam: General: Alert and awake, oriented x3, not in any acute distress. Patient extremely agitated and hostile, hence no physical exam done  Lab Results: Basic Metabolic Panel:  Lab 09/09/11 8295 09/08/11 1730 09/08/11 0318  NA 139 -- 140  K 3.8 -- 3.8  CL 106 -- 104  CO2 24 -- 27  GLUCOSE 92 -- 105*  BUN 11 -- 14  CREATININE 0.82 0.72 --  CALCIUM 9.3 -- 10.2  MG -- -- --  PHOS -- -- --   Liver Function Tests:  Lab 09/09/11 0925  AST 14  ALT 23  ALKPHOS 86  BILITOT 0.3  PROT 6.3  ALBUMIN 3.8     Lab 09/09/11 0925 09/08/11 0318  WBC 7.7 7.4  NEUTROABS -- 3.5  HGB 14.5 15.1  HCT 42.2 42.7  MCV 89.4 87.9  PLT  207 222    Studies/Results: Ct Head Wo Contrast  09/08/2011  *RADIOLOGY REPORT*  Clinical Data: Migraine headaches.  To possible seizures.  History of right frontal brain surgery post-traumatic injury 14 years ago.  CT HEAD WITHOUT CONTRAST  Technique:  Contiguous axial images were obtained from the base of the skull through the vertex without contrast.  Comparison: 10/29/2009  Findings: Postoperative changes with frontal craniotomy and plate screw fixation.  Focal encephalomalacia in the right anterior frontal lobe without change.  Ventricles and sulci are otherwise symmetrical.  No mass effect or midline shift.  No abnormal extra- axial fluid collections.  Gray-white matter junctions are distinct. Basal cisterns are not effaced.  No ventricular dilatation.  No evidence of acute intracranial hemorrhage.  Nasal bone fractures without soft tissue swelling, likely chronic.  No depressed skull fractures.  Stable appearance since previous study.  IMPRESSION: Postoperative changes with frontal craniotomy and right frontal encephalomalacia.  No evidence of acute intracranial hemorrhage, mass lesion, or acute infarct.  Original Report Authenticated By: Marlon Pel, M.D.    Medications: Scheduled Meds:   . diphenhydrAMINE  12.5 mg Intravenous TID  . enoxaparin  40 mg Subcutaneous Q24H  . FLUoxetine  60 mg Oral QHS  . gi cocktail  30 mL Oral Once  . lamoTRIgine  150 mg Oral BID  . levETIRAcetam  1,000 mg Oral BID  . metoCLOPramide (REGLAN) injection  10 mg Intravenous Q8H  . mirtazapine  15 mg Oral QHS  . nystatin  5 mL Oral QID  . phenytoin  100 mg Oral TID  . simvastatin  10 mg Oral q1800  . traMADol  50 mg Oral Q8H  . DISCONTD: lamoTRIgine  150 mg Oral BH-qamhs  . DISCONTD: levETIRAcetam  1,000 mg Oral BID  . DISCONTD: lovastatin  20 mg Oral QAC breakfast  . DISCONTD: phenytoin  100 mg Oral QHS   Continuous Infusions:   . sodium chloride       Assessment/Plan: 1. Migraine  headaches:  - Appreciate neurology service, noted all the recommendations. - I also called patient's neurologist, Dr Girtha Hake, [place], [place] [address] [phone]) and spoke to him in detail regarding the patient and his demand for "headache cocktail". Per Dr. Theodoro Grist, he has last seen patient in May 2011, and has never placed him on narcotics for the headaches. - When I explained the above to the patient and above recommendations from [facility] Neurology service, he became agitated and stated that he does not agree with the recommendations from [facility] neurologist who does not know his medical history. - I also called patient's primary care physician, Dr. Lonzo Candy, [place], [place] [phone]) who was unavailable today. But his nurse reported that patient was last seen on Sept. 19th, 2012 and had only filled scripts for Prozac, lovastatin, HCTZ, Klonopin, Dilantin, Keppra, Lamictal and no narcotics. Dr. Kenna Gilbert nurse also reported that patient has been doctor shopping and hospital shopping and has been recently to [facility] on Jan 3rd, 2013, then at [facility] [place]. - [facility] has [facility] in January 2013, was also admitted at behavioral [facility] in January. - Patient's urine drug screen was positive for benzodiazepines, opiates, THC. Patient has not filled up his medication from [facility] since October 2012 (confirmed by the charge nurse).  - Patient was seen ambulating in the hallway without any difficulty or any classical aura symptoms of migraines but continues to demand 2 mg Dilaudid with his 'cocktail'         2. Seizures vs pseudoseizure: Patient initially refused his seizure medications this morning,  - I adjusted his seizure medications (Lamictal, Keppra, Dilantin) according to his home schedule.  - Patient after his threatening behavior, agitation walked to his room and reported to another physician and that he's going to have a  seizure and fell. Patient was witnessed by the staff to be alert and awake through the episode. - I discussed with neurology service and patient was also evaluated by Dr. Lyman Speller. Per their recommendations, I ordered an EEG, Dilantin level and albumin level. Deferred any changes to the seizure medications to neurology service. - Also placed a safety sitter in the room given his ongoing behavioral issues.  3. history of depression:  - Patient was admitted to the [facility] in January 2013 due to depression and suicidal ideation.   - I also consulted psychiatry service for recommendations.   4. history of oral thrush: Continue nystatin  DVT Prophylaxis: Lovenox   Disposition: not medically ready    LOS: 2 days   Keldrick Pomplun M.D. [facility] 09/09/2011, 1:52 PM

## 2011-09-09 NOTE — Progress Notes (Signed)
Patient continually coming up to desk requesting to speak with Dr. Isidoro Donning.  Dr. Isidoro Donning at desk attempting to contact the multiple neurologists that patient says he has seen.  Patient stating it is unethical that he is not having his pain controlled although neurology had recommended he not have narcotics, he has received other medications for his migraine including Toradol, Benadryl, Ativan, and Morphine.  Dr. Isidoro Donning spent considerable time with patient explaining the neurologists recommendations and the discomfort she felt with ordering Dilaudid which patient continually insisted that he receive.  At this time, patient demanded to see the Medical Director.  Did notify Dr. Darnelle Catalan about this patient who stated she would come to see him as soon as she was out of direct patient care.

## 2011-09-09 NOTE — Progress Notes (Signed)
Pt repeatedly Engineer, production c/o pain and stated he was not receiving the proper nursing care he deserved.  NP came to floor to see pt and speak with him about his migraine and medications.  AC also still on floor at this time, so NP, AC, and RN all spoke with pt at once about his concerns.  Admitting MD in ED also notified of situation.  Plan of care was discussed with pt, but he was still agitated and not satisfied.  He asked to be transferred to a trauma center and to speak with the supervisors of the NP and [facility].  Pt fell asleep before MD was able to leave her patients in the ED to come see him.  Will let pt rest at this time and address any further needs when he wakes up.

## 2011-09-09 NOTE — Progress Notes (Signed)
Patient refuse to take morning medication, stated," I take these medications in the morning and why are the doctors changing the way I take my medication," patient refuse all am medication at this time," Dr notified and spoke with patient on the telephone about his medication, could not come and see the patient at this time due to rounds on other patient who are critically ill at this time, would see this patient as mid day, patient in stable condition at this time

## 2011-09-09 NOTE — Progress Notes (Signed)
Patient called up to desk demanding to speak to the supervisor.  Charge nurse went immediately into patients room who stated he needed to speak to the Medical Director about his pain.  I told patient that his primary nurse had just gotten off the phone with his primary doctor who stated she was coming to see him.  We discussed the meds he had already been given to try and control his pain and that the neurologist recommended no narcotics.  He stated that neurologist did not know his history and was in no position to make any decisions about what he should have.  He stated we were not giving him his medications correctly and that he would expect better care from a Level [facility] Trauma Center.  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  Patient repeatedly stated he receives his medications from [facility] so I called them to make sure we were correct on his medications.  Per [facility] [phone] patient has received no medications whatsoever through them since May 17, 2011.  They did verify his list of medications which were all correct when compared to his home medications other than he does receive Dilantin 100 mg 3 times a day.  The pharmacy tech did state patient had some prescriptions e-prescribed by a Dr. Dan Humphreys on the 17th of January 2013 but he never came to pick them up.  Will alert Dr. Isidoro Donning to the above.

## 2011-09-09 NOTE — Progress Notes (Signed)
Patient called up to desk stating he had fallen coming out of the bathroom.  Multiple staff members rushed to room where patient was found lying in floor beside his bed, states he tripped over the ledge coming out of the bathroom and fell.  Vital signs taken, MD notified. Patient denies injury.  As per protocol, falling star was placed at door, patient wearing yellow arm band and red socks.  Explained to patient that to keep him safe from further fall he would need to call us so we could assist him to the bathroom and back or to the chair and back.  Also notified him that we would be setting his bed alarm so that this would alert [place] to his movements in case he forgot to call.  Patient became very upset about this, stated he was an [employer] and knew that bed alarms do not decrease falls.  I told him that [facility] has research to support bed alarms do decrease falls and that was our policy.  He at that time insisted on speaking to the Medical Director about this issue.  I told him that was not the correct chain of command, that the [facility] would come and speak to him, that he could speak to the Director of the floor as well to see if we can resolve this issue.  AC Sarah did come up and speak with patient.  He expressed that it was "beneath him" to have a bed alarm on.  Deal was made with [facility] that if he could remember to call for assistance when he got up then we would leave the call bell off.  It was reiterated to patient multiple times that the use of the call bell was strictly for his safety but he continued to adamantly refuse.

## 2011-09-09 NOTE — Progress Notes (Addendum)
Patient c/o pain migraine and stated," that the medication ordered by Dr Isidoro Donning was not helping his migraine,"  So, Dr was notified that patient continued to c/o of pain and was asking to speak with her (Dr Isidoro Donning), spoke with Charge Nurse Herbert Seta while she  was in patient room (1512), Dr informed Primary Nurse and Charge Nurse that she would come and speak with patient in 15 minutes, Dr arrived to unit in time stated and spoke with Primary Nurse before going into patient's room to speak with him, after arrival to patient's (1512) Dr Isidoro Donning spoke with patient about his medical history and his PCP and asked him about this Neurologist that patient has informed staff and Dr he had been seeing for his migraine, informed patient that she (Dr Isidoro Donning) had recommended one of the hospital Neurologist come to see him and make a recommended, patient stated." that he did not agree with the recommendation from the Neurologist, because he never seem him before and how can he make a decision about my condition in only five minutes after seeing me," Dr Isidoro Donning informed the patient at this time that she had spoken with his Neurologist and she was informed that he last seem this patient was in 2011 and that he never recommended narcotic for his migraine, Dr also informed this patient that the hospital Neurologist did not recommended narcotic for his migraine so she did not feel comfortable given him narcotic against the recommendation of two Neurologist, patient stated," well if you are not going to give me what I know will work for my migraine than I will leave AMA and go to a hospital that will give me what I need to stop this Migraine, Dr informed patient that she was not going to discharge and if he left the hospital it would be his own decision, Patient stated," I am going to contact the Administrator of this hospital and my Lawyer and sue if I am not going to be treated for my medical condition," Patient wanted Dilaudid mg and the Dr informed  patient that this is not what the Neurologist recommended, patient continue to disagree with all the recommendation that Dr Isidoro Donning recommended for his care but patient did not want to comply the recommendations, patient in stable condition at this time, patient very unhappy with this discussion with Dr Isidoro Donning.

## 2011-09-10 MED ORDER — NICOTINE 21 MG/24HR TD PT24
21.0000 mg | MEDICATED_PATCH | Freq: Every day | TRANSDERMAL | Status: DC
  Administered 2011-09-10: 21 mg via TRANSDERMAL
  Filled 2011-09-10 (×2): qty 1
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
## 2011-09-10 NOTE — Progress Notes (Signed)
[facility] PROGRESS NOTE    SUBJECTIVE   Still complaining of mild HA.  No other complaints. Still asking for dilaudid specifically.  I had a long discussion in which he explained to me he receives all his medications from [facility]. He could not tell me the exact MD who prescribed his medication but did name multiple MD's in the past who have given him medications.  Of these included De Hollingshead (his mothers MD), Lonzo Candy who has a  practice is in [place] and Dr. Harriett Sine in [place].    No further seizures  OBJECTIVE   Vital signs in last 24 hours: Temp:  [98.1 F (36.7 C)-99.2 F (37.3 C)] 98.1 F (36.7 C) (02/14 0616) Pulse Rate:  [64-127] 127  (02/14 0616) Resp:  [18-26] 26  (02/14 0616) BP: (119-164)/(64-74) 128/74 mmHg (02/14 0616) SpO2:  [90 %-98 %] 90 % (02/14 0616)  Intake/Output from previous day: 02/13 0701 - 02/14 0700 In: 1582.7 [P.O.:240; I.V.:1334.7; IV Piggyback:8] Out: -  Intake/Output this shift:   Nutritional status: General  Past Medical History  Diagnosis Date  . Seizures   . Traumatic brain injury   . Hypertension   . Hyperlipemia   . Bipolar 1 disorder   . Migraine     Neurologic Exam:  Mental Status: Alert, oriented, thought content appropriate.  Speech fluent without evidence of aphasia.  Able to follow 3 step commands without difficulty. Cranial Nerves: II: visual fields grossly normal, pupils equal, round, reactive to light and accommodation--the right pupil does not respond to direct light but is accommodating when light is shown into left eye. 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  III,IV, VI: ptosis not present, extraocular muscles extra-ocular motions intact bilaterally V,VII: smile symmetric, facial light touch sensation normal bilaterally VIII: hearing normal bilaterally IX,X: gag reflex present XI: trapezius strength/neck flexion strength normal  bilaterally XII: tongue strength normal  Motor: Right : Upper extremity    Left:     Upper extremity 5/5 deltoid       5/5 deltoid 5/5 tricep      5/5 tricep 5/5 biceps      5/5 biceps  5/5wrist flexion     5/5 wrist flexion 5/5 wrist extension     5/5 wrist extension 5/5 hand grip      5/5 hand grip  Lower extremity     Lower extremity 5/5 hip flexor      5/5 hip flexor 5/5 hip adductors     5/5 hip adductors 5/5 hip abductors     5/5 hip abductors 5/5 quadricep      5/5 quadriceps  5/5 hamstrings     5/5 hamstrings 5/5 plantar flexion       5/5 plantar flexion 5/5 plantar extension     5/5 plantar extension Tone and bulk:normal tone throughout; no atrophy noted Sensory: Pinprick and light touch intact throughout, bilaterally, Deep Tendon Reflexes: 2+ and symmetric throughout Plantars: Right: downgoing   Left: downgoing Cerebellar: normal finger-to-nose, normal rapid alternating movements and normal heel-to-shin test normal gait and station   Lab Results: No results found for this basename: cbc, bmp, coags, chol, tri, ldl, hga1c   Lipid Panel No results found for this basename: CHOL,TRIG,HDL,CHOLHDL,VLDL,LDLCALC in  the last 72 hours  Studies/Results: No results found.  Medications:     Scheduled:   . clonazePAM  1 mg Oral TID  . diphenhydrAMINE  12.5 mg Intravenous Q8H  . enoxaparin  40 mg Subcutaneous Q24H  . FLUoxetine  60 mg Oral QHS  . gi cocktail  30 mL Oral Once  . lamoTRIgine  150 mg Oral BID  . levETIRAcetam  1,000 mg Oral BID  . metoCLOPramide (REGLAN) injection  10 mg Intravenous Q8H  . metoCLOPramide (REGLAN) injection  10 mg Intravenous Q8H  . mirtazapine  15 mg Oral QHS  . nicotine  21 mg Transdermal Daily  . nystatin  5 mL Oral QID  . phenytoin  100 mg Oral TID  . simvastatin  10 mg Oral q1800  . SUMAtriptan  6 mg Subcutaneous Once  . traMADol  50 mg Oral Q8H  . DISCONTD: diphenhydrAMINE  12.5 mg Intravenous TID  . DISCONTD: lamoTRIgine  150 mg  Oral BH-qamhs  . DISCONTD: levETIRAcetam  1,000 mg Oral BID  . DISCONTD: phenytoin  100 mg Oral QHS    Assessment/Plan:   26 YO male with migraine HA.   Recommend:  1)Toradol 30 mg every 8 hours PRN, Reglan 10 mg every 8 hours and Benadryl 12.5 mg TID. 2) would avoid narcotic medication   No further recommendations.  Neurology will S/O.  Please call with questions.    Felicie Morn PA-C [facility] [phone]  09/10/2011, 9:48 AM

## 2011-09-10 NOTE — Progress Notes (Signed)
Patient ID: Arthur Singleton, male   DOB: Dec 25, 1985, 26 y.o.   MRN: [id number]  Subjective: No events overnight. Patient denies chest pain, shortness of breath, abdominal pain. Has continuous headache and wants dilaudid.  Objective:  Vital signs in last 24 hours:  There were no vitals filed for this visit.  Intake/Output from previous day:   Intake/Output Summary (Last 24 hours) at 09/10/11 2131 Last data filed at 09/10/11 0600  Gross per 24 hour  Intake 842.67 ml  Output      0 ml  Net 842.67 ml    Physical Exam: General: Alert, awake, oriented x3, in no acute distress. HEENT: No bruits, no goiter. Moist mucous membranes, no scleral icterus, no conjunctival pallor. Heart: Regular rate and rhythm, S1/S2 +, no murmurs, rubs, gallops. Lungs: Clear to auscultation bilaterally. No wheezing, no rhonchi, no rales.  Abdomen: Soft, nontender, nondistended, positive bowel sounds. Extremities: No clubbing or cyanosis, no pitting edema,  positive pedal pulses. Neuro: Grossly nonfocal.  Lab Results:  Basic Metabolic Panel:    Component Value Date/Time   NA 139 09/09/2011 0925   K 3.8 09/09/2011 0925   CL 106 09/09/2011 0925   CO2 24 09/09/2011 0925   BUN 11 09/09/2011 0925   CREATININE 0.82 09/09/2011 0925   GLUCOSE 92 09/09/2011 0925   CALCIUM 9.3 09/09/2011 0925   CBC:    Component Value Date/Time   WBC 7.7 09/09/2011 0925   HGB 14.5 09/09/2011 0925   HCT 42.2 09/09/2011 0925   PLT 207 09/09/2011 0925   MCV 89.4 09/09/2011 0925   NEUTROABS 3.5 09/08/2011 0318   LYMPHSABS 3.1 09/08/2011 0318   MONOABS 0.6 09/08/2011 0318   EOSABS 0.2 09/08/2011 0318   BASOSABS 0.0 09/08/2011 0318      Lab 09/09/11 0925 09/08/11 0318  WBC 7.7 7.4  HGB 14.5 15.1  HCT 42.2 42.7  PLT 207 222  MCV 89.4 87.9  MCH 30.7 31.1  MCHC 34.4 35.4  RDW 13.0 13.2  LYMPHSABS -- 3.1  MONOABS -- 0.6  EOSABS -- 0.2  BASOSABS -- 0.0  BANDABS -- --    Lab 09/09/11 0925 09/08/11 1730 09/08/11 0318  NA  139 -- 140  K 3.8 -- 3.8  CL 106 -- 104  CO2 24 -- 27  GLUCOSE 92 -- 105*  BUN 11 -- 14  CREATININE 0.82 0.72 0.76  CALCIUM 9.3 -- 10.2  MG -- -- --   No results found for this basename: INR:5,PROTIME:5 in the last 168 hours Cardiac markers: No results found for this basename: CK:3,CKMB:3,TROPONINI:3,MYOGLOBIN:3 in the last 168 hours No components found with this basename: POCBNP:3 No results found for this or any previous visit (from the past 240 hour(s)).  Studies/Results: No results found.  Medications: Scheduled Meds:   Continuous Infusions:   PRN Meds:.    Assessment/Plan:  1. Migraine headaches:  - persistent and pt requesting dilaudid 2. Seizures vs pseudoseizure:  - follow upon neurology consult 3. history of depression:  - Patient was admitted to the [facility] in January 2013 due to depression and suicidal ideation.  - psych consult pending 4. history of oral thrush: Continue nystatin   EDUCATION - test results and diagnostic studies were discussed with patient  - patient verbalized the understanding - questions were answered at the bedside and contact information was provided for additional questions or concerns   LOS: 0 days   MAGICK-Hasson Gaspard 09/10/2011, 9:31 PM  [facility] Pager: [phone]

## 2011-09-10 NOTE — Progress Notes (Signed)
Patient reported to the RN that he was going to turn his IV fluids to 999.  RN told the patient that nursing staff are to be the only ones programming the IV pump.  RN locked the back of the pump.  Patient was told that him intervening with medical devices could not only harm his health, but may also have other affects in his care (such as a sitter or restraints if he was considered a harm to himself).  Patient agreed to leave the IV pump alone for now.

## 2011-09-10 NOTE — Progress Notes (Signed)
Patient request that I call MD to change his pain medication from Morphine to Dilaudid. I called Dr Janee Morn and Dr Janee Morn denied the patients request. The patient then asked to speak with [facility]. Clista Bernhardt spoke with patient about his concerns and the patient asked to leave AMA. I explained the consequences of leaving AMA and the patient stated he understood. He signed the [facility] papers and was escorted out by security.

## 2011-09-11 ENCOUNTER — Emergency Department (HOSPITAL_BASED_OUTPATIENT_CLINIC_OR_DEPARTMENT_OTHER)
Admission: EM | Admit: 2011-09-11 | Discharge: 2011-09-11 | Disposition: A | Discharge status: Home / Self Care | Payer: Medicare Other | Attending: Emergency Medicine | Admitting: Emergency Medicine

## 2011-09-11 ENCOUNTER — Encounter (HOSPITAL_BASED_OUTPATIENT_CLINIC_OR_DEPARTMENT_OTHER): Payer: Self-pay | Admitting: *Deleted

## 2011-09-11 DIAGNOSIS — Z79899 Other long term (current) drug therapy: Secondary | ICD-10-CM | POA: Insufficient documentation

## 2011-09-11 DIAGNOSIS — R569 Unspecified convulsions: Secondary | ICD-10-CM | POA: Insufficient documentation

## 2011-09-11 DIAGNOSIS — R51 Headache: Secondary | ICD-10-CM | POA: Insufficient documentation

## 2011-09-11 HISTORY — DX: Unspecified intracranial injury with loss of consciousness status unknown, initial encounter: S06.9XAA

## 2011-09-11 HISTORY — DX: Unspecified intracranial injury with loss of consciousness of unspecified duration, initial encounter: S06.9X9A

## 2011-09-11 LAB — BASIC METABOLIC PANEL
Calcium: 9.7 mg/dL (ref 8.4–10.5)
Creatinine, Ser: 0.8 mg/dL (ref 0.50–1.35)
GFR calc Af Amer: >90 mL/min (ref >90)
Sodium: 142 mEq/L (ref 135–145)

## 2011-09-11 LAB — DIFFERENTIAL
Basophils Absolute: 0 10*3/uL (ref 0.0–0.1)
Basophils Relative: 0 % (ref 0–1)
Eosinophils Relative: 2 % (ref 0–5)
Lymphocytes Relative: 39 % (ref 12–46)
Monocytes Absolute: 0.6 10*3/uL (ref 0.1–1.0)
Neutro Abs: 3.6 10*3/uL (ref 1.7–7.7)

## 2011-09-11 LAB — CBC
HCT: 40.1 % (ref 39.0–52.0)
MCHC: 35.2 g/dL (ref 30.0–36.0)
MCV: 87.2 fL (ref 78.0–100.0)
Platelets: 193 10*3/uL (ref 150–400)
RDW: 12.7 % (ref 11.5–15.5)
WBC: 7 10*3/uL (ref 4.0–10.5)

## 2011-09-11 LAB — PHENYTOIN LEVEL, TOTAL: Phenytoin Lvl: 7.6 ug/mL — ABNORMAL LOW (ref 10.0–20.0)

## 2011-09-11 MED ORDER — PROMETHAZINE HCL 25 MG/ML IJ SOLN
12.5000 mg | Freq: Four times a day (QID) | INTRAMUSCULAR | Status: DC | PRN
  Filled 2011-09-11: qty 1

## 2011-09-11 MED ORDER — PHENYTOIN SODIUM EXTENDED 100 MG PO CAPS
300.0000 mg | ORAL_CAPSULE | Freq: Once | ORAL | Status: AC
  Administered 2011-09-11: 300 mg via ORAL
  Filled 2011-09-11: qty 3

## 2011-09-11 MED ORDER — SODIUM CHLORIDE 0.9 % IV BOLUS (SEPSIS)
1000.0000 mL | Freq: Once | INTRAVENOUS | Status: AC
  Administered 2011-09-11: 1000 mL via INTRAVENOUS

## 2011-09-11 MED ORDER — DIPHENHYDRAMINE HCL 50 MG/ML IJ SOLN
25.0000 mg | Freq: Once | INTRAMUSCULAR | Status: AC
  Administered 2011-09-11: 25 mg via INTRAVENOUS
  Filled 2011-09-11: qty 1

## 2011-09-11 MED ORDER — KETOROLAC TROMETHAMINE 30 MG/ML IJ SOLN
30.0000 mg | Freq: Once | INTRAMUSCULAR | Status: AC
  Administered 2011-09-11: 30 mg via INTRAVENOUS
  Filled 2011-09-11: qty 1

## 2011-09-11 NOTE — ED Provider Notes (Signed)
History     CSN: [id number]  Arrival date & time 09/11/11  0101   First MD Initiated Contact with Patient 09/11/11 0140      Chief Complaint  Patient presents with  . Seizures    HPI The patient presents with headache, concerns of her possible recent seizure.  He notes a history of epilepsy and migraines.  He notes that he has been taking his medications as directed, and has been in his usual state of health.  He notes that today, in the hours prior to arrival he had 3, possibly for seizures.  He notes that during this period he has developed a characteristically typical headache.  The headache began on his left side and radiated to the right, is throbbing, not improved with OTC medications.  He also complains of photophobia.  He denies any nausea, ataxia, disorientation, discoordination.  He reiterates that this is "the same" headache as he has had multiple prior times. Notably, EMS reports that the patient's story changed several times, and that he was acting idiosyncratically prior to being loaded into the ambulance, including offering different quantities of seizures, asking for a ride to [place], and not demonstrating any postictal behavior following his most recent seizure. The patient asks for Toradol, Benadryl, Phenergan, Dilaudid to control his headache. He notes that he is allergic to "idiots." Past Medical History  Diagnosis Date  . Seizures   . Brain injury     No past surgical history on file.  No family history on file.  History  Substance Use Topics  . Smoking status: Not on file  . Smokeless tobacco: Not on file  . Alcohol Use:       Review of Systems  Constitutional:       Per HPI, otherwise negative  HENT:       Per HPI, otherwise negative  Eyes: Negative.   Respiratory:       Per HPI, otherwise negative  Cardiovascular:       Per HPI, otherwise negative  Gastrointestinal: Negative for vomiting.  Genitourinary: Negative.   Musculoskeletal:     Per HPI, otherwise negative  Skin: Negative.   Neurological: Negative for syncope.    Allergies  Iodine  Home Medications   Current Outpatient Rx  Name Route Sig Dispense Refill  . CLONAZEPAM 1 MG PO TABS Oral Take 1 mg by mouth 3 (three) times daily as needed.    Marland Kitchen FLUOXETINE HCL 40 MG PO CAPS Oral Take 40 mg by mouth daily.    Marland Kitchen LAMOTRIGINE 150 MG PO TABS Oral Take 150 mg by mouth daily.    Marland Kitchen LEVETIRACETAM 1000 MG PO TABS Oral Take 1,000 mg by mouth 2 (two) times daily.    Marland Kitchen MIRTAZAPINE 15 MG PO TABS Oral Take 15 mg by mouth at bedtime.    Marland Kitchen PHENYTOIN SODIUM EXTENDED 300 MG PO CAPS Oral Take 300 mg by mouth 3 (three) times daily.      BP 135/72  Pulse 66  Temp(Src) 97.3 F (36.3 C) (Oral)  Resp 18  Wt 260 lb (117.935 kg)  SpO2 96%  Physical Exam  Nursing note and vitals reviewed. Constitutional: He is oriented to person, place, and time. He appears well-developed. No distress.       Unkempt. Patient awakened to conduct initial interview the  HENT:  Head: Normocephalic and atraumatic.  Eyes: Conjunctivae and EOM are normal.  Cardiovascular: Normal rate and regular rhythm.   Pulmonary/Chest: Effort normal. No stridor. No respiratory distress.  Abdominal: He exhibits no distension.  Musculoskeletal: He exhibits no edema.  Neurological: He is alert and oriented to person, place, and time.  Skin: Skin is warm and dry.  Psychiatric: His speech is normal and behavior is normal. Judgment normal. His affect is angry. Cognition and memory are normal. He expresses no suicidal ideation.    ED Course  Procedures (including critical care time)   Labs Reviewed  CBC  DIFFERENTIAL  BASIC METABOLIC PANEL  PHENYTOIN LEVEL, TOTAL   No results found.   No diagnosis found.  3:06 AM  Patient snoring   MDM  This young male who has a history of seizures, migraines presents following possible seizure activity with a headache.  On exam he is in no distress, with no notable  findings area following fluids, Benadryl, Toradol the patient was sleeping, seemingly comfortably.  The patient's Dilantin level was subtherapeutic.  He was provided an appropriate dose.  The patient was discharged in stable condition to follow up with his physician.        Gerhard Munch, MD 09/11/11 [phone]

## 2011-09-11 NOTE — Procedures (Signed)
EEG ID:  [id number].  HISTORY:  This is a 26 years old man with history of traumatic brain injury per report.  MEDICATIONS:  Keppra, Lamictal, Klonopin, Dilantin, Ativan as per the patient.  CONDITION OF RECORDING:  This 16-lead EEG was recorded with the patient in awake, drowsy, and sleep states.  Background rhythms: background patterns in wakefulness were well organized with a well sustained posterior dominant of 10.5 Hz, symmetrical and reactive to opening and closing.  Drowsiness was associated with mild attenuation in voltage and slowing frequencies.  Normal sleep patterns were seen.  Abnormal Potentials: no epileptiform activity or focal slowing was noted.  ACTIVATION PROCEDURES:  Hyperventilation was not performed.  Photic stimulation was not performed.  EKG:  Single-channel of EKG monitoring was unremarkable.  IMPRESSION:  This is a normal awake, drowsy, and sleep EEG.  A normal EEG does not rule out the clinical diagnosis of epilepsy. If clinically warranted, a repeat extended EEG or ambulatory requiring may be obtained for prolonged recording times, which may increase diagnostic yield. Clinical correlation is suggested.          ______________________________ Carmell Austria, MD    [id number] D:  09/10/2011 07:20:14  T:  09/10/2011 21:26:15  Job #:  [id number]
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
## 2011-09-11 NOTE — ED Notes (Signed)
Pt states that he had three seizures prior to going to the store states that he has a HA that would make "Jesus curse the devil get saved and God to sin" states that his seizures have been out of control. Pt states that he needs to be admitted and that he needs the migraine cocktail and 2 of dilaudid. States that he hates to ask for the medication and that his head has hurt like this for 40 hours now and that he has now had 4 seizures today

## 2011-09-11 NOTE — ED Notes (Signed)
Per EMS pt was at a local gas station and asked the clerk if he called an ambulance would they take him to [facility] then left shortly after pt came back into store and stated that he had 3 seizures pt then laid in the floor and statesd that he had another seizure and asked for EMS to be called. Pt is alert and oriented does not appear to be postictal

## 2011-09-30 ENCOUNTER — Encounter (HOSPITAL_COMMUNITY): Payer: Self-pay

## 2011-10-11 NOTE — Discharge Summary (Signed)
Patient ID: Kordell Jafri MRN: [id number] DOB/AGE: [birth year] 26 y.o.  Admit date: 09/07/2011 Discharge date: 09/10/11  PATIENT LEFT AGAINST MEDICAL ADVICE  Primary Care Physician:  No primary provider on file.  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 For anxiety      FLUoxetine 20 MG capsule   Commonly known as: PROZAC   Take 60 mg by mouth at bedtime. For Depression      hydrochlorothiazide 25 MG tablet   Commonly known as: HYDRODIURIL   Take 1 tablet (25 mg total) by mouth daily. For control of high blood pressure      lamoTRIgine 150 MG tablet   Commonly known as: LAMICTAL   Take 1 tablet (150 mg total) by mouth 2 (two) times daily in the am and at bedtime.. For control of mood and seizures.      levETIRAcetam 250 MG tablet   Commonly known as: KEPPRA   Take 1,000 mg by mouth 2 (two) times daily. For control of seizures. 250 every morning and 500 every night      lovastatin 20 MG 24 hr tablet   Commonly known as: ALTOPREV   Take 1 tablet (20 mg total) by mouth daily before breakfast. To help lower cholesterol      mirtazapine 15 MG tablet   Commonly known as: REMERON   Take 1 tablet (15 mg total) by mouth at bedtime.      nystatin 100000 UNIT/ML suspension   Commonly known as: MYCOSTATIN   Take 5 mLs (500,000 Units total) by mouth 4 (four) times daily.      phenytoin 100 MG ER capsule   Commonly known as: DILANTIN   Take 100 mg by mouth at bedtime. For control of seizures with Dilantin and Keppra and Lamictal            Disposition and Follow-up: Pt left against medical advice  Consults:  neurology  Significant Diagnostic Studies:  No results  found.  Brief H and P: 26 year old male with h/o brain ijury , seizures came in for altered mental status. Pt is a poor historian, as he dozes in the middle of a sentence. He reports headache. No other complaints. As per the ED RN, pt walked to the bathroom by himself.   Physical Exam on Discharge:  Filed Vitals:   09/09/11 0746 09/09/11 1407 09/09/11 2223 09/10/11 0616  BP: 128/76 164/64 119/72 128/74  Pulse: 60 64 67 127  Temp: 97.9 F (36.6 C) 99.2 F (37.3 C) 98.7 F (37.1 C) 98.1 F (36.7 C)  TempSrc: Oral Oral Oral Oral  Resp: 18 18 18 26   Height:      Weight:      SpO2: 99% 98% 94% 90%    No intake or output data in the 24 hours ending 10/11/11 1647 Exam done earlier in the day as per previous progress note.  General: Alert, awake, oriented x3, in no acute distress. HEENT: No bruits, no goiter. Heart: Regular rate and rhythm, without murmurs, rubs, gallops. Lungs: Clear to auscultation bilaterally. Abdomen: Soft, nontender, nondistended, positive bowel sounds. Extremities: No clubbing cyanosis or  edema with positive pedal pulses. Neuro: Grossly intact, nonfocal.  CBC:    Component Value Date/Time   WBC 7.0 09/11/2011 0215   HGB 14.1 09/11/2011 0215   HCT 40.1 09/11/2011 0215   PLT 193 09/11/2011 0215   MCV 87.2 09/11/2011 0215   NEUTROABS 3.6 09/11/2011 0215   LYMPHSABS 2.8 09/11/2011 0215   MONOABS 0.6 09/11/2011 0215   EOSABS 0.1 09/11/2011 0215   BASOSABS 0.0 09/11/2011 0215    Basic Metabolic Panel:    Component Value Date/Time   NA 142 09/11/2011 0215   K 3.4* 09/11/2011 0215   CL 105 09/11/2011 0215   CO2 27 09/11/2011 0215   BUN 10 09/11/2011 0215   CREATININE 0.80 09/11/2011 0215   GLUCOSE 93 09/11/2011 0215   CALCIUM 9.7 09/11/2011 0215    Hospital Course:   Migraine headaches - neurology consulted and did not think any neurological problem contributing to the headaches and the most likely explanation is secondary to analgesics that pt seems to be  dependent on - pt has been given analgesia but has persistently reported he needs Dilaudid and Toradol in addition to tramadol and phenergan - neurology and primary team did not think that was the appropriate treatment for headaches as there is high suspicion for drug seeking behavior - pt left AMA  Time spent on Discharge:Over 30 minutes  Signed: Debbora Presto 10/11/2011, 4:47 PM  [facility], pager #: [phone] Main office number: [phone]

## 2011-12-27 LAB — PHOSPHORUS: Phosphorus: 3 MG/DL (ref 2.5–4.9)

## 2011-12-27 LAB — METABOLIC PANEL, COMPREHENSIVE
A-G Ratio: 1.2 (ref 1.1–2.2)
ALT (SGPT): 56 U/L (ref 12–78)
AST (SGOT): 24 U/L (ref 15–37)
Albumin: 4.4 g/dL (ref 3.5–5.0)
Alk. phosphatase: 119 U/L (ref 50–136)
Anion gap: 11 mmol/L (ref 5–15)
BUN/Creatinine ratio: 19 (ref 12–20)
BUN: 13 MG/DL (ref 6–20)
Bilirubin, total: 0.8 MG/DL (ref 0.2–1.0)
CO2: 23 MMOL/L (ref 21–32)
Calcium: 9 MG/DL (ref 8.5–10.1)
Chloride: 106 MMOL/L (ref 97–108)
Creatinine: 0.68 MG/DL (ref 0.45–1.15)
GFR est AA: >60 mL/min/{1.73_m2} (ref >60)
GFR est non-AA: >60 mL/min/{1.73_m2} (ref >60)
Globulin: 3.6 g/dL (ref 2.0–4.0)
Glucose: 84 MG/DL (ref 65–100)
Potassium: 3.6 MMOL/L (ref 3.5–5.1)
Protein, total: 8 g/dL (ref 6.4–8.2)
Sodium: 140 MMOL/L (ref 136–145)

## 2011-12-27 LAB — CBC WITH AUTOMATED DIFF
ABS. BASOPHILS: 0.1 10*3/uL (ref 0.0–0.1)
ABS. EOSINOPHILS: 0.3 10*3/uL (ref 0.0–0.4)
ABS. LYMPHOCYTES: 3.5 10*3/uL (ref 0.8–3.5)
ABS. MONOCYTES: 1 10*3/uL (ref 0.0–1.0)
ABS. NEUTROPHILS: 6.1 10*3/uL (ref 1.8–8.0)
BASOPHILS: 1 % (ref 0–1)
EOSINOPHILS: 3 % (ref 0–7)
HCT: 43 % (ref 36.6–50.3)
HGB: 14.8 g/dL (ref 12.1–17.0)
LYMPHOCYTES: 32 % (ref 12–49)
MCH: 30.8 PG (ref 26.0–34.0)
MCHC: 34.4 g/dL (ref 30.0–36.5)
MCV: 89.6 FL (ref 80.0–99.0)
MONOCYTES: 9 % (ref 5–13)
NEUTROPHILS: 55 % (ref 32–75)
PLATELET: 231 10*3/uL (ref 150–400)
RBC: 4.8 M/uL (ref 4.10–5.70)
RDW: 13.2 % (ref 11.5–14.5)
WBC: 10.8 10*3/uL (ref 4.1–11.1)

## 2011-12-27 LAB — PHENYTOIN: Phenytoin: 13.8 ug/mL (ref 10.0–20.0)

## 2011-12-27 LAB — MAGNESIUM: Magnesium: 1.8 MG/DL (ref 1.6–2.4)

## 2011-12-27 MED ADMIN — sodium chloride 0.9 % bolus infusion 1,000 mL: INTRAVENOUS | @ 11:00:00 | NDC 00409798348

## 2011-12-27 MED ADMIN — LORazepam (ATIVAN) injection 1 mg: INTRAVENOUS | @ 10:00:00 | NDC 00641604401

## 2011-12-27 MED FILL — LORAZEPAM 2 MG/ML IJ SOLN: 2 mg/mL | INTRAMUSCULAR | Qty: 1

## 2011-12-27 MED FILL — SODIUM CHLORIDE 0.9 % IV: INTRAVENOUS | Qty: 1000

## 2011-12-27 NOTE — ED Notes (Signed)
Discharge instructions given to pt.  All questions answered and pt verbalized understanding.   V/S stable @ time of discharge.  Pt ambulatory out of unit.

## 2011-12-27 NOTE — ED Notes (Signed)
Bedside shift change report given to [employer] (oncoming nurse) by Britt Boozer, RN (offgoing nurse).  Report given with SBAR, ED Summary, Procedure Summary, Intake/Output and MAR.
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
## 2011-12-27 NOTE — ED Notes (Signed)
[facility] assessing patient patient.

## 2011-12-27 NOTE — ED Notes (Signed)
Bedside shift change report given to Jenn,RN (Cabin crew) by Farley Ly (offgoing nurse).  Report given with SBAR, ED Summary, Procedure Summary and Intake/Output.

## 2011-12-27 NOTE — ED Notes (Signed)
[employer] at bedside.

## 2011-12-27 NOTE — ED Notes (Addendum)
TRIAGE NOTE: Pt arrives via EMS.  Pt reports 4 seizures in the last 48 hours.  Pt reports most recent was approximatly 1.5 hours ago.  Pt called EMS because he had an aura and was worried he would have another seizure.  Pt arrives alert and oriented and reports taking prescribed meds as ordered.  Pt reports headache and back pain.  Denies N/V/D, fever, cough, congestion.

## 2011-12-29 LAB — LEVETIRACETAM (KEPPRA): Levetiracetam (Keppra): NOT DETECTED ug/mL (ref 5.0–63.0)

## 2011-12-30 ENCOUNTER — Inpatient Hospital Stay
Admit: 2011-12-30 | Discharge: 2012-01-01 | Disposition: A | Discharge status: Home / Self Care | Payer: MEDICARE | Source: Other Acute Inpatient Hospital | Attending: Psychiatry | Admitting: Psychiatry

## 2011-12-30 DIAGNOSIS — F4321 Adjustment disorder with depressed mood: Secondary | ICD-10-CM

## 2011-12-30 LAB — METABOLIC PANEL, COMPREHENSIVE
A-G Ratio: 1.4 (ref 1.1–2.2)
ALT (SGPT): 44 U/L (ref 12–78)
AST (SGOT): 18 U/L (ref 15–37)
Albumin: 4.4 g/dL (ref 3.5–5.0)
Alk. phosphatase: 128 U/L (ref 50–136)
Anion gap: 12 mmol/L (ref 5–15)
BUN/Creatinine ratio: 19 (ref 12–20)
BUN: 13 MG/DL (ref 6–20)
Bilirubin, total: 0.5 MG/DL (ref 0.2–1.0)
CO2: 24 MMOL/L (ref 21–32)
Calcium: 8.7 MG/DL (ref 8.5–10.1)
Chloride: 107 MMOL/L (ref 97–108)
Creatinine: 0.69 MG/DL (ref 0.45–1.15)
GFR est AA: >60 mL/min/{1.73_m2} (ref >60)
GFR est non-AA: >60 mL/min/{1.73_m2} (ref >60)
Globulin: 3.1 g/dL (ref 2.0–4.0)
Glucose: 92 MG/DL (ref 65–100)
Potassium: 3.7 MMOL/L (ref 3.5–5.1)
Protein, total: 7.5 g/dL (ref 6.4–8.2)
Sodium: 143 MMOL/L (ref 136–145)

## 2011-12-30 LAB — ETHYL ALCOHOL: ALCOHOL(ETHYL),SERUM: <10 MG/DL (ref <10)

## 2011-12-30 LAB — CBC WITH AUTOMATED DIFF
ABS. BASOPHILS: 0 10*3/uL (ref 0.0–0.1)
ABS. EOSINOPHILS: 0 10*3/uL (ref 0.0–0.4)
ABS. LYMPHOCYTES: 2 10*3/uL (ref 0.8–3.5)
ABS. MONOCYTES: 0.5 10*3/uL (ref 0.0–1.0)
ABS. NEUTROPHILS: 5.1 10*3/uL (ref 1.8–8.0)
BASOPHILS: 0 % (ref 0–1)
EOSINOPHILS: 0 % (ref 0–7)
HCT: 40.7 % (ref 36.6–50.3)
HGB: 14 g/dL (ref 12.1–17.0)
LYMPHOCYTES: 26 % (ref 12–49)
MCH: 31 PG (ref 26.0–34.0)
MCHC: 34.4 g/dL (ref 30.0–36.5)
MCV: 90 FL (ref 80.0–99.0)
MONOCYTES: 6 % (ref 5–13)
NEUTROPHILS: 68 % (ref 32–75)
PLATELET: 231 10*3/uL (ref 150–400)
RBC: 4.52 M/uL (ref 4.10–5.70)
RDW: 13.1 % (ref 11.5–14.5)
WBC: 7.6 10*3/uL (ref 4.1–11.1)

## 2011-12-30 LAB — ACETAMINOPHEN: Acetaminophen level: <2 ug/mL — ABNORMAL LOW (ref 10–30)

## 2011-12-30 LAB — DRUG SCREEN, URINE
AMPHETAMINES: NEGATIVE
BARBITURATES: POSITIVE — AB
BENZODIAZEPINES: NEGATIVE
COCAINE: NEGATIVE
METHADONE: NEGATIVE
OPIATES: NEGATIVE
PCP(PHENCYCLIDINE): NEGATIVE
THC (TH-CANNABINOL): POSITIVE — AB

## 2011-12-30 LAB — PHENYTOIN: Phenytoin: 3.9 ug/mL — ABNORMAL LOW (ref 10.0–20.0)

## 2011-12-30 LAB — SALICYLATE: Salicylate level: 5 MG/DL (ref 2.8–20.0)

## 2011-12-30 MED ORDER — BUTALBITAL-ACETAMINOPHEN-CAFFEINE 50 MG-325 MG-40 MG TAB
50-325-40 mg | ORAL | Status: AC
Start: 2011-12-30 — End: 2011-12-30
  Administered 2011-12-30: 14:00:00 via ORAL

## 2011-12-30 MED FILL — BUTALBITAL-ACETAMINOPHEN-CAFFEINE 50 MG-325 MG-40 MG TAB: 50-325-40 mg | ORAL | Qty: 2

## 2011-12-30 MED FILL — PEPPERMINT OIL: Qty: 25

## 2011-12-30 NOTE — ED Notes (Signed)
Medical transport called for transport to [facility]. ETA 1350. Pt informed.

## 2011-12-30 NOTE — ED Notes (Signed)
Pt c/o migraine and "serious depression". Pt reports several stressful events over past month. Pt reports history of attempted suicide. Pt states "I just wanted to take all my medications that's what i did last night" Pt reports. Pt reports unsuccessful communication with [facility] in the past. Dr. Kathreen Cosier at bedside.

## 2011-12-30 NOTE — ED Notes (Signed)
Pt remains on 1:1 with ED staff.

## 2011-12-30 NOTE — Behavioral Health Treatment Team (Cosign Needed)
The patient [name] is participating in Relaxation Group.    Group time: 30 minutes    Personal goal for participation: Personal    Goal orientation: To learn to relax    Group therapy participation: Active    Therapeutic interventions reviewed and discussed: Yes    ETHEL BARNETT-JOHNSON  12/30/2011

## 2011-12-30 NOTE — ED Notes (Signed)
PCT  At bedside attempting to place SL and obtain blood for lab.

## 2011-12-30 NOTE — ED Notes (Signed)
Report given to Emily C. RN

## 2011-12-30 NOTE — ED Notes (Signed)
Transport arrived. Pt A&OX3. Respirations even and unlabored. IV removed. Pt without c/o pain, discomfort. Pt cooperative upon DC from ED. PT DC with large white bag of belongings.

## 2011-12-30 NOTE — ED Notes (Signed)
Pt arrived to room from bathroom. Pt changed into gown. Placed on monitor X2.
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
## 2011-12-30 NOTE — ED Notes (Signed)
Cardiac diet ordered from dietary per verbal order from Dr. Kathreen Cosier. Pt to be transferred to [facility].

## 2011-12-30 NOTE — ED Notes (Signed)
Pt removed from 1:1 by [facility] and Dr. Kathreen Cosier. Will continue to monitor.
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
## 2011-12-30 NOTE — ED Notes (Signed)
Tray delivered from dietary. Pt eating.

## 2011-12-30 NOTE — ED Notes (Addendum)
Pt placed on 1:1 with ED staff. Call bell and monitor leads removed from room. Pt shirt and pants, wallet placed in bag and placed at nurses station. Charge nurse made aware.

## 2011-12-30 NOTE — Behavioral Health Treatment Team (Cosign Needed)
GROUP THERAPY PROGRESS NOTE    The patient [name] is participating in Reflection Group.    Group time: 30 minutes    Personal goal for participation: Personal    Goal orientation: Reflection    Group therapy participation: Active    Therapeutic interventions reviewed and discussed: Yes    ETHEL BARNETT-JOHNSON  12/30/2011
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
## 2011-12-30 NOTE — ED Notes (Signed)
Pt resting quietly, pt remains within sight of nurses station with door and curtain open. Pt remains cooperative.

## 2011-12-30 NOTE — ED Notes (Signed)
Report called to Darrol Angel, RN at [facility].

## 2011-12-30 NOTE — ED Notes (Signed)
[employer] at bedside.

## 2011-12-31 MED ADMIN — levETIRAcetam (KEPPRA) tablet 500 mg: ORAL | @ 04:00:00 | NDC 64376013799

## 2011-12-31 MED ADMIN — mirtazapine (REMERON) tablet 22.5 mg: ORAL | @ 21:00:00 | NDC 68084011911

## 2011-12-31 MED ADMIN — levETIRAcetam (KEPPRA) tablet 500 mg: ORAL | @ 21:00:00 | NDC 64376013799

## 2011-12-31 MED ADMIN — phenytoin (DILANTIN) tablet 100 mg: ORAL | @ 12:00:00 | NDC 00071000740

## 2011-12-31 MED ADMIN — phenytoin (DILANTIN) tablet 100 mg: ORAL | @ 04:00:00 | NDC 00071000740

## 2011-12-31 MED ADMIN — levETIRAcetam (KEPPRA) tablet 500 mg: ORAL | @ 12:00:00 | NDC 64376013799

## 2011-12-31 MED ADMIN — hydrochlorothiazide (HYDRODIURIL) tablet 25 mg: ORAL | @ 12:00:00 | NDC 68084008611

## 2011-12-31 MED ADMIN — phenytoin (DILANTIN) tablet 100 mg: ORAL | @ 16:00:00 | NDC 00071000740

## 2011-12-31 MED ADMIN — lamoTRIgine (LAMICTAL) tablet 150 mg: ORAL | @ 21:00:00 | NDC 68084031911

## 2011-12-31 MED ADMIN — lamoTRIgine (LAMICTAL) tablet 150 mg: ORAL | @ 12:00:00 | NDC 68084031911

## 2011-12-31 MED ADMIN — lamoTRIgine (LAMICTAL) tablet 150 mg: ORAL | @ 04:00:00 | NDC 68084031911

## 2011-12-31 MED ADMIN — mirtazapine (REMERON) tablet 22.5 mg: ORAL | @ 16:00:00 | NDC 68084011911

## 2011-12-31 MED ADMIN — phenytoin (DILANTIN) tablet 100 mg: ORAL | @ 21:00:00 | NDC 00071000740

## 2011-12-31 MED FILL — LEVETIRACETAM 500 MG TAB: 500 mg | ORAL | Qty: 1

## 2011-12-31 MED FILL — LAMICTAL 100 MG TABLET: 100 mg | ORAL | Qty: 1

## 2011-12-31 MED FILL — HYDROCHLOROTHIAZIDE 25 MG TAB: 25 mg | ORAL | Qty: 1

## 2011-12-31 MED FILL — DILANTIN INFATABS 50 MG CHEWABLE TABLET: 50 mg | ORAL | Qty: 2

## 2011-12-31 MED FILL — ZOLPIDEM 5 MG TAB: 5 mg | ORAL | Qty: 2

## 2011-12-31 MED FILL — MIRTAZAPINE 15 MG TAB: 15 mg | ORAL | Qty: 2

## 2011-12-31 MED FILL — LORAZEPAM 1 MG TAB: 1 mg | ORAL | Qty: 2

## 2011-12-31 NOTE — Behavioral Health Treatment Team (Cosign Needed)
GROUP THERAPY PROGRESS NOTE    The patient [name] a 26 y.o. male is participating in Coping Skills Group.     Group time: 45 minutes    Personal goal for participation: To participate in relaxation activity    Goal orientation:  relaxation    Group therapy participation: active    Therapeutic interventions reviewed and discussed: yes    Impression of participation:  The patient was attentive.    BEVERLY S BAKER  12/31/2011  11:43 AM

## 2011-12-31 NOTE — Behavioral Health Treatment Team (Signed)
Social Work     The pt is a 26 year old male here on a voluntary status due to suicidal ideations. Pt reports that family stressors have become overwhelming to include his father's recent surgery and death of his grandmother. Pt was hospitalized last year in March due to similar symptoms. The pt has a history of polysubstance dependence. Pt reported his last use of  marijuana was last month.     Pt is single with no children. Pt lives in [place] but he has been in [place] since last month due to his father's surgery. Pt reported that his family is his support system. Pt is alert and oriented. Pt denies SI/HI. Pt denies auditory hallucinations. Pt's mood was euthymic. Pt's thoughts were clear. Pt's insight and judgement were fair. Pt reported that he will be in town for at least another month. Pt will be referred to [facility].

## 2011-12-31 NOTE — Behavioral Health Treatment Team (Signed)
Quietly resting in bed.  No seizure activity noted..  No cc/o pain/discomfort voiced.  Chart check completed.

## 2011-12-31 NOTE — Progress Notes (Signed)
Behavioral Health Progress Note    The patient [name] is cooperative,alert and oriented.mood and affect is animated when heis in the dayroom with peers, laughing and playing games in the groups.  He was seen by treatment team and [name] verbalized his issues with depression and stressors.  Verbalized, not feeling suicidal/homicidal. The patient has been compliant to their scheduled meals and medications.  [name] attends group meetings, tends to be sarcastic times.   [name], RN  12/31/2011

## 2011-12-31 NOTE — Behavioral Health Treatment Team (Cosign Needed)
GROUP THERAPY PROGRESS NOTE    [name] is participating in Reflections.     Group time: 30 minutes    Personal goal for participation: to review daily goals    Goal orientation: personal    Group therapy participation: active    Therapeutic interventions reviewed and discussed: yes    Impression of participation: good
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
## 2011-12-31 NOTE — Behavioral Health Treatment Team (Signed)
Pt has been visible on the unit this shift.  He is alert and orientated.  Pt is compliant with unit protocol, meals and medication.  Pt states he is not having thoughts of harming self or anyone else.  Continue to monitor every fifteen minutes for safety.

## 2012-01-01 MED ORDER — HYDROCHLOROTHIAZIDE 25 MG TAB
25 mg | ORAL_TABLET | Freq: Every day | ORAL | Status: DC
Start: 2012-01-01 — End: 2013-02-11

## 2012-01-01 MED ORDER — LEVETIRACETAM 500 MG TAB
500 mg | ORAL_TABLET | Freq: Two times a day (BID) | ORAL | Status: DC
Start: 2012-01-01 — End: 2012-01-25

## 2012-01-01 MED ORDER — MIRTAZAPINE 30 MG TAB
30 mg | ORAL_TABLET | Freq: Two times a day (BID) | ORAL | Status: DC
Start: 2012-01-01 — End: 2012-01-25

## 2012-01-01 MED ORDER — PHENYTOIN 50 MG CHEWABLE TAB
50 mg | ORAL_TABLET | Freq: Three times a day (TID) | ORAL | Status: DC
Start: 2012-01-01 — End: 2012-01-25

## 2012-01-01 MED ADMIN — atorvastatin (LIPITOR) tablet 20 mg: ORAL | @ 01:00:00 | NDC 68084056411

## 2012-01-01 MED ADMIN — phenytoin (DILANTIN) tablet 100 mg: ORAL | @ 16:00:00 | NDC 00071000740

## 2012-01-01 MED ADMIN — lamoTRIgine (LAMICTAL) tablet 150 mg: ORAL | @ 12:00:00 | NDC 68084031911

## 2012-01-01 MED ADMIN — levETIRAcetam (KEPPRA) tablet 500 mg: ORAL | @ 12:00:00 | NDC 64376013799

## 2012-01-01 MED ADMIN — mirtazapine (REMERON) tablet 22.5 mg: ORAL | @ 12:00:00 | NDC 68084011911

## 2012-01-01 MED ADMIN — hydrochlorothiazide (HYDRODIURIL) tablet 25 mg: ORAL | @ 12:00:00 | NDC 68084008611

## 2012-01-01 MED ADMIN — zolpidem (AMBIEN) tablet 10 mg: ORAL | @ 01:00:00 | NDC 00904608261

## 2012-01-01 MED ADMIN — phenytoin (DILANTIN) tablet 100 mg: ORAL | @ 12:00:00 | NDC 00071000740

## 2012-01-01 MED FILL — MIRTAZAPINE 15 MG TAB: 15 mg | ORAL | Qty: 2

## 2012-01-01 MED FILL — LIPITOR 10 MG TABLET: 10 mg | ORAL | Qty: 2

## 2012-01-01 MED FILL — HYDROCHLOROTHIAZIDE 25 MG TAB: 25 mg | ORAL | Qty: 1

## 2012-01-01 MED FILL — LEVETIRACETAM 500 MG TAB: 500 mg | ORAL | Qty: 1

## 2012-01-01 MED FILL — DILANTIN INFATABS 50 MG CHEWABLE TABLET: 50 mg | ORAL | Qty: 2

## 2012-01-01 MED FILL — LAMICTAL 100 MG TABLET: 100 mg | ORAL | Qty: 1

## 2012-01-01 MED FILL — ZOLPIDEM 5 MG TAB: 5 mg | ORAL | Qty: 2

## 2012-01-01 NOTE — Behavioral Health Treatment Team (Cosign Needed)
Pt dd not attend group toady.

## 2012-01-01 NOTE — Discharge Summary (Signed)
Patient seen, staffing held and discharge summary dictated: # [id number]  Please see dictated report for full details. Patient stable for discharge and considered to be at low risk of harm to self or others.  Informed consent given to the use of discharge medications.  Treatment rounds held in full.  Spent greater than 35 minutes on discharge work.

## 2012-01-01 NOTE — Behavioral Health Treatment Team (Signed)
Quietly resting in bed. No seizure activity noted.   No c/o pain/discomfort voiced.  Chart check completed.

## 2012-01-01 NOTE — Behavioral Health Treatment Team (Cosign Needed)
Pt did not attend Community Meeting this morning.

## 2012-01-02 NOTE — Discharge Summary (Signed)
Name:       [name], [name]            Admitted:    12/30/2011                                               Discharged:  01/01/2012  Account #:  [id number]                     DOB:         Aug 08, 1985  Consultant: Gillermina Hu, MD         Age          26                                 DISCHARGE SUMMARY      DISCHARGE DIAGNOSES  AXIS I:  1. Adjustment disorder, depressed mood.  2. Malingering.  3. Questionable history of fictitious disorder.  4. Questionable history of pseudoseizures/conversion reaction.  5. Polysubstance dependency, opiates, cocaine, marijuana, tobacco and  barbiturates.  6. History of dysthymia.  AXIS II: Severe borderline personality disorder with history of antisocial  traits.  AXIS III:  1. Obesity.  2. Seizures versus pseudoseizures.  3. Optic nerve trauma.  4. Hypercholesterolemia.  5. History of traumatic brain injury.  AXIS IV:  1. Lack of structure.  2. Multiple family losses.  3. Father with upcoming surgery.  4. Unemployment.  AXIS V: 60 and admission, 70 on discharge.    HISTORY OF PRESENT ILLNESS:  Please see dictated psychiatric evaluation by  writer in [facility] dated 12/31/11 for full details, as well as for a  description of mental status examination at the time of initial  presentation.    HOSPITALIZATION COURSE:  The patient admitted to the inpatient psychiatric  unit for acute stabilization and further observation. The patient rather  vague, evasive, and untruthful historian in my opinion. The patient seemed to  be overendorsing and embellishing psychiatric symptomatology to get into  the hospital for unclear reasons. The patient rather inconsistent in his  history. Psychotropic medications were adjusted. The patient gave informed  consent to the discontinuation of Prozac and instead further titration of  Remeron to help with anxiety and dysphoria. Antiseizure medications  reinstituted. The patient adamantly reports taking Lamictal as directed  prior to  admission. As a result Lamictal was reinstituted. The patient  warned about potential of adverse drug reaction if he was noncompliant with  the prior to admission and the usual dose restarted during hospitalization  stay. The patient confirmed that he has been indeed taking it as directed.    During the hospitalization course the patient demonstrated rather  significant improvement in overall mood and affect, though the patient was  not all that depressive during time of admission. The patient's affect was  not at all consistent with stated mood. The patient free of suicidal  ideation throughout hospitalization stay. The patient's request for  discharge will be granted. He is not considered to be immediate danger to  self or others.    DISCHARGE MEDICATIONS:  1. Lipitor 20 mg at bedtime.  2. Hydrochlorothiazide 25 mg daily.  3. Lamictal 150 mg b.i.d.  4. Keppra 500 mg b.i.d.  5. Remeron ________ mg b.i.d.  6. Dilantin 100 mg t.i.d.  DISPOSITION: The patient is being discharged home.    FOLLOWUP: The patient will have appropriate outpatient psychiatric followup  and outpatient medical followup. The patient to follow up with a  Neurologist as instructed. The patient to get Lamictal prescription from  his outpatient Neurologist.    PROGNOSIS: Rather poor given the severity of AXIS 2 psychopathology.              Gillermina Hu, MD    cc:    Gillermina Hu, MD      BRS/wmx; D: 01/01/2012 11:02 A; T: 01/02/2012 11:42 A; DOC# [id number]; Job#  [id number]

## 2012-01-25 ENCOUNTER — Inpatient Hospital Stay
Admit: 2012-01-25 | Discharge: 2012-01-26 | Discharge status: Against Medical Advice | Payer: MEDICARE | Attending: Neurology | Admitting: Neurology

## 2012-01-25 DIAGNOSIS — G40919 Epilepsy, unspecified, intractable, without status epilepticus: Secondary | ICD-10-CM

## 2012-01-25 MED ADMIN — sodium chloride (NS) flush 5-10 mL: INTRAVENOUS | @ 18:00:00 | NDC 82903065462

## 2012-01-25 NOTE — Progress Notes (Signed)
Left message with patient neurologist's office Dr. Theod[facility]).  Waiting for their fax number in order to send request for paperwork.

## 2012-01-25 NOTE — H&P (Signed)
Dict [phone].  Admitted to EMU for characterization of seizure vs. Pseudoseizures.  Continue usual anticonvulsants for now.
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
## 2012-01-25 NOTE — Progress Notes (Signed)
Physical Therapy Screening:  Services are not indicated at this time. No notes available in chart, however pt being admitted to [facility]. Please consult if PT needs arise.     An InBasket screening referral was triggered for physical therapy based on results obtained during the nursing admission assessment.  The patient???s chart was reviewed and the patient is not appropriate for a skilled therapy evaluation at this time.  Please consult physical therapy if any therapy needs arise.  Thank you.    Reina Fuse, PT

## 2012-01-25 NOTE — Progress Notes (Signed)
Bedside and Verbal shift change report given to [employer] (Cabin crew) by Josepha Pigg, RN   (offgoing nurse).  Report given with SBAR, Kardex, MAR and Recent Results.

## 2012-01-26 MED ADMIN — topiramate (TOPAMAX) tablet 25 mg: ORAL | @ 03:00:00 | NDC 68084034211

## 2012-01-26 MED ADMIN — enoxaparin (LOVENOX) injection 40 mg: SUBCUTANEOUS | @ 03:00:00 | NDC 00075062040

## 2012-01-26 MED ADMIN — lamoTRIgine (LAMICTAL) tablet 150 mg: ORAL | @ 03:00:00 | NDC 68084031911

## 2012-01-26 MED ADMIN — phenytoin ER (DILANTIN ER) ER capsule 100 mg: ORAL | @ 03:00:00 | NDC 00071036940

## 2012-01-26 MED ADMIN — lovastatin (MEVACOR) tablet 20 mg: ORAL | @ 03:00:00 | NDC 45963063401

## 2012-01-26 MED ADMIN — levETIRAcetam (KEPPRA) tablet 500 mg: ORAL | @ 03:00:00 | NDC 68084033711

## 2012-01-26 MED FILL — SUMATRIPTAN 6 MG/0.5 ML SUB-Q: 6 mg/0.5 mL | SUBCUTANEOUS | Qty: 0.5

## 2012-01-26 MED FILL — LEVETIRACETAM 500 MG TAB: 500 mg | ORAL | Qty: 1

## 2012-01-26 MED FILL — DILANTIN EXTENDED 100 MG CAPSULE: 100 mg | ORAL | Qty: 1

## 2012-01-26 MED FILL — BD POSIFLUSH NORMAL SALINE 0.9 % INJECTION SYRINGE: INTRAMUSCULAR | Qty: 10

## 2012-01-26 MED FILL — TOPIRAMATE 25 MG TAB: 25 mg | ORAL | Qty: 1

## 2012-01-26 MED FILL — LAMOTRIGINE 25 MG TAB: 25 mg | ORAL | Qty: 2

## 2012-01-26 MED FILL — LOVASTATIN 20 MG TAB: 20 mg | ORAL | Qty: 1

## 2012-01-26 MED FILL — LOVENOX 40 MG/0.4 ML SUBCUTANEOUS SYRINGE: 40 mg/0.4 mL | SUBCUTANEOUS | Qty: 0.4

## 2012-01-26 NOTE — Progress Notes (Signed)
0025- Pt is stating that he has to leave AMA due to his Mom being in critical care in the hospital in [place]. Pt stated he has to leave as soon as possible so he can be present as requested by his family.  49- Spoke with Dr Tiburcio Pea and supervisor Cricket RN who was notified of pt leaving AMA.   65- Form signed and pt informed that he is not to drive. Pt is calling a cab when he leaves. Electrodes removed and tele removed.  0115- Pt was walked off floor by PCT.

## 2012-01-26 NOTE — Discharge Summary (Signed)
Patient admitted for video EEG monitoring for seizures vs. Pseudoseizures.  After about 12 hours in the unit, he signed out AMA at 1AM on 01/26/12 saying that he had to go to [place] because his mother was ill.  He had no typical episodes while here, and his EEG was normal.  He complained of back pain and headache and was requesting narcotics, which we declined to order given documentation of polysubstance abuse, drug seeking behavior, and what appeared to be "hospital shopping."  He'll continue on his usual medications.  See admit note for further details.

## 2012-01-26 NOTE — Progress Notes (Signed)
Day 1 of EMU stay with continuous EEG and video monitoring completed. Pt left during the middle of the night AMA.

## 2012-11-12 ENCOUNTER — Emergency Department: Payer: Self-pay | Admitting: Emergency Medicine

## 2012-11-14 ENCOUNTER — Emergency Department: Payer: Self-pay | Admitting: Emergency Medicine

## 2012-11-14 LAB — CBC
HCT: 47.7 % (ref 40.0–52.0)
HGB: 16.2 g/dL (ref 13.0–18.0)
Platelet: 187 10*3/uL (ref 150–440)
RBC: 5.3 10*6/uL (ref 4.40–5.90)
RDW: 14.1 % (ref 11.5–14.5)

## 2012-11-14 LAB — COMPREHENSIVE METABOLIC PANEL
Calcium, Total: 9.6 mg/dL (ref 8.5–10.1)
Osmolality: 281 (ref 275–301)
SGOT(AST): 28 U/L (ref 15–37)
Sodium: 140 mmol/L (ref 136–145)

## 2012-11-20 ENCOUNTER — Emergency Department: Payer: Self-pay | Admitting: Emergency Medicine

## 2012-11-20 LAB — CBC
HGB: 13.8 g/dL (ref 13.0–18.0)
MCH: 30.6 pg (ref 26.0–34.0)
MCHC: 34.4 g/dL (ref 32.0–36.0)
MCV: 89 fL (ref 80–100)
Platelet: 200 10*3/uL (ref 150–440)
RBC: 4.52 10*6/uL (ref 4.40–5.90)
RDW: 13.8 % (ref 11.5–14.5)

## 2012-11-20 LAB — COMPREHENSIVE METABOLIC PANEL
Anion Gap: 10 (ref 7–16)
Chloride: 108 mmol/L — ABNORMAL HIGH (ref 98–107)
Co2: 24 mmol/L (ref 21–32)
EGFR (Non-African Amer.): >60
Glucose: 122 mg/dL — ABNORMAL HIGH (ref 65–99)
Osmolality: 283 (ref 275–301)
SGPT (ALT): 30 U/L (ref 12–78)
Sodium: 142 mmol/L (ref 136–145)

## 2012-11-20 LAB — ETHANOL: Ethanol %: <0.003 % (ref 0.000–0.080)

## 2012-11-20 LAB — PHENYTOIN LEVEL, TOTAL: Dilantin: 0.7 ug/mL — ABNORMAL LOW (ref 10.0–20.0)

## 2012-11-21 ENCOUNTER — Emergency Department (HOSPITAL_COMMUNITY)
Admission: EM | Admit: 2012-11-21 | Discharge: 2012-11-22 | Discharge status: Against Medical Advice | Payer: Medicare Other | Source: Home / Self Care | Attending: Emergency Medicine | Admitting: Emergency Medicine

## 2012-11-21 ENCOUNTER — Emergency Department (HOSPITAL_COMMUNITY)
Admission: EM | Admit: 2012-11-21 | Discharge: 2012-11-21 | Disposition: A | Discharge status: Home / Self Care | Payer: Medicare Other | Attending: Emergency Medicine | Admitting: Emergency Medicine

## 2012-11-21 ENCOUNTER — Encounter (HOSPITAL_COMMUNITY): Payer: Self-pay | Admitting: *Deleted

## 2012-11-21 ENCOUNTER — Encounter (HOSPITAL_COMMUNITY): Payer: Self-pay | Admitting: Emergency Medicine

## 2012-11-21 DIAGNOSIS — F319 Bipolar disorder, unspecified: Secondary | ICD-10-CM | POA: Insufficient documentation

## 2012-11-21 DIAGNOSIS — Z79899 Other long term (current) drug therapy: Secondary | ICD-10-CM | POA: Insufficient documentation

## 2012-11-21 DIAGNOSIS — G40909 Epilepsy, unspecified, not intractable, without status epilepticus: Secondary | ICD-10-CM | POA: Insufficient documentation

## 2012-11-21 DIAGNOSIS — Z8679 Personal history of other diseases of the circulatory system: Secondary | ICD-10-CM | POA: Insufficient documentation

## 2012-11-21 DIAGNOSIS — I10 Essential (primary) hypertension: Secondary | ICD-10-CM | POA: Insufficient documentation

## 2012-11-21 DIAGNOSIS — Z87820 Personal history of traumatic brain injury: Secondary | ICD-10-CM | POA: Insufficient documentation

## 2012-11-21 DIAGNOSIS — R7889 Finding of other specified substances, not normally found in blood: Secondary | ICD-10-CM

## 2012-11-21 DIAGNOSIS — R569 Unspecified convulsions: Secondary | ICD-10-CM

## 2012-11-21 DIAGNOSIS — R892 Abnormal level of other drugs, medicaments and biological substances in specimens from other organs, systems and tissues: Secondary | ICD-10-CM | POA: Insufficient documentation

## 2012-11-21 DIAGNOSIS — F17200 Nicotine dependence, unspecified, uncomplicated: Secondary | ICD-10-CM | POA: Insufficient documentation

## 2012-11-21 DIAGNOSIS — E785 Hyperlipidemia, unspecified: Secondary | ICD-10-CM | POA: Insufficient documentation

## 2012-11-21 DIAGNOSIS — Z87828 Personal history of other (healed) physical injury and trauma: Secondary | ICD-10-CM | POA: Insufficient documentation

## 2012-11-21 LAB — COMPREHENSIVE METABOLIC PANEL
ALT: 26 U/L (ref 0–53)
AST: 24 U/L (ref 0–37)
Albumin: 4.1 g/dL (ref 3.5–5.2)
Alkaline Phosphatase: 81 U/L (ref 39–117)
CO2: 26 mEq/L (ref 19–32)
Chloride: 107 mEq/L (ref 96–112)
Potassium: 4.4 mEq/L (ref 3.5–5.1)
Sodium: 143 mEq/L (ref 135–145)
Total Bilirubin: 0.2 mg/dL — ABNORMAL LOW (ref 0.3–1.2)

## 2012-11-21 LAB — URINALYSIS, COMPLETE
Glucose,UR: NEGATIVE mg/dL (ref 0–75)
Ketone: NEGATIVE
Leukocyte Esterase: NEGATIVE
Ph: 5 (ref 4.5–8.0)
RBC,UR: <1 /HPF (ref 0–5)
WBC UR: NONE SEEN /HPF (ref 0–5)

## 2012-11-21 LAB — DRUG SCREEN, URINE
Amphetamines, Ur Screen: NEGATIVE (ref ?–1000)
Barbiturates, Ur Screen: NEGATIVE (ref ?–200)
Cannabinoid 50 Ng, Ur ~~LOC~~: POSITIVE (ref ?–50)
Methadone, Ur Screen: NEGATIVE (ref ?–300)
Opiate, Ur Screen: NEGATIVE (ref ?–300)
Tricyclic, Ur Screen: NEGATIVE (ref ?–1000)

## 2012-11-21 LAB — CBC
Platelets: 222 10*3/uL (ref 150–400)
RBC: 4.84 MIL/uL (ref 4.22–5.81)
RDW: 13.4 % (ref 11.5–15.5)
WBC: 5.9 10*3/uL (ref 4.0–10.5)

## 2012-11-21 MED ORDER — HALOPERIDOL 5 MG PO TABS: 5.0000 mg | ORAL_TABLET | Freq: Two times a day (BID) | ORAL | Status: DC

## 2012-11-21 MED ORDER — HYDROCODONE-ACETAMINOPHEN 5-325 MG PO TABS
2.0000 | ORAL_TABLET | Freq: Once | ORAL | Status: AC
  Administered 2012-11-21: 2 via ORAL
  Filled 2012-11-21: qty 2

## 2012-11-21 MED ORDER — CLONAZEPAM 1 MG PO TABS: 1.0000 mg | ORAL_TABLET | Freq: Three times a day (TID) | ORAL | Status: DC | PRN

## 2012-11-21 MED ORDER — LISINOPRIL-HYDROCHLOROTHIAZIDE 10-12.5 MG PO TABS: 1.0000 | ORAL_TABLET | Freq: Every day | ORAL | Status: DC

## 2012-11-21 MED ORDER — PHENYTOIN SODIUM EXTENDED 100 MG PO CAPS
200.0000 mg | ORAL_CAPSULE | Freq: Once | ORAL | Status: AC
  Administered 2012-11-21: 200 mg via ORAL
  Filled 2012-11-21: qty 2

## 2012-11-21 MED ORDER — LEVETIRACETAM 1000 MG PO TABS: 1000.0000 mg | ORAL_TABLET | Freq: Two times a day (BID) | ORAL | Status: DC

## 2012-11-21 MED ORDER — PHENYTOIN SODIUM EXTENDED 100 MG PO CAPS: 100.0000 mg | ORAL_CAPSULE | Freq: Three times a day (TID) | ORAL | Status: DC
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
## 2012-11-21 NOTE — ED Notes (Signed)
States had 2 sz yesterday and was taken by ambulance to [facility] hospital. He was then put on [facility] floor w/out cause  And then signed out AMA and went home this am to friends house walked to [employer] and had another sz

## 2012-11-21 NOTE — ED Notes (Signed)
Pt reports that he had a seizure this evening around 8pm; pt states that he has a hx of seizures and his last seizure was yesterday; pt states that he does not normally have seizures that frequently.

## 2012-11-21 NOTE — ED Notes (Signed)
Patient given water and [place] sandwich at discharge.

## 2012-11-21 NOTE — ED Provider Notes (Signed)
History     CSN: [id number]  Arrival date & time 11/21/12  1323   First MD Initiated Contact with Patient 11/21/12 1342      Chief Complaint  Patient presents with  . Seizures    (Consider location/radiation/quality/duration/timing/severity/associated sxs/prior treatment) Patient is a 27 y.o. male presenting with seizures. The history is provided by the patient.  Seizures pt indicates hx seizure disorder since head injury approx 14 yrs ago. States had 2 seizures yesterday, went to [facility] ED - he states they 'cleared him' from seizure perspective, then moved him to their behavioral health area as he mentioned intermittent feelings of depression.  He indicates he signed out as his issue was seizures and not depression. Today at [employer] store states had gen seizure lasting 2 minutes, and requested to be brought here. States is from [place], [place], and was visiting friend in [place]. No recent trauma or injury. No headache. No neck or back pain. No numbness/weakness. No change in vision or speech. No sleep deprivation. No unusual stressors. States compliant w normal meds, no recent change in meds. States despite meds will periodically have seizure. Denies any recent febrile illness. No gu  C/o. No nvd. No fever or chills. States otherwise recent health at baseline. Denies incontinence. No oral injury.     Past Medical History  Diagnosis Date  . Traumatic brain injury   . Hypertension   . Hyperlipemia   . Bipolar 1 disorder   . Migraine   . Seizures   . Brain injury     Past Surgical History  Procedure Laterality Date  . Brain surgery    . Right arm growth plate      Family History  Problem Relation Age of Onset  . COPD Mother   . Coronary artery disease Mother   . Stroke Mother   . Diabetes Mother   . Diabetes Father     History  Substance Use Topics  . Smoking status: Current Every Day Smoker -- 1.50 packs/day    Types: Cigarettes  . Smokeless tobacco: Not on file   . Alcohol Use: Not on file      Review of Systems  Constitutional: Negative for fever and chills.  HENT: Negative for neck pain and neck stiffness.   Eyes: Negative for pain and visual disturbance.  Respiratory: Negative for cough and shortness of breath.   Cardiovascular: Negative for chest pain, palpitations and leg swelling.  Gastrointestinal: Negative for vomiting, abdominal pain and diarrhea.  Endocrine: Negative for polyuria.  Genitourinary: Negative for dysuria and flank pain.  Musculoskeletal: Negative for back pain.  Skin: Negative for rash.  Neurological: Positive for seizures. Negative for weakness, numbness and headaches.  Hematological: Does not bruise/bleed easily.  Psychiatric/Behavioral: Negative for confusion.    Allergies  Iodine; Shellfish allergy; and Iodine  Home Medications   Current Outpatient Rx  Name  Route  Sig  Dispense  Refill  . clonazePAM (KLONOPIN) 1 MG tablet   Oral   Take 1 tablet (1 mg total) by mouth 3 (three) times daily as needed. For anxiety   90 tablet   0   . clonazePAM (KLONOPIN) 1 MG tablet   Oral   Take 1 mg by mouth 3 (three) times daily as needed.         Marland Kitchen EXPIRED: FLUoxetine (PROZAC) 20 MG capsule   Oral   Take 60 mg by mouth at bedtime. For Depression         . FLUoxetine (PROZAC) 40  MG capsule   Oral   Take 40 mg by mouth daily.         . hydrochlorothiazide (HYDRODIURIL) 25 MG tablet   Oral   Take 1 tablet (25 mg total) by mouth daily. For control of high blood pressure   30 tablet   0   . EXPIRED: lamoTRIgine (LAMICTAL) 150 MG tablet   Oral   Take 1 tablet (150 mg total) by mouth 2 (two) times daily in the am and at bedtime.. For control of mood and seizures.   60 tablet   0   . lamoTRIgine (LAMICTAL) 150 MG tablet   Oral   Take 150 mg by mouth daily.         Marland Kitchen levETIRAcetam (KEPPRA) 1000 MG tablet   Oral   Take 1,000 mg by mouth 2 (two) times daily.         Marland Kitchen levETIRAcetam (KEPPRA) 250 MG  tablet   Oral   Take 1,000 mg by mouth 2 (two) times daily. For control of seizures. 250 every morning and 500 every night         . lovastatin (ALTOPREV) 20 MG 24 hr tablet   Oral   Take 1 tablet (20 mg total) by mouth daily before breakfast. To help lower cholesterol   30 tablet   0   . EXPIRED: mirtazapine (REMERON) 15 MG tablet   Oral   Take 1 tablet (15 mg total) by mouth at bedtime.   30 tablet   0   . mirtazapine (REMERON) 15 MG tablet   Oral   Take 15 mg by mouth at bedtime.         . phenytoin (DILANTIN) 100 MG ER capsule   Oral   Take 100 mg by mouth at bedtime. For control of seizures with Dilantin and Keppra and Lamictal         . phenytoin (DILANTIN) 300 MG ER capsule   Oral   Take 300 mg by mouth 3 (three) times daily.           BP 126/69  Pulse 76  Temp(Src) 98.2 F (36.8 C) (Oral)  Resp 18  SpO2 99%  Physical Exam  Nursing note and vitals reviewed. Constitutional: He is oriented to person, place, and time. He appears well-developed and well-nourished. No distress.  HENT:  Head: Atraumatic.  Nose: Nose normal.  Mouth/Throat: Oropharynx is clear and moist.  No oral or tongue trauma  Eyes: Conjunctivae are normal.  Neck: Normal range of motion. Neck supple. No tracheal deviation present.  No stiffness or rigidity  Cardiovascular: Normal rate, regular rhythm, normal heart sounds and intact distal pulses.   Pulmonary/Chest: Effort normal and breath sounds normal. No accessory muscle usage. No respiratory distress. He exhibits no tenderness.  Abdominal: Soft. Bowel sounds are normal. He exhibits no distension. There is no tenderness.  Genitourinary:  No cva tenderness  Musculoskeletal: Normal range of motion. He exhibits no edema and no tenderness.  CTLS spine, non tender, aligned, no step off.   Neurological: He is alert and oriented to person, place, and time.  Motor intact bil.   Skin: Skin is warm and dry. He is not diaphoretic.   Psychiatric: He has a normal mood and affect.    ED Course  Procedures (including critical care time)     MDM  Iv ns. sz precautions. Labs.  Reviewed nursing notes and prior charts for additional history.   Obtained faxed records from [facility] from yesterdays visit,  labs unremarkable.   Date: 11/21/2012  Rate: 67  Rhythm: normal sinus rhythm  QRS Axis: normal  Intervals: normal  ST/T Wave abnormalities: normal  Conduction Disutrbances:none  Narrative Interpretation:   Old EKG Reviewed: unchanged    Pt requests pain med in ed (all over body pain).  Spine nt. abd soft nt. vicodin po.  Labs pending.  Pt alert, content. No sz activity noted.   Signed out to oncoming provider to check labs, dilantin when back, recheck pt and dispo appropriately.       Suzi Roots, MD 11/23/12 [phone]

## 2012-11-21 NOTE — ED Provider Notes (Addendum)
Dilantin level is only 8.0, has historically been on the low side.  Will give additional 200 mg orally here to boost and pt instructed to discuss with his prescriber regarding low levels, persistently.    Arthur Singleton. Jemmie Ledgerwood, MD 11/21/12 1626   4:43 PM Pt reports he has none of his meds with him while he is visiting with his friend.  Will give refills of his seizure and BP and haldol meds.  Arthur Singleton. Flois Mctague, MD 11/21/12 1644

## 2012-11-22 ENCOUNTER — Encounter (HOSPITAL_COMMUNITY): Payer: Self-pay | Admitting: *Deleted

## 2012-11-22 ENCOUNTER — Emergency Department (HOSPITAL_COMMUNITY)
Admission: EM | Admit: 2012-11-22 | Discharge: 2012-11-22 | Disposition: A | Discharge status: Home / Self Care | Payer: Medicare Other | Attending: Emergency Medicine | Admitting: Emergency Medicine

## 2012-11-22 ENCOUNTER — Emergency Department (EMERGENCY_DEPARTMENT_HOSPITAL)
Admission: EM | Admit: 2012-11-22 | Discharge: 2012-11-22 | Disposition: A | Discharge status: Home / Self Care | Payer: Medicare Other | Source: Home / Self Care | Attending: Emergency Medicine | Admitting: Emergency Medicine

## 2012-11-22 ENCOUNTER — Encounter (HOSPITAL_COMMUNITY): Payer: Self-pay | Admitting: Emergency Medicine

## 2012-11-22 DIAGNOSIS — I10 Essential (primary) hypertension: Secondary | ICD-10-CM | POA: Insufficient documentation

## 2012-11-22 DIAGNOSIS — F17200 Nicotine dependence, unspecified, uncomplicated: Secondary | ICD-10-CM | POA: Insufficient documentation

## 2012-11-22 DIAGNOSIS — E785 Hyperlipidemia, unspecified: Secondary | ICD-10-CM | POA: Insufficient documentation

## 2012-11-22 DIAGNOSIS — G43909 Migraine, unspecified, not intractable, without status migrainosus: Secondary | ICD-10-CM | POA: Insufficient documentation

## 2012-11-22 DIAGNOSIS — Z87828 Personal history of other (healed) physical injury and trauma: Secondary | ICD-10-CM | POA: Insufficient documentation

## 2012-11-22 DIAGNOSIS — Z79899 Other long term (current) drug therapy: Secondary | ICD-10-CM | POA: Insufficient documentation

## 2012-11-22 DIAGNOSIS — F32A Depression, unspecified: Secondary | ICD-10-CM

## 2012-11-22 DIAGNOSIS — F319 Bipolar disorder, unspecified: Secondary | ICD-10-CM | POA: Insufficient documentation

## 2012-11-22 DIAGNOSIS — F329 Major depressive disorder, single episode, unspecified: Secondary | ICD-10-CM | POA: Insufficient documentation

## 2012-11-22 DIAGNOSIS — G40909 Epilepsy, unspecified, not intractable, without status epilepticus: Secondary | ICD-10-CM | POA: Insufficient documentation

## 2012-11-22 DIAGNOSIS — F1210 Cannabis abuse, uncomplicated: Secondary | ICD-10-CM

## 2012-11-22 DIAGNOSIS — Z87820 Personal history of traumatic brain injury: Secondary | ICD-10-CM | POA: Insufficient documentation

## 2012-11-22 DIAGNOSIS — F3289 Other specified depressive episodes: Secondary | ICD-10-CM | POA: Insufficient documentation

## 2012-11-22 DIAGNOSIS — F4310 Post-traumatic stress disorder, unspecified: Secondary | ICD-10-CM

## 2012-11-22 LAB — COMPREHENSIVE METABOLIC PANEL
ALT: 24 U/L (ref 0–53)
ALT: 23 U/L (ref 0–53)
AST: 20 U/L (ref 0–37)
Alkaline Phosphatase: 78 U/L (ref 39–117)
CO2: 30 mEq/L (ref 19–32)
CO2: 30 mEq/L (ref 19–32)
Calcium: 9.7 mg/dL (ref 8.4–10.5)
Chloride: 101 mEq/L (ref 96–112)
GFR calc Af Amer: >90 mL/min (ref >90)
GFR calc non Af Amer: >90 mL/min (ref >90)
GFR calc non Af Amer: >90 mL/min (ref >90)
Glucose, Bld: 81 mg/dL (ref 70–99)
Sodium: 142 mEq/L (ref 135–145)
Sodium: 139 mEq/L (ref 135–145)
Total Bilirubin: 0.2 mg/dL — ABNORMAL LOW (ref 0.3–1.2)
Total Bilirubin: 0.2 mg/dL — ABNORMAL LOW (ref 0.3–1.2)

## 2012-11-22 LAB — CBC
Hemoglobin: 14.5 g/dL (ref 13.0–17.0)
MCH: 30.2 pg (ref 26.0–34.0)
Platelets: 218 10*3/uL (ref 150–400)
RBC: 4.8 MIL/uL (ref 4.22–5.81)
RBC: 4.6 MIL/uL (ref 4.22–5.81)
WBC: 5.6 10*3/uL (ref 4.0–10.5)

## 2012-11-22 LAB — RAPID URINE DRUG SCREEN, HOSP PERFORMED
Amphetamines: NOT DETECTED
Barbiturates: NOT DETECTED
Benzodiazepines: POSITIVE — AB
Opiates: POSITIVE — AB
Opiates: NOT DETECTED
Tetrahydrocannabinol: POSITIVE — AB
Tetrahydrocannabinol: POSITIVE — AB

## 2012-11-22 LAB — PHENYTOIN LEVEL, TOTAL: Phenytoin Lvl: 9 ug/mL — ABNORMAL LOW (ref 10.0–20.0)

## 2012-11-22 LAB — ACETAMINOPHEN LEVEL: Acetaminophen (Tylenol), Serum: <15 ug/mL (ref 10–30)

## 2012-11-22 MED ORDER — IBUPROFEN 400 MG PO TABS: 600.0000 mg | ORAL_TABLET | Freq: Three times a day (TID) | ORAL | Status: DC | PRN

## 2012-11-22 MED ORDER — NICOTINE 14 MG/24HR TD PT24
14.0000 mg | MEDICATED_PATCH | TRANSDERMAL | Status: DC
  Filled 2012-11-22: qty 1

## 2012-11-22 MED ORDER — ADULT MULTIVITAMIN W/MINERALS CH: 1.0000 | ORAL_TABLET | Freq: Every day | ORAL | Status: DC

## 2012-11-22 MED ORDER — LISINOPRIL-HYDROCHLOROTHIAZIDE 10-12.5 MG PO TABS: 1.0000 | ORAL_TABLET | Freq: Every day | ORAL | Status: DC

## 2012-11-22 MED ORDER — LISINOPRIL 10 MG PO TABS: 10.0000 mg | ORAL_TABLET | Freq: Every day | ORAL | Status: DC

## 2012-11-22 MED ORDER — LORAZEPAM 1 MG PO TABS: 1.0000 mg | ORAL_TABLET | Freq: Three times a day (TID) | ORAL | Status: DC | PRN

## 2012-11-22 MED ORDER — ALUM & MAG HYDROXIDE-SIMETH 200-200-20 MG/5ML PO SUSP: 30.0000 mL | ORAL | Status: DC | PRN

## 2012-11-22 MED ORDER — ZOLPIDEM TARTRATE 5 MG PO TABS: 5.0000 mg | ORAL_TABLET | Freq: Every evening | ORAL | Status: DC | PRN

## 2012-11-22 MED ORDER — ACETAMINOPHEN 325 MG PO TABS: 650.0000 mg | ORAL_TABLET | ORAL | Status: DC | PRN

## 2012-11-22 MED ORDER — PHENYTOIN 50 MG PO CHEW
200.0000 mg | CHEWABLE_TABLET | Freq: Three times a day (TID) | ORAL | Status: DC
  Administered 2012-11-22: 200 mg via ORAL
  Filled 2012-11-22: qty 4

## 2012-11-22 MED ORDER — SIMVASTATIN 20 MG PO TABS
20.0000 mg | ORAL_TABLET | Freq: Every day | ORAL | Status: DC
  Filled 2012-11-22: qty 1

## 2012-11-22 MED ORDER — LOVASTATIN ER 20 MG PO TB24: 20.0000 mg | ORAL_TABLET | Freq: Every day | ORAL | Status: DC

## 2012-11-22 MED ORDER — CLONAZEPAM 0.5 MG PO TABS
1.0000 mg | ORAL_TABLET | Freq: Three times a day (TID) | ORAL | Status: DC
  Administered 2012-11-22: 1 mg via ORAL
  Filled 2012-11-22: qty 2

## 2012-11-22 MED ORDER — LEVETIRACETAM 500 MG PO TABS
1000.0000 mg | ORAL_TABLET | Freq: Two times a day (BID) | ORAL | Status: DC
  Filled 2012-11-22: qty 2

## 2012-11-22 MED ORDER — LAMOTRIGINE 150 MG PO TABS
150.0000 mg | ORAL_TABLET | ORAL | Status: DC
  Filled 2012-11-22 (×2): qty 1

## 2012-11-22 MED ORDER — ONDANSETRON HCL 8 MG PO TABS: 4.0000 mg | ORAL_TABLET | Freq: Three times a day (TID) | ORAL | Status: DC | PRN

## 2012-11-22 MED ORDER — PHENYTOIN SODIUM EXTENDED 100 MG PO CAPS
100.0000 mg | ORAL_CAPSULE | Freq: Three times a day (TID) | ORAL | Status: DC
  Administered 2012-11-22: 100 mg via ORAL
  Filled 2012-11-22: qty 2
  Filled 2012-11-22: qty 1

## 2012-11-22 MED ORDER — HYDROCHLOROTHIAZIDE 12.5 MG PO CAPS: 12.5000 mg | ORAL_CAPSULE | Freq: Every day | ORAL | Status: DC

## 2012-11-22 MED ORDER — HALOPERIDOL 5 MG PO TABS
5.0000 mg | ORAL_TABLET | Freq: Two times a day (BID) | ORAL | Status: DC
  Administered 2012-11-22: 5 mg via ORAL
  Filled 2012-11-22: qty 1

## 2012-11-22 NOTE — ED Notes (Addendum)
Pt upset states that he had 8 Haldol in his pants pocket when he came into this hospital, when he checked his belongings there was no Haldol.  MD notified.  Pt's story changed states that his Haldol and Klonopin are missing states that he wants a new prescription for the medications, Called and spoke with [facility], pt did have Haldol and Klonopin filled yesterday however per Dr. Shela Commons the EDP documentation states that the patient did not have his medications with him when he came into the hospital.  Gave pt his d/c papers, belongings and a bus pass, [employer] assisted to escort pt out of the hospital because pt demanding still demanding to speak with someone else about the matter.  Consulting civil engineer notified.

## 2012-11-22 NOTE — ED Notes (Signed)
Pt was seen by psych MD, upset with MD and wanted to speak with charge RN, pt states that if he does not receive inpatient treatment then he will sue the hospital, pt denies SI/HI/AVH but states that if he needs to "I will paint that picture for you because I know I need inpatient treatment." Pt states "I can play those cards if I need to so I can be admitted."  Notified MD.  Pt signed no-harm contract stating, "I am not having any suicidal thoughts right now and I have no intentions on hurting myself I have just been depressed for about 2 weeks and I know I need help.  I know myself and I have been treated for mental health for 14 years and I know that if I continue to be depressed then I could become suicidal in the future."

## 2012-11-22 NOTE — ED Notes (Signed)
Pt placed in blue scrubs and wanded by security 

## 2012-11-22 NOTE — Progress Notes (Signed)
Pt confirms pcp is  Dr Ignacia Marvel (per pt he prefers this neurologist as his pcp) EPIC updated

## 2012-11-22 NOTE — ED Notes (Addendum)
PATIENT MAY COME TO POD C 20 WHEN MEDICALLY CLEARED

## 2012-11-22 NOTE — ED Notes (Signed)
Pt is sleepy, calm, cooperative, denies SI/HI/AVH, states that he has been depressed x 2 weeks and states that he has recently has some deaths in his family.

## 2012-11-22 NOTE — Consult Note (Signed)
Reason for Consult: depression Referring Physician: Dr. Claudie Revering Arthur Singleton is an 27 y.o. male.  HPI: Patient was seen and chart reviewed. Patient is a 27 year old disabled, white male,  with a history of traumatic brain injury, bipolar disorder and seizures presented to [facility] for worsening symptoms of depression. Patient has not given symptoms of depression and not given information about his recent loss and his relationship with them but stated that he has planned visiting a friend here in town, friend works in mall and has family in [place], [place]. He does not want to be burdensome to his family members. He did not bring his medications with him. He has seizure yesterday while in a store, EMS taken him to ER and was seen in the [facility] yesterday afternoon and received Dilantin 200 mg oral load and a refill for his prescriptions.   The triage nurse reports he arrived by private vehicle this evening and gave his chief complaint is having had another seizure and has been somnolent since. He denied having a another seizure and insists that he is here because of depression, which often follows a seizure. He denies being homicidal or suicidal. He states he does not recall what happened between the time he left [facility] and the present time, but when the triage nurse was called to the room and advised him he arrived by bus he suddenly remembered that. He denies any gave a chief complaint of seizure but rather that he was here for "issues" regarding depression; the triage nurse advised him that he did indeed give a chief complaint of seizure on arrival.   Per triage notes from yesterday's visit he reported that he had been taking the [facility] 2 days ago and than was placed on their behavioral health service without cause. He reported that he signed out AMA before having the seizure.  Patient has been manipulative and demending in patient hospitalization even though he does not meet criteria  for the level of treatment. He has stated that his disability check was not due yet and can get it only in one to one of half week so that he can make plans of going back to his home, [place], [place].  His UDS is positive for benzo's, cannabis and opioids.   MSE: Patient appeared sleeping with snore and needs to be awaken. He is calm and cooperative. He has depressed mood but has animated and bright affect. He has normal speech and thought process. He has no LOA or FOI. He denied suicidal/homicideal ideations, intention and plan. He has no homicidal ideations, intension and plans. He has no evidence of psychosis.  Past Medical History  Diagnosis Date  . Traumatic brain injury   . Hypertension   . Hyperlipemia   . Bipolar 1 disorder   . Migraine   . Seizures   . Brain injury     Past Surgical History  Procedure Laterality Date  . Brain surgery    . Right arm growth plate      Family History  Problem Relation Age of Onset  . COPD Mother   . Coronary artery disease Mother   . Stroke Mother   . Diabetes Mother   . Diabetes Father     Social History:  reports that he has been smoking Cigarettes.  He has been smoking about 1.50 packs per day. He does not have any smokeless tobacco history on file. He reports that he does not drink alcohol or use illicit drugs.  Allergies:  Allergies  Allergen Reactions  . Iodine Anaphylaxis  . Shellfish Allergy Anaphylaxis  . Iodine     Medications: I have reviewed the patient's current medications.  Results for orders placed during the hospital encounter of 11/22/12 (from the past 48 hour(s))  URINE RAPID DRUG SCREEN (HOSP PERFORMED)     Status: Abnormal   Collection Time    11/22/12  4:19 AM      Result Value Range   Opiates POSITIVE (*) NONE DETECTED   Cocaine NONE DETECTED  NONE DETECTED   Benzodiazepines POSITIVE (*) NONE DETECTED   Amphetamines NONE DETECTED  NONE DETECTED   Tetrahydrocannabinol POSITIVE (*) NONE DETECTED   Barbiturates  NONE DETECTED  NONE DETECTED   Comment:            DRUG SCREEN FOR MEDICAL PURPOSES     ONLY.  IF CONFIRMATION IS NEEDED     FOR ANY PURPOSE, NOTIFY LAB     WITHIN 5 DAYS.                LOWEST DETECTABLE LIMITS     FOR URINE DRUG SCREEN     Drug Class       Cutoff (ng/mL)     Amphetamine      1000     Barbiturate      200     Benzodiazepine   200     Tricyclics       300     Opiates          300     Cocaine          300     THC              50  ACETAMINOPHEN LEVEL     Status: None   Collection Time    11/22/12  4:35 AM      Result Value Range   Acetaminophen (Tylenol), Serum <15.0  10 - 30 ug/mL   Comment:            THERAPEUTIC CONCENTRATIONS VARY     SIGNIFICANTLY. A RANGE OF 10-30     ug/mL MAY BE AN EFFECTIVE     CONCENTRATION FOR MANY PATIENTS.     HOWEVER, SOME ARE BEST TREATED     AT CONCENTRATIONS OUTSIDE THIS     RANGE.     ACETAMINOPHEN CONCENTRATIONS     >150 ug/mL AT 4 HOURS AFTER     INGESTION AND >50 ug/mL AT 12     HOURS AFTER INGESTION ARE     OFTEN ASSOCIATED WITH TOXIC     REACTIONS.  CBC     Status: None   Collection Time    11/22/12  4:35 AM      Result Value Range   WBC 6.3  4.0 - 10.5 K/uL   RBC 4.80  4.22 - 5.81 MIL/uL   Hemoglobin 14.5  13.0 - 17.0 g/dL   HCT 19.1  47.8 - 29.5 %   MCV 88.5  78.0 - 100.0 fL   MCH 30.2  26.0 - 34.0 pg   MCHC 34.1  30.0 - 36.0 g/dL   RDW 62.1  30.8 - 65.7 %   Platelets 237  150 - 400 K/uL  COMPREHENSIVE METABOLIC PANEL     Status: Abnormal   Collection Time    11/22/12  4:35 AM      Result Value Range   Sodium 142  135 - 145 mEq/L   Potassium  3.8  3.5 - 5.1 mEq/L   Chloride 105  96 - 112 mEq/L   CO2 30  19 - 32 mEq/L   Glucose, Bld 81  70 - 99 mg/dL   BUN 13  6 - 23 mg/dL   Creatinine, Ser 4.09  0.50 - 1.35 mg/dL   Calcium 9.7  8.4 - 81.1 mg/dL   Total Protein 7.0  6.0 - 8.3 g/dL   Albumin 3.9  3.5 - 5.2 g/dL   AST 18  0 - 37 U/L   ALT 24  0 - 53 U/L   Alkaline Phosphatase 78  39 - 117 U/L    Total Bilirubin 0.2 (*) 0.3 - 1.2 mg/dL   GFR calc non Af Amer >90  >90 mL/min   GFR calc Af Amer >90  >90 mL/min   Comment:            The eGFR has been calculated     using the CKD EPI equation.     This calculation has not been     validated in all clinical     situations.     eGFR's persistently     <90 mL/min signify     possible Chronic Kidney Disease.  ETHANOL     Status: None   Collection Time    11/22/12  4:35 AM      Result Value Range   Alcohol, Ethyl (B) <11  0 - 11 mg/dL   Comment:            LOWEST DETECTABLE LIMIT FOR     SERUM ALCOHOL IS 11 mg/dL     FOR MEDICAL PURPOSES ONLY  SALICYLATE LEVEL     Status: Abnormal   Collection Time    11/22/12  4:35 AM      Result Value Range   Salicylate Lvl <2.0 (*) 2.8 - 20.0 mg/dL    No results found.  Positive for bad mood, behavior problems, bipolar, borderline personality disorder, depression, illegal drug usage and sleep disturbance Blood pressure 126/65, pulse 65, temperature 97.7 F (36.5 C), temperature source Oral, resp. rate 18, SpO2 98.00%.   Assessment/Plan: Bipolar disorder vs depression NOS Post traumatic stress disorder Cannabis abuse   Recommendation: Patient does not meet criteria for acute psychiatric hospitalization as he contracted for safety and referred to out patient psychiatric services at [facility]. He has no changes in his medication at this visit. Case discussed with patient staff RN, Arthur Singleton and Arthur Singleton who agree with this disposition plan    Arthur Singleton,JANARDHAHA R. 11/22/2012, 11:05 AM

## 2012-11-22 NOTE — ED Provider Notes (Signed)
History     CSN: [id number]  Arrival date & time 11/21/12  2150   First MD Initiated Contact with Patient 11/22/12 0234      Chief Complaint  Patient presents with  . Seizures    (Consider location/radiation/quality/duration/timing/severity/associated sxs/prior treatment) HPI This is a 27 year old male with a history of traumatic brain injury, bipolar disorder and seizures. He is visiting a friend here in town and reportedly did not bring his medications with him. He seized yesterday and was seen in the ED yesterday afternoon. He was given a 200 mg oral load and a refill for his prescriptions. The triage nurse reports he arrived by private vehicle this evening and gave his chief complaint is having had another seizure. He has been somnolent since.  He was awakened by myself and was noted to initially be confused. He stated he had 2 seizures yesterday and was seen at [facility]. He states he recalls being discharged from [facility] and that he got his seizure medication refill. He initially denied having a third seizure and insists that he is here because of depression, which often follows a seizure. He denies being homicidal or suicidal. He states he does not recall what happened between the time he left [facility] and the present time, but when the triage nurse was called to the room and advised him he arrived by bus he suddenly remembered that. He denies any gave a chief complaint of seizure but rather that he was here for "issues" regarding depression; the triage nurse advised him that he did indeed give a chief complaint of seizure or arrival.  Per triage notes from yesterday's visit he reported that he had been taking the [facility] 2 days ago and that he was put on their behavioral health service without cause. He reported that he signed out AMA before having the seizure that occasioned his transport to [facility].  Past Medical History  Diagnosis Date   . Traumatic brain injury   . Hypertension   . Hyperlipemia   . Bipolar 1 disorder   . Migraine   . Seizures   . Brain injury     Past Surgical History  Procedure Laterality Date  . Brain surgery    . Right arm growth plate      Family History  Problem Relation Age of Onset  . COPD Mother   . Coronary artery disease Mother   . Stroke Mother   . Diabetes Mother   . Diabetes Father     History  Substance Use Topics  . Smoking status: Current Every Day Smoker -- 1.50 packs/day    Types: Cigarettes  . Smokeless tobacco: Not on file  . Alcohol Use: No      Review of Systems  All other systems reviewed and are negative.    Allergies  Iodine; Shellfish allergy; and Iodine  Home Medications   Current Outpatient Rx  Name  Route  Sig  Dispense  Refill  . clonazePAM (KLONOPIN) 1 MG tablet   Oral   Take 1 tablet (1 mg total) by mouth 3 (three) times daily as needed.   12 tablet   0   . haloperidol (HALDOL) 5 MG tablet   Oral   Take 1 tablet (5 mg total) by mouth 2 (two) times daily.   10 tablet   0   . EXPIRED: lamoTRIgine (LAMICTAL) 150 MG tablet   Oral   Take 1 tablet (150 mg total) by mouth  2 (two) times daily in the am and at bedtime.. For control of mood and seizures.   60 tablet   0   . levETIRAcetam (KEPPRA) 1000 MG tablet   Oral   Take 1 tablet (1,000 mg total) by mouth 2 (two) times daily.   10 tablet   0   . lisinopril-hydrochlorothiazide (PRINZIDE,ZESTORETIC) 10-12.5 MG per tablet   Oral   Take 1 tablet by mouth daily.   5 tablet   0   . lovastatin (ALTOPREV) 20 MG 24 hr tablet   Oral   Take 1 tablet (20 mg total) by mouth daily before breakfast. To help lower cholesterol   30 tablet   0   . Multiple Vitamin (MULTIVITAMIN WITH MINERALS) TABS   Oral   Take 1 tablet by mouth daily.         . phenytoin (DILANTIN) 100 MG ER capsule   Oral   Take 1 capsule (100 mg total) by mouth 3 (three) times daily. For control of seizures with  Dilantin and Keppra and Lamictal   15 capsule   0     BP 111/62  Pulse 65  Temp(Src) 98 F (36.7 C) (Oral)  Resp 16  SpO2 97%  Physical Exam General: Well-developed, well-nourished male in no acute distress; appearance consistent with age of record HENT: normocephalic, atraumatic Eyes: pupils equal round and reactive to light; right lateral strabismus Neck: supple Heart: regular rate and rhythm Lungs: clear to auscultation bilaterally Abdomen: soft; nondistended Extremities: No deformity; full range of motion Neurologic: Sleepy but arousable; motor function intact in all extremities and symmetric; no facial droop; slowness of speech Skin: Warm and dry Psychiatric: Argumentative; changes his story when challenged with facts; denies SI or HI    ED Course  Procedures (including critical care time)    MDM  3:11 AM Patient has elected to sign out AMA.       Hanley Seamen, MD 11/22/12 [phone]

## 2012-11-22 NOTE — ED Provider Notes (Signed)
History     CSN: [id number]  Arrival date & time 11/22/12  1404   First MD Initiated Contact with Patient 11/22/12 1619      Chief Complaint  Patient presents with  . Medical Clearance  . Suicidal    (Consider location/radiation/quality/duration/timing/severity/associated sxs/prior treatment) HPI 27 year old male history of bipolar disorder states that he has been having increasing depression over the last 10 days. He relates this to death in the family. He states he had been in the hospital yesterday with a seizure at home is that he was becoming suicidal with thoughts of taking too many pills. He has overdosed on pills in the past and has required inpatient psychiatric care in the past. He admits to occasional crying spells. He has early morning awakening and anhedonia. He denies homicidal ideation and denies hallucinations. He smokes one and a half packs of cigarettes a day, denies alcohol use, states he uses marijuana occasionally. He states last marijuana use was 3 weeks ago. He denies other illicit drug use. Past Medical History  Diagnosis Date  . Traumatic brain injury   . Hypertension   . Hyperlipemia   . Bipolar 1 disorder   . Migraine   . Seizures   . Brain injury     Past Surgical History  Procedure Laterality Date  . Brain surgery    . Right arm growth plate      Family History  Problem Relation Age of Onset  . COPD Mother   . Coronary artery disease Mother   . Stroke Mother   . Diabetes Mother   . Diabetes Father     History  Substance Use Topics  . Smoking status: Current Every Day Smoker -- 1.50 packs/day    Types: Cigarettes  . Smokeless tobacco: Not on file  . Alcohol Use: No      Review of Systems  Allergies  Iodine; Shellfish allergy; and Iodine  Home Medications   Current Outpatient Rx  Name  Route  Sig  Dispense  Refill  . clonazePAM (KLONOPIN) 1 MG tablet   Oral   Take 1 mg by mouth 3 (three) times daily.         . haloperidol  (HALDOL) 5 MG tablet   Oral   Take 1 tablet (5 mg total) by mouth 2 (two) times daily.   10 tablet   0   . lamoTRIgine (LAMICTAL) 150 MG tablet   Oral   Take 150 mg by mouth 2 (two) times daily.         Marland Kitchen levETIRAcetam (KEPPRA) 1000 MG tablet   Oral   Take 1 tablet (1,000 mg total) by mouth 2 (two) times daily.   10 tablet   0   . lisinopril-hydrochlorothiazide (PRINZIDE,ZESTORETIC) 10-12.5 MG per tablet   Oral   Take 1 tablet by mouth daily.   5 tablet   0   . lovastatin (ALTOPREV) 20 MG 24 hr tablet   Oral   Take 1 tablet (20 mg total) by mouth daily before breakfast. To help lower cholesterol   30 tablet   0   . Multiple Vitamin (MULTIVITAMIN WITH MINERALS) TABS   Oral   Take 1 tablet by mouth daily.         . phenytoin (DILANTIN) 100 MG ER capsule   Oral   Take 1 capsule (100 mg total) by mouth 3 (three) times daily. For control of seizures with Dilantin and Keppra and Lamictal   15 capsule   0   .  EXPIRED: lamoTRIgine (LAMICTAL) 150 MG tablet   Oral   Take 1 tablet (150 mg total) by mouth 2 (two) times daily in the am and at bedtime.. For control of mood and seizures.   60 tablet   0     BP 101/61  Pulse 62  Temp(Src) 98.2 F (36.8 C) (Oral)  Resp 16  SpO2 100%  Physical Exam 27 year old male, resting comfortably and in no acute distress. Vital signs are normal. Oxygen saturation is 100%, which is normal. Head is normocephalic and atraumatic. PERRLA, EOMI. Oropharynx is clear. Neck is nontender and supple without adenopathy or JVD. Back is nontender and there is no CVA tenderness. Lungs are clear without rales, wheezes, or rhonchi. Chest is nontender. Heart has regular rate and rhythm without murmur. Abdomen is soft, flat, nontender without masses or hepatosplenomegaly and peristalsis is normoactive. Extremities have no cyanosis or edema, full range of motion is present. Skin is warm and dry without rash. Neurologic: Mental status is normal,  cranial nerves are intact, there are no motor or sensory deficits. Psychiatric: Mood and affect appear normal. Thought processes are normal. He does not appear overtly depressed.  ED Course  Procedures (including critical care time)  Results for orders placed during the hospital encounter of 11/22/12  ACETAMINOPHEN LEVEL      Result Value Range   Acetaminophen (Tylenol), Serum <15.0  10 - 30 ug/mL  CBC      Result Value Range   WBC 5.6  4.0 - 10.5 K/uL   RBC 4.60  4.22 - 5.81 MIL/uL   Hemoglobin 13.9  13.0 - 17.0 g/dL   HCT 81.1  91.4 - 78.2 %   MCV 86.7  78.0 - 100.0 fL   MCH 30.2  26.0 - 34.0 pg   MCHC 34.8  30.0 - 36.0 g/dL   RDW 95.6  21.3 - 08.6 %   Platelets 218  150 - 400 K/uL  COMPREHENSIVE METABOLIC PANEL      Result Value Range   Sodium 139  135 - 145 mEq/L   Potassium 3.9  3.5 - 5.1 mEq/L   Chloride 101  96 - 112 mEq/L   CO2 30  19 - 32 mEq/L   Glucose, Bld 96  70 - 99 mg/dL   BUN 12  6 - 23 mg/dL   Creatinine, Ser 5.78  0.50 - 1.35 mg/dL   Calcium 9.7  8.4 - 46.9 mg/dL   Total Protein 6.8  6.0 - 8.3 g/dL   Albumin 3.9  3.5 - 5.2 g/dL   AST 20  0 - 37 U/L   ALT 23  0 - 53 U/L   Alkaline Phosphatase 77  39 - 117 U/L   Total Bilirubin 0.2 (*) 0.3 - 1.2 mg/dL   GFR calc non Af Amer >90  >90 mL/min   GFR calc Af Amer >90  >90 mL/min  ETHANOL      Result Value Range   Alcohol, Ethyl (B) <11  0 - 11 mg/dL  SALICYLATE LEVEL      Result Value Range   Salicylate Lvl <2.0 (*) 2.8 - 20.0 mg/dL  URINE RAPID DRUG SCREEN (HOSP PERFORMED)      Result Value Range   Opiates NONE DETECTED  NONE DETECTED   Cocaine NONE DETECTED  NONE DETECTED   Benzodiazepines POSITIVE (*) NONE DETECTED   Amphetamines NONE DETECTED  NONE DETECTED   Tetrahydrocannabinol POSITIVE (*) NONE DETECTED   Barbiturates NONE DETECTED  NONE  DETECTED  PHENYTOIN LEVEL, TOTAL      Result Value Range   Phenytoin Lvl 6.3 (*) 10.0 - 20.0 ug/mL    1. Depression   2. Bipolar 1 disorder       MDM   Depression with suicidal ideation. He has a history of prior suicide attempts. However, I reviewed his past history and he had been seen and evaluated it was a long hospital earlier today and psychiatrist felt that he did not meet requirements for inpatient care and discharged him. Psychiatry consultation will be repeated given his claim of suicidal ideation.  Psychiatry consultation appreciated. Psychiatrist does not feel he needs inpatient care. This is consistent with the evaluation by a psychiatrist earlier today. He will be discharged with instructions to followup at [facility].      Dione Booze, MD 11/22/12 [phone]

## 2012-11-22 NOTE — ED Notes (Signed)
Pt here with SI with plan to overdose; pt cooperative at present

## 2012-11-22 NOTE — ED Notes (Signed)
Per pt's request called pt advocacy and left message.

## 2012-11-22 NOTE — ED Notes (Addendum)
Patient very drowsy, difficult to obtain information from. Patient states he is in town visiting a friend, states he had a seizure yesterday and another seizure today. Patient reports he has a hx of seizures. Patient states he is taking his medications as prescribed, and denies any recent dosage changes. Seizure pads placed on patient bed for safety.

## 2012-11-22 NOTE — Progress Notes (Signed)
[facility] ED CM offered pt contact information for [facility] [place] will fill [facility] patients medications + [facility] [phone]

## 2012-11-22 NOTE — [facility] Suicide Risk Assessment (Signed)
Suicide Risk Assessment  Discharge Assessment     Demographic Factors:  Male, Adolescent or young adult, Caucasian, Low socioeconomic status and visiting friend and family from [place], [place]  Mental Status Per Nursing Assessment::   On Admission:     Current Mental Status by Physician: he is calm and cooperative. he has depressed mood and bright affect. he has normal spesch and Thought process. He has denied SI/HI , intension and plans. he has no evidence of psychosis.   Loss Factors: Decline in physical health and Financial problems/change in socioeconomic status  Historical Factors: Prior suicide attempts and lost episode is about two years ago as per patient report  Risk Reduction Factors:   Sense of responsibility to family, Religious beliefs about death, Living with another person, especially a relative, Positive social support and Positive therapeutic relationship  Continued Clinical Symptoms:  Bipolar Disorder:   Depressive phase Alcohol/Substance Abuse/Dependencies More than one psychiatric diagnosis Previous Psychiatric Diagnoses and Treatments Medical Diagnoses and Treatments/Surgeries  Cognitive Features That Contribute To Risk:  Polarized thinking    Suicide Risk:  Minimal: No identifiable suicidal ideation.  Patients presenting with no risk factors but with morbid ruminations; may be classified as minimal risk based on the severity of the depressive symptoms  Discharge Diagnoses:   AXIS I:  Bipolar, Depressed AXIS II:  Borderline Personality Dis. AXIS III:   Past Medical History  Diagnosis Date  . Traumatic brain injury   . Hypertension   . Hyperlipemia   . Bipolar 1 disorder   . Migraine   . Seizures   . Brain injury    AXIS IV:  economic problems, occupational problems, other psychosocial or environmental problems and problems related to social environment AXIS V:  41-50 serious symptoms  Plan Of Care/Follow-up recommendations:  Activity:  as  tolerated Diet:  regular  Is patient on multiple antipsychotic therapies at discharge:  No   Has Patient had three or more failed trials of antipsychotic monotherapy by history:  No  Recommended Plan for Multiple Antipsychotic Therapies: Not applicable  Dazha Kempa,JANARDHAHA R. 11/22/2012, 11:40 AM

## 2012-11-22 NOTE — ED Notes (Signed)
Pt checked in to ER earlier this pm for seizure and signed out AMA; pt went to lobby and then checked in for medical clearance stating he wants treatment for depression; pt denies SI or HI but states "It may progress to that but I am not right now"

## 2012-11-22 NOTE — ED Notes (Signed)
MD at bedside. Patient unable to articulate why he is at hospital. Patient unable to orient events of the evening. Patient states he was brought by EMS, when patient questioned about his prior statement that he had arrived by bus he recanted his prior statement. Patient rambling that he had 2 seizures in 2 days, then 3 seizures in 2 days, patient stated that was having worsening depression that he needed to be evaluated for, then stated that this is his normal depression that worsens "a little" after he has a seizure. Patient states he felt as though he was being cornered by having both doctor and nurse at bedside, stated that he knows he has the right to leave AMA and that's what he wants to do. Patient questioned if he is SI/HI, states he is not suicidal or homicidal and wants to leave AMA.

## 2012-11-23 ENCOUNTER — Encounter (HOSPITAL_COMMUNITY): Payer: Self-pay | Admitting: Emergency Medicine

## 2012-11-23 ENCOUNTER — Emergency Department (HOSPITAL_COMMUNITY)
Admission: EM | Admit: 2012-11-23 | Discharge: 2012-11-23 | Disposition: A | Discharge status: Home / Self Care | Payer: Medicare Other | Attending: Emergency Medicine | Admitting: Emergency Medicine

## 2012-11-23 DIAGNOSIS — R51 Headache: Secondary | ICD-10-CM | POA: Insufficient documentation

## 2012-11-23 DIAGNOSIS — F17200 Nicotine dependence, unspecified, uncomplicated: Secondary | ICD-10-CM | POA: Insufficient documentation

## 2012-11-23 DIAGNOSIS — R7889 Finding of other specified substances, not normally found in blood: Secondary | ICD-10-CM

## 2012-11-23 DIAGNOSIS — G40909 Epilepsy, unspecified, not intractable, without status epilepticus: Secondary | ICD-10-CM | POA: Insufficient documentation

## 2012-11-23 DIAGNOSIS — R892 Abnormal level of other drugs, medicaments and biological substances in specimens from other organs, systems and tissues: Secondary | ICD-10-CM | POA: Insufficient documentation

## 2012-11-23 DIAGNOSIS — I10 Essential (primary) hypertension: Secondary | ICD-10-CM | POA: Insufficient documentation

## 2012-11-23 DIAGNOSIS — Z9889 Other specified postprocedural states: Secondary | ICD-10-CM | POA: Insufficient documentation

## 2012-11-23 DIAGNOSIS — Z79899 Other long term (current) drug therapy: Secondary | ICD-10-CM | POA: Insufficient documentation

## 2012-11-23 DIAGNOSIS — Z87820 Personal history of traumatic brain injury: Secondary | ICD-10-CM | POA: Insufficient documentation

## 2012-11-23 DIAGNOSIS — R569 Unspecified convulsions: Secondary | ICD-10-CM

## 2012-11-23 DIAGNOSIS — G43909 Migraine, unspecified, not intractable, without status migrainosus: Secondary | ICD-10-CM | POA: Insufficient documentation

## 2012-11-23 DIAGNOSIS — E785 Hyperlipidemia, unspecified: Secondary | ICD-10-CM | POA: Insufficient documentation

## 2012-11-23 DIAGNOSIS — F319 Bipolar disorder, unspecified: Secondary | ICD-10-CM | POA: Insufficient documentation

## 2012-11-23 LAB — COMPREHENSIVE METABOLIC PANEL
ALT: 22 U/L (ref 0–53)
Alkaline Phosphatase: 81 U/L (ref 39–117)
CO2: 28 mEq/L (ref 19–32)
Calcium: 9.6 mg/dL (ref 8.4–10.5)
GFR calc Af Amer: >90 mL/min (ref >90)
GFR calc non Af Amer: >90 mL/min (ref >90)
Glucose, Bld: 99 mg/dL (ref 70–99)
Sodium: 141 mEq/L (ref 135–145)

## 2012-11-23 LAB — CBC WITH DIFFERENTIAL/PLATELET
Eosinophils Relative: 2 % (ref 0–5)
HCT: 38.9 % — ABNORMAL LOW (ref 39.0–52.0)
Hemoglobin: 13.8 g/dL (ref 13.0–17.0)
Lymphocytes Relative: 29 % (ref 12–46)
Lymphs Abs: 1.9 10*3/uL (ref 0.7–4.0)
MCV: 86.3 fL (ref 78.0–100.0)
Platelets: 241 10*3/uL (ref 150–400)
RBC: 4.51 MIL/uL (ref 4.22–5.81)
WBC: 6.6 10*3/uL (ref 4.0–10.5)

## 2012-11-23 LAB — PHENYTOIN LEVEL, TOTAL: Phenytoin Lvl: 6.5 ug/mL — ABNORMAL LOW (ref 10.0–20.0)

## 2012-11-23 LAB — RAPID URINE DRUG SCREEN, HOSP PERFORMED: Benzodiazepines: POSITIVE — AB

## 2012-11-23 MED ORDER — PHENYTOIN 50 MG PO CHEW
400.0000 mg | CHEWABLE_TABLET | Freq: Three times a day (TID) | ORAL | Status: DC
Start: 2012-11-23 — End: 2012-11-23

## 2012-11-23 MED ORDER — PHENYTOIN SODIUM EXTENDED 100 MG PO CAPS
400.0000 mg | ORAL_CAPSULE | Freq: Once | ORAL | Status: AC
  Administered 2012-11-23: 400 mg via ORAL
  Filled 2012-11-23: qty 4

## 2012-11-23 NOTE — Progress Notes (Signed)
Pt requested to speak with CSW concerning transportation back to [place], [place]. Pt stated that he was brought to [facility] ED from there and does not have his debit card or ID's.   CSW requested contact information from Pt so that CSW could assist Pt in locating transportation. Pt did provide contact information for [facility] [phone] (counselor Erick Blinks) and [facility] [phone]). Pt would not give parents information.   CSW contacted the shelter at [facility] to see if Pt was a resident there and Pt has been residing at that location. Facility did validate that Pt had a seizure at their facility and was taken to the hospital. After reading Pt chart, Pt left AMA from first hospital ([place]) and was on their [facility] unit. Pt then told MD (Per MD note) he had another seizure at [facility] and requested to be brought to [place]. Pt went to [facility] and was subsequently escorted off campus by police. Pt then came over to [facility] to be seen and was recently d/c'd this a.m.   CSW returned to the lobby to inform that a message was left with [facility] and we are awaiting a callback. When CSW went to lobby, Pt had left out to "smoke" and never returned. This information was then passed on to [facility] case worker and shelter.   No further needs at this time.   Leron Croak, LCSWA [facility] Emergency Dept.  [phone]

## 2012-11-23 NOTE — ED Provider Notes (Signed)
History     CSN: [id number]  Arrival date & time 11/23/12  1407   First MD Initiated Contact with Patient 11/23/12 1410      Chief Complaint  Patient presents with  . Seizures    (Consider location/radiation/quality/duration/timing/severity/associated sxs/prior treatment) HPI Arthur Singleton is a 27 y.o. male who present to ED with episode of seizure. States not sure what happened but woke up with his friend loading him into EMS. States hx of seizures, bipolar, migraines, traumatic brain injury. Per friend, pt was helping to serve food at a homeless shelter when had a tonic clonic seizure. Pt states he did get metalic taste in his mouth prior to seizure, but does not remember having it today. States currently has a seizure. Denies any other complaints. No changes in medications. States has been taking his medications as prescribed. Denies any current neuro deficits, other than stuttering which he says is normal for him.  Past Medical History  Diagnosis Date  . Traumatic brain injury   . Hypertension   . Hyperlipemia   . Bipolar 1 disorder   . Migraine   . Seizures   . Brain injury     Past Surgical History  Procedure Laterality Date  . Brain surgery    . Right arm growth plate      Family History  Problem Relation Age of Onset  . COPD Mother   . Coronary artery disease Mother   . Stroke Mother   . Diabetes Mother   . Diabetes Father     History  Substance Use Topics  . Smoking status: Current Every Day Smoker -- 1.50 packs/day    Types: Cigarettes  . Smokeless tobacco: Not on file  . Alcohol Use: No      Review of Systems  Constitutional: Negative for fever and chills.  HENT: Negative for neck pain and neck stiffness.   Eyes: Negative for photophobia, pain and visual disturbance.  Respiratory: Negative.   Cardiovascular: Negative.   Gastrointestinal: Negative.   Genitourinary: Negative for dysuria and flank pain.  Musculoskeletal: Negative for  myalgias and arthralgias.  Skin: Negative.   Neurological: Positive for seizures and headaches. Negative for dizziness and light-headedness.    Allergies  Iodine; Shellfish allergy; and Iodine  Home Medications   Current Outpatient Rx  Name  Route  Sig  Dispense  Refill  . clonazePAM (KLONOPIN) 1 MG tablet   Oral   Take 1 mg by mouth 3 (three) times daily.         . haloperidol (HALDOL) 5 MG tablet   Oral   Take 1 tablet (5 mg total) by mouth 2 (two) times daily.   10 tablet   0   . EXPIRED: lamoTRIgine (LAMICTAL) 150 MG tablet   Oral   Take 1 tablet (150 mg total) by mouth 2 (two) times daily in the am and at bedtime.. For control of mood and seizures.   60 tablet   0   . lamoTRIgine (LAMICTAL) 150 MG tablet   Oral   Take 150 mg by mouth 2 (two) times daily.         Marland Kitchen levETIRAcetam (KEPPRA) 1000 MG tablet   Oral   Take 1 tablet (1,000 mg total) by mouth 2 (two) times daily.   10 tablet   0   . lisinopril-hydrochlorothiazide (PRINZIDE,ZESTORETIC) 10-12.5 MG per tablet   Oral   Take 1 tablet by mouth daily.   5 tablet   0   . lovastatin (ALTOPREV)  20 MG 24 hr tablet   Oral   Take 1 tablet (20 mg total) by mouth daily before breakfast. To help lower cholesterol   30 tablet   0   . Multiple Vitamin (MULTIVITAMIN WITH MINERALS) TABS   Oral   Take 1 tablet by mouth daily.         . phenytoin (DILANTIN) 100 MG ER capsule   Oral   Take 1 capsule (100 mg total) by mouth 3 (three) times daily. For control of seizures with Dilantin and Keppra and Lamictal   15 capsule   0     There were no vitals taken for this visit.  Physical Exam  Nursing note and vitals reviewed. Constitutional: He is oriented to person, place, and time. He appears well-developed and well-nourished. No distress.  HENT:  Head: Normocephalic.  Eyes: Conjunctivae are normal. Pupils are equal, round, and reactive to light.  Neck: Neck supple.  Cardiovascular: Normal rate, regular  rhythm and normal heart sounds.   Pulmonary/Chest: Effort normal. No respiratory distress. He has no wheezes. He has no rales.  Abdominal: Soft. Bowel sounds are normal. He exhibits no distension. There is no tenderness. There is no rebound.  Musculoskeletal: He exhibits no edema.  Neurological: He is alert and oriented to person, place, and time.  5/5 and equal upper and lower extremity strength bilaterally. Equal grip strength bilaterally. Normal finger to nose and heel to shin. No pronator drift.   Skin: Skin is warm and dry.    ED Course  Procedures (including critical care time)  Pt with recurrent seizures. Usually low dilantin level.pt alert oriented, no distress  Results for orders placed during the hospital encounter of 11/23/12  CBC WITH DIFFERENTIAL      Result Value Range   WBC 6.6  4.0 - 10.5 K/uL   RBC 4.51  4.22 - 5.81 MIL/uL   Hemoglobin 13.8  13.0 - 17.0 g/dL   HCT 16.1 (*) 09.6 - 04.5 %   MCV 86.3  78.0 - 100.0 fL   MCH 30.6  26.0 - 34.0 pg   MCHC 35.5  30.0 - 36.0 g/dL   RDW 40.9  81.1 - 91.4 %   Platelets 241  150 - 400 K/uL   Neutrophils Relative 62  43 - 77 %   Neutro Abs 4.1  1.7 - 7.7 K/uL   Lymphocytes Relative 29  12 - 46 %   Lymphs Abs 1.9  0.7 - 4.0 K/uL   Monocytes Relative 6  3 - 12 %   Monocytes Absolute 0.4  0.1 - 1.0 K/uL   Eosinophils Relative 2  0 - 5 %   Eosinophils Absolute 0.2  0.0 - 0.7 K/uL   Basophils Relative 0  0 - 1 %   Basophils Absolute 0.0  0.0 - 0.1 K/uL  COMPREHENSIVE METABOLIC PANEL      Result Value Range   Sodium 141  135 - 145 mEq/L   Potassium 3.5  3.5 - 5.1 mEq/L   Chloride 104  96 - 112 mEq/L   CO2 28  19 - 32 mEq/L   Glucose, Bld 99  70 - 99 mg/dL   BUN 14  6 - 23 mg/dL   Creatinine, Ser 7.82  0.50 - 1.35 mg/dL   Calcium 9.6  8.4 - 95.6 mg/dL   Total Protein 6.8  6.0 - 8.3 g/dL   Albumin 3.9  3.5 - 5.2 g/dL   AST 16  0 - 37 U/L  ALT 22  0 - 53 U/L   Alkaline Phosphatase 81  39 - 117 U/L   Total Bilirubin 0.2  (*) 0.3 - 1.2 mg/dL   GFR calc non Af Amer >90  >90 mL/min   GFR calc Af Amer >90  >90 mL/min  PHENYTOIN LEVEL, TOTAL      Result Value Range   Phenytoin Lvl 6.5 (*) 10.0 - 20.0 ug/mL   No results found.   1. Seizure   2. Dilantin level too low       MDM  PT with low dilantin level, given 400mg  PO in ED. Pt instructed to take 300mg  when gets home and restart regular doses tonight. Pt in no distress. AAOx3, no neuro deficits. Plan to d/c home with close pcp follow up.  Filed Vitals:   11/23/12 1419  BP: 104/62  Pulse: 101  Temp: 98.9 F (37.2 C)  TempSrc: Oral  Resp: 20  SpO2: 98%           Arthur Vayda A Aliciana Ricciardi, PA-C 11/23/12 1629

## 2012-11-23 NOTE — ED Notes (Signed)
Per EMS - pt was at a homeless shelter helping serve food when he had a seizure, friends took him to a fire station. Pt was alert upon EMS arrival. Pt is stuttering, pt reports this is normal after he has a seizure. BP 140/90 HR 100 CBG 127. EMS started a 22G in left hand. Pt has hx of seizures, started about 14 yrs ago after and TBI. Pt is blind in right eye. Pt takes dilation and Lamictal and keppra for seizures, pt reports he has taken all his medications today. Pt c/o HA after his seizures, sts that is normal for him.

## 2012-11-24 NOTE — ED Provider Notes (Signed)
Medical screening examination/treatment/procedure(s) were performed by non-physician practitioner and as supervising physician I was immediately available for consultation/collaboration.   Jonia Oakey L Shakyra Mattera, MD 11/24/12 2328 

## 2012-11-25 NOTE — ED Provider Notes (Signed)
History     CSN: [id number]  Arrival date & time 11/22/12  4098   First MD Initiated Contact with Patient 11/22/12 [phone]      Chief Complaint  Patient presents with  . Medical Clearance    (Consider location/radiation/quality/duration/timing/severity/associated sxs/prior treatment) HPI.... nursing notes reviewed.  Patient in emergency department earlier This evening but signed out AMA.  He has returned for evaluation of depression.  No suicidal or homicidal ideation. Nothing makes symptoms better or worse. Severity is mild to moderate.  Past Medical History  Diagnosis Date  . Traumatic brain injury   . Hypertension   . Hyperlipemia   . Bipolar 1 disorder   . Migraine   . Seizures   . Brain injury     Past Surgical History  Procedure Laterality Date  . Brain surgery    . Right arm growth plate      Family History  Problem Relation Age of Onset  . COPD Mother   . Coronary artery disease Mother   . Stroke Mother   . Diabetes Mother   . Diabetes Father     History  Substance Use Topics  . Smoking status: Current Every Day Smoker -- 1.50 packs/day    Types: Cigarettes  . Smokeless tobacco: Not on file  . Alcohol Use: No      Review of Systems  All other systems reviewed and are negative.    Allergies  Iodine; Shellfish allergy; and Iodine  Home Medications   Current Outpatient Rx  Name  Route  Sig  Dispense  Refill  . haloperidol (HALDOL) 5 MG tablet   Oral   Take 1 tablet (5 mg total) by mouth 2 (two) times daily.   10 tablet   0   . levETIRAcetam (KEPPRA) 1000 MG tablet   Oral   Take 1 tablet (1,000 mg total) by mouth 2 (two) times daily.   10 tablet   0   . lisinopril-hydrochlorothiazide (PRINZIDE,ZESTORETIC) 10-12.5 MG per tablet   Oral   Take 1 tablet by mouth daily.   5 tablet   0   . lovastatin (ALTOPREV) 20 MG 24 hr tablet   Oral   Take 1 tablet (20 mg total) by mouth daily before breakfast. To help lower cholesterol   30  tablet   0   . Multiple Vitamin (MULTIVITAMIN WITH MINERALS) TABS   Oral   Take 1 tablet by mouth daily.         . phenytoin (DILANTIN) 100 MG ER capsule   Oral   Take 1 capsule (100 mg total) by mouth 3 (three) times daily. For control of seizures with Dilantin and Keppra and Lamictal   15 capsule   0   . clonazePAM (KLONOPIN) 1 MG tablet   Oral   Take 1 mg by mouth 3 (three) times daily.         Marland Kitchen EXPIRED: lamoTRIgine (LAMICTAL) 150 MG tablet   Oral   Take 1 tablet (150 mg total) by mouth 2 (two) times daily in the am and at bedtime.. For control of mood and seizures.   60 tablet   0   . lamoTRIgine (LAMICTAL) 150 MG tablet   Oral   Take 150 mg by mouth 2 (two) times daily.           BP 115/74  Pulse 86  Temp(Src) 98.5 F (36.9 C) (Oral)  Resp 18  SpO2 96%  Physical Exam  Nursing note and vitals reviewed. Constitutional:  He is oriented to person, place, and time. He appears well-developed and well-nourished.  HENT:  Head: Normocephalic and atraumatic.  Eyes: Conjunctivae and EOM are normal. Pupils are equal, round, and reactive to light.  Neck: Normal range of motion. Neck supple.  Cardiovascular: Normal rate, regular rhythm and normal heart sounds.   Pulmonary/Chest: Effort normal and breath sounds normal.  Abdominal: Soft. Bowel sounds are normal.  Musculoskeletal: Normal range of motion.  Neurological: He is alert and oriented to person, place, and time.  Skin: Skin is warm and dry.  Psychiatric:  Flat affect, depressed    ED Course  Procedures (including critical care time)  Labs Reviewed  COMPREHENSIVE METABOLIC PANEL - Abnormal; Notable for the following:    Total Bilirubin 0.2 (*)    All other components within normal limits  SALICYLATE LEVEL - Abnormal; Notable for the following:    Salicylate Lvl <2.0 (*)    All other components within normal limits  URINE RAPID DRUG SCREEN (HOSP PERFORMED) - Abnormal; Notable for the following:     Opiates POSITIVE (*)    Benzodiazepines POSITIVE (*)    Tetrahydrocannabinol POSITIVE (*)    All other components within normal limits  ACETAMINOPHEN LEVEL  CBC  ETHANOL   No results found.   1. Bipolar 1 disorder       MDM  No frank suicidal or homicidal ideation.   Patient will followup with community resources.        Donnetta Hutching, MD 11/25/12 [phone]

## 2012-11-28 ENCOUNTER — Encounter (HOSPITAL_COMMUNITY): Payer: Self-pay | Admitting: Emergency Medicine

## 2012-11-28 ENCOUNTER — Emergency Department (HOSPITAL_COMMUNITY)
Admission: EM | Admit: 2012-11-28 | Discharge: 2012-11-28 | Disposition: A | Discharge status: Home / Self Care | Payer: Medicare Other | Source: Home / Self Care | Attending: Emergency Medicine | Admitting: Emergency Medicine

## 2012-11-28 DIAGNOSIS — G40909 Epilepsy, unspecified, not intractable, without status epilepticus: Secondary | ICD-10-CM

## 2012-11-28 DIAGNOSIS — R7889 Finding of other specified substances, not normally found in blood: Secondary | ICD-10-CM

## 2012-11-28 LAB — PHENYTOIN LEVEL, TOTAL: Phenytoin Lvl: <2.5 ug/mL — ABNORMAL LOW (ref 10.0–20.0)

## 2012-11-28 MED ORDER — PHENYTOIN SODIUM EXTENDED 100 MG PO CAPS: 100.0000 mg | ORAL_CAPSULE | Freq: Four times a day (QID) | ORAL | Status: DC

## 2012-11-28 MED ORDER — PHENYTOIN SODIUM EXTENDED 100 MG PO CAPS
300.0000 mg | ORAL_CAPSULE | Freq: Once | ORAL | Status: AC
  Administered 2012-11-28: 300 mg via ORAL
  Filled 2012-11-28: qty 3

## 2012-11-28 MED ORDER — PHENYTOIN SODIUM EXTENDED 100 MG PO CAPS
300.0000 mg | ORAL_CAPSULE | Freq: Once | ORAL | Status: AC
  Administered 2012-11-28: 300 mg via ORAL

## 2012-11-28 MED ORDER — HYDROCODONE-ACETAMINOPHEN 5-325 MG PO TABS
2.0000 | ORAL_TABLET | Freq: Once | ORAL | Status: AC
  Administered 2012-11-28: 2 via ORAL
  Filled 2012-11-28: qty 2

## 2012-11-28 MED ORDER — IBUPROFEN 200 MG PO TABS
600.0000 mg | ORAL_TABLET | Freq: Once | ORAL | Status: AC
  Administered 2012-11-28: 600 mg via ORAL
  Filled 2012-11-28: qty 3

## 2012-11-28 MED ORDER — PHENYTOIN SODIUM EXTENDED 100 MG PO CAPS
300.0000 mg | ORAL_CAPSULE | Freq: Once | ORAL | Status: AC
  Administered 2012-11-28: 300 mg via ORAL
  Filled 2012-11-28 (×2): qty 3
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
## 2012-11-28 NOTE — ED Notes (Signed)
Per ems- pt has hx of epilepsy and has been having seizures more frequently. Last seizure a couple days ago and his levels were low- has been taking Dilantin but still had seizure tonight lasting 2 mins and 1 min post ictal. Pt fell but did not hit head. No incontinence. Pt c/o of headache.  Apt with neurologist in a week. Alert and oriented x4

## 2012-11-28 NOTE — ED Notes (Signed)
Pt can have second dose of Dilantin 2 hours after first per EDP [place]. Second dose scheduled for 06:30am.

## 2012-11-29 ENCOUNTER — Encounter (HOSPITAL_COMMUNITY): Payer: Self-pay | Admitting: *Deleted

## 2012-11-29 ENCOUNTER — Emergency Department (HOSPITAL_COMMUNITY): Payer: Medicare Other

## 2012-11-29 ENCOUNTER — Encounter (HOSPITAL_COMMUNITY): Payer: Self-pay | Admitting: Emergency Medicine

## 2012-11-29 ENCOUNTER — Inpatient Hospital Stay (HOSPITAL_COMMUNITY)
Admission: AD | Admit: 2012-11-29 | Discharge: 2012-12-01 | DRG: 885 | Disposition: A | Discharge status: Home / Self Care | Payer: Medicare Other | Attending: Psychiatry | Admitting: Psychiatry

## 2012-11-29 ENCOUNTER — Emergency Department (HOSPITAL_COMMUNITY)
Admission: EM | Admit: 2012-11-29 | Discharge: 2012-11-29 | Disposition: A | Discharge status: Home / Self Care | Payer: Medicare Other | Source: Home / Self Care | Attending: Emergency Medicine | Admitting: Emergency Medicine

## 2012-11-29 ENCOUNTER — Encounter (HOSPITAL_COMMUNITY): Payer: Self-pay | Admitting: Behavioral Health

## 2012-11-29 ENCOUNTER — Emergency Department (HOSPITAL_COMMUNITY)
Admission: EM | Admit: 2012-11-29 | Discharge: 2012-11-29 | Disposition: A | Discharge status: Other Acute Inpatient Hospital | Payer: Medicare Other | Source: Home / Self Care | Attending: Emergency Medicine | Admitting: Emergency Medicine

## 2012-11-29 DIAGNOSIS — F4310 Post-traumatic stress disorder, unspecified: Secondary | ICD-10-CM

## 2012-11-29 DIAGNOSIS — F332 Major depressive disorder, recurrent severe without psychotic features: Principal | ICD-10-CM | POA: Diagnosis present

## 2012-11-29 DIAGNOSIS — Z634 Disappearance and death of family member: Secondary | ICD-10-CM

## 2012-11-29 DIAGNOSIS — R45851 Suicidal ideations: Secondary | ICD-10-CM

## 2012-11-29 DIAGNOSIS — G40909 Epilepsy, unspecified, not intractable, without status epilepticus: Secondary | ICD-10-CM

## 2012-11-29 DIAGNOSIS — Z79899 Other long term (current) drug therapy: Secondary | ICD-10-CM

## 2012-11-29 DIAGNOSIS — F319 Bipolar disorder, unspecified: Secondary | ICD-10-CM

## 2012-11-29 DIAGNOSIS — I10 Essential (primary) hypertension: Secondary | ICD-10-CM | POA: Diagnosis present

## 2012-11-29 DIAGNOSIS — F411 Generalized anxiety disorder: Secondary | ICD-10-CM

## 2012-11-29 HISTORY — DX: Anxiety disorder, unspecified: F41.9

## 2012-11-29 LAB — CBC WITH DIFFERENTIAL/PLATELET
Basophils Absolute: 0 10*3/uL (ref 0.0–0.1)
Basophils Relative: 0 % (ref 0–1)
Eosinophils Absolute: 0 10*3/uL (ref 0.0–0.7)
HCT: 40.1 % (ref 39.0–52.0)
MCH: 30.4 pg (ref 26.0–34.0)
MCHC: 35.7 g/dL (ref 30.0–36.0)
Monocytes Absolute: 0.4 10*3/uL (ref 0.1–1.0)
Monocytes Relative: 5 % (ref 3–12)
Neutro Abs: 6.2 10*3/uL (ref 1.7–7.7)
RDW: 13.3 % (ref 11.5–15.5)

## 2012-11-29 LAB — RAPID URINE DRUG SCREEN, HOSP PERFORMED
Amphetamines: NOT DETECTED
Barbiturates: NOT DETECTED
Benzodiazepines: NOT DETECTED
Cocaine: NOT DETECTED
Opiates: NOT DETECTED
Tetrahydrocannabinol: POSITIVE — AB

## 2012-11-29 LAB — COMPREHENSIVE METABOLIC PANEL
AST: 19 U/L (ref 0–37)
Albumin: 3.8 g/dL (ref 3.5–5.2)
BUN: 10 mg/dL (ref 6–23)
Calcium: 9.5 mg/dL (ref 8.4–10.5)
Chloride: 104 mEq/L (ref 96–112)
Creatinine, Ser: 0.69 mg/dL (ref 0.50–1.35)
Total Bilirubin: 0.3 mg/dL (ref 0.3–1.2)
Total Protein: 6.9 g/dL (ref 6.0–8.3)

## 2012-11-29 MED ORDER — ACETAMINOPHEN 325 MG PO TABS: 650.0000 mg | ORAL_TABLET | Freq: Four times a day (QID) | ORAL | Status: DC | PRN

## 2012-11-29 MED ORDER — PHENYTOIN SODIUM EXTENDED 100 MG PO CAPS
100.0000 mg | ORAL_CAPSULE | Freq: Four times a day (QID) | ORAL | Status: DC
  Administered 2012-11-29: 100 mg via ORAL
  Filled 2012-11-29: qty 1

## 2012-11-29 MED ORDER — HYDROCHLOROTHIAZIDE 12.5 MG PO CAPS
12.5000 mg | ORAL_CAPSULE | Freq: Every day | ORAL | Status: DC
  Administered 2012-11-30 – 2012-12-01 (×2): 12.5 mg via ORAL
  Filled 2012-11-29 (×4): qty 1

## 2012-11-29 MED ORDER — LISINOPRIL-HYDROCHLOROTHIAZIDE 10-12.5 MG PO TABS: 1.0000 | ORAL_TABLET | Freq: Every day | ORAL | Status: DC

## 2012-11-29 MED ORDER — PROCHLORPERAZINE EDISYLATE 5 MG/ML IJ SOLN
10.0000 mg | Freq: Once | INTRAMUSCULAR | Status: AC
  Administered 2012-11-29: 10 mg via INTRAVENOUS
  Filled 2012-11-29: qty 2

## 2012-11-29 MED ORDER — HALOPERIDOL 5 MG PO TABS
5.0000 mg | ORAL_TABLET | Freq: Two times a day (BID) | ORAL | Status: DC
  Administered 2012-11-29: 5 mg via ORAL
  Filled 2012-11-29: qty 1

## 2012-11-29 MED ORDER — LEVETIRACETAM 500 MG PO TABS: 1000.0000 mg | ORAL_TABLET | Freq: Two times a day (BID) | ORAL | Status: DC

## 2012-11-29 MED ORDER — DEXAMETHASONE SODIUM PHOSPHATE 10 MG/ML IJ SOLN
10.0000 mg | Freq: Once | INTRAMUSCULAR | Status: AC
  Administered 2012-11-29: 10 mg via INTRAVENOUS
  Filled 2012-11-29: qty 1

## 2012-11-29 MED ORDER — CLONAZEPAM 0.5 MG PO TABS
1.0000 mg | ORAL_TABLET | Freq: Three times a day (TID) | ORAL | Status: DC
  Administered 2012-11-29: 1 mg via ORAL
  Filled 2012-11-29: qty 2

## 2012-11-29 MED ORDER — HYDROCHLOROTHIAZIDE 12.5 MG PO CAPS
12.5000 mg | ORAL_CAPSULE | Freq: Every day | ORAL | Status: DC
  Administered 2012-11-29: 12.5 mg via ORAL
  Filled 2012-11-29: qty 1

## 2012-11-29 MED ORDER — DIPHENHYDRAMINE HCL 50 MG/ML IJ SOLN
25.0000 mg | Freq: Once | INTRAMUSCULAR | Status: AC
Start: 2012-11-29 — End: 2012-11-29
  Administered 2012-11-29: 25 mg via INTRAVENOUS
  Filled 2012-11-29: qty 1

## 2012-11-29 MED ORDER — LAMOTRIGINE 150 MG PO TABS
150.0000 mg | ORAL_TABLET | Freq: Two times a day (BID) | ORAL | Status: DC
  Administered 2012-11-29: 150 mg via ORAL
  Filled 2012-11-29: qty 1

## 2012-11-29 MED ORDER — KETOROLAC TROMETHAMINE 30 MG/ML IJ SOLN
30.0000 mg | Freq: Once | INTRAMUSCULAR | Status: AC
  Administered 2012-11-29: 30 mg via INTRAVENOUS
  Filled 2012-11-29: qty 1

## 2012-11-29 MED ORDER — SIMVASTATIN 10 MG PO TABS: 10.0000 mg | ORAL_TABLET | Freq: Every day | ORAL | Status: DC

## 2012-11-29 MED ORDER — KETOROLAC TROMETHAMINE 60 MG/2ML IM SOLN: 60.0000 mg | Freq: Once | INTRAMUSCULAR | Status: DC

## 2012-11-29 MED ORDER — PHENYTOIN SODIUM EXTENDED 100 MG PO CAPS
100.0000 mg | ORAL_CAPSULE | Freq: Four times a day (QID) | ORAL | Status: DC
  Administered 2012-11-30 – 2012-12-01 (×6): 100 mg via ORAL
  Filled 2012-11-29 (×11): qty 1

## 2012-11-29 MED ORDER — CLONAZEPAM 1 MG PO TABS
1.0000 mg | ORAL_TABLET | Freq: Three times a day (TID) | ORAL | Status: DC
  Administered 2012-11-30 – 2012-12-01 (×5): 1 mg via ORAL
  Filled 2012-11-29 (×5): qty 1

## 2012-11-29 MED ORDER — HYDROXYZINE HCL 25 MG PO TABS
25.0000 mg | ORAL_TABLET | Freq: Four times a day (QID) | ORAL | Status: DC | PRN
  Administered 2012-11-29 – 2012-11-30 (×2): 25 mg via ORAL

## 2012-11-29 MED ORDER — LEVETIRACETAM 500 MG PO TABS
1000.0000 mg | ORAL_TABLET | Freq: Two times a day (BID) | ORAL | Status: DC
  Administered 2012-11-30 (×2): 1000 mg via ORAL
  Filled 2012-11-29 (×5): qty 2
  Filled 2012-11-29: qty 4
  Filled 2012-11-29: qty 2

## 2012-11-29 MED ORDER — NICOTINE 14 MG/24HR TD PT24
14.0000 mg | MEDICATED_PATCH | Freq: Every day | TRANSDERMAL | Status: DC
  Administered 2012-11-30 – 2012-12-01 (×2): 14 mg via TRANSDERMAL
  Filled 2012-11-29 (×3): qty 1

## 2012-11-29 MED ORDER — SODIUM CHLORIDE 0.9 % IV BOLUS (SEPSIS)
1000.0000 mL | Freq: Once | INTRAVENOUS | Status: AC
  Administered 2012-11-29: 1000 mL via INTRAVENOUS

## 2012-11-29 MED ORDER — ALUM & MAG HYDROXIDE-SIMETH 200-200-20 MG/5ML PO SUSP: 30.0000 mL | ORAL | Status: DC | PRN

## 2012-11-29 MED ORDER — LOVASTATIN ER 20 MG PO TB24: 20.0000 mg | ORAL_TABLET | Freq: Every day | ORAL | Status: DC

## 2012-11-29 MED ORDER — LISINOPRIL 10 MG PO TABS
10.0000 mg | ORAL_TABLET | Freq: Every day | ORAL | Status: DC
  Administered 2012-11-30 – 2012-12-01 (×2): 10 mg via ORAL
  Filled 2012-11-29 (×4): qty 1

## 2012-11-29 MED ORDER — MAGNESIUM HYDROXIDE 400 MG/5ML PO SUSP: 30.0000 mL | Freq: Every day | ORAL | Status: DC | PRN

## 2012-11-29 MED ORDER — LEVETIRACETAM 500 MG PO TABS
1000.0000 mg | ORAL_TABLET | Freq: Two times a day (BID) | ORAL | Status: DC
  Administered 2012-11-29: 1000 mg via ORAL
  Filled 2012-11-29: qty 2

## 2012-11-29 MED ORDER — LAMOTRIGINE 150 MG PO TABS
150.0000 mg | ORAL_TABLET | Freq: Two times a day (BID) | ORAL | Status: DC
  Administered 2012-11-30 – 2012-12-01 (×3): 150 mg via ORAL
  Filled 2012-11-29 (×3): qty 1
  Filled 2012-11-29: qty 6
  Filled 2012-11-29 (×3): qty 1

## 2012-11-29 MED ORDER — LISINOPRIL 10 MG PO TABS
10.0000 mg | ORAL_TABLET | Freq: Every day | ORAL | Status: DC
  Administered 2012-11-29: 10 mg via ORAL
  Filled 2012-11-29: qty 1

## 2012-11-29 NOTE — ED Notes (Signed)
Telepsych consult in progress

## 2012-11-29 NOTE — ED Notes (Signed)
Meal ordered
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
## 2012-11-29 NOTE — ED Notes (Signed)
Telepsych consult complete

## 2012-11-29 NOTE — ED Provider Notes (Signed)
History     CSN: [id number]  Arrival date & time 11/29/12  1019   First MD Initiated Contact with Patient 11/29/12 1031      Chief Complaint  Patient presents with  . Suicidal    (Consider location/radiation/quality/duration/timing/severity/associated sxs/prior treatment) HPI Comments: Patient presents with a chief complaint of depression and suicidal thoughts.  He reports that he has been struggling with depression for a while, but became suicidal yesterday.  He reports that he now has a plan to overdose on medications.  He states that his Uncle who helped raise him died yesterday, which worsened his depression.  He denies HI.  He reports multiple suicide attempts in the past.  He denies any recent alcohol or recreational drug use.  Patient was seen in the ED yesterday and expressed suicidal thoughts at that time.  Telepsych was performed then and he was cleared for discharge approximately 3 hours prior to coming to the ED today.  He reports that after he was discharged he contacted his Therapist regarding the suicidal thoughts and was told to return to the ED.  He denies any physical complaints at this time.  The history is provided by the patient.    Past Medical History  Diagnosis Date  . Traumatic brain injury   . Hypertension   . Hyperlipemia   . Bipolar 1 disorder   . Migraine   . Seizures   . Brain injury     Past Surgical History  Procedure Laterality Date  . Brain surgery    . Right arm growth plate      Family History  Problem Relation Age of Onset  . COPD Mother   . Coronary artery disease Mother   . Stroke Mother   . Diabetes Mother   . Diabetes Father     History  Substance Use Topics  . Smoking status: Current Every Day Smoker -- 1.50 packs/day    Types: Cigarettes  . Smokeless tobacco: Not on file  . Alcohol Use: No      Review of Systems  Neurological: Negative for headaches.  Psychiatric/Behavioral: Positive for suicidal ideas and dysphoric  mood. Negative for hallucinations, confusion and self-injury.  All other systems reviewed and are negative.    Allergies  Iodine; Shellfish allergy; and Iodine  Home Medications   Current Outpatient Rx  Name  Route  Sig  Dispense  Refill  . clonazePAM (KLONOPIN) 1 MG tablet   Oral   Take 1 mg by mouth 3 (three) times daily.         . haloperidol (HALDOL) 5 MG tablet   Oral   Take 1 tablet (5 mg total) by mouth 2 (two) times daily.   10 tablet   0   . lamoTRIgine (LAMICTAL) 150 MG tablet   Oral   Take 150 mg by mouth 2 (two) times daily.         Marland Kitchen levETIRAcetam (KEPPRA) 1000 MG tablet   Oral   Take 1 tablet (1,000 mg total) by mouth 2 (two) times daily.   10 tablet   0   . lisinopril-hydrochlorothiazide (PRINZIDE,ZESTORETIC) 10-12.5 MG per tablet   Oral   Take 1 tablet by mouth daily.   5 tablet   0   . lovastatin (ALTOPREV) 20 MG 24 hr tablet   Oral   Take 1 tablet (20 mg total) by mouth daily before breakfast. To help lower cholesterol   30 tablet   0   . Multiple Vitamin (MULTIVITAMIN WITH  MINERALS) TABS   Oral   Take 1 tablet by mouth daily.         . phenytoin (DILANTIN) 100 MG ER capsule   Oral   Take 1 capsule (100 mg total) by mouth 4 (four) times daily. For control of seizures with Dilantin and Keppra and Lamictal   40 capsule   0     BP 116/78  Pulse 83  Temp(Src) 97.7 F (36.5 C) (Oral)  Resp 18  SpO2 98%  Physical Exam  Nursing note and vitals reviewed. Constitutional: He appears well-developed and well-nourished. No distress.  HENT:  Head: Normocephalic and atraumatic.  Eyes: EOM are normal. Pupils are equal, round, and reactive to light.  Neck: Normal range of motion. Neck supple.  Cardiovascular: Normal rate, regular rhythm and normal heart sounds.   Pulmonary/Chest: Effort normal and breath sounds normal.  Neurological: He is alert.  Skin: Skin is warm and dry. He is not diaphoretic.  Psychiatric: His speech is normal and  behavior is normal. He exhibits a depressed mood. He expresses suicidal ideation. He expresses no homicidal ideation. He expresses suicidal plans. He expresses no homicidal plans.    ED Course  Procedures (including critical care time)  Labs Reviewed - No data to display Ct Head Wo Contrast  11/29/2012  *RADIOLOGY REPORT*  Clinical Data: Headaches.  Seizure.  CT HEAD WITHOUT CONTRAST  Technique:  Contiguous axial images were obtained from the base of the skull through the vertex without contrast.  Comparison: 09/08/2011.  Findings: Stable postoperative changes involving the facial bones and frontal bone.  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  The ventricles are normal and stable.  No extra- axial fluid collections are identified.  No acute intracranial findings or mass lesions.  The brainstem and cerebellum are normal and stable.  The bony structures are intact.  The paranasal sinuses and mastoid air cells are clear except for scattered ethmoid sinus disease. The globes are intact.  IMPRESSION: No acute intracranial findings. 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   Original Report Authenticated By: Rudie Meyer, M.D.      No diagnosis found.   11:44 AM Discussed with Dr. Ranae Palms.  He recommends getting labs and ordering the telepsych again.  MDM  Patient presents with suicidal thoughts with plan.  Labs unremarkable.  Telepsych pending.          Pascal Lux [place], PA-C 11/29/12 [phone]

## 2012-11-29 NOTE — ED Notes (Signed)
Pt states he is here with depression and suicidal thoughts of overdosing which he has done in the past.  Pt states seen at [facility] last nite and had telepsych and was referred to follow up with psychiatrist. He called his psychiatrist and referred here.  Pt is stating a recent death in the family that is increasing his depression and SI thoughts.  Pt has been placed in blue scrubs, notified house coverage, and charge nurse, and security coming to wand patient.

## 2012-11-29 NOTE — ED Notes (Signed)
Telepsych being completed.  

## 2012-11-29 NOTE — ED Notes (Signed)
Returned from CT.

## 2012-11-29 NOTE — BH Assessment (Signed)
[facility] Assessment Progress Note   Pt was accepted to [facility] by Simon to Dr. Daleen Bo.  Patient has signed his voluntary admission papers and nursing staff has been notified.  Dr. Bebe Shaggy also notified.

## 2012-11-29 NOTE — ED Notes (Signed)
Per EMS pt was seen at [facility] yesterday for c/o seizure and states he has had a headache since

## 2012-11-29 NOTE — ED Provider Notes (Signed)
Pt seen by tele-psych and cleared for d/c  Toy Baker, MD 11/29/12 [phone]

## 2012-11-29 NOTE — ED Notes (Signed)
PA at bedside.

## 2012-11-29 NOTE — ED Provider Notes (Signed)
History     CSN: [id number]  Arrival date & time 11/29/12  8295   First MD Initiated Contact with Patient 11/29/12 [phone]      Chief Complaint  Patient presents with  . Headache    (Consider location/radiation/quality/duration/timing/severity/associated sxs/prior treatment) HPI History provided by pt.   Pt presents w/ severe, constant, gradually worsening R-sided headache x 2 days.  Onset 4 hours after being discharged from ER yesterday, after being evaluated for seizure.  Has taken his relpax, excedrin and aleve w/out relief.  Associated w/ mild dizziness and severe nausea.  Denies fever, vision changes, extremity weakness/paresthesias.  H/o migraines but current pain feels different.   Denies head trauma.  Also reports that his depression has been poorly controlled w/ haldol. H/o bipolar disorder and suicide attempts.  Had SI yesterday morning, does not believe that he would act on the thoughts, but it concerns him that he is not positive about that.  Past Medical History  Diagnosis Date  . Traumatic brain injury   . Hypertension   . Hyperlipemia   . Bipolar 1 disorder   . Migraine   . Seizures   . Brain injury     Past Surgical History  Procedure Laterality Date  . Brain surgery    . Right arm growth plate      Family History  Problem Relation Age of Onset  . COPD Mother   . Coronary artery disease Mother   . Stroke Mother   . Diabetes Mother   . Diabetes Father     History  Substance Use Topics  . Smoking status: Current Every Day Smoker -- 1.50 packs/day    Types: Cigarettes  . Smokeless tobacco: Not on file  . Alcohol Use: No      Review of Systems  All other systems reviewed and are negative.    Allergies  Iodine; Shellfish allergy; and Iodine  Home Medications   Current Outpatient Rx  Name  Route  Sig  Dispense  Refill  . clonazePAM (KLONOPIN) 1 MG tablet   Oral   Take 1 mg by mouth 3 (three) times daily.         . haloperidol (HALDOL) 5 MG  tablet   Oral   Take 1 tablet (5 mg total) by mouth 2 (two) times daily.   10 tablet   0   . lamoTRIgine (LAMICTAL) 150 MG tablet   Oral   Take 150 mg by mouth 2 (two) times daily.         Marland Kitchen levETIRAcetam (KEPPRA) 1000 MG tablet   Oral   Take 1 tablet (1,000 mg total) by mouth 2 (two) times daily.   10 tablet   0   . lisinopril-hydrochlorothiazide (PRINZIDE,ZESTORETIC) 10-12.5 MG per tablet   Oral   Take 1 tablet by mouth daily.   5 tablet   0   . lovastatin (ALTOPREV) 20 MG 24 hr tablet   Oral   Take 1 tablet (20 mg total) by mouth daily before breakfast. To help lower cholesterol   30 tablet   0   . Multiple Vitamin (MULTIVITAMIN WITH MINERALS) TABS   Oral   Take 1 tablet by mouth daily.         . phenytoin (DILANTIN) 100 MG ER capsule   Oral   Take 1 capsule (100 mg total) by mouth 4 (four) times daily. For control of seizures with Dilantin and Keppra and Lamictal   40 capsule   0  BP 130/72  Pulse 68  Temp(Src) 98 F (36.7 C) (Oral)  Resp 16  Ht 5\' 10"  (1.778 m)  Wt 235 lb (106.595 kg)  BMI 33.72 kg/m2  SpO2 96%  Physical Exam  Nursing note and vitals reviewed. Constitutional: He is oriented to person, place, and time. He appears well-developed and well-nourished. No distress.  HENT:  Head: Normocephalic and atraumatic.  Eyes:  Normal appearance  Neck: Normal range of motion.  No meningeal signs  Cardiovascular: Normal rate, regular rhythm and intact distal pulses.   Pulmonary/Chest: Effort normal and breath sounds normal.  Musculoskeletal: Normal range of motion.  Neurological: He is alert and oriented to person, place, and time. No sensory deficit. Coordination normal.  CN 3-12 intact.  No nystagmus. 5/5 and equal upper and lower extremity strength.  No past pointing.     Skin: Skin is warm and dry. No rash noted.  Psychiatric: He has a normal mood and affect. His behavior is normal.    ED Course  Procedures (including critical care  time)  Labs Reviewed - No data to display No results found.   No diagnosis found.    MDM  26yo M w/ bipolar disorder and epilepsy, presents w/ non-traumatic headache.  H/o migraines but current pain feels different.  On exam, pt uncomfortable but non-toxic appearing, afebrile, no focal neuro deficits or meningeal signs.  Will treat symptomatically w/ IVF, compazine, decadron and benadryl and reassess.  Also c/o depression w/ SI.  Does not believe he will act on suicidal thoughts but it concerns him that it has come to the point where he is unsure.  I do not believe that pt is a harm to himself but will obtain telepsych consult.  4:52 AM     Pt reports no relief of headache pain w/ migraine cocktail.  Will CT head.  Upstill, PA-C to resume care.  6:34 AM       Otilio Miu, PA-C 11/29/12 1610  Otilio Miu, PA-C 11/29/12 [phone]

## 2012-11-29 NOTE — ED Notes (Signed)
Patient transported to CT 

## 2012-11-30 DIAGNOSIS — F319 Bipolar disorder, unspecified: Secondary | ICD-10-CM

## 2012-11-30 DIAGNOSIS — G40909 Epilepsy, unspecified, not intractable, without status epilepticus: Secondary | ICD-10-CM

## 2012-11-30 MED ORDER — ADULT MULTIVITAMIN W/MINERALS CH
1.0000 | ORAL_TABLET | Freq: Every day | ORAL | Status: DC
  Administered 2012-11-30 – 2012-12-01 (×2): 1 via ORAL
  Filled 2012-11-30 (×3): qty 1

## 2012-11-30 MED ORDER — LORAZEPAM 2 MG/ML IJ SOLN: 1.0000 mg | Freq: Once | INTRAMUSCULAR | Status: AC

## 2012-11-30 MED ORDER — ENSURE COMPLETE PO LIQD
237.0000 mL | Freq: Two times a day (BID) | ORAL | Status: DC
  Administered 2012-12-01: 237 mL via ORAL

## 2012-11-30 MED ORDER — LORAZEPAM 2 MG/ML IJ SOLN
INTRAMUSCULAR | Status: AC
  Filled 2012-11-30: qty 1

## 2012-11-30 MED ORDER — LEVETIRACETAM 750 MG PO TABS
1250.0000 mg | ORAL_TABLET | Freq: Two times a day (BID) | ORAL | Status: DC
  Administered 2012-12-01: 1250 mg via ORAL
  Filled 2012-11-30 (×3): qty 1

## 2012-11-30 MED ORDER — LORAZEPAM 2 MG/ML IJ SOLN
INTRAMUSCULAR | Status: AC
  Administered 2012-11-30: 1 mg via INTRAMUSCULAR
  Filled 2012-11-30: qty 1

## 2012-11-30 MED ORDER — HYDROCODONE-ACETAMINOPHEN 5-325 MG PO TABS
2.0000 | ORAL_TABLET | Freq: Once | ORAL | Status: AC
  Administered 2012-11-30: 2 via ORAL
  Filled 2012-11-30: qty 2

## 2012-11-30 MED ORDER — HALOPERIDOL 5 MG PO TABS
5.0000 mg | ORAL_TABLET | Freq: Two times a day (BID) | ORAL | Status: DC
  Administered 2012-11-30 – 2012-12-01 (×2): 5 mg via ORAL
  Filled 2012-11-30 (×6): qty 1

## 2012-11-30 MED ORDER — SIMVASTATIN 5 MG PO TABS
5.0000 mg | ORAL_TABLET | Freq: Every day | ORAL | Status: DC
  Administered 2012-11-30: 5 mg via ORAL
  Filled 2012-11-30 (×2): qty 1
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
## 2012-11-30 NOTE — ED Provider Notes (Signed)
Medical screening examination/treatment/procedure(s) were performed by non-physician practitioner and as supervising physician I was immediately available for consultation/collaboration.  Willadean Guyton T Laisha Rau, MD 11/30/12 0311 

## 2012-11-30 NOTE — Progress Notes (Addendum)
D: Patient denies HI and auditory and visual hallucinations. The patient is positive for SI but verbally contracts for safety. The patient has an anxious mood and affect. The patient rates his depression and hopelessness both an 8 out of 10 (1 low/10 high). The patient reports sleeping poorly and states that his appetite is poor and that his energy level is low. The patient states that he "just wants to rest today" because he arrived "early this morning."   A: Patient given emotional support from RN. Patient encouraged to come to staff with concerns and/or questions. Patient's medication routine continued. Patient's orders and plan of care reviewed.  R: Patient remains cooperative. Will continue to monitor patient q15 minutes for safety.

## 2012-11-30 NOTE — Tx Team (Signed)
Initial Interdisciplinary Treatment Plan  PATIENT STRENGTHS: (choose at least two) Ability for insight Active sense of humor Capable of independent living Communication skills Motivation for treatment/growth Supportive family/friends  PATIENT STRESSORS: Health problems Medication change or noncompliance Substance abuse   PROBLEM LIST: Problem List/Patient Goals Date to be addressed Date deferred Reason deferred Estimated date of resolution  "I need med stabilization" 11/29/2012     "I want to get rid of the bad thoughts 11/29/2012                                                DISCHARGE CRITERIA:  Ability to meet basic life and health needs Improved stabilization in mood, thinking, and/or behavior Motivation to continue treatment in a less acute level of care Need for constant or close observation no longer present  PRELIMINARY DISCHARGE PLAN: Attend aftercare/continuing care group Outpatient therapy Return to previous living arrangement  PATIENT/FAMIILY INVOLVEMENT: This treatment plan has been presented to and reviewed with the patient, Arthur Singleton.  The patient and family have been given the opportunity to ask questions and make suggestions.  Angeline Slim M 11/30/2012, 12:11 AM
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
## 2012-11-30 NOTE — Progress Notes (Signed)
S pt 27 y/o WM with suspected pseudo seizures, with unobserved per nursing staff brief LOC with patient lying on the floor @ approx 9pm. Pt revived on his own by verbal stimulation. Pt is without concerns of loss of bowel or bladder, nausea, diaphoresis, vertigo, diplopia or CP sx at this time. Pt also denies any pre seizure auras.         V/S: BP 166/91, hr 65, SAT 100% WITH RESP 20 O: Integument: W/D non diaphoretic      HEENT: normocephalic, atraumatic, PERRLA     Pulm: CTA     Cardio: audible s1,s2 without M/G/R     Neuro: CN 2 - 12 intact, Pt A&O x 3 spheres     M/S: no rigidity and or spasticity or upper and or lower extremities bilat       A/P: clinical impression c/w pseudo seizure, no focal neurological deficits and or classic post ictal sx noted. Recommended pt to go to his room for safety and closer observation but pt declines and wants to participate with fellow patients and ambulate amongst the hallways and day room. Pt requested Vicodin and ativan, both declined as patient clinically didn't have a seizure and both could potentiate AMS changes and patient instability.

## 2012-11-30 NOTE — [facility] Group Notes (Signed)
[facility] LCSW Group Therapy      Emotional Regulation 1:15 - 2:30 PM         11/30/2012 2:51 PM  Type of Therapy:  Group Therapy  Participation Level:  Active  Participation Quality:  Appropriate  Affect:  Appropriate and Defensive  Cognitive:  Appropriate  Insight:  Developing/Improving and Engaged  Engagement in Therapy:  Developing/Improving and Engaged  Modes of Intervention:  Discussion, Education, Exploration, Problem-Solving, Rapport Building, Support  Summary of Progress/Problems:  Patient advised the emotion he has to control is anger when feeling he has been disrespected.  Patient went into a seizure and was unable to complete group.  Wynn Banker 11/30/2012, 2:51 PM

## 2012-11-30 NOTE — Progress Notes (Signed)
27 y/o male who presents voluntarily for suicidal ideations.  Patient states he has a history of seizures and recent had a seizure 11/28/2012.  Patient states he went to the emergency room but states he was discharged.  Patient states he went back to the ED for suicidal ideations plan to OD but did not attempt.  Patient also states he metabolized Dilantin quickly so his levels have been low so patient states he needed his epileptic medications adjusted.  Patient states he lives in [facility] but was in [place] visiting his partner.  Patient currently states he is having passive SI denies HI and denies AVH.  Patient verbally contracts for safety.  Patient states he has seizures, hyperlipemia, htn, anxiety, depression, TBI 14 years ago.  Patient states he currently smokes cigarettes 1 pack per day.  Consents obtained fall safety plan reviewed and patient verbalized understanding.  Food and fluids offered and patient accepted both.  Patient belongings secured in locker #34.

## 2012-11-30 NOTE — Progress Notes (Signed)
Staff was notified by patients on the hallway that patient was having a "seizure."  Staff came into the 500 hall dayroom patient was observed lying on the floor on his back eyes closed and still.  Writer called patient's name and he moaned.  Patient has palpable pulse 60 manually at this time. Patient pupils equal and reactive.  Patient VS documented.  Patient opened his eyes and began to talk with staff but stuttering.  Patient states he normally takes Vicodin and ativan after a seizure.  These medications were not given and patient was escorted to his room by staff.  Patient was informed by staff for safety that he should rest in bed.  Patient became upset and began to raise his voice and stuttering subsided.    Patient became irate and states he should not have to stay in his room.  Writer explained to patient that he did not have to stay in his room it was just a suggestion and recommended for patient safety.  Patient notified that staff would do 15 min checks plus additional checks but patient was not ok with this.  Patient then wanted to know what medication he would be getting for his seizures and writer administered patient his scheduled dose of Dilantin.  Patient taking administered medication with no problem.  Patient then demanded to use the phone in the phone room.  Patient ambulated to phone room with no problem.  Patient irrate stating he is going to call his mother and his lawyer because he does not like the care he has received since he has been here.  Patient in phone room using the phone.  Patient comes out of phone room walking about the unit in dayroom and hallway.  Patient states he has contacted his lawyer, [employer], and disability services.  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   Patient writer did not talk to patient about this.  Patient wanted to increase his fluid intake and he was given a cup of Gatorade to drink.  Patient states he does not want Thurman Coyer around him but has no reason.  **Other patients on the unit states they observed patient sit on the floor and tell them he was about to have a seizure.  One patient stated "I believe he was faking,  Patient was talking and shaking and then he stopped."  Another patient states, "He did this earlier and when people ignored him he stated again seizure.

## 2012-11-30 NOTE — Progress Notes (Signed)
Adult Psychoeducational Group Note  Date:  11/30/2012 Time:  8:35 PM  Group Topic/Focus:  Wrap-Up Group:   The focus of this group is to help patients review their daily goal of treatment and discuss progress on daily workbooks.  Participation Level:  Active  Participation Quality:  Appropriate  Affect:  Appropriate  Cognitive:  Alert  Insight: Appropriate  Engagement in Group:  Engaged  Modes of Intervention:  Discussion  Additional Comments:  Pt stated that he did not have a really good day. He talked about the seizure that he had early and that he feels that this facility is not where he needs to be so he will be discharging tomorrow.  Kaleen Odea R 11/30/2012, 8:35 PM

## 2012-11-30 NOTE — Progress Notes (Signed)
Adult Psychoeducational Group Note  Date:  11/30/2012 Time:  2:24 PM  Group Topic/Focus:  Personal Choices and Values:   The focus of this group is to help patients assess and explore the importance of values in their lives, how their values affect their decisions, how they express their values and what opposes their expression.  Participation Level:  None  Participation Quality:  Inattentive  Affect:  Flat  Cognitive:  Appropriate  Insight: None  Engagement in Group:  None  Modes of Intervention:  Discussion  Additional Comments:  : Pt attended the last few minutes. Pt did not share and remained quiet throughout group.    Sharyn Lull 11/30/2012, 2:24 PM
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
## 2012-11-30 NOTE — Progress Notes (Signed)
Psychoeducational Group Note  Date:  11/30/2012 Time:  1000  Group Topic/Focus:  Therapeutic Activity- "Apples to Apples"  Participation Level: Did Not Attend  Participation Quality:  Not Applicable  Affect:  Not Applicable  Cognitive:  Not Applicable  Insight:  Not Applicable  Engagement in Group: Not Applicable  Additional Comments:  Pt remained resting in bed during this group.   Sharyn Lull 11/30/2012, 1:25 PM
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
## 2012-11-30 NOTE — Progress Notes (Signed)
D: Patient in the dayroom on approach.  Patient came to speak to writer in the hall way.  Patient states he had a bad day.  Patient states he is going to be discharged tomorrow due to his dislike for the care he has received at [facility].  Patient states he has been hospitalized many times and this has been his worst hospitalization.  Patient states I do not meet criteria so I should be discharged or transferred elsewhere.  Patient states I will never come back to [facility].  Patient denies SI/HI and denies AVH. A: Staff to monitor Q 15 mins for safety.  Encouragement and support offered.  Scheduled medications administered per orders. Vistaril administered prn for anxiety. R: Patient remains safe on the unit.  Patient taking administered medications.  Patient irritable, taking administered medications.

## 2012-11-30 NOTE — Progress Notes (Signed)
Nutrition Brief Note Patient identified on the Malnutrition Screening Tool (MST) Report.  Wt Readings from Last 10 Encounters:  11/29/12 236 lb (107.049 kg)  11/29/12 235 lb (106.595 kg)  09/11/11 260 lb (117.935 kg)  09/08/11 254 lb (115.214 kg)  08/26/11 254 lb (115.214 kg)  08/05/11 154 lb (69.854 kg)  08/05/11 240 lb (108.863 kg)   Body mass index is 36.18 kg/(m^2). Patient meets criteria for obesity grade 2 based on current BMI.   Discussed intake PTA with patient and compared to intake presently.  Discussed changes in intake, if any, and encouraged adequate intake of meals and snacks. Current diet order is regular and pt is also offered choice of unit snacks mid-morning and mid-afternoon.  Pt is eating as desired.   Labs and medications reviewed.   Nutrition Dx:  Unintended wt change r/t suboptimal oral intake AEB pt report  Patient reports decreased appetite for the past 2 weeks.  UBW 246 lbs.  Patient reports a 10 lb weight loss in the last 2 weeks.   Secondary to decreased appetite and intake secondary to recurrent seizures adn depression.  Interventions:   Discussed the importance of nutrition and encouraged intake of food and beverages.     Discussed weight goals with patient.   Supplements:   Will add Ensure Complete bid and MVI daily.  No additional nutrition interventions warranted at this time. If nutrition issues arise, please consult RD.

## 2012-12-01 DIAGNOSIS — F1910 Other psychoactive substance abuse, uncomplicated: Secondary | ICD-10-CM

## 2012-12-01 DIAGNOSIS — F332 Major depressive disorder, recurrent severe without psychotic features: Principal | ICD-10-CM

## 2012-12-01 LAB — PHENYTOIN LEVEL, TOTAL: Phenytoin Lvl: 7.7 ug/mL — ABNORMAL LOW (ref 10.0–20.0)

## 2012-12-01 MED ORDER — LEVETIRACETAM 250 MG PO TABS: 1250.0000 mg | ORAL_TABLET | Freq: Two times a day (BID) | ORAL | Status: DC

## 2012-12-01 MED ORDER — LISINOPRIL-HYDROCHLOROTHIAZIDE 10-12.5 MG PO TABS: 1.0000 | ORAL_TABLET | Freq: Every day | ORAL | Status: DC

## 2012-12-01 MED ORDER — PHENYTOIN SODIUM EXTENDED 100 MG PO CAPS: 100.0000 mg | ORAL_CAPSULE | Freq: Four times a day (QID) | ORAL | Status: DC

## 2012-12-01 MED ORDER — LOVASTATIN ER 20 MG PO TB24: 20.0000 mg | ORAL_TABLET | Freq: Every day | ORAL | Status: DC

## 2012-12-01 MED ORDER — CLONAZEPAM 1 MG PO TABS: 1.0000 mg | ORAL_TABLET | Freq: Three times a day (TID) | ORAL | Status: DC

## 2012-12-01 MED ORDER — HALOPERIDOL 5 MG PO TABS: 5.0000 mg | ORAL_TABLET | Freq: Two times a day (BID) | ORAL | Status: DC

## 2012-12-01 MED ORDER — LAMOTRIGINE 150 MG PO TABS: 150.0000 mg | ORAL_TABLET | Freq: Two times a day (BID) | ORAL | Status: DC

## 2012-12-01 NOTE — BHH Group Notes (Signed)
[facility] LCSW Aftercare Discharge Planning Group Note   12/01/2012 11:31 AM  Participation Quality:  Appropriate  Mood/Affect:  Appropriate  Depression Rating:  2  Anxiety Rating:  5  Thoughts of Suicide:  No  Will you contract for safety?   NA  Current AVH:  No  Plan for Discharge/Comments:  Patient reports being better today and looks forward to discharging home.  He is asking to leave as early as possible.  Transportation Means: Patient assisted with bus pass and PART fare.  Supports:  Patient has a good support system.   Loralye Loberg, Joesph July

## 2012-12-01 NOTE — Progress Notes (Signed)
Patient ID: Arthur Singleton, male   DOB: 1985-11-02, 27 y.o.   MRN: [id number] He has been discharged home was provided bus passes and money for [employer]. He voiced understanding of discharge plan and of follow up plan. He has not voiced or indicated symptom of a seizure, Stated that he felt fine and that he was going to his friends home.  He denies thoughts of harming self or others. All belongings were taken home with him.
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
## 2012-12-01 NOTE — Progress Notes (Addendum)
[facility] Adult Case Management Discharge Plan :  Will you be returning to the same living situation after discharge: Yes,  Patient will return to his home. At discharge, do you have transportation home?:Yes,  Patient to be assisted with bus pass and PART fare. Do you have the ability to pay for your medications:Yes,  Patient has Medicare and Medicaid.  Release of information consent forms completed and in the chart;  Patient's signature needed at discharge.  Patient to Follow up at:  [facility]              [phone]  Monday, Dec 05, 2012 between 9AM - 11AM.   Patient are seen on a first come first served basis.  Please arrive as early as possible  [address] [place], [place]    [address]   Patient denies SI/HI:   Patient no longer endorsing SI/HI or other thoughts of self harm.     Safety Planning and Suicide Prevention discussed:.Reviewed with all patients during discharge planning group  Jamileth Putzier, Joesph July 12/01/2012, 11:19 AM
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
## 2012-12-01 NOTE — BHH Suicide Risk Assessment (Signed)
BHH INPATIENT:  Family/Significant Other Suicide Prevention Education  Suicide Prevention Education:  Education Completed; Arthur Singleton, Father, [phone]; has been identified by the patient as the family member/significant other with whom the patient will be residing, and identified as the person(s) who will aid the patient in the event of a mental health crisis (suicidal ideations/suicide attempt).  With written consent from the patient, the family member/significant other has been provided the following suicide prevention education, prior to the and/or following the discharge of the patient.  The suicide prevention education provided includes the following:  Suicide risk factors  Suicide prevention and interventions  National Suicide Hotline telephone number  [facility] assessment telephone number  [facility] Emergency Assistance 911  [facility] and/or Residential Mobile Crisis Unit telephone number  Request made of family/significant other to:  Remove weapons (e.g., guns, rifles, knives), all items previously/currently identified as safety concern.  Father advised patient does not have access to guns.  Remove drugs/medications (over-the-counter, prescriptions, illicit drugs), all items previously/currently identified as a safety concern.  The family member/significant other verbalizes understanding of the suicide prevention education information provided.  The family member/significant other agrees to remove the items of safety concern listed above.  Arthur Singleton 12/01/2012, 11:04 AM

## 2012-12-01 NOTE — ED Provider Notes (Signed)
Medical screening examination/treatment/procedure(s) were performed by non-physician practitioner and as supervising physician I was immediately available for consultation/collaboration.   Siaosi Alter, MD 12/01/12 1534 

## 2012-12-01 NOTE — BHH Suicide Risk Assessment (Signed)
Suicide Risk Assessment  Discharge Assessment     Demographic Factors:  Male, Caucasian, Low socioeconomic status and Unemployed  Mental Status Per Nursing Assessment::   On Admission:  Suicidal ideation indicated by patient;Self-harm thoughts  Current Mental Status by Physician: Patient alert and oriented to 4. Denies aH/Vh/SI/HI.  Loss Factors: Decrease in vocational status and Decline in physical health  Historical Factors: Impulsivity  Risk Reduction Factors:   Positive social support and Positive coping skills or problem solving skills  Continued Clinical Symptoms:  Depression:   Recent sense of peace/wellbeing  Cognitive Features That Contribute To Risk:  Thought constriction (tunnel vision)    Suicide Risk:  Minimal: No identifiable suicidal ideation.  Patients presenting with no risk factors but with morbid ruminations; may be classified as minimal risk based on the severity of the depressive symptoms  Discharge Diagnoses:   AXIS I:  Major Depression, Recurrent severe and Substance Abuse AXIS II:  Deferred AXIS III:   Past Medical History  Diagnosis Date  . Traumatic brain injury   . Hypertension   . Hyperlipemia   . Bipolar 1 disorder   . Migraine   . Seizures   . Brain injury   . Anxiety    AXIS IV:  other psychosocial or environmental problems AXIS V:  61-70 mild symptoms  Plan Of Care/Follow-up recommendations:  Activity:  as tolerated Diet:  regular Follow up with outpatient appointments.  Is patient on multiple antipsychotic therapies at discharge:  No   Has Patient had three or more failed trials of antipsychotic monotherapy by history:  No  Recommended Plan for Multiple Antipsychotic Therapies: NA  Meleana Commerford 12/01/2012, 10:23 AM

## 2012-12-01 NOTE — Plan of Care (Signed)
Problem: Alteration in mood & ability to function due to Goal: LTG-Patient demonstrates decreased signs of withdrawal (Patient demonstrates decreased signs of withdrawal to the point the patient is safe to return home and continue treatment in an outpatient setting)  Outcome: Completed/Met Date Met:  12/01/12 Patient is not endorsing signs/symptoms of withdrawal.  .Arthur Nazzaro, LCSW 12/01/2012

## 2012-12-01 NOTE — Progress Notes (Signed)
Adult Psychoeducational Group Note  Date:  12/01/2012 Time:  10:54 AM  Group Topic/Focus:  Building Self Esteem:   The Focus of this group is helping patients become aware of the effects of self-esteem on their lives, the things they and others do that enhance or undermine their self-esteem, seeing the relationship between their level of self-esteem and the choices they make and learning ways to enhance self-esteem.  Participation Level:  Did Not Attend  Participation Quality:  None  Affect:  N/A  Cognitive:  N/A  Insight: None  Engagement in Group:  N/A  Modes of Intervention:  N/A  Additional Comments:  Patient did not attend. Patient stated that he wanted to rest.  Nestor Ramp [facility] 12/01/2012, 10:54 AM

## 2012-12-01 NOTE — Plan of Care (Signed)
Problem: Ineffective individual coping Goal: STG: Patient will participate in after care plan Patient attended groups and talked about problems.  Arthur Porteous Linus Weckerly, Arthur Singleton 12/01/2012 Outcome: Completed/Met Date Met:  12/01/12 Patient attended groups and engaged in discussions.  He will be followed by [facility] for outpatient services.  Arthur Porteous Nicholas Trompeter, Arthur Singleton 12/01/2012

## 2012-12-01 NOTE — Plan of Care (Signed)
Problem: Alteration in mood Goal: LTG-Pt's behavior demonstrates decreased signs of depression Patient currently rating symptoms at four or below .Rishikesh Khachatryan, LCSW 12/01/2012 Outcome: Completed/Met Date Met:  12/01/12 Patient is rating depression at two and reports being safe to discharge home.  Horace Porteous Maylynn Orzechowski, LCSW 12/01/2012

## 2012-12-05 NOTE — Progress Notes (Signed)
Patient Discharge Instructions:  After Visit Summary (AVS):   Faxed to:  12/05/12 Discharge Summary Note:   Faxed to:  12/05/12 Psychiatric Admission Assessment Note:   Faxed to:  12/05/12 Suicide Risk Assessment - Discharge Assessment:   Faxed to:  12/05/12 Faxed/Sent to the Next Level Care provider:  12/05/12 Faxed to [facility] @ [phone]  Jerelene Redden, 12/05/2012, 3:53 PM

## 2012-12-16 ENCOUNTER — Emergency Department (HOSPITAL_COMMUNITY)
Admission: EM | Admit: 2012-12-16 | Discharge: 2012-12-16 | Disposition: A | Discharge status: Home / Self Care | Payer: Medicare Other | Attending: Emergency Medicine | Admitting: Emergency Medicine

## 2012-12-16 ENCOUNTER — Encounter (HOSPITAL_COMMUNITY): Payer: Self-pay | Admitting: Emergency Medicine

## 2012-12-16 DIAGNOSIS — G43909 Migraine, unspecified, not intractable, without status migrainosus: Secondary | ICD-10-CM | POA: Insufficient documentation

## 2012-12-16 DIAGNOSIS — F319 Bipolar disorder, unspecified: Secondary | ICD-10-CM | POA: Insufficient documentation

## 2012-12-16 DIAGNOSIS — Z79899 Other long term (current) drug therapy: Secondary | ICD-10-CM | POA: Insufficient documentation

## 2012-12-16 DIAGNOSIS — R791 Abnormal coagulation profile: Secondary | ICD-10-CM | POA: Insufficient documentation

## 2012-12-16 DIAGNOSIS — E785 Hyperlipidemia, unspecified: Secondary | ICD-10-CM | POA: Insufficient documentation

## 2012-12-16 DIAGNOSIS — I10 Essential (primary) hypertension: Secondary | ICD-10-CM | POA: Insufficient documentation

## 2012-12-16 DIAGNOSIS — R7889 Finding of other specified substances, not normally found in blood: Secondary | ICD-10-CM

## 2012-12-16 DIAGNOSIS — G40909 Epilepsy, unspecified, not intractable, without status epilepticus: Secondary | ICD-10-CM | POA: Insufficient documentation

## 2012-12-16 DIAGNOSIS — F17200 Nicotine dependence, unspecified, uncomplicated: Secondary | ICD-10-CM | POA: Insufficient documentation

## 2012-12-16 DIAGNOSIS — F411 Generalized anxiety disorder: Secondary | ICD-10-CM | POA: Insufficient documentation

## 2012-12-16 DIAGNOSIS — Z87820 Personal history of traumatic brain injury: Secondary | ICD-10-CM | POA: Insufficient documentation

## 2012-12-16 HISTORY — DX: Unspecified convulsions: R56.9

## 2012-12-16 HISTORY — DX: Personality disorder, unspecified: F60.9

## 2012-12-16 HISTORY — DX: Malingerer (conscious simulation): Z76.5

## 2012-12-16 HISTORY — DX: Conversion disorder with seizures or convulsions: F44.5

## 2012-12-16 HISTORY — DX: Personal history of other medical treatment: Z92.89

## 2012-12-16 LAB — CBC WITH DIFFERENTIAL/PLATELET
Basophils Relative: 1 % (ref 0–1)
Eosinophils Absolute: 0.2 10*3/uL (ref 0.0–0.7)
Hemoglobin: 14.3 g/dL (ref 13.0–17.0)
Lymphs Abs: 2.4 10*3/uL (ref 0.7–4.0)
MCH: 30.5 pg (ref 26.0–34.0)
Monocytes Relative: 9 % (ref 3–12)
Neutro Abs: 2.9 10*3/uL (ref 1.7–7.7)
Neutrophils Relative %: 49 % (ref 43–77)
Platelets: 195 10*3/uL (ref 150–400)
RBC: 4.69 MIL/uL (ref 4.22–5.81)
WBC: 6 10*3/uL (ref 4.0–10.5)

## 2012-12-16 LAB — COMPREHENSIVE METABOLIC PANEL
ALT: 47 U/L (ref 0–53)
Albumin: 4 g/dL (ref 3.5–5.2)
BUN: 12 mg/dL (ref 6–23)
Calcium: 9.4 mg/dL (ref 8.4–10.5)
GFR calc Af Amer: >90 mL/min (ref >90)
Glucose, Bld: 92 mg/dL (ref 70–99)
Sodium: 140 mEq/L (ref 135–145)
Total Protein: 6.8 g/dL (ref 6.0–8.3)

## 2012-12-16 LAB — PHENYTOIN LEVEL, TOTAL
Phenytoin Lvl: 8.5 ug/mL — ABNORMAL LOW (ref 10.0–20.0)
Phenytoin Lvl: 8.4 ug/mL — ABNORMAL LOW (ref 10.0–20.0)

## 2012-12-16 LAB — GLUCOSE, CAPILLARY: Glucose-Capillary: 90 mg/dL (ref 70–99)

## 2012-12-16 MED ORDER — LORAZEPAM 1 MG PO TABS
1.0000 mg | ORAL_TABLET | Freq: Once | ORAL | Status: AC
  Administered 2012-12-16: 1 mg via ORAL
  Filled 2012-12-16: qty 1

## 2012-12-16 MED ORDER — PHENYTOIN 50 MG PO CHEW
200.0000 mg | CHEWABLE_TABLET | Freq: Once | ORAL | Status: AC
  Administered 2012-12-16: 200 mg via ORAL
  Filled 2012-12-16: qty 4

## 2012-12-16 MED ORDER — LEVETIRACETAM 750 MG PO TABS
1500.0000 mg | ORAL_TABLET | Freq: Once | ORAL | Status: DC
  Filled 2012-12-16: qty 2

## 2012-12-16 MED ORDER — LEVETIRACETAM 750 MG PO TABS
1250.0000 mg | ORAL_TABLET | Freq: Once | ORAL | Status: AC
  Administered 2012-12-16: 1250 mg via ORAL
  Filled 2012-12-16: qty 1
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
## 2012-12-16 NOTE — ED Notes (Signed)
Pt aware of the need for a urine sample. Pt states " I already gave a sample at [place] and they said everything is fine."

## 2012-12-16 NOTE — ED Notes (Signed)
EDP Arthur Singleton made aware the pt refused arterial stick. Pt stated "I will let lab stick in vein but not arterial".   Lab notified that need blood draw for specimens.

## 2012-12-16 NOTE — ED Notes (Addendum)
Made EDP [place] aware that blood specimen IV team was hemolyzed and unable to use. EDP placeing new orders for arterial stick for blood specimens.

## 2012-12-16 NOTE — Progress Notes (Signed)
RT Note: MD ordered an arterial stick for labs. Patient angrily refused to be stuck arterially for blood. Patients RN, Lyla Son was at bedside as well as another RT, [place]. RN will notify MD.

## 2012-12-16 NOTE — ED Notes (Signed)
Two RNs have attempted IV access but unsuccessful. IV team notified.

## 2012-12-16 NOTE — ED Notes (Addendum)
Per EMS pt was seen at [facility] yesterday for seizures.  Started having seizures again today, per EMS has had a total of 5 grand mal seizures this AM.  Pt has PMH TBI since age 27.   EMS admin total 5mg  versed intranasal in route

## 2012-12-16 NOTE — ED Notes (Signed)
JWJ:[id number]<NW> Expected date:<BR> Expected time:<BR> Means of arrival:<BR> Comments:<BR> EMS - seizure

## 2012-12-16 NOTE — ED Notes (Signed)
CBG registered 90 on ED Glucometer

## 2012-12-17 ENCOUNTER — Emergency Department (HOSPITAL_COMMUNITY)
Admission: EM | Admit: 2012-12-17 | Discharge: 2012-12-17 | Disposition: A | Discharge status: Home / Self Care | Payer: Medicare Other | Attending: Emergency Medicine | Admitting: Emergency Medicine

## 2012-12-17 ENCOUNTER — Encounter (HOSPITAL_COMMUNITY): Payer: Self-pay | Admitting: *Deleted

## 2012-12-17 DIAGNOSIS — Z79899 Other long term (current) drug therapy: Secondary | ICD-10-CM | POA: Insufficient documentation

## 2012-12-17 DIAGNOSIS — F319 Bipolar disorder, unspecified: Secondary | ICD-10-CM | POA: Insufficient documentation

## 2012-12-17 DIAGNOSIS — G40909 Epilepsy, unspecified, not intractable, without status epilepticus: Secondary | ICD-10-CM | POA: Insufficient documentation

## 2012-12-17 DIAGNOSIS — F411 Generalized anxiety disorder: Secondary | ICD-10-CM | POA: Insufficient documentation

## 2012-12-17 DIAGNOSIS — Z8679 Personal history of other diseases of the circulatory system: Secondary | ICD-10-CM | POA: Insufficient documentation

## 2012-12-17 DIAGNOSIS — F17200 Nicotine dependence, unspecified, uncomplicated: Secondary | ICD-10-CM | POA: Insufficient documentation

## 2012-12-17 DIAGNOSIS — Z87820 Personal history of traumatic brain injury: Secondary | ICD-10-CM | POA: Insufficient documentation

## 2012-12-17 DIAGNOSIS — I10 Essential (primary) hypertension: Secondary | ICD-10-CM | POA: Insufficient documentation

## 2012-12-17 DIAGNOSIS — Z8659 Personal history of other mental and behavioral disorders: Secondary | ICD-10-CM | POA: Insufficient documentation

## 2012-12-17 DIAGNOSIS — E785 Hyperlipidemia, unspecified: Secondary | ICD-10-CM | POA: Insufficient documentation

## 2012-12-17 DIAGNOSIS — R569 Unspecified convulsions: Secondary | ICD-10-CM

## 2012-12-17 MED ORDER — LORAZEPAM 1 MG PO TABS
1.0000 mg | ORAL_TABLET | Freq: Once | ORAL | Status: AC
Start: 2012-12-17 — End: 2012-12-17
  Administered 2012-12-17: 1 mg via ORAL
  Filled 2012-12-17 (×2): qty 1

## 2012-12-17 MED ORDER — LEVETIRACETAM 750 MG PO TABS
1500.0000 mg | ORAL_TABLET | Freq: Once | ORAL | Status: AC
  Administered 2012-12-17: 1500 mg via ORAL
  Filled 2012-12-17 (×2): qty 2

## 2012-12-17 MED ORDER — LEVETIRACETAM 500 MG PO TABS: 1500.0000 mg | ORAL_TABLET | Freq: Two times a day (BID) | ORAL | Status: DC

## 2012-12-17 NOTE — ED Notes (Signed)
Seizure pads placed on side rails

## 2012-12-17 NOTE — ED Notes (Signed)
Went to [facility] for seizures via ems; "[facility] did nothing." today, x 2 seizures today. Aura is flashing lights, metallic tastes in mouth. Seizures were 1500 and 2000. Pt. Alert and oriented, maew.

## 2012-12-17 NOTE — ED Notes (Signed)
Pt has ride home.

## 2012-12-17 NOTE — ED Provider Notes (Signed)
History     CSN: [id number]  Arrival date & time 12/17/12  2206   First MD Initiated Contact with Patient 12/17/12 2220      Chief Complaint  Patient presents with  . Seizures    The history is provided by the patient.  hx of seizure x 2 today. Long standing hx of seizures on dilantin, keppra, lamictal and klonopin. States compliance with meds. Dilantin level 8.5 yesterday at [facility]. Orally loaded yesterday in the ER. 10 visits in the last 6 months. 3 ER visits including [facility] in the past several days. No fevers or chills. No HA now. Preceding aura. No CP or SOB. No n/v/d/. Nothing worsens or improves symptoms. Feels back to normal at this time     Past Medical History  Diagnosis Date  . Traumatic brain injury   . Hypertension   . Hyperlipemia   . Bipolar 1 disorder   . Migraine   . Seizures   . Brain injury   . Anxiety   . History of electroencephalogram 08/2011    normal EEG  . Pseudoseizures     questioned after normal EEG 08/2011, staging "seizure" to obtain narcotic pain meds  . Drug-seeking behavior   . 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 Malingering     Past Surgical History  Procedure Laterality Date  . Right arm growth plate    . Brain surgery      partial frontal lobectomy  . Lobectomy      Family History  Problem Relation Age of Onset  . COPD Mother   . Coronary artery disease Mother   . Stroke Mother   . Diabetes Mother   . Diabetes Father     History  Substance Use Topics  . Smoking status: Current Every Day Smoker -- 1.00 packs/day    Types: Cigarettes  . Smokeless tobacco: Not on file  . Alcohol Use: No      Review of Systems  Neurological: Positive for seizures.  All other systems reviewed and are negative.    Allergies  Iodine; Shellfish allergy; and Iodine  Home Medications   Current Outpatient Rx  Name  Route  Sig  Dispense  Refill  . clonazePAM (KLONOPIN) 1 MG tablet   Oral   Take 1 tablet (1  mg total) by mouth 3 (three) times daily. For anxiety and seizures   30 tablet      . haloperidol (HALDOL) 5 MG tablet   Oral   Take 1 tablet (5 mg total) by mouth 2 (two) times daily. For mood control   60 tablet   0   . lamoTRIgine (LAMICTAL) 100 MG tablet   Oral   Take 100 mg by mouth 2 (two) times daily.         Marland Kitchen levETIRAcetam (KEPPRA) 500 MG tablet   Oral   Take 1,000 mg by mouth every 12 (twelve) hours.         Marland Kitchen lisinopril-hydrochlorothiazide (PRINZIDE,ZESTORETIC) 10-12.5 MG per tablet   Oral   Take 1 tablet by mouth daily.   5 tablet   0   . lovastatin (ALTOPREV) 20 MG 24 hr tablet   Oral   Take 1 tablet (20 mg total) by mouth daily before breakfast. To help lower cholesterol   30 tablet   0   . Multiple Vitamin (MULTIVITAMIN WITH MINERALS) TABS   Oral   Take 1 tablet by mouth daily.         Marland Kitchen  phenytoin (DILANTIN) 100 MG ER capsule   Oral   Take 100 mg by mouth 3 (three) times daily. For control of seizures with Dilantin and Keppra and Lamictal           BP 121/74  Pulse 94  Temp(Src) 98.6 F (37 C) (Oral)  Resp 16  SpO2 94%  Physical Exam  Nursing note and vitals reviewed. Constitutional: He is oriented to person, place, and time. He appears well-developed and well-nourished.  HENT:  Head: Normocephalic and atraumatic.  Eyes: EOM are normal. Pupils are equal, round, and reactive to light.  Neck: Normal range of motion.  Cardiovascular: Normal rate, regular rhythm, normal heart sounds and intact distal pulses.   Pulmonary/Chest: Effort normal and breath sounds normal. No respiratory distress.  Abdominal: Soft. He exhibits no distension. There is no tenderness.  Genitourinary: Rectum normal.  Musculoskeletal: Normal range of motion.  Neurological: He is alert and oriented to person, place, and time.  5/5 strength in major muscle groups of  bilateral upper and lower extremities. Speech normal. No facial asymetry.   Skin: Skin is warm and  dry.  Psychiatric: He has a normal mood and affect. Judgment normal.    ED Course  Procedures (including critical care time)  Labs Reviewed - No data to display No results found.   1. Seizure       MDM  Pt with pronlonged hx of seizure disorder will increase his keppra to 1500 mg BID given recurrence of seizures. Labs checked yesterday. Neuro follow up next week with his neurologist.         Lyanne Co, MD 12/17/12 2342

## 2013-01-09 IMAGING — CT CT HEAD W/O CM
2 of 4 series · 16 of 30 positions shown, 19 images · non-contrast
Comparison: 10/29/2009

CLINICAL DATA: Migraine headaches.  To possible seizures.  History
of right frontal brain surgery post-traumatic injury 14 years ago.

CT HEAD WITHOUT CONTRAST
TECHNIQUE: Contiguous axial images were obtained from the base of
the skull through the vertex without contrast.

[Series 2: head w/o · axial · non-contrast · 0.45mm/px · z∈[-345,-225]mm · 9 of 30 slices shown, 12 images]
[im 3/30  brain]
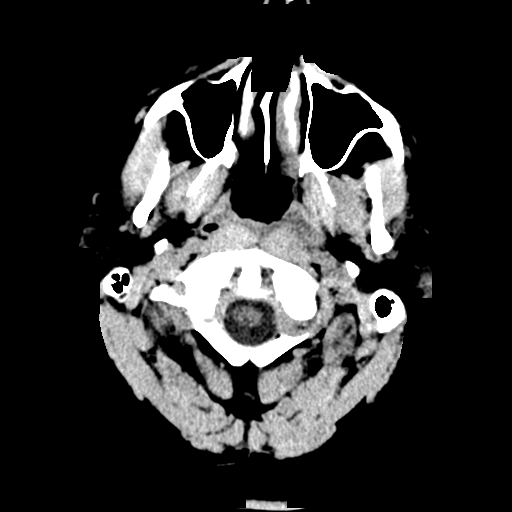
[im 3/30  bone]
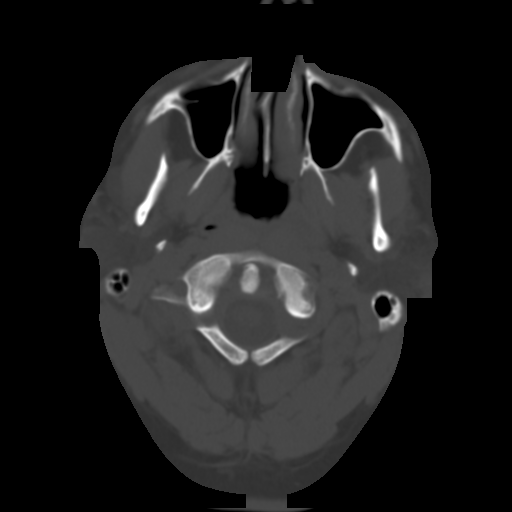
[im 6/30  brain]
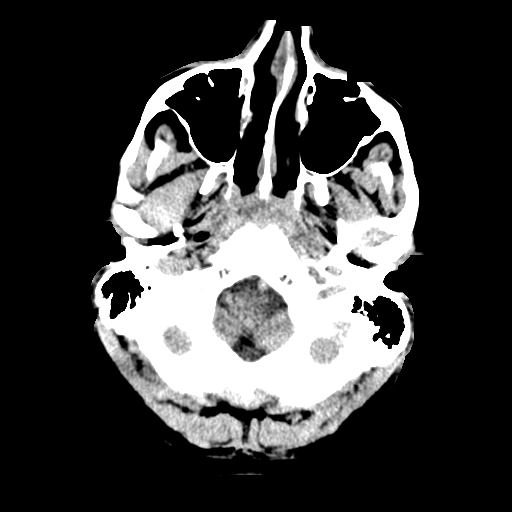
[im 9/30  brain]
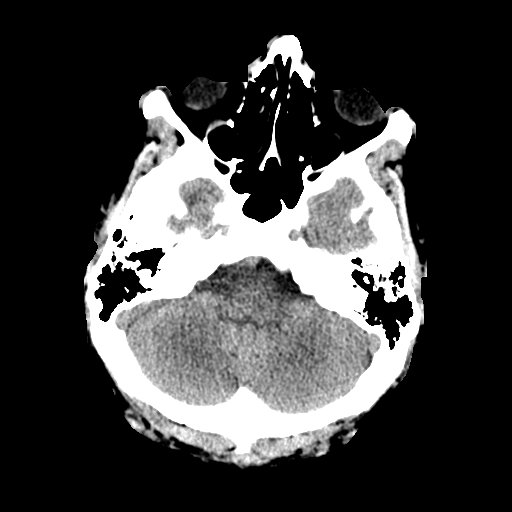
[im 12/30  brain]
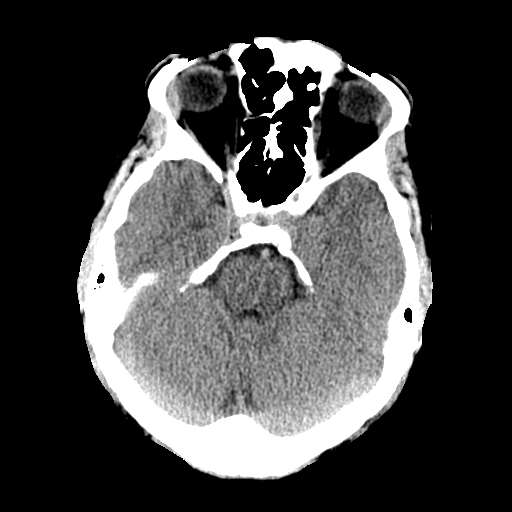
[im 15/30  brain]
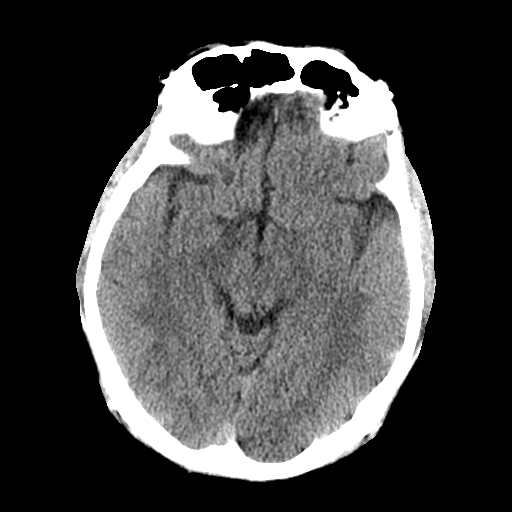
[im 15/30  bone]
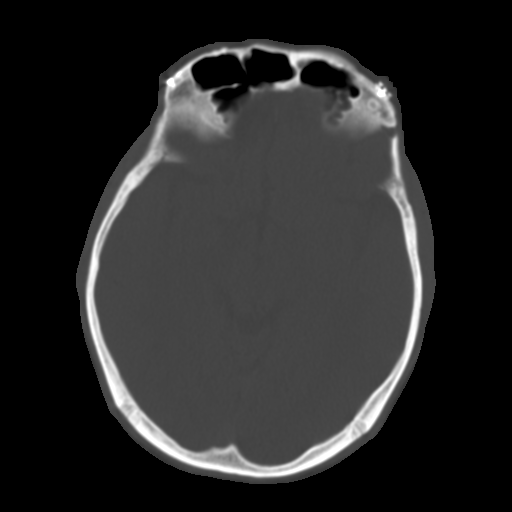
[im 18/30  brain]
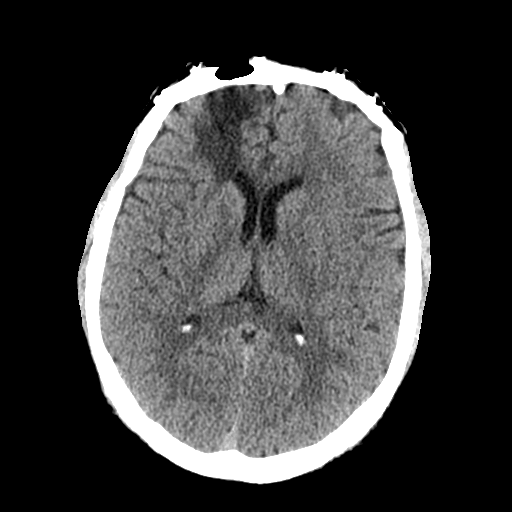
[im 21/30  brain]
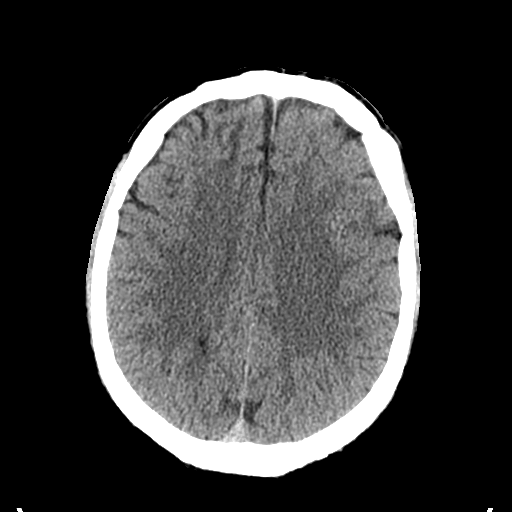
[im 24/30  brain]
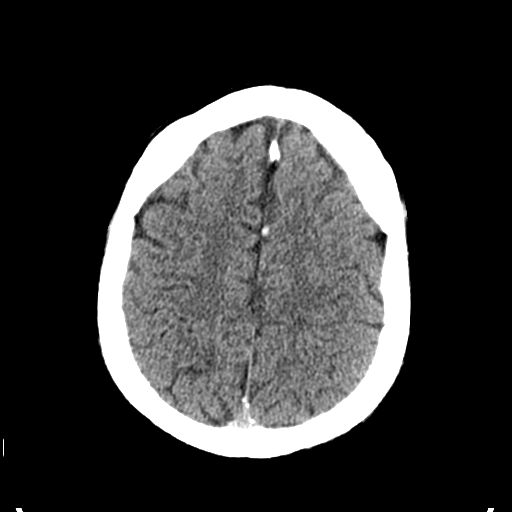
[im 27/30  brain]
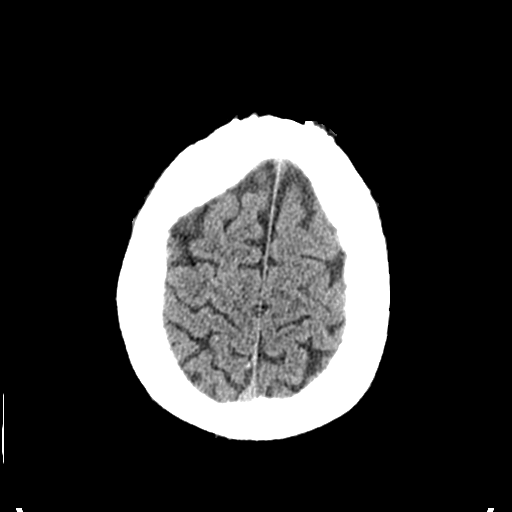
[im 27/30  bone]
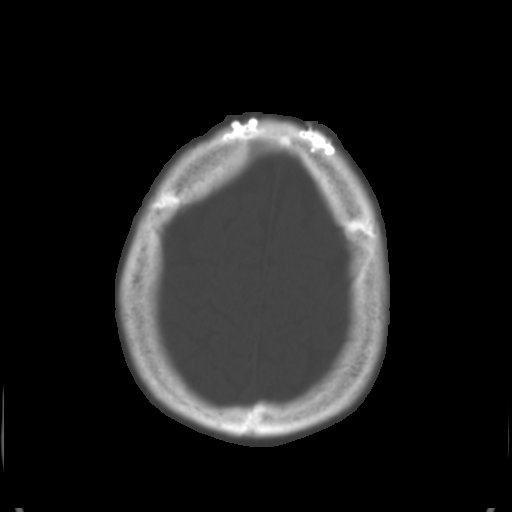

[Series 3: bone windows · axial · 0.45mm/px · z∈[-346,-238]mm · 7 of 50 slices shown]
[im 4/50  bone]
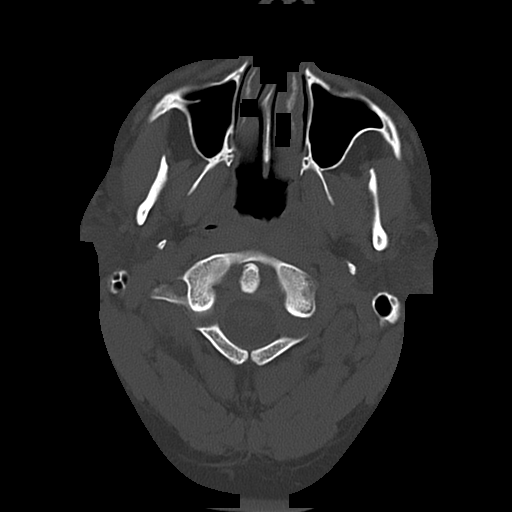
[im 10/50  bone]
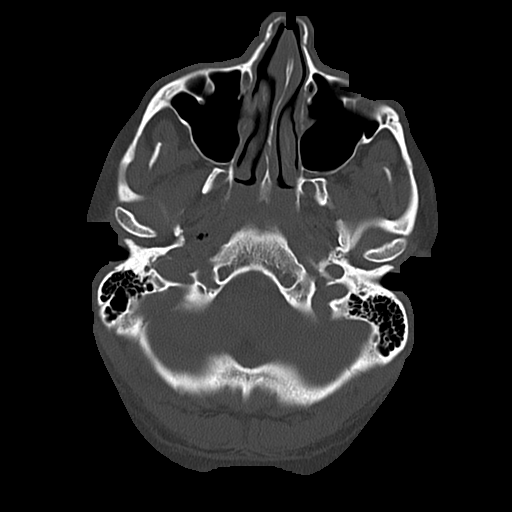
[im 17/50  bone]
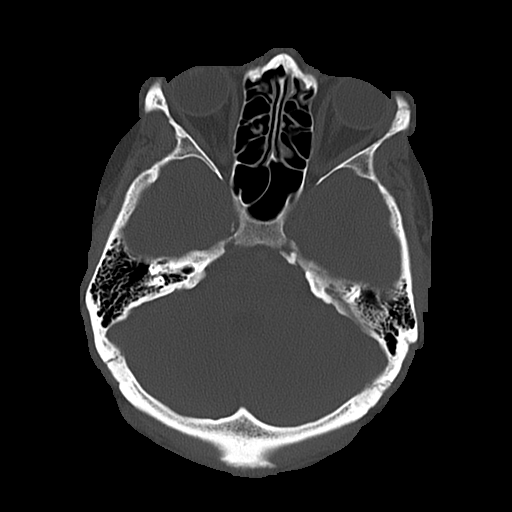
[im 23/50  bone]
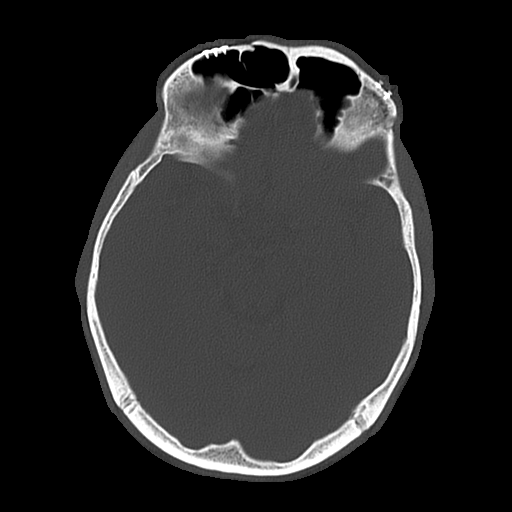
[im 27/50  bone]
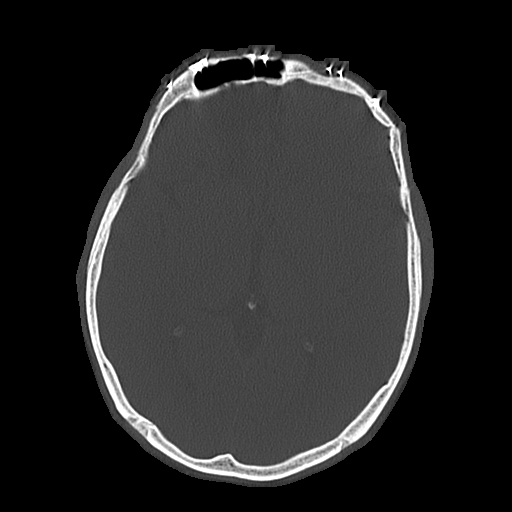
[im 33/50  bone]
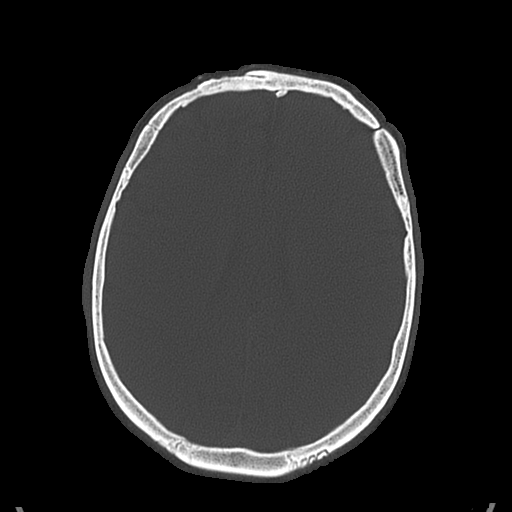
[im 40/50  bone]
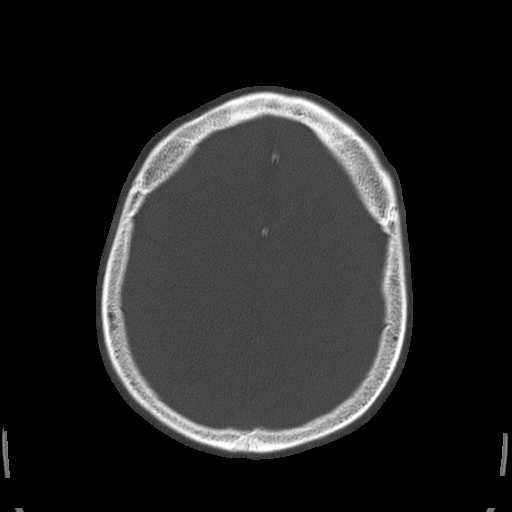

[16 of 30 positions shown; findings below may reference images not displayed]

FINDINGS: Postoperative changes with frontal craniotomy and plate
screw fixation.  Focal encephalomalacia in the right anterior
frontal lobe without change.  Ventricles and sulci are otherwise
symmetrical.  No mass effect or midline shift.  No abnormal extra-
axial fluid collections.  Gray-white matter junctions are distinct.
Basal cisterns are not effaced.  No ventricular dilatation.  No
evidence of acute intracranial hemorrhage.  Nasal bone fractures
without soft tissue swelling, likely chronic.  No depressed skull
fractures.  Stable appearance since previous study.
IMPRESSION: Postoperative changes with frontal craniotomy and right frontal
encephalomalacia.  No evidence of acute intracranial hemorrhage,
mass lesion, or acute infarct.

## 2013-02-07 NOTE — ED Notes (Signed)
Pt brought back to Er  Room 16 from Ct scan.  At 1745 . Pt awake and alert reports that he had  6 seizures today.   Pt not willing  To establish I/v  In left ac or right hand where vein is visible . Pt  Wants to  Sign AMA. " My boyfriend will drive me to [facility] and I know what to do. I am a nurse myself " Pt being evalauted by  Dr Kristeen Mans

## 2013-02-07 NOTE — ED Notes (Signed)
After evaluation by Dr Kristeen Mans, pt got up and walked out in steady gait . Pt was in no distress and ambulated without assistance

## 2013-02-07 NOTE — ED Notes (Signed)
Has had 7 seizures today. Neck hurts and lower back now. Last one 2 hours ago. Gets flashing lights and metalic taste in mouth . From OOT.
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
## 2013-02-10 ENCOUNTER — Inpatient Hospital Stay
Admit: 2013-02-10 | Discharge: 2013-02-11 | Disposition: A | Discharge status: Other Acute Inpatient Hospital | Payer: MEDICARE | Attending: Internal Medicine | Admitting: Internal Medicine

## 2013-02-10 DIAGNOSIS — T40601A Poisoning by unspecified narcotics, accidental (unintentional), initial encounter: Secondary | ICD-10-CM

## 2013-02-10 LAB — EKG, 12 LEAD, SUBSEQUENT
Atrial Rate: 70 {beats}/min
Calculated P Axis: 40 degrees
Calculated R Axis: 8 degrees
Calculated T Axis: 9 degrees
Diagnosis: NORMAL
P-R Interval: 160 ms
Q-T Interval: 394 ms
QRS Duration: 96 ms
QTC Calculation (Bezet): 425 ms
Ventricular Rate: 70 {beats}/min

## 2013-02-10 LAB — CBC WITH AUTOMATED DIFF
ABS. BASOPHILS: 0 10*3/uL (ref 0.0–0.06)
ABS. EOSINOPHILS: 0.1 10*3/uL (ref 0.0–0.4)
ABS. LYMPHOCYTES: 2.5 10*3/uL (ref 0.9–3.6)
ABS. MONOCYTES: 0.9 10*3/uL (ref 0.05–1.2)
ABS. NEUTROPHILS: 7.2 10*3/uL (ref 1.8–8.0)
BASOPHILS: 0 % (ref 0–2)
EOSINOPHILS: 1 % (ref 0–5)
HCT: 39.5 % (ref 36.0–48.0)
HGB: 13.4 g/dL (ref 13.0–16.0)
LYMPHOCYTES: 24 % (ref 21–52)
MCH: 30.4 PG (ref 24.0–34.0)
MCHC: 33.9 g/dL (ref 31.0–37.0)
MCV: 89.6 FL (ref 74.0–97.0)
MONOCYTES: 8 % (ref 3–10)
MPV: 9.9 FL (ref 9.2–11.8)
NEUTROPHILS: 67 % (ref 40–73)
PLATELET: 166 10*3/uL (ref 135–420)
RBC: 4.41 M/uL — ABNORMAL LOW (ref 4.70–5.50)
RDW: 13.5 % (ref 11.6–14.5)
WBC: 10.7 10*3/uL (ref 4.6–13.2)

## 2013-02-10 LAB — DRUG SCREEN, URINE
AMPHETAMINES: NEGATIVE
BARBITURATES: NEGATIVE
BENZODIAZEPINES: NEGATIVE
COCAINE: NEGATIVE
METHADONE: NEGATIVE
OPIATES: POSITIVE — AB
PCP(PHENCYCLIDINE): NEGATIVE
THC (TH-CANNABINOL): POSITIVE — AB

## 2013-02-10 LAB — METABOLIC PANEL, BASIC
Anion gap: 4 mmol/L (ref 3.0–18)
Anion gap: 6 mmol/L (ref 3.0–18)
BUN/Creatinine ratio: 11 — ABNORMAL LOW (ref 12–20)
BUN/Creatinine ratio: 11 — ABNORMAL LOW (ref 12–20)
BUN: 11 MG/DL (ref 7.0–18)
BUN: 10 MG/DL (ref 7.0–18)
CO2: 31 mmol/L (ref 21–32)
CO2: 29 mmol/L (ref 21–32)
Calcium: 8.3 MG/DL — ABNORMAL LOW (ref 8.5–10.1)
Calcium: 8.2 MG/DL — ABNORMAL LOW (ref 8.5–10.1)
Chloride: 106 mmol/L (ref 100–108)
Chloride: 105 mmol/L (ref 100–108)
Creatinine: 0.97 MG/DL (ref 0.6–1.3)
Creatinine: 0.88 MG/DL (ref 0.6–1.3)
GFR est AA: >60 mL/min/{1.73_m2} (ref >60)
GFR est AA: >60 mL/min/{1.73_m2} (ref >60)
GFR est non-AA: >60 mL/min/{1.73_m2} (ref >60)
GFR est non-AA: >60 mL/min/{1.73_m2} (ref >60)
Glucose: 95 mg/dL (ref 74–99)
Glucose: 73 mg/dL — ABNORMAL LOW (ref 74–99)
Potassium: 3.5 mmol/L (ref 3.5–5.5)
Potassium: 4.1 mmol/L (ref 3.5–5.5)
Sodium: 141 mmol/L (ref 136–145)
Sodium: 140 mmol/L (ref 136–145)

## 2013-02-10 LAB — SALICYLATE: Salicylate level: 3 MG/DL (ref 2.8–20.0)

## 2013-02-10 LAB — ETHYL ALCOHOL: ALCOHOL(ETHYL),SERUM: <3 MG/DL (ref 0–3)

## 2013-02-10 LAB — LIPID PANEL
CHOL/HDL Ratio: 4.7 (ref 0–5.0)
Cholesterol, total: 168 MG/DL (ref <200)
HDL Cholesterol: 36 MG/DL — ABNORMAL LOW (ref 40–60)
LDL, calculated: 112.8 MG/DL — ABNORMAL HIGH (ref 0–100)
Triglyceride: 96 MG/DL (ref <150)
VLDL, calculated: 19.2 MG/DL

## 2013-02-10 LAB — PHENYTOIN: Phenytoin: 13 ug/mL (ref 10.0–20.0)

## 2013-02-10 LAB — ACETAMINOPHEN: Acetaminophen level: <2 ug/mL — ABNORMAL LOW (ref 10–30)

## 2013-02-10 MED ADMIN — 0.9% sodium chloride infusion: INTRAVENOUS | @ 23:00:00 | NDC 00409798309

## 2013-02-10 MED ADMIN — levETIRAcetam (KEPPRA) 500 mg in 0.9% sodium chloride (MBP/ADV) 100 mL MBP: INTRAVENOUS | @ 11:00:00 | NDC 39822400001

## 2013-02-10 MED ADMIN — dextrose 5 % - 0.45% NaCl infusion: INTRAVENOUS | @ 11:00:00 | NDC 00409792609

## 2013-02-10 MED ADMIN — dextrose 5 % - 0.45% NaCl infusion: INTRAVENOUS | @ 21:00:00 | NDC 00409792609

## 2013-02-10 MED ADMIN — levETIRAcetam (KEPPRA) 500 mg in 0.9% sodium chloride (MBP/ADV) 100 mL MBP: INTRAVENOUS | @ 21:00:00 | NDC 39822400001

## 2013-02-10 MED ADMIN — pantoprazole (PROTONIX) injection 40 mg: INTRAVENOUS | @ 11:00:00 | NDC 00008400101

## 2013-02-10 MED ADMIN — 0.9% sodium chloride infusion 1,000 mL: INTRAVENOUS | @ 21:00:00 | NDC 00409798309
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
## 2013-02-10 NOTE — ED Notes (Signed)
Patent straight cathed for urine specimen. While removing shorts to obtain urine sample small bag of what appears to be marijuana fell out of patients shorts. [place] PD notified and bag given to officer.

## 2013-02-10 NOTE — ED Notes (Signed)
Patient placed on 4L O2 via NC due to falling SPO2. Patient unresponsive and snoring loudly.
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
## 2013-02-10 NOTE — Progress Notes (Addendum)
0650-received pt from ED, assisted transfer to ICU bed, monitors placed, pt had nasal trumpet in place upon arrival to ICU but pulled it out and threw at staff upon admission to ICU, slightly uncooperative and combative on admission, PIV x2 intact, IVF up per order, o2 at 4l per NC, HOB elevated, sitter at bedside    0735-  Bedside and Verbal shift change report given to Leotis Shames, RN (oncoming nurse) by Mallory Shirk, RN   (offgoing nurse).  Report given with ED Summary, MAR and Recent Results.

## 2013-02-10 NOTE — Progress Notes (Addendum)
7:29 PM Report received from Adele Barthel, RN. Assumed care of patient, sitter at bedside. Patient is speaking coherently, but only alert to self at current time. He is disgruntled and requesting to leave AMA, but has not been medically cleared. Medical detention order in place and explained to patient. Dr. Bradly Chris (psychiatrist) consulted and ordered that we obtain TDO consult by [facility] if patient attempts to leave. Patient has spoken with off-going and current charge nurse and requesting to speak with nursing supervisor, who was paged. He denies overdose attempt and says he took sleeping pills because he "has a big day tomorrow". Patient threatening to call his lawyer and has been given phone number for patient advocate.  7:57 PM Patient c/o about sitter; says he "does not feel safe" with that sitter. Supervisor on unit to speak with patient.  8:01 PM Patient denies being a danger to self or others, but admits to "having a few drinks" the night before.  8:41 PM Patient requesting to have condom catheter removed; it was already displaced and subsequently removed. Given urinal.  8:50 PM Patient expressing need to have BM. Offered assistance to [facility], room commode, or bedpan. Refuses; and requests to speak with supervisor.  9:11 PM Patient requesting to have nursing assignment changed; supervisor notified. She has already been up to speak with patient. Hospitalist on call paged.  9:16 PM Dr. Gwen Her notified of patient's desire to leave AMA; will be up to speak with patient.  9:28 PM Spoke with administrator on call, Lurena Joiner, regarding patient. Updated on patient situation.  9:48 PM Dr. Gwen Her at bedside speaking with patient.  10:19 PM Patient continues to be irritable, beligerent, and demanding. States he "made some bad decisions last night, but shouldn't have my rights violated and be held here against my will." Writer spoke with [facility], Lurena Joiner, who will be in to speak with patient.  10:27 PM Security called to speak with  patient and deescalate behavior. Three officers at bedside.  10:46 PM Patient requested for PIV in left foot to be removed. Taken out and pressure applied to site.  10:52 PM Administrator, care manager, and security present to speak with patient.  11:13 PM Patient's room phone and belongings removed from room, inventoried, and sent to security. Requesting to speak with lawyer. Care manager and administrator will allow that so long as lawyer is legitimate and calls nursing station directly. Patient refused and demanded to call lawyer himself; request denied.  11:30 PM Patient's IV in right hand displaced, refusing another one at this time. Requesting to speak with physician about going home. States that he just moved to the area for a very high paying job and will be fired if he is not present tomorrow. Offered a work excuse note and refused.  12:09 AM Patient refused reassessment and temperature. Does not appear to be in any physical distress or pain. Maintaining adequate BP but still has not voided. Sitter remains at bedside.  1:54 AM Patient asleep and in no apparent distress. Sitter at bedside.  4:19 AM Patient requesting water. NPO status explained to patient and was told that staff will assess his readiness to eat and drink during day shift.  4:46 AM Patient requesting to be evaluated by physician in order to be medically cleared. Writer stated that physician would round later in the morning and medically clear patient if appropriate. Patient requested to speak with nurse in charge and aforementioned information was reiterated.   5:47 AM Patient apologized to Clinical research associate for his  behavior. Said "I was having a really rough time. I was more impaired than I thought I was." Patient still requesting something to eat and drink; said he "is about to perish". NPO status reinforced.  5:58 AM [facility] contacted for renewal of medication TDO. [facility] employee stated that medical TDO cannot be renewed but to contact [facility] for  psychiatric TDO when patient is medically cleared.  7:14 AM Bedside and Verbal shift change report given to Lafonda Mosses, Charity fundraiser (oncoming nurse) by Marylene Land, RN (offgoing nurse).  Report given with SBAR, Kardex, Intake/Output, MAR and Recent Results.

## 2013-02-10 NOTE — Progress Notes (Signed)
Attempted to interview patient, he could not stay awake to complete a sentence.  Left unidentifying VMM with pt's emergency contact, his father, [name].  Will follow for discharge needs.

## 2013-02-10 NOTE — Progress Notes (Signed)
NUTRITION    Patient seen for:      []       Supplements      [x]     PO intake check   []       Glycemic control           []     Nutrition Support \\  [x]       Food Allergies   []     Food Preferences/tolerances    []       Education   []     Other    ____________________________________________________  [x]  No Cultural, religious or ethnic dietary need identified.   []  Cultural, religious and ethnic food preferences identified and addressed   [x]  Participated in discharge planning/Interdisciplinary rounds   Food allergies: []  No    [x]  Yes-fish containing products     ASSESSMENT      Pt screened for nutritional status. He is 6'2" 239lbs  Ideal wt 190 lbs.  He is 126% of  his ideal wt range  for age and height. l.  He is well nourished from available data. Pt is NPO, monitor intake when diet resumes.    PLAN:   Monitor intake as diet resumes.    SUBJECTIVE/OBJECTIVE:   Information obtained from:  ICU Rounds    Diet:  npo    Intake: []            Good     []            Fair      []            Poor    []     Inconsistent  No data found.      Most Recent POC Glucose:   Recent Labs      02/10/13   0820  02/10/13   0052   GLU  73*  95      Lab Results   Component Value Date/Time    Sodium 140 02/10/2013  8:20 AM    Potassium 4.1 02/10/2013  8:20 AM    Chloride 105 02/10/2013  8:20 AM    CO2 29 02/10/2013  8:20 AM    Anion gap 6 02/10/2013  8:20 AM    Glucose 73 02/10/2013  8:20 AM    BUN 10 02/10/2013  8:20 AM    Creatinine 0.88 02/10/2013  8:20 AM    BUN/Creatinine ratio 11 02/10/2013  8:20 AM    GFR est non-AA >60 02/10/2013  8:20 AM    Calcium 8.2 02/10/2013  8:20 AM    GFR est AA >60 02/10/2013  8:20 AM         Nutrition Problems Identified  [x]      None              []      Elevated blood sugars  []      Hypoglycemic episode(s)   [x]      Suboptimal po intake  []      Specified food preferences             []      Allergies    []      Difficulty chewing     []      Dentition   []     Difficulty swallowing  []      Constipation   []       Diarrhea  []      Nausea/Vomiting                      []   Other:                                                   PLAN:    []   Obtained/adjusted food preferences/tolerances and/or snacks options   []   Supplements added   []   HS snack added   []   Recommend adjustment in insulin/oral agents   []   Modify diet texture   []   Modify diet for food allergies   []   Educate patient   []   Assist with menu selection  [x]   Monitor po intake on meal rounds    [x]   Continue inpatient monitoring and intervention   []   Other:      Delila Spence, RD

## 2013-02-10 NOTE — Other (Signed)
TRANSFER - OUT REPORT:    Verbal report given to Alan Ripper, RN(name) on [employer]  being transferred to 2700(unit) for routine progression of care       Report consisted of patient???s Situation, Background, Assessment and   Recommendations(SBAR).     Information from the following report(s) SBAR, ED Summary and MAR was reviewed with the receiving nurse.    Opportunity for questions and clarification was provided.

## 2013-02-10 NOTE — ED Notes (Signed)
Patient arrived via EMS with reported overdose of dilaudid. Given 2mg  of narcan in route. Upon arrival patient awake and talking. Patient originally reported that he was here because he was drunk and did not know where he was when EMS picked him up. Patient stated that he wanted the IV taken out and to be discharged. Physician arrived in room and patient stated he did not know why he was here and that he wanted to sign out AMA. Dr. Einar Grad explained that the patient could not sign out AMA due to being impaired, patient argued that he did not have any alcohol in his system and he had the right to refuse treatment. When asked about taking dilaudid tablets patient stated he took 30. Physician explained that the overdose of narcotics makes the patient impaired and a danger to himself. Patient continues to argue and refuses any treatment.

## 2013-02-10 NOTE — Progress Notes (Addendum)
1945-Called to patients room per his request and pt is demanding that he be able to leave, that he is being held against his will, pt request to speak with nursing supervisor, informed patient that supervisor would be here as soon as possible, pt demanding that administration be called, patient advocate contact information provided.    2034-pt cursing at nurse, demanding that he be able to leave, pt unhappy with "attitude" of this Clinical research associate, argumentative, pt requesting that his catheter be removed, pt also states that he feels unhappy with "care" that I have provided, informed patient since he is not comfortable with my "care" that I would let his primary RN notify that he is requesting that catheter be removed, Dr. Gwen Her paged and notified that patient would like to speak with him. Asked patient if he would like his family called and patient does not want family called.     2044-sitting at bedside while sitter takes a break, pt states he cannot find his phone, asked permission to assist patient with removing sheets to locate phone, pt states "that is fine", phone handed to patient    2059-Rebecca McChan, [facility] on call transferred into room to speak with patient
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
## 2013-02-10 NOTE — ED Notes (Signed)
Security called to bedside.  Patient uncooperative, refusing all treatment and interventions.  Patient states he took 30 dilaudid to help his pain.  Patient with slurred speech.

## 2013-02-10 NOTE — Progress Notes (Signed)
Chaplain attempted to conducted an Initial Consultation and Spiritual Assessment for [name], who is a 27 y.o. y.o.,male.      The Chaplain provided the following Interventions:  Initiated a relationship of care and support.   Offered prayer and assurance of continue prayer on patients behalf.   Chart reviewed.    The following outcomes were achieved:  Limited interaction with patient who did not appear to be in any distress..    Assessment:  Unable to compete a spiritual or religious assessment due to patients lack of interaction.     Plan:  Chaplains will continue to follow and will provide pastoral care on an as needed/requested basis.  Chaplain recommends bedside caregivers page chaplain on duty if patient shows signs of acute spiritual or emotional distress.        Chaplain Briscoe Deutscher, MDiv,   Board Certified Chaplain  [phone] - Office

## 2013-02-10 NOTE — H&P (Signed)
Pt seen and examined.  H and P dictated.  # [id number].

## 2013-02-10 NOTE — Progress Notes (Signed)
Called for Psychiatry evaluation for capacity of decision making.   Dr. Bradly Chris will see the patient later today.

## 2013-02-10 NOTE — H&P (Signed)
[facility]                  [address] [place], [place]  [address]                            HISTORY AND PHYSICAL REPORT    PATIENT:     [name], [name]  CSN:             [id number]    ADMITTED:  02/10/2013  MRN:             [id number]     LOCATION:  ATTENDING:   Fredrik Cove, DO  DICTATING:   Fredrik Cove, DO      PRIMARY CARE PHYSICIAN: Unknown.    CHIEF COMPLAINT: Drug overdose.    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 Most of the  history was obtained from the ER physician, as the patient was sleeping at  the time of my evaluation. Per the ER physician, the patient had taken about  20-30 Dilaudid and unknown quantity of benzodiazepines and subsequently was  brought by EMS to the emergency department of [facility] for evaluation. The  patient received Narcan en route and became very combative and  uncooperative with the staff in the emergency department and subsequently  started sleeping deeply. Workup at [facility] revealed positive opiates and  tetrahydrocannabinol on the urine drug screen and CT of the head done on  02/07/2013 revealed no acute abnormalities with the exception of old right  frontal lobe infarct. The hospitalist service was consulted to admit this  patient to the hospital.    PAST MEDICAL HISTORY: Per medical record significant for  1. History of seizure disorder.  2. History of bipolar disorder.  3. History of anxiety.  4. History of traumatic brain injury.  5. Hypertension.  6. History of hyperlipidemia.  7. History of migraines.  8. 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  9. History of polysubstance abuse.    PAST SURGICAL HISTORY  1. Status post sinus surgery in the past.  2. Status post right elbow surgery in the past.  3. Status post right frontal lobectomy in the past.    ALLERGIES  1. IODINE.  2. FISH CONTAINING PRODUCTS.  3. SHELLFISH CONTAINING PRODUCTS.    HOME MEDICATIONS  1. Keppra 500 mg p.o. twice daily.  2. Lovastatin 20 mg p.o. daily.  3. Dilantin 100 mg p.o. 3 times daily.  4. Hydrochlorothiazide 25 mg p.o. daily.  5. Klonopin 1 mg p.o. twice daily as needed.  6. Lamictal 150 mg p.o. twice daily.  7. Lisinopril 20 mg p.o. daily.    FAMILY HISTORY: Per chart review, the patient's sister and grandfather had  seizures.    SOCIAL HISTORY: Per chart review, the patient uses marijuana, though there  was no history of ethanol use noted.    REVIEW OF SYSTEMS  Could not be obtained from the patient as the patient was sleeping at the  time of my evaluation.    PHYSICAL EXAMINATION  VITAL SIGNS: Blood pressure  98/41, oxygen saturation 100%, temperature not  recorded.  GENERAL: The patient is a young Caucasian male, lying in bed and sleeping,  in no appreciable distress.  HEENT: Eyes were closed and trachea appears midline with no thyromegaly.  CARDIOVASCULAR: Normal S1, S2, Regular rate and rhythm.  RESPIRATORY: Chest was clear to auscultation bilaterally.  ABDOMEN: Soft, nondistended. Bowel sounds noted.  EXTREMITIES: No edema was noted and pulses were palpated bilaterally.  SKIN: Warm and dry with no obvious skin lesions or discolorations noted.  NEUROLOGIC: Could not be assessed as the patient was sleeping at the time  of my evaluation.    LABORATORY DATA: Complete blood count revealed a white blood cell count of  10.7, hemoglobin 13.4, hematocrit 39.5, and platelets 166.    Basic metabolic profile revealed sodium 141, potassium 3.5, chloride 106,  bicarbonate 31, BUN 11, creatinine 0.97, and glucose  95. Calcium was 8.7.    TOXICOLOGY: Urine drug screen was positive for opiates and  tetrahydrocannabinol, otherwise negative.    Therapeutic drug levels: Acetaminophen and Salicylate were negative.    ELECTROCARDIOGRAM: No EKG was ordered and I will be ordering one at this  time.    IMPRESSION  1. Opioid overdose.  2. Marijuana use.  3. History of seizure disorder.  4. Borderline hypotension.  5. History of hyperlipidemia.  6. History of bipolar disorder.  7. History of anxiety disorder.  8. History of traumatic brain injury.  9. History of migraine headaches.    PLAN  1. Opioid overdose. The patient is status post Narcan and will be closely  monitored in the Medical Intensive Care Unit with careful monitoring of the  cardiopulmonary and respiratory status. Routine neuro checks will be done  and the patient has been placed on fall and aspiration precautions.  2. History of seizure disorder. The patient was started on Keppra 500 mg IV  twice daily as the patient is currently nothing by mouth and placed on  seizure precautions. The patient is usually on Keppra, Dilantin and  Lamictal, and we will be checking levels of these medications during this  admission.  3. Borderline hypotension. The patient was started on D5 0.45% normal  saline at 100 mL per hour and will be monitored closely for clinical  improvement. We will be holding all blood pressure lowering medications at  this time.  4. Hyperlipidemia. Lipid profile will be checked and once the patient is  taking orally, the patient should be restarted on his usual dose of  Lovastatin.  5. Prophylaxis. The patient was started on Protonix for gastrointestinal  prophylaxis and sequential compression device will be applied to bilateral  lower extremities for deep venous thrombosis prophylaxis.    Further evaluation and treatment will be dictated by pending workup and the  patient's overall clinical course.                       Dictated By: Fredrik Cove, DO    Signed By:    NSL:wmx  D: 02/10/2013 05:51 A T: 02/10/2013 08:18 A  Job #:  [id number]  CScriptDoc #:  [id number]  cc:   Fredrik Cove, DO

## 2013-02-10 NOTE — Progress Notes (Addendum)
Medicine Progress Note    Patient: [name]   Age:  27 y.o.  DOA: 02/10/2013   Admit Dx / CC: Opioid overdose, initial encounter  Narcotic overdose  LOS:  LOS: 0 days         Active Problems:    Opioid overdose (02/10/2013)      Narcotic overdose (02/10/2013)                Anticipated Date of Discharge: 02/11/2013  Anticipated Disposition (home, SNF) : Home      Subjective:   Patient seen and examined in the ICU during morning round.  Lying in bed, somnolent and in no acute distress.  Patient cannot provide history at this time.   VS WNL, noted BP around 90-95 systolic.     Objective:     Visit Vitals   Item Reading   ??? BP 99/48   ??? Pulse 67   ??? Temp 98.3 ??F (36.8 ??C)   ??? Resp 19   ??? Ht 6\' 2"  (1.88 m)   ??? Wt 108.6 kg (239 lb 6.7 oz)   ??? BMI 30.73 kg/m2   ??? SpO2 94%       Physical Exam:  General appearance: young caucasian male, somnolent, no distress, appears stated age  Head: old scar around hair line  Neck: supple, trachea midline  Lungs: clear to auscultation bilaterally  Heart: regular rate and rhythm, S1, S2 normal, no murmur  Abdomen: soft, non-tender. Bowel sounds normal  Extremities: extremities normal, atraumatic, no cyanosis or edema  Skin: Skin color, texture, turgor normal. No rashes or lesions  Neurologic: normal DTR's      Intake and Output:  Current Shift:  07/18 0700 - 07/18 1859  In: 106.7 [I.V.:106.7]  Out: -   Last three shifts:       Lab/Data Reviewed:  Recent Results (from the past 24 hour(s))   PHENYTOIN    Collection Time     02/10/13 12:28 AM       Result Value Range    Phenytoin 13.0  10.0 - 20.0 ug/mL   CBC WITH AUTOMATED DIFF    Collection Time     02/10/13 12:52 AM       Result Value Range    WBC 10.7  4.6 - 13.2 K/uL    RBC 4.41 (*) 4.70 - 5.50 M/uL    HGB 13.4  13.0 - 16.0 g/dL    HCT 16.1  09.6 - 04.5 %    MCV 89.6  74.0 - 97.0 FL    MCH 30.4  24.0 - 34.0 PG    MCHC 33.9  31.0 - 37.0 g/dL    RDW 40.9  81.1 - 91.4 %    PLATELET 166  135 - 420 K/uL    MPV 9.9  9.2 - 11.8 FL     NEUTROPHILS 67  40 - 73 %    LYMPHOCYTES 24  21 - 52 %    MONOCYTES 8  3 - 10 %    EOSINOPHILS 1  0 - 5 %    BASOPHILS 0  0 - 2 %    ABS. NEUTROPHILS 7.2  1.8 - 8.0 K/UL    ABS. LYMPHOCYTES 2.5  0.9 - 3.6 K/UL    ABS. MONOCYTES 0.9  0.05 - 1.2 K/UL    ABS. EOSINOPHILS 0.1  0.0 - 0.4 K/UL    ABS. BASOPHILS 0.0  0.0 - 0.06 K/UL    DF AUTOMATED  METABOLIC PANEL, BASIC    Collection Time     02/10/13 12:52 AM       Result Value Range    Sodium 141  136 - 145 mmol/L    Potassium 3.5  3.5 - 5.5 mmol/L    Chloride 106  100 - 108 mmol/L    CO2 31  21 - 32 mmol/L    Anion gap 4  3.0 - 18 mmol/L    Glucose 95  74 - 99 mg/dL    BUN 11  7.0 - 18 MG/DL    Creatinine 2.95  0.6 - 1.3 MG/DL    BUN/Creatinine ratio 11 (*) 12 - 20      GFR est AA >60  >60 ml/min/1.29m2    GFR est non-AA >60  >60 ml/min/1.58m2    Calcium 8.3 (*) 8.5 - 10.1 MG/DL   ETHYL ALCOHOL    Collection Time     02/10/13 12:52 AM       Result Value Range    ALCOHOL(ETHYL),SERUM <3  0 - 3 MG/DL   ACETAMINOPHEN    Collection Time     02/10/13 12:52 AM       Result Value Range    ACETAMINOPHEN <2 (*) 10 - 30 ug/mL   SALICYLATE    Collection Time     02/10/13 12:52 AM       Result Value Range    SALICYLATE 3.0  2.8 - 20.0 MG/DL   LIPID PANEL    Collection Time     02/10/13 12:52 AM       Result Value Range    LIPID PROFILE          Cholesterol, total 168  <200 MG/DL    Triglyceride 96  <621 MG/DL    HDL Cholesterol 36 (*) 40 - 60 MG/DL    LDL, calculated 308.6 (*) 0 - 100 MG/DL    VLDL, calculated 57.8      CHOL/HDL Ratio 4.7  0 - 5.0     DRUG SCREEN, URINE    Collection Time     02/10/13  4:00 AM       Result Value Range    BENZODIAZEPINE NEGATIVE   NEG      BARBITURATES NEGATIVE   NEG      THC (TH-CANNABINOL) POSITIVE (*) NEG      OPIATES POSITIVE (*) NEG      PCP(PHENCYCLIDINE) NEGATIVE   NEG      COCAINE NEGATIVE   NEG      AMPHETAMINE NEGATIVE   NEG      METHADONE NEGATIVE       HDSCOM (NOTE)     EKG, 12 LEAD, SUBSEQUENT    Collection Time     02/10/13   6:01 AM       Result Value Range    Ventricular Rate 70      Atrial Rate 70      P-R Interval 160      QRS Duration 96      Q-T Interval 394      QTC Calculation (Bezet) 425      Calculated P Axis 40      Calculated R Axis 8      Calculated T Axis 9      Diagnosis        Value: Normal sinus rhythm      Normal ECG      No previous ECGs available   METABOLIC PANEL, BASIC  Collection Time     02/10/13  8:20 AM       Result Value Range    Sodium 140  136 - 145 mmol/L    Potassium 4.1  3.5 - 5.5 mmol/L    Chloride 105  100 - 108 mmol/L    CO2 29  21 - 32 mmol/L    Anion gap 6  3.0 - 18 mmol/L    Glucose 73 (*) 74 - 99 mg/dL    BUN 10  7.0 - 18 MG/DL    Creatinine 1.61  0.6 - 1.3 MG/DL    BUN/Creatinine ratio 11 (*) 12 - 20      GFR est AA >60  >60 ml/min/1.83m2    GFR est non-AA >60  >60 ml/min/1.72m2    Calcium 8.2 (*) 8.5 - 10.1 MG/DL     All lab results for the last 24 hours reviewed.    Medications Reviewed:  Current Facility-Administered Medications   Medication Dose Route Frequency   ??? levETIRAcetam (KEPPRA) 500 mg in 0.9% sodium chloride (MBP/ADV) 100 mL MBP  500 mg IntraVENous Q12H   ??? pantoprazole (PROTONIX) injection 40 mg  40 mg IntraVENous Q24H   ??? dextrose 5 % - 0.45% NaCl infusion  100 mL/hr IntraVENous CONTINUOUS         Assessment/Plan   Active Problems:    Opioid overdose (02/10/2013)      Narcotic overdose (02/10/2013)    27 y.o. m admitted with opiates overdose. Patient is in ICU and is currently stable.   Altered level of consciousness and no history can be obtained.     Additional Plan notes     1. Will continue same management.   2. Will r/o suicidal attempt or ideation once alert and co-operative      Carlyle Lipa, MD  P# [phone]  February 10, 2013

## 2013-02-10 NOTE — ED Notes (Signed)
EMS brought two pill bottles:  Hydromorphone 2mg  issued on 02/09/13, 60 tabs -- EMPTY  Clonazepam 1 mg issued on 02/09/13, 90 tabs -- 33 in bottle.

## 2013-02-10 NOTE — Progress Notes (Addendum)
39 - Assumed care of patient. Patient saturations decreased to high 80's and is snoring loudly. Asked respiratory to evaluate patient. Raised patient's HOB and saturations have increased to 100's. Will continue to monitor. Sitter at bedside within arms reach of patient.   1610 - Patient continues to sleep. Will occasionally awaken and look around room in confused manner then return to sleep. Will arouse to tactile stimulation, however, patient is not responding to questions. Dr. Edward Jolly aware patient has been lethargic and states to allow patient to sleep. Respirations are even and unlabored. "Snoring" has decreased.   1100 - Patient will awaken periodically, though mumbles speech and does not follow commands. Strong hacking cough noted. Sitter at bedside.   1300 - Patient has awoken and asking to leave AMA. Explained that he has a medical detention order in place and cannot leave AMA. Patient became very upset, however, shortly after fell asleep.   1320 - Patient voided large amount this morning, but has not voided since. Dr. Beverely Pace aware. Additionally, discussed SBP being in 90's and that patient will be evaluated by psychiatry later today. No acute changes in patient.   1545 - Patient occasionally awaking and will be verbally aggressive, then will go back to sleep. Sitter at bedside.   1730 - Spoke to Dr. Beverely Pace about patient's continued anuria and systolic blood pressure trending down to 80's. Orders to bolus 1000 NS and call in an hour.   1830 - SBP now greater than 100, however, patient has not voided. Patient states he does not have to urinate and when I attempted to bladder scan him he refused and stated "it doesn't matter because I won't let you catheterize me anyways." Dr. Beverely Pace states to give another NS at 250/hr. Call hospitalist on call if patient still hasn't voided by 2000. Notify Dr. Beverely Pace directly if patient becomes unstable.   76 - Dr. Bradly Chris at bedside. Patient became very agitated and  verbally aggressive. Refusing barcode scans for medications.   1900 - Bedside and Verbal shift change report given to A. Montanio (Cabin crew) by Maury Dus (offgoing nurse).  Report given with SBAR, Kardex, Intake/Output and MAR.

## 2013-02-11 DIAGNOSIS — F314 Bipolar disorder, current episode depressed, severe, without psychotic features: Secondary | ICD-10-CM

## 2013-02-11 LAB — CBC WITH AUTOMATED DIFF
ABS. BASOPHILS: 0 10*3/uL (ref 0.0–0.06)
ABS. EOSINOPHILS: 0.2 10*3/uL (ref 0.0–0.4)
ABS. LYMPHOCYTES: 2.5 10*3/uL (ref 0.9–3.6)
ABS. MONOCYTES: 0.5 10*3/uL (ref 0.05–1.2)
ABS. NEUTROPHILS: 2.7 10*3/uL (ref 1.8–8.0)
BASOPHILS: 0 % (ref 0–2)
EOSINOPHILS: 3 % (ref 0–5)
HCT: 38.4 % (ref 36.0–48.0)
HGB: 12.7 g/dL — ABNORMAL LOW (ref 13.0–16.0)
LYMPHOCYTES: 42 % (ref 21–52)
MCH: 29.7 PG (ref 24.0–34.0)
MCHC: 33.1 g/dL (ref 31.0–37.0)
MCV: 89.9 FL (ref 74.0–97.0)
MONOCYTES: 9 % (ref 3–10)
MPV: 10.6 FL (ref 9.2–11.8)
NEUTROPHILS: 46 % (ref 40–73)
PLATELET: 154 10*3/uL (ref 135–420)
RBC: 4.27 M/uL — ABNORMAL LOW (ref 4.70–5.50)
RDW: 13.3 % (ref 11.6–14.5)
WBC: 5.9 10*3/uL (ref 4.6–13.2)

## 2013-02-11 NOTE — Progress Notes (Signed)
Medicine Progress Note    Patient: [name]   Age:  27 y.o.  DOA: 02/10/2013   Admit Dx / CC: Opioid overdose, initial encounter  Narcotic overdose  LOS:  LOS: 1 day         Active Problems:    History of suicidal ideation (02/11/2013)        Anticipated Date of Discharge: 02/11/13  Anticipated Disposition (home, SNF) : ?    Subjective:   Patient seen and examined during morning round.  Lying comfortably in bed and in no acute distress.  No complaints, No N/V, CP or SOB.   Patient is requesting to be discharged home or to leave AMA.    He was admitted with Opioids OD to the ICU.   Per Psychiatric evaluation yesterday, patient will need further evaluation to determine if he needs to be placed on a TDO  He is now medically stable and can be transferred to regular medical floor.       Objective:     Visit Vitals   Item Reading   ??? BP 126/66   ??? Pulse 68   ??? Temp 98.4 ??F (36.9 ??C)   ??? Resp 14   ??? Ht 6\' 2"  (1.88 m)   ??? Wt 108.6 kg (239 lb 6.7 oz)   ??? BMI 30.73 kg/m2   ??? SpO2 95%       Physical Exam:  General appearance: alert, cooperative, no distress, appears stated age  Neck: supple, trachea midline  Lungs: clear to auscultation bilaterally  Heart: regular rate and rhythm, S1, S2 normal, no murmur, click, rub or gallop  Abdomen: soft, non-tender. Bowel sounds normal. No masses,  no organomegaly  Neurologic: Grossly normal    Intake and Output:  Current Shift:     Last three shifts:  07/17 1900 - 07/19 0659  In: 2706.7 [I.V.:2706.7]  Out: 0     Lab/Data Reviewed:  Recent Results (from the past 24 hour(s))   CBC WITH AUTOMATED DIFF    Collection Time     02/11/13  3:45 AM       Result Value Range    WBC 5.9  4.6 - 13.2 K/uL    RBC 4.27 (*) 4.70 - 5.50 M/uL    HGB 12.7 (*) 13.0 - 16.0 g/dL    HCT 53.6  64.4 - 03.4 %    MCV 89.9  74.0 - 97.0 FL    MCH 29.7  24.0 - 34.0 PG    MCHC 33.1  31.0 - 37.0 g/dL    RDW 74.2  59.5 - 63.8 %    PLATELET 154  135 - 420 K/uL    MPV 10.6  9.2 - 11.8 FL    NEUTROPHILS 46  40 -  73 %    LYMPHOCYTES 42  21 - 52 %    MONOCYTES 9  3 - 10 %    EOSINOPHILS 3  0 - 5 %    BASOPHILS 0  0 - 2 %    ABS. NEUTROPHILS 2.7  1.8 - 8.0 K/UL    ABS. LYMPHOCYTES 2.5  0.9 - 3.6 K/UL    ABS. MONOCYTES 0.5  0.05 - 1.2 K/UL    ABS. EOSINOPHILS 0.2  0.0 - 0.4 K/UL    ABS. BASOPHILS 0.0  0.0 - 0.06 K/UL    DF AUTOMATED       All lab results for the last 24 hours reviewed.    Medications Reviewed:  Current Facility-Administered  Medications   Medication Dose Route Frequency   ??? levETIRAcetam (KEPPRA) 500 mg in 0.9% sodium chloride (MBP/ADV) 100 mL MBP  500 mg IntraVENous Q12H   ??? pantoprazole (PROTONIX) injection 40 mg  40 mg IntraVENous Q24H   ??? dextrose 5 % - 0.45% NaCl infusion  100 mL/hr IntraVENous CONTINUOUS   ??? [COMPLETED] 0.9% sodium chloride infusion 1,000 mL  1,000 mL IntraVENous ONCE   ??? [COMPLETED] 0.9% sodium chloride infusion  1,000 mL/hr IntraVENous ONCE         Assessment/Plan   Active Problems:    History of suicidal ideation (02/11/2013)        Additional Plan notes     1. Transfer to medical floor  2. Continue observation 1:1   3. Will resume regular diet  4. Will need further Psychiatric evaluation before decision about discharge/transfer to another facility     Carlyle Lipa, MD  P# [phone]  February 11, 2013

## 2013-02-11 NOTE — Progress Notes (Signed)
Problem: Falls - Risk of  Goal: *Knowledge of fall prevention  Outcome: Not Progressing Towards Goal  Not receptive to education    Problem: Patient Education: Go to Patient Education Activity  Goal: Patient/Family Education  Variance: Patient Condition  Comments: Not receptive to education    Problem: Patient Education: Go to Patient Education Activity  Goal: Patient/Family Education  Variance: Patient Uncooperative  Comments: Patient unreceptive to education    Problem: Suicide/Homicide (Adult/Pediatric)  Goal: *STG: Remains safe in hospital  Outcome: Progressing Towards Goal   Not verbalizing SI/HI, Sitter at bedside  Goal: *STG: Seeks staff when feelings of self harm or harm towards others arise  Outcome: Progressing Towards Goal  Patient denies SI/HI  Goal: *STG: Attends activities and groups  Variance: Resources not available      Goal: *STG: Verbalizes alternative ways of dealing with maladaptive feelings/behaviors  Variance: Patient Condition      Goal: *STG/LTG: Complies with medication therapy  Outcome: Not Progressing Towards Goal  Patient uncooperative  Goal: *STG/LTG: No longer expresses self destructive or suicidal/homicidal thoughts  Outcome: Progressing Towards Goal  Denies at this time  Goal: *LTG: Develops proactive suicide prevention plan  Variance: Patient Condition        Problem: Patient Education: Go to Patient Education Activity  Goal: Patient/Family Education  Variance: Patient Uncooperative

## 2013-02-11 NOTE — Discharge Summary (Addendum)
Discharge Summary    Patient: [name]               Sex: male          DOA: 02/10/2013         Date of Birth:  July 04, 1986      Age:  27 y.o.        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  Patient was heavily sedated and received IV NARCAN in route which made him severely agitated upon arriving to [facility] ER. He was admitted to the ICU s/p overdose of Dilaudid.   Patient was stable but somnolent during the first 36 hours of his stay and was monitored in the ICU without intervention. He noted to have low BP although has h/o HTN, most probably secondary to mild dehydration.     During his admission patient was seen by Psychiatrist who recommended continue monitoring and evaluation by the [facility] while medically cleared.     This patient currently represents an imminent danger to himself. While he is not reporting active si/hi, he put himself in danger.      Per Psychiatry evaluation, patient is to be transferred to [facility] Unit within [facility] .  The patient is medically stable for this transfer.    He will need close follow up by primary care doctor as well as Neurologist and Psychiatrist as outpatient once he is discharge.     Imaging:  @INSERTIMAGE @     Consults: None    Labs:  Labs: Results:       Chemistry Recent Labs      02/10/13   0820  02/10/13   0052   GLU  73*  95   NA  140  141   K  4.1  3.5   CL  105  106   CO2  29  31   BUN  10  11   CREA  0.88  0.97   CA  8.2*  8.3*   AGAP  6  4   BUCR  11*  11*       CBC w/Diff Recent Labs      02/11/13   0345  02/10/13   0052   WBC  5.9  10.7   RBC  4.27*  4.41*   HGB  12.7*  13.4   HCT  38.4  39.5   PLT  154  166   GRANS  46  67   LYMPH  42  24   EOS  3  1      Cardiac Enzymes No results found for this basename: CPK, CKRMB, CKND1, TROIP, MYO,  in the last 72 hours   Coagulation No results found for this basename: PTP, INR, APTT,  in the last 72 hours    Lipid Panel Lab Results   Component Value Date/Time    Cholesterol, total 168 02/10/2013 12:52 AM    HDL Cholesterol 36 02/10/2013 12:52 AM  LDL, calculated 112.8 02/10/2013 12:52 AM    VLDL, calculated 19.2 02/10/2013 12:52 AM    Triglyceride 96 02/10/2013 12:52 AM    CHOL/HDL Ratio 4.7 02/10/2013 12:52 AM      BNP No results found for this basename: BNPP,  in the last 72 hours   Liver Enzymes No results found for this basename: TP, ALB, TBIL, AP, SGOT, GPT, DBIL,  in the last 72 hours   Thyroid Studies Lab Results   Component Value Date/Time    TSH 2.00 07/28/2010  5:10 PM              Treatment Team: Treatment Team: Attending Provider: Fredrik Cove, DO; Consulting Provider: Fredrik Cove, DO; Hospitalist: Fredrik Cove, DO    Significant Diagnostic Studies: labs:       Discharge Date: 02/11/2013  Discharge Condition: Stable  Activity: Activity as tolerated  Diet: Regular Diet    Discharge Medications:     Current Discharge Medication List      CONTINUE these medications which have NOT CHANGED    Details   lisinopril (PRINIVIL, ZESTRIL) 20 mg tablet Take  by mouth daily.      lamoTRIgine (LAMICTAL) 150 mg tablet Take 150 mg by mouth two (2) times a day.      clonazePAM (KLONOPIN) 1 mg tablet Take 1 mg by mouth two (2) times daily as needed.      hydrochlorothiazide (HYDRODIURIL) 25 mg tablet Take 1 Tab by mouth daily.  Qty: 14 Tab, Refills: 1      PHENYTOIN SODIUM EXTENDED (DILANTIN PO) Take 100 mg by mouth three (3) times daily.      lovastatin (MEVACOR) 20 mg tablet Take 20 mg by mouth nightly.      levETIRAcetam  (KEPPRA) 500 mg tablet Take 1 Tab by mouth two (2) times a day.  Qty: 8 Tab, Refills: 1                 Follow-up:   PCP and Neurology.  Follow up Psych recommendations as per accepting facility.       Carlyle Lipa, MD  P# [phone]  February 11, 2013        Total time spent 

## 2013-02-11 NOTE — Progress Notes (Addendum)
Rec'd bedside report pt asleep no signs of distress noted,sitter at bedside.  08:00 vital signs stable assessment done.Pt awake very anxious wants to go home.  09:00 pt continues to express his needs to have to leave to start his new job and that he's being held against his will. Updated re: Doctors orders.  10:00 pt continues to ask the same questions , given the same answers.  10:45 Chart info faxed to [facility] spoke to Deanna.  12:00 sitter remains at bedside no changed in pts status continues to inform us of his wanting to leave.  12:15 Vikki Ports arrived from [facility] to interview pt.  12:50 pt will be TDO'd Vikki Ports provided with all necessary information.Pt again asking when will he leave made aware of transfer to another facility.Contiues to argue about why he is here and why can't he leave.  14:00 no changes sitter remains at bedside.  14:50 Vikki Ports called bed available at [facility] first must go to [employer] office for order.  16:00 no changes in pts status continues to sleep the rest of day.  18:30 report given to Sallye Ober RN receiving nurse at [facility] unit pt going to room 1062.

## 2013-02-11 NOTE — Behavioral Health Treatment Team (Signed)
Pt generally cooperative with admission process however withholding of information at times. Details of SA hx not discussed rt hostility/lability. Denied trying to kill self, any homicidal thoughts, delusions or hallucinations. Demanding, argumentative, injustice gathering, accusatory, rude, and disparaging of staff in general. Patient advocate information provided and assistance offered with contact of same. Voiced desire to call his lawyer and phone offered with explanation of use. Demanded to see Supervisor and Ms. Carlynn Purl RN came to unit and spoke to pt. Demanded a second time rt demand for Klonopin, however dose obtained given and pt was in bed. Pt discussed by this Clinical research associate with Dr. Flo Shanks and single dose of 0.5 mg provided rt potential for benzodiazepine withdrawal. Pt told this writer had not taken any of his prescribed medication "In a long time" and would not elaborate, yet was focused on having taken the Klonopin for "14 years". Pt denied multiple statements made to this writer, particularly when information supported presentation of reality of situation. Unclear if actually could not remember (rt TBI) or if short term memory deficit, or combination of both. Illogical at times. Repetitive, this perceived to be rt cognitive issues/memory deficit by this Clinical research associate. Refused to identify an emergency contact person. Pt voicing desire to go to another facility and informed need to discuss with MD tomorrow. Was provided with a meal and slip resistant footwear on arrival, as well as any information requested, included TDO process and outcomes. Pt refused vital signs. No other evidence of substance withdrawal on admission. Will continue suicide precautions, seizure precauions and every 15 min rounds, as well as information, reassurance and assistance with need for advocacy. Vital signs every 4 hours if will allow, with CIWA.

## 2013-02-11 NOTE — Behavioral Health Treatment Team (Addendum)
He was offered his evening medications.  Keppra and clonidine were ordered.  He asked where his other medications were.  He stated he normally takes klonopin.  It was explained to him that the doctor had not written an order for klonopin.  It was also explained to him that clonidine was ordered for possible withdrawal symptoms.  He demanded that the doctor be called, and that his klonopin be ordered. He stated that he took Klonopin for anxiety.  He was offered Ativan instead.  Hr refused it. It was explained to him numerous times that the doctor would be seeing him tomorrow, and at  that time he could talk about what medications he usually takes.  He stated he was going to call a lawyer.  " Isn't it against the law for a doctor to change medications, without talking it over with the patient?"  He was becoming more and more upset.  " I have taken those medications for 14 years."    RN Lashbrook called DR Mohsin and received a one time dose order for Klonopin.

## 2013-02-11 NOTE — Progress Notes (Signed)
Spoke with patient's nurse. CSB has placed patient in TDO. CSB to place patient. Will continue to follow. Talbot Grumbling, RN [phone].

## 2013-02-11 NOTE — Behavioral Health Treatment Team (Signed)
He approached this Clinical research associate stating " Isn't it against the law for Doctors to change medications without talking to the patient first? "  He was informed by this Clinical research associate that it was already discussed with him, and that it was not going to be discussed again. He had been informed earlier that no further discussion about that subject would be  held.  He told this Clinical research associate not to raise a voice to him.  " If you want to yell at patients you should have another job."  This Clinical research associate did not raise her voice, just attempted to explain to him again that the subject would not be discussed any further.

## 2013-02-12 ENCOUNTER — Inpatient Hospital Stay
Admit: 2013-02-12 | Discharge: 2013-02-14 | Disposition: A | Discharge status: Home / Self Care | Payer: MEDICARE | Attending: Psychiatry | Admitting: Psychiatry

## 2013-02-12 LAB — LAMOTRIGINE (LAMICTAL): Lamotrigine: 2.5 ug/mL (ref 2.0–20.0)

## 2013-02-12 MED ADMIN — clonazePAM (KLONOPIN) tablet 0.5 mg: ORAL | @ 03:00:00 | NDC 63739026310

## 2013-02-12 MED ADMIN — Enter patient's height and weight into Connect Care: @ 20:00:00 | NDC 00740100004

## 2013-02-12 MED ADMIN — levETIRAcetam (KEPPRA) tablet 500 mg: ORAL | @ 15:00:00 | NDC 68084033711

## 2013-02-12 MED ADMIN — phenytoin (DILANTIN) tablet 100 mg: ORAL | @ 18:00:00 | NDC 00071000740

## 2013-02-12 MED ADMIN — lisinopril (PRINIVIL, ZESTRIL) tablet 20 mg: ORAL | @ 15:00:00 | NDC 68084019811

## 2013-02-12 MED ADMIN — lamoTRIgine (LAMICTAL) tablet 150 mg: ORAL | @ 15:00:00 | NDC 68084031911

## 2013-02-12 MED ADMIN — levETIRAcetam (KEPPRA) tablet 500 mg: ORAL | @ 03:00:00 | NDC 68084033711

## 2013-02-12 MED ADMIN — levETIRAcetam (KEPPRA) tablet 1,000 mg: ORAL | @ 16:00:00 | NDC 82009012205

## 2013-02-12 MED ADMIN — hydrochlorothiazide (HYDRODIURIL) tablet 25 mg: ORAL | @ 15:00:00 | NDC 68084008611

## 2013-02-12 NOTE — Behavioral Health Treatment Team (Signed)
GROUP THERAPY PROGRESS NOTE    [name] is not participating in med group.     Group time: 30 minutes    Personal goal for participation: refused    Goal orientation: medication education    Group therapy participation: refused    Therapeutic interventions reviewed and discussed: Knowing your illness,meds, side effects and compliance.    Impression of participation: offered/ pt not interested.

## 2013-02-12 NOTE — Behavioral Health Treatment Team (Signed)
Pt called up for morning medication and he went back to sleep. Currently appears to be sleeping

## 2013-02-12 NOTE — Behavioral Health Treatment Team (Signed)
Pt has been in bed for the entire shift. Stated to medication nurse at 2200 that he is "alright". Clonidine scheduled at 2200 with held rt low BP and HR. CIWA and COW scores 3 and 2 respectively. Pt had 2 episodes of diaphoresis this shift. Water taken to pt bedside to promote fluid intake. Pt did not eat supper. Will continue vital signs every 4 hours while awake to coincide with Clonidine administration. Also will monitor po intake and nutrition. No evidence of acute benzodiazepine withdrawal.  In reviewing notes for today and comparing to admission information given to this writer continues evident that pt is a very unreliable historian. Had told this Clinical research associate no prior [employer], today voiced having been in "[place]". Also told this Clinical research associate was being followed outpatient "In [place]", which is apparently not the case. Medication list from [employer] has not been received as yet.

## 2013-02-12 NOTE — Behavioral Health Treatment Team (Cosign Needed)
GROUP THERAPY PROGRESS NOTE    [name] was encouraged by staff but refused to participate in Community.     Group time: 30 minutes    Personal goal for participation: discuss unit issues and guideline compliance, verify insight and orientation    Goal orientation: personal    Group therapy participation: none

## 2013-02-12 NOTE — Behavioral Health Treatment Team (Signed)
GROUP THERAPY PROGRESS NOTE    [name]  is not participating in [place].     Group time: 30 minutes    Personal goal for participation: Discussing issues/guidelines    Goal orientation: social    Group therapy participation: refused group    Impression of participation: Patient stayed in room during group

## 2013-02-12 NOTE — Progress Notes (Signed)
Problem: Suicide/Homicide (Adult/Pediatric)  Goal: *STG: Remains safe in hospital  Outcome: Progressing Towards Goal  No attempts to harm self or others.   Goal: *STG: Attends activities and groups  Outcome: Not Progressing Towards Goal  Not participating   Goal: *STG/LTG: Complies with medication therapy  Outcome: Progressing Towards Goal  Compliant   Goal: *STG/LTG: No longer expresses self destructive or suicidal/homicidal thoughts  Outcome: Not Progressing Towards Goal  Pt is denying suicidal ideations or an attempt but he was admitted to ICU at [facility] for dilaudid OD.    Problem: Falls - Risk of  Goal: *Absence of falls  Outcome: Progressing Towards Goal  No falls     Comments:   Pt is very easily agitated and lacks insight. He doesn't believe he needs to be here. He became very upset with this nurse and the doctor today about his medications not being right  threatening to sue. It was explained to him that the doctor had just seen him and had not had time to put orders in yet. He wanted to speak with MD again. Pt was given pencil and paper and advised to write down his medications and doses. The list was given to Dr. Flo Shanks but Dr. Flo Shanks already had the list and was in the process of ordering medications . He would not accept any explanations and kept saying it was against the law for the doctor to change his medicine without telling him and he knows his rights. 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 Pt was admitted to [facility] ICU prior to this admission for a dilaudid OD but he denies attempting suicide. He has spent most of the day in bed but has eaten his meals and taken scheduled medications. Once he realized his usual medications were ordered he calmed down. His face and arms are red in color but states it is sunburn. He has refused to take any Clonidine stating he does not take clonidine. Explained to him it was for opiate withdrawals several times but he just kept  saying he doesn't take that.

## 2013-02-12 NOTE — Behavioral Health Treatment Team (Signed)
GROUP THERAPY PROGRESS NOTE    [name]  is not participating in Recreational Therapy.     Group time: 30 minutes    Personal goal for participation: To relieve stress and for patients to interact with one another outside of the unit    Goal orientation: relaxation    Group therapy participation: not active in group    Impression of participation: Patient was not involved in recreational group
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
## 2013-02-12 NOTE — Other (Signed)
Awake in bed when approached, was initially thoughtful as if trying to remember why he had asked for the supervisor. Stated smiling that today was his birthday & that he hoped to be released tomorrow & would celebrate then. Explained court procedure to client. He then stated he felt the nursing staff had unfairly raised their voices to him when he wanted to go over a list of medicines he had written down. Stated he had since discussed this with the doctor & he had no further complaint.

## 2013-02-13 LAB — LEVETIRACETAM (KEPPRA): Levetiracetam (Keppra): 12.7 ug/mL (ref 5.0–63.0)

## 2013-02-13 MED ADMIN — hydrochlorothiazide (HYDRODIURIL) tablet 25 mg: ORAL | @ 12:00:00 | NDC 68084008611

## 2013-02-13 MED ADMIN — lisinopril (PRINIVIL, ZESTRIL) tablet 20 mg: ORAL | @ 12:00:00 | NDC 68084019811

## 2013-02-13 MED ADMIN — phenytoin (DILANTIN) tablet 100 mg: ORAL | @ 18:00:00 | NDC 00071000740

## 2013-02-13 MED ADMIN — phenytoin (DILANTIN) tablet 100 mg: ORAL | @ 12:00:00 | NDC 51079012901

## 2013-02-13 MED ADMIN — levETIRAcetam (KEPPRA) tablet 1,500 mg: ORAL | @ 12:00:00 | NDC 68084033711

## 2013-02-13 MED ADMIN — Enter patient's height and weight into Connect Care: @ 01:00:00

## 2013-02-13 MED ADMIN — levETIRAcetam (KEPPRA) tablet 1,500 mg: ORAL | @ 02:00:00 | NDC 68084033711

## 2013-02-13 MED ADMIN — cloNIDine (CATAPRES) tablet 0.1 mg: ORAL | @ 15:00:00 | NDC 62584065711

## 2013-02-13 MED ADMIN — phenytoin (DILANTIN) tablet 100 mg: ORAL | @ 02:00:00 | NDC 00071000740

## 2013-02-13 MED ADMIN — lamoTRIgine (LAMICTAL) tablet 150 mg: ORAL | @ 12:00:00 | NDC 68084031911

## 2013-02-13 MED ADMIN — cloNIDine (CATAPRES) tablet 0.1 mg: ORAL | @ 02:00:00 | NDC 62584065711

## 2013-02-13 MED ADMIN — lamoTRIgine (LAMICTAL) tablet 150 mg: ORAL | @ 02:00:00 | NDC 68084031911

## 2013-02-13 NOTE — Other (Signed)
SOCIAL WORK GROUP THERAPY PROGRESS NOTE    Group Time:  10:15 am    Group Topic:  Coping Skills    Group Participation:  Pt refused to attend despite staff support.

## 2013-02-13 NOTE — Behavioral Health Treatment Team (Signed)
GROUP THERAPY PROGRESS NOTE    [name]  Pt refused to participate in recreational therapy despite staff encouragement.

## 2013-02-13 NOTE — Progress Notes (Signed)
Problem: Suicide/Homicide (Adult/Pediatric)  Goal: *STG/LTG: No longer expresses self destructive or suicidal/homicidal thoughts  Outcome: Progressing Towards Goal  Has not endorsed suicidal ideation for at least 48 hours.  Goal: *LTG: Identifies available community resources  Outcome: Progressing Towards Goal  Pt has outpatient provider.    Problem: Hypertension  Goal: *Blood pressure within specified parameters  Outcome: Progressing Towards Goal  Pt's BP has not been elevated since admission.    Comments:   Pt has been calm, cooperative and compliant with request for him to come out of room to day area. Bright affect, laughing appropriately at times. Denied any self/other harm thoughts. Looking forward to discharge tomorrow. Clonidine held rt BP and HR being low. No sx of any type of withdrawal. Voiced intent to follow up with outpatient provider after discharge.
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
## 2013-02-13 NOTE — Other (Addendum)
ACTIVITIES THERAPY PROGRESS NOTE    Group time:1220    The patient refused group.  In bed.

## 2013-02-13 NOTE — Behavioral Health Treatment Team (Signed)
GROUP THERAPY PROGRESS NOTE    [name] is not  participating in [place] group. Patient was asleep and notified about starting group time

## 2013-02-13 NOTE — Progress Notes (Signed)
Case d/w staff. Patient has been compliant with medications. He mentioned that he has h/o Bipolar d/o and agrees to try Risperdal. His sleep and appetite has improved. He is Voluntarily committed  through the court today. He denies SI/HI. affect is stable. Will start on Risperdal ELOS 1-2 days.

## 2013-02-13 NOTE — Behavioral Health Treatment Team (Signed)
Pt made aware this writer his staff person and encouraged to come out of room. Pt smiling and nodding at that time. Was asleep on approach.

## 2013-02-13 NOTE — Behavioral Health Treatment Team (Cosign Needed Addendum)
Nursing Note:     [name] was awake late and slow to motivate though talkative and approachable once out in the millue. Spoke with the team for court, says he wants to go home but will "Volunteer, I think they are going to make me do this." [name] has poor insight and minimizes all. Reported good sleep and no added issues. Vitals were within norm and appetite good.  Sitting in the day area watching the news at time of this entry.    13:32 - [name] has been unchanged from this morning, does participate in groups and activities but from a distance and shares minimally. Focused on leaving and going home. Noted very vocal with med intern, explained most of his life story. Mood stable, affect pleasant but with little insight and focus. No requests for treatment related information, no plan.

## 2013-02-14 MED ADMIN — phenytoin (DILANTIN) tablet 100 mg: ORAL | @ 18:00:00 | NDC 00071000740

## 2013-02-14 MED ADMIN — lamoTRIgine (LAMICTAL) tablet 150 mg: ORAL | @ 12:00:00 | NDC 68084031911

## 2013-02-14 MED ADMIN — phenytoin (DILANTIN) tablet 100 mg: ORAL | @ 12:00:00 | NDC 00071000740

## 2013-02-14 MED ADMIN — levETIRAcetam (KEPPRA) tablet 1,500 mg: ORAL | @ 12:00:00 | NDC 68084033711

## 2013-02-14 MED ADMIN — lamoTRIgine (LAMICTAL) tablet 150 mg: ORAL | @ 01:00:00 | NDC 68084031911

## 2013-02-14 MED ADMIN — phenytoin (DILANTIN) tablet 100 mg: ORAL | @ 01:00:00 | NDC 00071000740

## 2013-02-14 MED ADMIN — risperiDONE (RISPERDAL) tablet 2 mg: ORAL | @ 01:00:00 | NDC 68084027311

## 2013-02-14 MED ADMIN — levETIRAcetam (KEPPRA) tablet 1,500 mg: ORAL | @ 01:00:00 | NDC 68084033711

## 2013-02-14 MED ADMIN — hydrochlorothiazide (HYDRODIURIL) tablet 25 mg: ORAL | @ 12:00:00 | NDC 68084008611

## 2013-02-14 NOTE — Behavioral Health Treatment Team (Cosign Needed)
GROUP THERAPY PROGRESS NOTE    [name] is participating in [place].     Group time: 1 hour    Personal goal for participation: discuss unit issues and guideline compliance, verify insight and orientation    Goal orientation: personal    Group therapy participation: active

## 2013-02-14 NOTE — Behavioral Health Treatment Team (Signed)
SW collateral: SW assisted pt. With obtain a d/c appointment with [facility] [address] [place], [place] [phone]. Pt. Has an appointment for July 25,2014 @12 :00 with therapist and 03/17/13 with Daryll Drown.

## 2013-02-14 NOTE — Behavioral Health Treatment Team (Signed)
Pt discharged home to care of self; pt provided with HRT pass. Pt denies current SI. Pt's mood is happy with a bright affect. Out pt appts and medication doses and times reviewed with pt who verbalized understanding. Emergency numbers reviewed with pt who verbalized understanding. Belongings and valuables returned. No c/o voiced or concerns offered
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
## 2013-02-15 NOTE — Discharge Summary (Signed)
[facility]                               [address] [place], [place] [address]                           BEHAVIORAL MEDICINE SERVICES                               DISCHARGE SUMMARY    PATIENT:      [name], [name]  MRN:             [id number]    ADMIT:   02/11/2013  BILLING:         [id number] DSCH:    02/14/2013  ATTENDING:    Lewanda Rife, M.D.  DICTATING:    Lewanda Rife, M.D.      IDENTIFYING INFORMATION: The patient is a 27 year old male who was admitted  under temporary detention order secondary to overdose on Dilaudid. The  patient was involuntarily committed through court. For a detailed history,  please refer to admission note dictated on 02/12/2013.    HOSPITAL COURSE: During hospitalization, the patient was admitted to locked  unit under safety precautions. He was detoxed from opiates and the patient  was also started on Risperdal as he mentioned that he has been diagnosed  with bipolar disorder. The patient requested to leave on 02/14/2013. The  patient was not in imminent danger to self or others. He denied any  intention or plan of harming himself on the unit or outside the hospital.  He denied any psychotic symptoms. The patient was encouraged to stay sober.  He agreed to attend intensive outpatient program. The patient was  discharged to home after stabilization of acute crisis.    CONDITION ON DISCHARGE: Stable. The patient denied any intention or plan of  harming himself or others.    DISCHARGE DIAGNOSES:  Axis I:  1. Bipolar disorder by history.  2. Opiate dependence.  Axis II: Deferred.  Axis III:  1. History of traumatic brain injury.  2. Seizure disorder.  3. Hypertension.  Axis IV: Moderate psychosocial stressors.  Axis V: Global assessment of functioning of 60.    DISPOSITION: The patient was discharged to home.    PROGNOSIS: Fair with sobriety and treatment compliance.    DISCHARGE MEDICATIONS:  1.  Risperdal 2 mg by mouth at bedtime.  2. Hydrochlorothiazide 25 mg by mouth daily.  3. Lisinopril 20 mg by mouth daily.  4. Lamictal 150 mg by mouth twice daily.  5. Keppra 1500 mg by mouth twice daily.  6. Dilantin 100 mg by mouth 3 times daily.  7. Trazodone 50 mg by mouth at bedtime.    FOLLOWUP: The patient will follow up with his therapist, Daryll Drown,  on 03/17/2013 at 12 noon.                     Lewanda Rife, M.D.    MP:wmx  D: 02/14/2013 03:27 P T:02/15/2013  01:00 A  CQDocID #: [id number]  CScriptDoc #: [id number]  cc:    Lewanda Rife, M.D.

## 2013-02-21 ENCOUNTER — Inpatient Hospital Stay
Admit: 2013-02-21 | Discharge: 2013-02-23 | Disposition: A | Discharge status: Home / Self Care | Payer: MEDICARE | Attending: Psychiatry | Admitting: Psychiatry

## 2013-02-21 DIAGNOSIS — F3189 Other bipolar disorder: Secondary | ICD-10-CM

## 2013-02-21 LAB — METABOLIC PANEL, COMPREHENSIVE
A-G Ratio: 1.5 (ref 0.8–1.7)
ALT (SGPT): 28 U/L (ref 12.0–78.0)
AST (SGOT): 16 U/L (ref 15–37)
Albumin: 4.1 g/dL (ref 3.4–5.0)
Alk. phosphatase: 101 U/L (ref 45–117)
Anion gap: 4 mmol/L (ref 3.0–18)
BUN/Creatinine ratio: 10 — ABNORMAL LOW (ref 12–20)
BUN: 8 MG/DL (ref 7.0–18)
Bilirubin, total: 0.2 MG/DL (ref 0.2–1.0)
CO2: 29 mmol/L (ref 21–32)
Calcium: 9 MG/DL (ref 8.5–10.1)
Chloride: 109 mmol/L — ABNORMAL HIGH (ref 100–108)
Creatinine: 0.79 MG/DL (ref 0.6–1.3)
GFR est AA: >60 mL/min/{1.73_m2} (ref >60)
GFR est non-AA: >60 mL/min/{1.73_m2} (ref >60)
Globulin: 2.8 g/dL (ref 2.0–4.0)
Glucose: 88 mg/dL (ref 74–99)
Potassium: 3.4 mmol/L — ABNORMAL LOW (ref 3.5–5.5)
Protein, total: 6.9 g/dL (ref 6.4–8.2)
Sodium: 142 mmol/L (ref 136–145)

## 2013-02-21 LAB — METABOLIC PANEL, BASIC
Anion gap: 5 mmol/L (ref 3.0–18)
BUN/Creatinine ratio: 10 — ABNORMAL LOW (ref 12–20)
BUN: 8 MG/DL (ref 7.0–18)
CO2: 29 mmol/L (ref 21–32)
Calcium: 9 MG/DL (ref 8.5–10.1)
Chloride: 109 mmol/L — ABNORMAL HIGH (ref 100–108)
Creatinine: 0.8 MG/DL (ref 0.6–1.3)
GFR est AA: >60 mL/min/{1.73_m2} (ref >60)
GFR est non-AA: >60 mL/min/{1.73_m2} (ref >60)
Glucose: 82 mg/dL (ref 74–99)
Potassium: 4.1 mmol/L (ref 3.5–5.5)
Sodium: 143 mmol/L (ref 136–145)

## 2013-02-21 LAB — CBC WITH AUTOMATED DIFF
ABS. BASOPHILS: 0 10*3/uL (ref 0.0–0.06)
ABS. BASOPHILS: 0 10*3/uL (ref 0.0–0.06)
ABS. EOSINOPHILS: 0.2 10*3/uL (ref 0.0–0.4)
ABS. EOSINOPHILS: 0.1 10*3/uL (ref 0.0–0.4)
ABS. LYMPHOCYTES: 3.2 10*3/uL (ref 0.9–3.6)
ABS. LYMPHOCYTES: 2.2 10*3/uL (ref 0.9–3.6)
ABS. MONOCYTES: 0.5 10*3/uL (ref 0.05–1.2)
ABS. MONOCYTES: 0.5 10*3/uL (ref 0.05–1.2)
ABS. NEUTROPHILS: 5.6 10*3/uL (ref 1.8–8.0)
ABS. NEUTROPHILS: 2.7 10*3/uL (ref 1.8–8.0)
BASOPHILS: 0 % (ref 0–2)
BASOPHILS: 0 % (ref 0–2)
EOSINOPHILS: 2 % (ref 0–5)
EOSINOPHILS: 2 % (ref 0–5)
HCT: 37.8 % (ref 36.0–48.0)
HCT: 37.9 % (ref 36.0–48.0)
HGB: 13 g/dL (ref 13.0–16.0)
HGB: 12.9 g/dL — ABNORMAL LOW (ref 13.0–16.0)
LYMPHOCYTES: 34 % (ref 21–52)
LYMPHOCYTES: 40 % (ref 21–52)
MCH: 30 PG (ref 24.0–34.0)
MCH: 29.9 PG (ref 24.0–34.0)
MCHC: 34.4 g/dL (ref 31.0–37.0)
MCHC: 34 g/dL (ref 31.0–37.0)
MCV: 87.1 FL (ref 74.0–97.0)
MCV: 87.7 FL (ref 74.0–97.0)
MONOCYTES: 6 % (ref 3–10)
MONOCYTES: 10 % (ref 3–10)
MPV: 10.2 FL (ref 9.2–11.8)
MPV: 10.4 FL (ref 9.2–11.8)
NEUTROPHILS: 58 % (ref 40–73)
NEUTROPHILS: 48 % (ref 40–73)
PLATELET: 224 10*3/uL (ref 135–420)
PLATELET: 203 10*3/uL (ref 135–420)
RBC: 4.34 M/uL — ABNORMAL LOW (ref 4.70–5.50)
RBC: 4.32 M/uL — ABNORMAL LOW (ref 4.70–5.50)
RDW: 13.5 % (ref 11.6–14.5)
RDW: 13.6 % (ref 11.6–14.5)
WBC: 9.5 10*3/uL (ref 4.6–13.2)
WBC: 5.5 10*3/uL (ref 4.6–13.2)

## 2013-02-21 LAB — PHENYTOIN
Phenytoin: 10.7 ug/mL (ref 10.0–20.0)
Phenytoin: 9.8 ug/mL — ABNORMAL LOW (ref 10.0–20.0)

## 2013-02-21 LAB — ETHYL ALCOHOL: ALCOHOL(ETHYL),SERUM: <3 MG/DL (ref 0–3)

## 2013-02-21 LAB — DRUG SCREEN, URINE
AMPHETAMINES: NEGATIVE
BARBITURATES: NEGATIVE
BENZODIAZEPINES: NEGATIVE
COCAINE: NEGATIVE
METHADONE: NEGATIVE
OPIATES: NEGATIVE
PCP(PHENCYCLIDINE): NEGATIVE
THC (TH-CANNABINOL): POSITIVE — AB

## 2013-02-21 LAB — MAGNESIUM: Magnesium: 1.9 mg/dL (ref 1.8–2.4)

## 2013-02-21 MED ADMIN — potassium chloride (K-DUR, KLOR-CON) SR tablet 40 mEq: ORAL | @ 08:00:00 | NDC 68084036011

## 2013-02-21 MED ADMIN — LORazepam (ATIVAN) injection 1 mg: INTRAVENOUS | @ 06:00:00 | NDC 00641604401

## 2013-02-21 MED ADMIN — phenytoin (DILANTIN) tablet 100 mg: ORAL | @ 21:00:00 | NDC 00071000740

## 2013-02-21 NOTE — ED Notes (Signed)
Security called for transport

## 2013-02-21 NOTE — ED Notes (Signed)
Patient: [name]      I have reviewed the chart and discussed the patient with the PA-C. I have reviewed any pertinent labs or imaging studies if performed. We have discussed discharge instructions and follow up care with the appropriate provider.     Velora Mediate, MD Armando Gang
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
## 2013-02-21 NOTE — ED Notes (Signed)
Patient refuses to go through medication list, charge nurse aware patient refuses to answer questions, sign discharge papers.

## 2013-02-21 NOTE — ED Notes (Signed)
Patient sleeping comfortably on stretcher, easily aroused. NO seizure activity noted at this time. Patient's vital signs are stable, no acute distress noted. NO new complaints noted at this time. Patient awaiting to be discharged.

## 2013-02-21 NOTE — Behavioral Health Treatment Team (Signed)
Pt admitted to unit at 1500.  At time of admission pt stated that he was depressed and was suicidal without a plan.  Pt denies pain.  At the present time pt is on the unit socializing with peers.  Mood and affect is labile.

## 2013-02-21 NOTE — ED Notes (Signed)
Just discharged from here for seizures. Wants admission for depression/suicidal ideation  recent loss of best friend

## 2013-02-21 NOTE — ED Notes (Signed)
Pt resting on stretcher in stable condition eating breakfast tray. Pt stable will continue to monitor

## 2013-02-21 NOTE — ED Notes (Signed)
I have reviewed discharge instructions with the patient.  The patient verbalized understanding. Patient refuses to sign discharge papers. Patient looks comfortable, no seizure activity noted.

## 2013-02-21 NOTE — ED Notes (Signed)
Patient states he had 3 seizures starting at midnight, Patient states he's complaint with his seizure medication. Awaiting further instructions from provider.

## 2013-02-21 NOTE — ED Notes (Signed)
Pt states" I was just discharged for seizures from here and I thought I could, just walk over to behavioral med, but low and behold, you have to come through the ED" Pt states depression with sucidal thoughts since loss of a friend pt states " I wouldn't say that Im there yet as far as wanting to commit suicide, but I do have thoughts about it" Pt changed into gown and red socks all belongings removed from bedside. Pt cooperative at this time. Will continue to monitor
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
## 2013-02-21 NOTE — Behavioral Health Treatment Team (Cosign Needed)
GROUP THERAPY PROGRESS NOTE    [name] is participating in [facility].     Group time: 45 minutes    Personal goal for participation: "I don't know, I guess to get my medications together, how do you get over your best friend dying?!"    Goal orientation: community    Group therapy participation: active    Therapeutic interventions reviewed and discussed: Tasked to focus and plan for home, to identify triggers and create a "bag" of coping skills.    Impression of participation: Poor insight, poor focus and with no plan for here or home.
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
## 2013-02-21 NOTE — ED Notes (Signed)
Patient states he had a seizure x today.

## 2013-02-21 NOTE — ED Notes (Signed)
Patient resting comfortably on stretcher, no acute distress noted. No new complaints noted at this time. Awaiting further instructions from provider.

## 2013-02-21 NOTE — ED Notes (Signed)
Crisis at bedside.

## 2013-02-22 MED ADMIN — levETIRAcetam (KEPPRA) tablet 500 mg: ORAL | @ 01:00:00 | NDC 68084033711

## 2013-02-22 MED ADMIN — phenytoin (DILANTIN) tablet 100 mg: ORAL | @ 01:00:00 | NDC 00071000740

## 2013-02-22 MED ADMIN — simvastatin (ZOCOR) tablet 10 mg: ORAL | @ 01:00:00 | NDC 68084051111

## 2013-02-22 MED ADMIN — risperiDONE (RISPERDAL) tablet 2 mg: ORAL | @ 01:00:00 | NDC 68084027311

## 2013-02-22 MED ADMIN — lamoTRIgine (LAMICTAL) tablet 150 mg: ORAL | @ 01:00:00 | NDC 68084031811

## 2013-02-22 MED ADMIN — lamoTRIgine (LAMICTAL) tablet 150 mg: ORAL | @ 12:00:00 | NDC 68084031911

## 2013-02-22 MED ADMIN — phenytoin (DILANTIN) tablet 100 mg: ORAL | @ 12:00:00 | NDC 00071000740

## 2013-02-22 MED ADMIN — levETIRAcetam (KEPPRA) tablet 500 mg: ORAL | @ 12:00:00 | NDC 68084033711

## 2013-02-22 MED ADMIN — phenytoin (DILANTIN) tablet 100 mg: ORAL | @ 17:00:00 | NDC 00071000740

## 2013-02-22 MED ADMIN — hydrochlorothiazide (HYDRODIURIL) tablet 25 mg: ORAL | @ 12:00:00 | NDC 68084008611

## 2013-02-22 MED ADMIN — lisinopril (PRINIVIL, ZESTRIL) tablet 20 mg: ORAL | @ 12:00:00 | NDC 68084019811

## 2013-02-22 NOTE — Behavioral Health Treatment Team (Signed)
Pt did not attend nursing group despite staff encouragement.

## 2013-02-22 NOTE — Progress Notes (Signed)
Problem: Seizure Disorder (Adult)  Goal: *STG/LTG: Complies with medication therapy  Outcome: Progressing Towards Goal  Taking medications as ordered.
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
## 2013-02-22 NOTE — Behavioral Health Treatment Team (Cosign Needed)
Patient in day area most of shift interacting with others on unit.Did attend group.Taliked about the loss of a friend causing him to come in here.This afternoon has been in room napping but overall behavior had been appropriate.Will continue on fall risks by wearing proper footies and clothing.
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
## 2013-02-22 NOTE — Behavioral Health Treatment Team (Signed)
GROUP THERAPY PROGRESS NOTE    [name] is participating in SLEEP PATTERN GROUP.     Group time: 30 minutes    Personal goal for participation: NONE     Goal orientation: personal    Group therapy participation: passive    Therapeutic interventions reviewed and discussed:     Impression of participation: DECLINED GROUP

## 2013-02-22 NOTE — Other (Signed)
SW Contact: This SW met with the patient on the Adult Unit this morning to discuss his current admission.  The patient reports experiencing depression and suicidal ideation due to recent death of a friend.  The patient reports visiting in [place] and does reside in [place] with his mother which he plans to return upon discharge.  The patient reports receiving outpatient treatment with Ignacia Felling, LCSW in [place] with plan to follow up.  He was not able to provide this SW with phone# of agency.  The patient was relaxing in his bed at the time of this assessment and asked if this SW could return later today to finish the assessment due to wanting to go to sleep.  SW will follow up patient in regards to discharge plan.

## 2013-02-22 NOTE — Behavioral Health Treatment Team (Signed)
GROUP THERAPY PROGRESS NOTE    [name] is participating in Recreational Therapy.     Group time: 30 minutes    Personal goal for participation: socializing    Goal orientation: social    Group therapy participation: active    Therapeutic interventions reviewed and discussed:     Impression of participation:  [place] was engaged talking,  socially with peers and staff while watching a movie

## 2013-02-22 NOTE — Other (Signed)
ACTIVITIES THERAPY PROGRESS NOTE    Group time:1540      The patient did not awaken when called for group.

## 2013-02-22 NOTE — Behavioral Health Treatment Team (Signed)
.  Treatment team met -     Medical Director:                                ___x__present        Psychiatrist:                                        ___x__present      Charge nurse:                                     __x___present          Lead Social Worker:                           __x___present      LCSW:                                                _____present      Administrative Director:                      _____present      Nurse Manager:                                  __x___present      Student RNs:                                      __x___present      Medical Students:                               ___x__present      Art Therapist:                                      __x___present   Clinical Coordinator:                           _____present   Internal Case Manager:                      __x___present        Plan of care discussed and updated as appropriate.

## 2013-02-23 MED ADMIN — lisinopril (PRINIVIL, ZESTRIL) tablet 20 mg: ORAL | @ 13:00:00 | NDC 68084019811

## 2013-02-23 MED ADMIN — levETIRAcetam (KEPPRA) tablet 1,500 mg: ORAL | @ 13:00:00 | NDC 68084033711

## 2013-02-23 MED ADMIN — levETIRAcetam (KEPPRA) tablet 500 mg: ORAL | @ 01:00:00 | NDC 68084033711

## 2013-02-23 MED ADMIN — simvastatin (ZOCOR) tablet 10 mg: ORAL | @ 01:00:00 | NDC 68084051111

## 2013-02-23 MED ADMIN — hydrochlorothiazide (HYDRODIURIL) tablet 25 mg: ORAL | @ 13:00:00 | NDC 68084008611

## 2013-02-23 MED ADMIN — lamoTRIgine (LAMICTAL) tablet 150 mg: ORAL | @ 01:00:00 | NDC 68084031911

## 2013-02-23 MED ADMIN — traZODone (DESYREL) tablet 50 mg: ORAL | @ 01:00:00 | NDC 00904399061

## 2013-02-23 MED ADMIN — levETIRAcetam (KEPPRA) tablet 1,500 mg: ORAL | @ 02:00:00 | NDC 68084033711

## 2013-02-23 MED ADMIN — phenytoin (DILANTIN) tablet 100 mg: ORAL | @ 01:00:00 | NDC 00071000740

## 2013-02-23 MED ADMIN — phenytoin (DILANTIN) tablet 100 mg: ORAL | @ 17:00:00 | NDC 00071000740

## 2013-02-23 MED ADMIN — lamoTRIgine (LAMICTAL) tablet 150 mg: ORAL | @ 13:00:00 | NDC 68084031911

## 2013-02-23 MED ADMIN — risperiDONE (RISPERDAL) tablet 2 mg: ORAL | @ 01:00:00 | NDC 68084027311

## 2013-02-23 MED ADMIN — phenytoin (DILANTIN) tablet 100 mg: ORAL | @ 13:00:00 | NDC 00071000740

## 2013-02-23 NOTE — Behavioral Health Treatment Team (Signed)
Patient discharged today to home. Patient informed staff he would like to go. MD called and orders placed. Patient denies any thoughts of suicide or homicide.

## 2013-02-23 NOTE — Progress Notes (Signed)
Problem: Falls - Risk of  Goal: *Absence of falls  Outcome: Progressing Towards Goal  Patient has not fallen this shift. Will continue to monitor for any further changes in gait. Patient does have non skid sock on. Noted gait is steady.

## 2013-02-23 NOTE — Behavioral Health Treatment Team (Signed)
GROUP THERAPY PROGRESS NOTE    [name] is participating in Depression Video.     Group time: 35 minutes    Personal goal for participation: "I just want to get help for my depression and work through it".    Goal orientation: personal    Group therapy participation: active    Therapeutic interventions reviewed and discussed: Pt was encouraged to only focus on himself and deal with his issues and prioritize what he want to work on first.    Impression of participation: Pt was alert and oriented during group and appear to have family issues that he have to deal with upon discharge.
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
## 2013-02-23 NOTE — Behavioral Health Treatment Team (Signed)
[name] reports that he feels better.  He slept well and is less depressed.  He denied suicide and homicide ideation.  Thought processes are intact.  He is compliant with his mediations which have no side effects subjectively.  While there is no active suicide ideation there is passive suicide ideation.  Feelings of being hopeless and helpless are less pronounced.  He is responding well to treatment.    Will expect discharge within 48 hours.

## 2015-03-15 ENCOUNTER — Inpatient Hospital Stay
Admit: 2015-03-15 | Discharge: 2015-03-15 | Disposition: A | Discharge status: Home / Self Care | Payer: MEDICARE | Attending: Emergency Medicine

## 2015-03-15 DIAGNOSIS — L03114 Cellulitis of left upper limb: Secondary | ICD-10-CM

## 2015-03-15 LAB — METABOLIC PANEL, COMPREHENSIVE
A-G Ratio: 1.3 (ref 1.1–2.2)
ALT (SGPT): 41 U/L (ref 12–78)
AST (SGOT): 22 U/L (ref 15–37)
Albumin: 4.3 g/dL (ref 3.5–5.0)
Alk. phosphatase: 98 U/L (ref 45–117)
Anion gap: 8 mmol/L (ref 5–15)
BUN/Creatinine ratio: 19 (ref 12–20)
BUN: 17 MG/DL (ref 6–20)
Bilirubin, total: 0.8 MG/DL (ref 0.2–1.0)
CO2: 27 mmol/L (ref 21–32)
Calcium: 9 MG/DL (ref 8.5–10.1)
Chloride: 106 mmol/L (ref 97–108)
Creatinine: 0.89 MG/DL (ref 0.70–1.30)
GFR est AA: >60 mL/min/{1.73_m2} (ref >60)
GFR est non-AA: >60 mL/min/{1.73_m2} (ref >60)
Globulin: 3.2 g/dL (ref 2.0–4.0)
Glucose: 78 mg/dL (ref 65–100)
Potassium: 3.9 mmol/L (ref 3.5–5.1)
Protein, total: 7.5 g/dL (ref 6.4–8.2)
Sodium: 141 mmol/L (ref 136–145)

## 2015-03-15 LAB — CBC WITH AUTOMATED DIFF
ABS. BASOPHILS: 0 10*3/uL (ref 0.0–0.1)
ABS. EOSINOPHILS: 0.2 10*3/uL (ref 0.0–0.4)
ABS. LYMPHOCYTES: 2 10*3/uL (ref 0.8–3.5)
ABS. MONOCYTES: 0.9 10*3/uL (ref 0.0–1.0)
ABS. NEUTROPHILS: 6.6 10*3/uL (ref 1.8–8.0)
BASOPHILS: 0 % (ref 0–1)
EOSINOPHILS: 2 % (ref 0–7)
HCT: 44.4 % (ref 36.6–50.3)
HGB: 14.8 g/dL (ref 12.1–17.0)
LYMPHOCYTES: 21 % (ref 12–49)
MCH: 30.7 PG (ref 26.0–34.0)
MCHC: 33.3 g/dL (ref 30.0–36.5)
MCV: 92.1 FL (ref 80.0–99.0)
MONOCYTES: 9 % (ref 5–13)
NEUTROPHILS: 68 % (ref 32–75)
PLATELET: 236 10*3/uL (ref 150–400)
RBC: 4.82 M/uL (ref 4.10–5.70)
RDW: 13 % (ref 11.5–14.5)
WBC: 9.7 10*3/uL (ref 4.1–11.1)

## 2015-03-15 LAB — CK: CK: 360 U/L — ABNORMAL HIGH (ref 39–308)

## 2015-03-15 LAB — C REACTIVE PROTEIN, QT: C-Reactive protein: 4.52 mg/dL — ABNORMAL HIGH (ref 0.00–0.60)

## 2015-03-15 LAB — SED RATE (ESR): Sed rate, automated: 4 mm/hr (ref 0–15)

## 2015-03-15 MED ORDER — CEFAZOLIN 2 GRAM/50 ML NS IVPB
Freq: Once | INTRAVENOUS | Status: AC
Start: 2015-03-15 — End: 2015-03-15
  Administered 2015-03-15: 18:00:00 via INTRAVENOUS

## 2015-03-15 MED ORDER — MORPHINE 10 MG/ML INJ SOLUTION
10 mg/ml | Freq: Once | INTRAMUSCULAR | Status: AC
Start: 2015-03-15 — End: 2015-03-15
  Administered 2015-03-15: 18:00:00 via INTRAVENOUS

## 2015-03-15 MED ORDER — LIDOCAINE HCL 1 % (10 MG/ML) IJ SOLN
10 mg/mL (1 %) | Freq: Once | INTRAMUSCULAR | Status: AC
Start: 2015-03-15 — End: 2015-03-15
  Administered 2015-03-15: 18:00:00 via INTRADERMAL

## 2015-03-15 MED ORDER — ONDANSETRON (PF) 4 MG/2 ML INJECTION
4 mg/2 mL | Freq: Once | INTRAMUSCULAR | Status: AC
Start: 2015-03-15 — End: 2015-03-15
  Administered 2015-03-15: 18:00:00 via INTRAVENOUS

## 2015-03-15 MED ORDER — DOXYCYCLINE HYCLATE 100 MG TAB
100 mg | ORAL_TABLET | Freq: Two times a day (BID) | ORAL | 0 refills | Status: AC
Start: 2015-03-15 — End: 2015-03-22

## 2015-03-15 MED ORDER — OXYCODONE-ACETAMINOPHEN 5 MG-325 MG TAB
5-325 mg | ORAL_TABLET | ORAL | 0 refills | Status: DC | PRN
Start: 2015-03-15 — End: 2015-07-19

## 2015-03-15 MED FILL — LIDOCAINE HCL 1 % (10 MG/ML) IJ SOLN: 10 mg/mL (1 %) | INTRAMUSCULAR | Qty: 20

## 2015-03-15 MED FILL — ONDANSETRON (PF) 4 MG/2 ML INJECTION: 4 mg/2 mL | INTRAMUSCULAR | Qty: 4

## 2015-03-15 MED FILL — MORPHINE 10 MG/ML SYRINGE: 10 mg/mL | INTRAMUSCULAR | Qty: 1

## 2015-03-15 MED FILL — CEFAZOLIN 2 GRAM/50 ML NS IVPB: INTRAVENOUS | Qty: 50

## 2015-03-15 NOTE — ED Notes (Signed)
I have reviewed discharge instructions with the patient.  The patient verbalized understanding. Pt ambulatory to exit upon discharge.
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
## 2015-03-15 NOTE — ED Triage Notes (Signed)
Triage note: "I got bite my a spider, think it was a brown recluse". Three days ago. Pt with swelling to left elbow and forearm.

## 2015-03-18 ENCOUNTER — Inpatient Hospital Stay
Admit: 2015-03-18 | Discharge: 2015-03-18 | Disposition: A | Discharge status: Home / Self Care | Payer: MEDICARE | Attending: Emergency Medicine

## 2015-03-18 ENCOUNTER — Emergency Department: Admit: 2015-03-18 | Payer: MEDICARE

## 2015-03-18 DIAGNOSIS — R569 Unspecified convulsions: Secondary | ICD-10-CM

## 2015-03-18 LAB — CBC WITH AUTOMATED DIFF
ABS. BASOPHILS: 0.1 10*3/uL (ref 0.0–0.1)
ABS. EOSINOPHILS: 0.2 10*3/uL (ref 0.0–0.4)
ABS. LYMPHOCYTES: 1.4 10*3/uL (ref 0.8–3.5)
ABS. MONOCYTES: 0.2 10*3/uL (ref 0.0–1.0)
ABS. NEUTROPHILS: 4.7 10*3/uL (ref 1.8–8.0)
BASOPHILS: 2 % — ABNORMAL HIGH (ref 0–1)
EOSINOPHILS: 3 % (ref 0–7)
HCT: 39.1 % (ref 36.6–50.3)
HGB: 13.2 g/dL (ref 12.1–17.0)
LYMPHOCYTES: 21 % (ref 12–49)
MCH: 31 PG (ref 26.0–34.0)
MCHC: 33.8 g/dL (ref 30.0–36.5)
MCV: 91.8 FL (ref 80.0–99.0)
METAMYELOCYTES: 1 % — ABNORMAL HIGH
MONOCYTES: 3 % — ABNORMAL LOW (ref 5–13)
NEUTROPHILS: 70 % (ref 32–75)
PLATELET: 210 10*3/uL (ref 150–400)
RBC: 4.26 M/uL (ref 4.10–5.70)
RDW: 12.9 % (ref 11.5–14.5)
WBC: 6.7 10*3/uL (ref 4.1–11.1)

## 2015-03-18 LAB — METABOLIC PANEL, COMPREHENSIVE
A-G Ratio: 1.1 (ref 1.1–2.2)
ALT (SGPT): 26 U/L (ref 12–78)
AST (SGOT): 12 U/L — ABNORMAL LOW (ref 15–37)
Albumin: 3.6 g/dL (ref 3.5–5.0)
Alk. phosphatase: 86 U/L (ref 45–117)
Anion gap: 9 mmol/L (ref 5–15)
BUN/Creatinine ratio: 12 (ref 12–20)
BUN: 11 MG/DL (ref 6–20)
Bilirubin, total: 0.3 MG/DL (ref 0.2–1.0)
CO2: 25 mmol/L (ref 21–32)
Calcium: 8.4 MG/DL — ABNORMAL LOW (ref 8.5–10.1)
Chloride: 106 mmol/L (ref 97–108)
Creatinine: 0.89 MG/DL (ref 0.70–1.30)
GFR est AA: >60 mL/min/{1.73_m2} (ref >60)
GFR est non-AA: >60 mL/min/{1.73_m2} (ref >60)
Globulin: 3.4 g/dL (ref 2.0–4.0)
Glucose: 96 mg/dL (ref 65–100)
Potassium: 3.5 mmol/L (ref 3.5–5.1)
Protein, total: 7 g/dL (ref 6.4–8.2)
Sodium: 140 mmol/L (ref 136–145)

## 2015-03-18 LAB — PHENYTOIN: Phenytoin: <0.5 ug/mL — ABNORMAL LOW (ref 10.0–20.0)

## 2015-03-18 MED ORDER — PHENYTOIN SODIUM EXTENDED 100 MG CAP
100 mg | ORAL | Status: AC
Start: 2015-03-18 — End: 2015-03-18
  Administered 2015-03-18: 09:00:00 via ORAL

## 2015-03-18 MED ORDER — PHENYTOIN SODIUM EXTENDED 100 MG CAP
100 mg | ORAL_CAPSULE | Freq: Three times a day (TID) | ORAL | 0 refills | Status: AC
Start: 2015-03-18 — End: 2015-04-07

## 2015-03-18 MED FILL — DILANTIN EXTENDED 100 MG CAPSULE: 100 mg | ORAL | Qty: 3

## 2015-03-18 NOTE — ED Notes (Signed)
MD reviewed discharge instructions and options with patient and patient verbalized understanding. RN reviewed discharge instructions using teachback method. Pt ambulated to exit, declined wheelchair. No complaints or needs expressed at this time. Patient was counseled on medications prescribed at discharge. Patient to follow up with neurology.
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
## 2015-03-18 NOTE — ED Triage Notes (Signed)
Pt with hx of seizures after TBI. Pt with recent increase in breakthrough seizures, denies changes in meds. Pt with approximately 1 minute witness seizure this morning.

## 2015-03-18 NOTE — ED Notes (Signed)
Pt returned from CT. Pt remains awake and alert. Respirations even and unlabored, skin warm and dry.

## 2015-03-19 ENCOUNTER — Inpatient Hospital Stay
Admit: 2015-03-19 | Discharge: 2015-03-19 | Disposition: A | Discharge status: Home / Self Care | Payer: MEDICARE | Attending: Emergency Medicine

## 2015-03-19 DIAGNOSIS — G43009 Migraine without aura, not intractable, without status migrainosus: Secondary | ICD-10-CM

## 2015-03-19 LAB — LEVETIRACETAM (KEPPRA): Levetiracetam (Keppra): NOT DETECTED ug/mL (ref 10.0–40.0)

## 2015-03-19 MED ORDER — METOCLOPRAMIDE 5 MG/ML IJ SOLN
5 mg/mL | INTRAMUSCULAR | Status: AC
Start: 2015-03-19 — End: 2015-03-19
  Administered 2015-03-19: 08:00:00 via INTRAVENOUS

## 2015-03-19 MED ORDER — DIPHENHYDRAMINE HCL 50 MG/ML IJ SOLN
50 mg/mL | INTRAMUSCULAR | Status: AC
Start: 2015-03-19 — End: 2015-03-19
  Administered 2015-03-19: 08:00:00 via INTRAVENOUS

## 2015-03-19 MED ORDER — ONDANSETRON 8 MG TAB, RAPID DISSOLVE
8 mg | ORAL_TABLET | Freq: Three times a day (TID) | ORAL | 0 refills | Status: DC | PRN
Start: 2015-03-19 — End: 2015-07-19

## 2015-03-19 MED ORDER — NAPROXEN 500 MG TAB
500 mg | ORAL_TABLET | Freq: Two times a day (BID) | ORAL | 0 refills | Status: AC
Start: 2015-03-19 — End: 2015-03-29

## 2015-03-19 MED ORDER — KETOROLAC TROMETHAMINE 30 MG/ML INJECTION
30 mg/mL (1 mL) | INTRAMUSCULAR | Status: AC
Start: 2015-03-19 — End: 2015-03-19
  Administered 2015-03-19: 08:00:00 via INTRAVENOUS

## 2015-03-19 MED ORDER — SODIUM CHLORIDE 0.9% BOLUS IV
0.9 % | INTRAVENOUS | Status: AC
Start: 2015-03-19 — End: 2015-03-19
  Administered 2015-03-19: 08:00:00 via INTRAVENOUS

## 2015-03-19 MED FILL — KETOROLAC TROMETHAMINE 30 MG/ML INJECTION: 30 mg/mL (1 mL) | INTRAMUSCULAR | Qty: 1

## 2015-03-19 MED FILL — DIPHENHYDRAMINE HCL 50 MG/ML IJ SOLN: 50 mg/mL | INTRAMUSCULAR | Qty: 1

## 2015-03-19 MED FILL — SODIUM CHLORIDE 0.9 % IV: INTRAVENOUS | Qty: 1000

## 2015-03-19 MED FILL — METOCLOPRAMIDE 5 MG/ML IJ SOLN: 5 mg/mL | INTRAMUSCULAR | Qty: 2

## 2015-03-19 NOTE — ED Triage Notes (Signed)
Patient arrived via EMS from home reporting headache onset about 0230.  Patient was seen yesterday for a seizure and subsequent fall.  Patient was discharged after treatment and assessment.  Patient has a history of migraines as well.  Patient is photosensitive and sensitive to sound.

## 2015-03-19 NOTE — ED Notes (Signed)
Patient sleeping after migraine cocktail has been administered. I was able to arouse him to reposition his arm so IV fluids would continue to run, however he would not answer my question about whether his pain had improved.

## 2015-03-19 NOTE — ED Notes (Signed)
MD reviewed discharge instructions and options with patient; patient verbalized understanding. RN attempted to review discharge instructions using teachback method; patient not receptive to listening and resisted getting up to get dressed. Was finally persuaded to get up; signed the last page of discharge instructions to be scanned later. Pt. Ambulated to exit without difficulty escorted by Nicki Guadalajararicia K and in no signs of acute distress.Patient was counseled on medications prescribed at discharge. Patient stated that "he would find a way home" and refused offer to call somebody.
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
## 2015-04-01 ENCOUNTER — Emergency Department: Admit: 2015-04-01 | Payer: MEDICARE

## 2015-04-01 ENCOUNTER — Inpatient Hospital Stay
Admit: 2015-04-01 | Discharge: 2015-04-02 | Discharge status: Against Medical Advice | Payer: MEDICARE | Attending: Emergency Medicine

## 2015-04-01 DIAGNOSIS — G40109 Localization-related (focal) (partial) symptomatic epilepsy and epileptic syndromes with simple partial seizures, not intractable, without status epilepticus: Secondary | ICD-10-CM

## 2015-04-01 LAB — METABOLIC PANEL, BASIC
Anion gap: 8 mmol/L (ref 3.0–18)
BUN/Creatinine ratio: 18 (ref 12–20)
BUN: 15 MG/DL (ref 7.0–18)
CO2: 27 mmol/L (ref 21–32)
Calcium: 9.1 MG/DL (ref 8.5–10.1)
Chloride: 105 mmol/L (ref 100–108)
Creatinine: 0.83 MG/DL (ref 0.6–1.3)
GFR est AA: >60 mL/min/{1.73_m2} (ref >60)
GFR est non-AA: >60 mL/min/{1.73_m2} (ref >60)
Glucose: 85 mg/dL (ref 74–99)
Potassium: 3.4 mmol/L — ABNORMAL LOW (ref 3.5–5.5)
Sodium: 140 mmol/L (ref 136–145)

## 2015-04-01 LAB — CBC WITH AUTOMATED DIFF
ABS. BASOPHILS: 0 10*3/uL (ref 0.0–0.06)
ABS. EOSINOPHILS: 0.1 10*3/uL (ref 0.0–0.4)
ABS. LYMPHOCYTES: 1.9 10*3/uL (ref 0.9–3.6)
ABS. MONOCYTES: 0.9 10*3/uL (ref 0.05–1.2)
ABS. NEUTROPHILS: 5.5 10*3/uL (ref 1.8–8.0)
BASOPHILS: 0 % (ref 0–2)
EOSINOPHILS: 1 % (ref 0–5)
HCT: 40.1 % (ref 36.0–48.0)
HGB: 13.3 g/dL (ref 13.0–16.0)
LYMPHOCYTES: 23 % (ref 21–52)
MCH: 30.8 PG (ref 24.0–34.0)
MCHC: 33.2 g/dL (ref 31.0–37.0)
MCV: 92.8 FL (ref 74.0–97.0)
MONOCYTES: 11 % — ABNORMAL HIGH (ref 3–10)
MPV: 10.4 FL (ref 9.2–11.8)
NEUTROPHILS: 65 % (ref 40–73)
PLATELET: 200 10*3/uL (ref 135–420)
RBC: 4.32 M/uL — ABNORMAL LOW (ref 4.70–5.50)
RDW: 12.7 % (ref 11.6–14.5)
WBC: 8.4 10*3/uL (ref 4.6–13.2)

## 2015-04-01 LAB — MAGNESIUM: Magnesium: 1.9 mg/dL (ref 1.8–2.4)

## 2015-04-01 LAB — LIPASE: Lipase: 81 U/L (ref 73–393)

## 2015-04-01 LAB — HEPATIC FUNCTION PANEL
A-G Ratio: 1.5 (ref 0.8–1.7)
ALT (SGPT): 25 U/L (ref 16–61)
AST (SGOT): 15 U/L (ref 15–37)
Albumin: 4.4 g/dL (ref 3.4–5.0)
Alk. phosphatase: 88 U/L (ref 45–117)
Bilirubin, direct: 0.2 MG/DL (ref 0.0–0.2)
Bilirubin, total: 0.9 MG/DL (ref 0.2–1.0)
Globulin: 2.9 g/dL (ref 2.0–4.0)
Protein, total: 7.3 g/dL (ref 6.4–8.2)

## 2015-04-01 LAB — PHENYTOIN: Phenytoin: 47.5 ug/mL — CR (ref 10.0–20.0)

## 2015-04-01 LAB — ETHYL ALCOHOL: ALCOHOL(ETHYL),SERUM: <3 MG/DL (ref 0–3)

## 2015-04-01 MED ORDER — SODIUM CHLORIDE 0.9 % IV
INTRAVENOUS | Status: DC
Start: 2015-04-01 — End: 2015-04-06
  Administered 2015-04-02 – 2015-04-05 (×6): via INTRAVENOUS

## 2015-04-01 NOTE — Other (Addendum)
Admit pt from ED via stretcher, patient alert & oriented  X 4. Patient on seizures precautions.  Pt denies any pain at this time. No verbal distress noted.  Bed in lowest position & wheels locked. Call bell within reach.    2341 - Patient given healthy choice food. Patient is eating. No seizures activity noted.    0145 - Patient is eating pizza.    0310 - Patient requesting wash his hair, Valerie,cna assisted pt. No seizures activity noted.    1610 - Notified Dr. Thad Ranger for critical level of phenytoin level of 41.7, no further orders received.

## 2015-04-01 NOTE — ED Triage Notes (Addendum)
Pt arrived via EMS after being called by bystander for seizure. Pt with h/o seizures after traumatic brain injury at the age 29. Pt has been well controlled but reports 10 seizures in the last 3 days which pt reports accompanied by dizziness, balance problems, and headache. Pt on keppra, dilantin, and lamictal. Pt reports chronic low dilantin levels. Pt currently stuttering which is baseline postictal per pt report.

## 2015-04-01 NOTE — ED Notes (Signed)
Pt. Transported to CT

## 2015-04-01 NOTE — ED Notes (Signed)
Teleneurology at bedside
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
## 2015-04-02 LAB — URINALYSIS W/ RFLX MICROSCOPIC
Bilirubin: NEGATIVE
Blood: NEGATIVE
Glucose: NEGATIVE mg/dL
Leukocyte Esterase: NEGATIVE
Nitrites: NEGATIVE
Protein: NEGATIVE mg/dL
Specific gravity: >1.03 — ABNORMAL HIGH (ref 1.005–1.030)
Urobilinogen: 1 EU/dL (ref 0.2–1.0)
pH (UA): 6 (ref 5.0–8.0)

## 2015-04-02 LAB — METABOLIC PANEL, BASIC
Anion gap: 5 mmol/L (ref 3.0–18)
BUN/Creatinine ratio: 18 (ref 12–20)
BUN: 15 MG/DL (ref 7.0–18)
CO2: 31 mmol/L (ref 21–32)
Calcium: 8.6 MG/DL (ref 8.5–10.1)
Chloride: 106 mmol/L (ref 100–108)
Creatinine: 0.85 MG/DL (ref 0.6–1.3)
GFR est AA: >60 mL/min/{1.73_m2} (ref >60)
GFR est non-AA: >60 mL/min/{1.73_m2} (ref >60)
Glucose: 85 mg/dL (ref 74–99)
Potassium: 3.7 mmol/L (ref 3.5–5.5)
Sodium: 142 mmol/L (ref 136–145)

## 2015-04-02 LAB — PHENYTOIN: Phenytoin: 41.7 ug/mL — CR (ref 10.0–20.0)

## 2015-04-02 LAB — DRUG SCREEN, URINE
AMPHETAMINES: NEGATIVE
BARBITURATES: NEGATIVE
BENZODIAZEPINES: NEGATIVE
COCAINE: NEGATIVE
METHADONE: NEGATIVE
OPIATES: NEGATIVE
PCP(PHENCYCLIDINE): NEGATIVE
THC (TH-CANNABINOL): POSITIVE — AB

## 2015-04-02 LAB — EKG, 12 LEAD, INITIAL
Atrial Rate: 73 {beats}/min
Calculated P Axis: 58 degrees
Calculated R Axis: 3 degrees
Calculated T Axis: 11 degrees
Diagnosis: NORMAL
P-R Interval: 144 ms
Q-T Interval: 402 ms
QRS Duration: 96 ms
QTC Calculation (Bezet): 442 ms
Ventricular Rate: 73 {beats}/min

## 2015-04-02 MED ORDER — LEVETIRACETAM 500 MG TAB
500 mg | Freq: Two times a day (BID) | ORAL | Status: DC
Start: 2015-04-02 — End: 2015-04-06
  Administered 2015-04-02 – 2015-04-06 (×9): via ORAL

## 2015-04-02 MED ORDER — LISINOPRIL 20 MG TAB
20 mg | Freq: Every day | ORAL | Status: DC
Start: 2015-04-02 — End: 2015-04-06
  Administered 2015-04-02 – 2015-04-06 (×5): via ORAL

## 2015-04-02 MED ORDER — LAMOTRIGINE 100 MG TAB
100 mg | Freq: Two times a day (BID) | ORAL | Status: DC
Start: 2015-04-02 — End: 2015-04-06
  Administered 2015-04-02 – 2015-04-06 (×9): via ORAL

## 2015-04-02 MED ORDER — ACETAMINOPHEN 325 MG TABLET
325 mg | ORAL | Status: DC | PRN
Start: 2015-04-02 — End: 2015-04-06
  Administered 2015-04-02 (×2): via ORAL

## 2015-04-02 MED ORDER — HYDROCHLOROTHIAZIDE 25 MG TAB
25 mg | Freq: Every day | ORAL | Status: DC
Start: 2015-04-02 — End: 2015-04-06
  Administered 2015-04-02 – 2015-04-06 (×5): via ORAL

## 2015-04-02 MED ORDER — LOVASTATIN 20 MG TAB
20 mg | Freq: Every evening | ORAL | Status: DC
Start: 2015-04-02 — End: 2015-04-06
  Administered 2015-04-03 – 2015-04-06 (×4): via ORAL

## 2015-04-02 MED FILL — HYDROCHLOROTHIAZIDE 25 MG TAB: 25 mg | ORAL | Qty: 1

## 2015-04-02 MED FILL — LEVETIRACETAM 500 MG TAB: 500 mg | ORAL | Qty: 1

## 2015-04-02 MED FILL — TYLENOL 325 MG TABLET: 325 mg | ORAL | Qty: 2

## 2015-04-02 MED FILL — LISINOPRIL 20 MG TAB: 20 mg | ORAL | Qty: 1

## 2015-04-02 MED FILL — SODIUM CHLORIDE 0.9 % IV: INTRAVENOUS | Qty: 1000

## 2015-04-02 MED FILL — LAMOTRIGINE 100 MG TAB: 100 mg | ORAL | Qty: 1

## 2015-04-02 NOTE — Progress Notes (Addendum)
Assumed care of patient from [name], [place]. Patient is alert and oriented x 4. No signs of distress or discomfort noted. Bed locked in low position. Call bell within reach.     1130: Patient reported that he is having aura - flashes and metallic taste starting to come. Seizure precautions maintained. Patient laid flat in bed, side rails padded, suction set up.     No seizure episodes witnessed this shift. Bedside shift change report given to [name] Monte, RN (oncoming nurse) by Dahlia Client, RN (offgoing nurse). Report included the following information SBAR, Kardex, Intake/Output and MAR.

## 2015-04-02 NOTE — Progress Notes (Signed)
Patient and/or next of kin has been given the ???Medicare Outpatient Observation Information and Notification??? letter and all questions answered.

## 2015-04-02 NOTE — Progress Notes (Signed)
CLTM in progress per Dr. Regino Schultze.

## 2015-04-02 NOTE — Progress Notes (Signed)
[facility]   Discharge Planning/Social Services Assessment    Reasons for Intervention: Spoke with pt he is visiting here from [place], staying with a friend. He plans to return  There soon. He is disabled from seizure disorder. He was independent with adls and amb prior to  Adm. His pcp in [place] is Dr De Hollingshead, he states he has not  Seen her  Ever. He had [place] mcd but let it lapse. Observation letter given one placed on chart.plan home.    High Risk Criteria   Yes  No   Physician Referral   Yes  No        Date    Nursing Referral   Yes  No        Date    Patient/Family Request   Yes  No        Date       Resources:    Medicare   Yes  No   Medicaid   Yes  No   No Resources   Yes  No   Private Insurance   Yes  No   Case Manager Name/Phone Number    Other   Yes  No        (i.e. Workman's Comp)         Prior Services:    Prior Services   Yes  No   Home Health   Yes  No   Agency    Private Home Care   Yes  No        Number of Hours    Home Care Program   Yes  No   Case Manager    Meals on Wheels   Yes  No   Office on Aging   Yes  No   Transportation Services   Yes  No   Nursing Home   Yes  No        Nursing Home Name    Rehab/VA Hospital   Yes  No        Rehab/VA Name    Other       Information Source:      Information obtained from   Patient   Parent    Guardian   Child   Spouse    Significant Other/Partner    Friend       EMS     Nursing Home Chart           Other:   Chart Review   Yes  No     Family/Support System:    Patient lives with   Alone     Spouse    Significant Other   Children   Caretaker    Parent   Sibling      Other       Other Support System:    Is the patient responsible for care of others   Yes  No   Information of person caring for patient on  discharge    Managers financial affairs independently   Yes  No   If no, explain:      Status Prior to Admission:    Mental Status   Awake   Alert   Oriented   Quiet/Calm  Lethargic/Sedated    Disoriented   Restless/Anxious   Combative    Personal Care   Dependent   Independent Personal Care   Requires Assistance   Meal Preparation Ability   Independent    Standby Assistance  Minimal Assistance    Moderate Assistance   Maximum Assistance      Total Assistance   Chores   Independent with Liberal Resident    Requires Assistance   Bowel/Bladder   Continent   Catheter   Incontinent   Ostomy Self-Care     Urine Diversion Self-Care   Maximum Assistance      Total Assistance   Number of Persons needed for assistance    DME at home   Castella, [place] Falling, Straight    Commode     Bathroom/Grab [facility] Bed   Nebulizer   Oxygen            Raised Toilet Seat   Shower Chair   Side Rails for Bed    Tub Transfer Bench    Walker, Building surveyor, Standard       Other:   Vendor      Treatment Presently Receiving:    Current Treatments   Chemotherapy   Dialysis   Insulin   IVAB  IVF    O2   PCA    PT    RT    Tube Feedings    Wound Care     Psychosocial Evaluation:    Verbalized Knowledge of Disease Process   Patient  Family   Coping with Disease Process   Patient  Family   Requires Further Counseling Coping with Disease Process   Patient  Family     Identified Projected Needs:    Home Health Aid   Yes  No   Transportation   Yes  No   Education   Yes  No        Specific Education     Financial Counseling   Yes  No   Inability to Care for Self/Will Require 24 hour care   Yes  No   Pain Management   Yes  No   Home Infusion Therapy   Yes  No   Oxygen Therapy   Yes  No   DME   Yes  No   Long Term Care Placement   Yes  No   Rehab   Yes  No   Physical Therapy   Yes  No   Needs Anticipated At This Time   Yes  No     Intra-Hospital Referral:    [place]   Yes  No   Life Coach   Yes  No   Patient Representative   Yes  No   Staff for Teaching Needs   Yes  No   Specialty Teaching Needs     Diabetic Educator   Yes  No   Referral for Diabetic Educator Needed   Yes  No  If Yes, place order for Nutritionist or Diabetic Consult      Tentative Discharge Plan:    Home with No Services   Yes  No   Home with Home Health Follow-up   Yes  No        If Yes, specify type    [place]   Yes  No        If Yes, specify type    Meals on Wheels   Yes  No   Office of Aging   Yes  No   NHP   Yes  No   Return to the Nursing Home   Yes  No   Rehab Therapy  Yes  No   Acute Rehab   Yes  No   Subacute Rehab   Yes  No   Private Care   Yes  No   Substance Abuse Referral   Yes  No   Transportation   Yes  No   Chore Service   Yes  No   Inpatient Hospice   Yes  No   OP RT   Yes   No   OP Hemo   Yes   No   OP PT   Yes  No   Support Group   Yes  No   Reach to Recovery   Yes  No   OP Oncology Clinic   Yes  No   Clinic Appointment   Yes  No   DME   Yes  No   Comments    Name of D/C Planner or Social Worker Given to Patient or Family Debra balbo, rn cm   Phone Number [phone]        Extension    Date 04/02/15   Time    If you are discharged home, whom do you designate to participate in your discharge plan and receive any information needed?     Enter name of designee         Phone # of designee         Address of designee         Updated         Patient refused to designate any           individual

## 2015-04-02 NOTE — Other (Signed)
TRANSFER - OUT REPORT:    Verbal report given to [employer]) on Jolayne Panther Marineau  being transferred to 2C(unit) for ordered procedure       Report consisted of patient???s Situation, Background, Assessment and   Recommendations(SBAR).     Information from the following report(s) SBAR, Kardex, Intake/Output, MAR and Cardiac Rhythm Sinus rhythm was reviewed with the receiving nurse.    Lines:   Peripheral IV 04/01/15 Left Antecubital (Active)   Site Assessment Clean, dry, & intact 04/02/2015  7:35 PM   Phlebitis Assessment 0 04/02/2015  7:35 PM   Infiltration Assessment 0 04/02/2015  7:35 PM   Dressing Status Clean, dry, & intact 04/02/2015  7:35 PM   Dressing Type Tape;Transparent 04/02/2015  7:35 PM   Hub Color/Line Status Pink;Infusing 04/02/2015  7:35 PM   Action Taken Open ports on tubing capped 04/02/2015  7:35 PM   Alcohol Cap Used Yes 04/02/2015  7:35 PM        Opportunity for questions and clarification was provided.      Patient transported with:   Registered Nurse

## 2015-04-02 NOTE — Progress Notes (Signed)
Hospitalist Progress Note    Subjective:   Daily Progress Note: 04/02/2015 3:53 PM  H/O Seizures,brought to ER because of recurrent seizures.Pt says that he had 1 seizures before his arrival to the emergency room.He takes dilantin,keppra and lamictal and he thinks that these meds are not working anymore.Says that the last time he saw his neurologist was 02/2016and was doing fine at that time.In the ER,dilantin level was 47.5.    Current Facility-Administered Medications   Medication Dose Route Frequency   ??? hydrochlorothiazide (HYDRODIURIL) tablet 25 mg  25 mg Oral DAILY   ??? levETIRAcetam (KEPPRA) tablet 500 mg  500 mg Oral BID   ??? lisinopril (PRINIVIL, ZESTRIL) tablet 20 mg  20 mg Oral DAILY   ??? lovastatin (MEVACOR) tablet 20 mg  20 mg Oral QHS   ??? lamoTRIgine (LaMICtal) tablet 100 mg  100 mg Oral BID   ??? acetaminophen (TYLENOL) tablet 650 mg  650 mg Oral Q4H PRN   ??? 0.9% sodium chloride infusion  150 mL/hr IntraVENous CONTINUOUS        Review of Systems  Feeling metallic taste in the mouth but seizure activity reported since transferred from ER to medical floor.Also reports feeling somewhat dizzy.    Objective:     Visit Vitals   ??? BP 109/68 (BP 1 Location: Right arm, BP Patient Position: At rest)   ??? Pulse 88   ??? Temp 98 ??F (36.7 ??C)   ??? Resp 15   ??? Ht 5\' 10"  (1.778 m)   ??? Wt 108.9 kg (240 lb)   ??? SpO2 100%   ??? BMI 34.44 kg/m2      O2 Device: Room air    Temp (24hrs), Avg:98.2 ??F (36.8 ??C), Min:98 ??F (36.7 ??C), Max:98.8 ??F (37.1 ??C)         09/04 1901 - 09/06 0700  In: 2082.5 [P.O.:960; I.V.:1122.5]  Out: 800 [Urine:800]  P/E  NAD,lying comfortably in bed.  Heent:no nystagmus,perrla,mouth moist.  Lungs cta,no wheezing.  Heart:s1s2 nl,no m/r/g  Abdm:soft,not tender,bs present.  Extr:no edema,good pulses.  Neuro:aaox3.No focal deficit.  Data Review    Recent Results (from the past 12 hour(s))   DRUG SCREEN, URINE    Collection Time: 04/02/15  7:35 AM   Result Value Ref Range    BENZODIAZEPINE NEGATIVE  NEG       BARBITURATES NEGATIVE  NEG      THC (TH-CANNABINOL) POSITIVE (A) NEG      OPIATES NEGATIVE  NEG      PCP(PHENCYCLIDINE) NEGATIVE  NEG      COCAINE NEGATIVE  NEG      AMPHETAMINE NEGATIVE  NEG      METHADONE NEGATIVE       HDSCOM (NOTE)    URINALYSIS W/ RFLX MICROSCOPIC    Collection Time: 04/02/15  7:35 AM   Result Value Ref Range    Color DARK YELLOW      Appearance CLEAR      Specific gravity >1.030 (H) 1.005 - 1.030    pH (UA) 6.0 5.0 - 8.0      Protein NEGATIVE  NEG mg/dL    Glucose NEGATIVE  NEG mg/dL    Ketone TRACE (A) NEG mg/dL    Bilirubin NEGATIVE  NEG      Blood NEGATIVE  NEG      Urobilinogen 1.0 0.2 - 1.0 EU/dL    Nitrites NEGATIVE  NEG      Leukocyte Esterase NEGATIVE  NEG  Assessment/Plan:   Recurrent seizures.  Elevated dilantin level.  HTN    Care Plan  Recurrent seizures  Elevated dilantin level  -Continue lamictal and keppra.  -Hold dilantin  -Repeat dilantin level  -Neurology consult called  -Tele monitoring.     HTN  -On lisinopril    -DVT prophylaxis.

## 2015-04-02 NOTE — Progress Notes (Signed)
Paged Kristi concerning patient's request for percocet q4 and xanax 2x/day.

## 2015-04-02 NOTE — Progress Notes (Signed)
Patient has designated ___________none_____________ to participate in his/her discharge plan and to receive any needed information.     Name:   Address:  Phone number:

## 2015-04-02 NOTE — Consults (Signed)
He is seen and neurology consult was dictated.    He will be transferred to EEG monitoring unit tonight.  60 minutes were spent with his care and more than half was correlating to his care.

## 2015-04-02 NOTE — Other (Signed)
Report received from [facility] . Assumed care of pt at 1940.  Patient is talking over the phone, alert & oriented X 4. denies distress but just dizzy & a little nauseated. No seizures activity noted. Call bell within reach.

## 2015-04-02 NOTE — Progress Notes (Addendum)
Patient being transferred from 2100 for EEG monitoring, received pt resting in bed, alert and oriented x 4. With EEG tech at bedside. Pt has no s/s of distress. Call bell within reach.    0418> some of EEG leads came off, pt is sweating. Sleep lab tech paged.    0503> Sleep lab tech paged again, was connected to Sleep lab, notified that some of pt's EEG leads came off from sweating,  was told that the sleep lab tech is not there and was given a number to call [phone]. Attempted to call 3 x but no answer.

## 2015-04-02 NOTE — Progress Notes (Signed)
Problem: Discharge Planning  Goal: *Discharge to safe environment  Outcome: Resolved/Met Date Met:  04/02/15  Plan home.

## 2015-04-02 NOTE — Progress Notes (Signed)
Chaplain conducted an initial consultation and Spiritual Assessment for [name], who is a 29 y.o.,male. Patient???s Primary Language is: [place].   According to the patient???s EMR Religious Affiliation is: [place].     The reason the Patient came to the hospital is:   Patient Active Problem List    Diagnosis Date Noted   ??? Recurrent seizures (HCC) 02/21/2013   ??? Bipolar disorder with severe depression (HCC) 02/14/2013   ??? History of suicidal ideation 02/11/2013   ??? Psychosis 02/11/2013   ??? Localization-related (focal) (partial) epilepsy and epileptic syndromes with complex partial seizures, with intractable epilepsy 01/25/2012   ??? Non-compliance with treatment 10/01/2010   ??? Malingering 10/01/2010   ??? Polysubstance dependence (HCC) 10/01/2010   ??? Polysubstance overdose 08/22/2010     Class: Chronic   ??? Adjustment disorder with depressed mood 07/29/2010   ??? Borderline personality disorder 07/29/2010        The Chaplain provided the following Interventions:  Initiated a relationship of care and support with patient in room 2111  Shared with him information about the department and of our presence here.  Listened as he shared information about his past and of his current condition.  Feels very much overwhelmed and down trodden saying that he can't seem to get ahead.  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    The following outcomes were achieved:  Patient shared limited information about his medical narrative and spiritual journey/beliefs.  Patient processed feeling about current hospitalization.  Patient expressed gratitude for pastoral care visit.    Assessment:  Patient does not have any religious/cultural needs that will affect patient???s preferences in health care.  There are no further spiritual or religious issues which require Spiritual Care Services interventions at this time.     Plan:   Chaplains will continue to follow and will provide pastoral care on an as needed/requested basis    .Dewaine Oats   Spiritual Care   [phone]

## 2015-04-02 NOTE — Progress Notes (Signed)
Routine EEG was completed 04/02/2015.

## 2015-04-02 NOTE — Other (Signed)
Bedside and Verbal shift change report given to Terri C. & Hannah W. RN (oncoming nurse) by Rodora Sosa, RN (offgoing nurse).  Report given with SBAR, Kardex, Intake/Output, MAR and Recent Results.

## 2015-04-03 LAB — METABOLIC PANEL, BASIC
Anion gap: 5 mmol/L (ref 3.0–18)
BUN/Creatinine ratio: 14 (ref 12–20)
BUN: 11 MG/DL (ref 7.0–18)
CO2: 29 mmol/L (ref 21–32)
Calcium: 8.4 MG/DL — ABNORMAL LOW (ref 8.5–10.1)
Chloride: 111 mmol/L — ABNORMAL HIGH (ref 100–108)
Creatinine: 0.77 MG/DL (ref 0.6–1.3)
GFR est AA: >60 mL/min/{1.73_m2} (ref >60)
GFR est non-AA: >60 mL/min/{1.73_m2} (ref >60)
Glucose: 96 mg/dL (ref 74–99)
Potassium: 3.6 mmol/L (ref 3.5–5.5)
Sodium: 145 mmol/L (ref 136–145)

## 2015-04-03 LAB — PHENYTOIN: Phenytoin: 34.8 ug/mL — ABNORMAL HIGH (ref 10.0–20.0)

## 2015-04-03 MED ORDER — ONDANSETRON (PF) 4 MG/2 ML INJECTION
4 mg/2 mL | Freq: Four times a day (QID) | INTRAMUSCULAR | Status: DC | PRN
Start: 2015-04-03 — End: 2015-04-06
  Administered 2015-04-03: 02:00:00 via INTRAVENOUS

## 2015-04-03 MED FILL — LEVETIRACETAM 500 MG TAB: 500 mg | ORAL | Qty: 1

## 2015-04-03 MED FILL — HYDROCHLOROTHIAZIDE 25 MG TAB: 25 mg | ORAL | Qty: 1

## 2015-04-03 MED FILL — ONDANSETRON (PF) 4 MG/2 ML INJECTION: 4 mg/2 mL | INTRAMUSCULAR | Qty: 2

## 2015-04-03 MED FILL — LISINOPRIL 20 MG TAB: 20 mg | ORAL | Qty: 1

## 2015-04-03 MED FILL — LOVASTATIN 20 MG TAB: 20 mg | ORAL | Qty: 1

## 2015-04-03 MED FILL — LAMOTRIGINE 100 MG TAB: 100 mg | ORAL | Qty: 1
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
## 2015-04-03 NOTE — Progress Notes (Signed)
Hospitalist Progress Note    Subjective:   Daily Progress Note: 04/03/2015 3:53 PM  H/O Seizures,brought to ER because of recurrent seizures.Pt says that he had 1 seizures before his arrival to the emergency room.He takes dilantin,keppra and lamictal and he thinks that these meds are not working anymore.Says that the last time he saw his neurologist was 02/2016and was doing fine at that time.In the ER,dilantin level was 47.5.    Current Facility-Administered Medications   Medication Dose Route Frequency   ??? hydrochlorothiazide (HYDRODIURIL) tablet 25 mg  25 mg Oral DAILY   ??? levETIRAcetam (KEPPRA) tablet 500 mg  500 mg Oral BID   ??? lisinopril (PRINIVIL, ZESTRIL) tablet 20 mg  20 mg Oral DAILY   ??? lovastatin (MEVACOR) tablet 20 mg  20 mg Oral QHS   ??? lamoTRIgine (LaMICtal) tablet 100 mg  100 mg Oral BID   ??? acetaminophen (TYLENOL) tablet 650 mg  650 mg Oral Q4H PRN   ??? ondansetron (ZOFRAN) injection 4 mg  4 mg IntraVENous Q6H PRN   ??? 0.9% sodium chloride infusion  150 mL/hr IntraVENous CONTINUOUS        Review of Systems  Feeling metallic taste in the mouth but seizure activity reported since transferred from ER to medical floor.Also reports feeling somewhat dizzy.    Objective:     Visit Vitals   ??? BP 106/68 (BP 1 Location: Left arm, BP Patient Position: At rest)   ??? Pulse 66   ??? Temp 98.4 ??F (36.9 ??C)   ??? Resp 16   ??? Ht  (1.778 m)   ??? Wt 108.9 kg (240 lb)   ??? SpO2 96%   ??? BMI 34.44 kg/m2      O2 Device: Room air    Temp (24hrs), Avg:98 ??F (36.7 ??C), Min:97.2 ??F (36.2 ??C), Max:98.4 ??F (36.9 ??C)      09/07 0701 - 09/07 1900  In: 240 [P.O.:240]  Out: -   09/05 1901 - 09/07 0700  In: 3882.5 [P.O.:960; I.V.:2922.5]  Out: 800 [Urine:800]  P/E  NAD,lying comfortably in bed.  Heent:no nystagmus,perrla,mouth moist.  Lungs cta,no wheezing.  Heart:s1s2 nl,no m/r/g  Abdm:soft,not tender,bs present.  Extr:no edema,good pulses.  Neuro:aaox3.No focal deficit.  Data Review     No results found for this or any previous visit (from the past 12 hour(s)).      Assessment/Plan:   Recurrent seizures.  Elevated dilantin level  Dilantin toxicity.  HTN    Care Plan  Recurrent seizures  Elevated dilantin level/Dilantin toxicity  -Continue lamictal and keppra.  -Hold dilantin.Dilantin level 34.8 today  -Repeat dilantin level in am  -Neurology following  -EEG and video monitoring for classification of seizures.  -Tele monitoring.     HTN  -On lisinopril    -DVT prophylaxis.

## 2015-04-03 NOTE — Other (Signed)
Bedside and Verbal shift change report given to Juliette Alcide (oncoming nurse) by Lelon Mast   (offgoing nurse). Report included the following information SBAR, Kardex, Intake/Output and MAR.
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
## 2015-04-03 NOTE — Progress Notes (Signed)
Assumed Care. Bedside and verbal received from [facility], off going nurse. Sbar, Kardex, and Mar reviewed. No distress noted. Will continue to monitor. Pt sleeping no distress noted

## 2015-04-03 NOTE — Progress Notes (Addendum)
Bedside and Verbal shift change report given to Jaymes Graff, Charity fundraiser (Cabin crew) by Philbert Riser, RN (offgoing nurse). Report included the following information SBAR, Kardex, Procedure Summary, Intake/Output, MAR, Recent Results and Cardiac Rhythm NSR.Marland KitchenMarland Kitchen#43. Pt is awake, alert, and oriented x 4 lying in bed laughing and talking to staff members without difficulty. NAD. Denies pain. Call bell within reach. Will continue to monitor.    5409 Call into pt room to check on IV in L AC. Noted catheter sticking out. D/C fluids. Removed IV without difficulty. Placed 2x2 gauze/tape to area. Pt tolerated well. Awaiting new IV insertion by nurse staff member.    0900 Pt IV came out per patient. Reinsertion attempts failed x 2 by staff on unit. IV team member requested for assistance. Pt informed of status but is anxious and states, "I need an IV to get all this dilantin out my system." Call bell within reach. Sitter at bedside. NAD. Will continue to monitor.    0940 Assessment complete. Pt awaiting IV reinsertion anxiously. Explained to pt the status of requesting a IV team member to insert his IV due to difficulty at this time. Call bell within reach. Denies pain. Monitoring. Sitter remain at bedside.    1055 Pt still awaiting, anxiously for IV restart. Explained nursing supervisor contacted by charge nurse. NAD. Denies pain. Call bell within reach. Sitter at bedside. Monitoring.    1144 Per patient, pt just informed by nursing manager, Liborio Nixon, RN, that [place] from ICU will be in pt room shortly to restart IV. Pt appears calm and satisfied with recent status. NAD. Denies pain. Call bell within reach. Sitter at bedside. Will continue to monitor.      1259 Pt awake and alert sitting in bed eating take-out chinese food. Re-educated on Cardiac ordered diet. Pt states, " I know". Denies any pain. Call bell within reach. Monitoring.    1345 Pt resting quietly in bed with eyes closed. Resp even and unlabored.  NAD. Call bell within reach. Sitter at bedside. Monitoring.    1500 Pt lying in bed resting quietly in bed. NAD. Call bell within reach. Sitter at bedside. Monitoring.        1756 New bag NaCl hung as ordered. Infusing without difficulty. Pt in bed. NAD. Sitter at bedside. Call bell within reach.     1828 Scheduled meds given as ordered. Pt tolerated well. See Mar. Call bell within reach. Sitter at bedside. Monitoring.    1920 Bedside and Verbal shift change report given to Annabelle Harman, Charity fundraiser (oncoming nurse) by Jaymes Graff, RN (offgoing nurse). Report included the following information SBAR, Kardex, Intake/Output, MAR, Recent Results and Cardiac Rhythm NSR.

## 2015-04-03 NOTE — Progress Notes (Signed)
Neurology Progress Note    Patient ID:  [name]  [id number]  29 y.o.  [birth year]    Subjective:      Patient has no seizures last night. Dizziness is getting better.  Dilantin level was 35 today.    Current Facility-Administered Medications   Medication Dose Route Frequency   ??? hydrochlorothiazide (HYDRODIURIL) tablet 25 mg  25 mg Oral DAILY   ??? levETIRAcetam (KEPPRA) tablet 500 mg  500 mg Oral BID   ??? lisinopril (PRINIVIL, ZESTRIL) tablet 20 mg  20 mg Oral DAILY   ??? lovastatin (MEVACOR) tablet 20 mg  20 mg Oral QHS   ??? lamoTRIgine (LaMICtal) tablet 100 mg  100 mg Oral BID   ??? acetaminophen (TYLENOL) tablet 650 mg  650 mg Oral Q4H PRN   ??? ondansetron (ZOFRAN) injection 4 mg  4 mg IntraVENous Q6H PRN   ??? 0.9% sodium chloride infusion  150 mL/hr IntraVENous CONTINUOUS            Objective:     Patient Vitals for the past 8 hrs:   BP Temp Pulse Resp SpO2   04/03/15 1111 106/68 98.4 ??F (36.9 ??C) 66 16 96 %           Lab Review   Recent Results (from the past 24 hour(s))   PHENYTOIN    Collection Time: 04/03/15  4:10 AM   Result Value Ref Range    Phenytoin 34.8 (H) 10.0 - 20.0 ug/mL   METABOLIC PANEL, BASIC    Collection Time: 04/03/15  4:10 AM   Result Value Ref Range    Sodium 145 136 - 145 mmol/L    Potassium 3.6 3.5 - 5.5 mmol/L    Chloride 111 (H) 100 - 108 mmol/L    CO2 29 21 - 32 mmol/L    Anion gap 5 3.0 - 18 mmol/L    Glucose 96 74 - 99 mg/dL    BUN 11 7.0 - 18 MG/DL    Creatinine 4.09 0.6 - 1.3 MG/DL    BUN/Creatinine ratio 14 12 - 20      GFR est AA >60 >60 ml/min/1.34m2    GFR est non-AA >60 >60 ml/min/1.68m2    Calcium 8.4 (L) 8.5 - 10.1 MG/DL     Imaging:   MRI Results (most recent):  No results found for this or any previous visit.  CT Results (most recent):    Results from Hospital Encounter encounter on 04/01/15   CT HEAD WO CONT   Narrative EXAM: CT head    INDICATION: Seizure.    COMPARISON: February 07, 2013.     TECHNIQUE: Axial CT imaging of the head was performed without intravenous  contrast. Images were reconstructed in the coronal and sagittal planes.    One or more dose reduction techniques were used on this CT: automated exposure  control, adjustment of the mAs and/or kVp according to patient's size, and  iterative reconstruction techniques. The specific techniques utilized on this CT  exam have been documented in the patient's electronic medical record.    Note: Preliminary report sent to the Emergency Department by the radiology  resident at the time of the study.    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 There is no intracranial  hemorrhage, mass effect, or midline shift. There are no acute areas of abnormal  parenchymal attenuation.  EXTRA-AXIAL SPACES AND MENINGES: There are no abnormal extra-axial fluid  collections.    CALVARIUM: Again identified are postsurgical changes to the frontal calvarium.  No acute abnormality identified.    SINUSES: Mild mucosal thickening in the ethmoid air cells.    OTHER: None.    _______________         Impression IMPRESSION:    1.  No acute intracranial abnormalities.  2. 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 Stable postsurgical changes in the frontal calvarium.          Additional comments: reviewed above        NEUROLOGICAL EXAM:    Appearance:  The patient is well developed, provides a coherent history and is in no acute distress.   Mental Status: Oriented to time, place and person.    Cranial Nerves:    Facial movement is symmetric. Hearing is normal bilaterally.   No focal weakness of his limbs and LT is intact bilaterally. Gait is deferred.               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  Will keep EEG and video monitoring to classify his  recent frequent seizure activities. Will hold his Dilatin given his toxic Dilantinlevel and continue his additional 2 antiepileptics in his regimen.   Dilantin toxicity: daily level and cardiac monitoring.     Signed:  Judithe Modest, MD  04/03/2015  4:26 PM

## 2015-04-03 NOTE — Progress Notes (Signed)
Access patient chart to help the primary nurse.

## 2015-04-03 NOTE — Procedures (Signed)
[facility]                  [address] [place], [place]  [address]                              NEUROSCIENCE CENTER                             ELECTROENCEPHALOGRAM    PATIENT:     [name], [name]  CSN:            [id number]       DATE:       04/02/2015  MRN:            [id number]        LOCATION:  DICTATING:   Judithe Modest, MD      EEG [phone].    This is an inpatient EEG.    PATIENT HISTORY: This is a 29 year old Caucasian man who has intractable  seizures secondary to his significant head trauma in 2000. He underwent the  frontal lobectomy after the injury. He has never been seizure-free, even  with 3 medications for his seizures including Dilantin, Lamictal and  Keppra. He was admitted because he had 10 seizures over 48 hours prior to  admission. This EEG is to evaluate his seizure activity.    EEG DATA INTERPRETATION: This is a referential and bipolar EEG recorded with a  10/20 system. Patient is awake and follows commands during the recording.  The posterior background rhythm is 9 Hz, alpha rhythm which is symmetric  and reactive to eye opening and eye closure. The intermittent delta  activities are noted in the right frontal and anterior temporal region. No  epileptiform discharges or electrographic seizures are noted. The  hyperventilation does not elicit abnormal responses. Photic stimulation  does not reveal driving responses.    The stage 2 sleep are achieved intermittently which consists of spindle and  K complex.    EEG DIAGNOSIS: This is an abnormal EEG. The study indicates focal cerebral  dysfunction in the right frontal and anterior temporal region. No seizure  Activity recorded in the study.                      Judithe Modest, MD    LW:wmx  D: 04/03/2015 T: 04/03/2015 02:17 P  CQDocID #:  [id number]  CScriptDoc #:  [id number]  cc:   Darnelle Catalan, MD        Judithe Modest, MD

## 2015-04-04 LAB — CBC WITH AUTOMATED DIFF
ABS. BASOPHILS: 0 10*3/uL (ref 0.0–0.06)
ABS. EOSINOPHILS: 0.2 10*3/uL (ref 0.0–0.4)
ABS. LYMPHOCYTES: 2.2 10*3/uL (ref 0.9–3.6)
ABS. MONOCYTES: 0.4 10*3/uL (ref 0.05–1.2)
ABS. NEUTROPHILS: 2.1 10*3/uL (ref 1.8–8.0)
BASOPHILS: 0 % (ref 0–2)
EOSINOPHILS: 3 % (ref 0–5)
HCT: 38.7 % (ref 36.0–48.0)
HGB: 12.9 g/dL — ABNORMAL LOW (ref 13.0–16.0)
LYMPHOCYTES: 46 % (ref 21–52)
MCH: 30.8 PG (ref 24.0–34.0)
MCHC: 33.3 g/dL (ref 31.0–37.0)
MCV: 92.4 FL (ref 74.0–97.0)
MONOCYTES: 7 % (ref 3–10)
MPV: 11.6 FL (ref 9.2–11.8)
NEUTROPHILS: 44 % (ref 40–73)
PLATELET: 201 10*3/uL (ref 135–420)
RBC: 4.19 M/uL — ABNORMAL LOW (ref 4.70–5.50)
RDW: 12.7 % (ref 11.6–14.5)
WBC: 4.8 10*3/uL (ref 4.6–13.2)

## 2015-04-04 LAB — METABOLIC PANEL, BASIC
Anion gap: 7 mmol/L (ref 3.0–18)
BUN/Creatinine ratio: 17 (ref 12–20)
BUN: 10 MG/DL (ref 7.0–18)
CO2: 28 mmol/L (ref 21–32)
Calcium: 8.2 MG/DL — ABNORMAL LOW (ref 8.5–10.1)
Chloride: 111 mmol/L — ABNORMAL HIGH (ref 100–108)
Creatinine: 0.58 MG/DL — ABNORMAL LOW (ref 0.6–1.3)
GFR est AA: >60 mL/min/{1.73_m2} (ref >60)
GFR est non-AA: >60 mL/min/{1.73_m2} (ref >60)
Glucose: 95 mg/dL (ref 74–99)
Potassium: 3.5 mmol/L (ref 3.5–5.5)
Sodium: 146 mmol/L — ABNORMAL HIGH (ref 136–145)

## 2015-04-04 LAB — PHENYTOIN: Phenytoin: 33.9 ug/mL — ABNORMAL HIGH (ref 10.0–20.0)

## 2015-04-04 LAB — LEVETIRACETAM (KEPPRA): Levetiracetam (Keppra): NOT DETECTED ug/mL (ref 10.0–40.0)

## 2015-04-04 MED FILL — LAMOTRIGINE 100 MG TAB: 100 mg | ORAL | Qty: 1

## 2015-04-04 MED FILL — HYDROCHLOROTHIAZIDE 25 MG TAB: 25 mg | ORAL | Qty: 1

## 2015-04-04 MED FILL — LISINOPRIL 20 MG TAB: 20 mg | ORAL | Qty: 1

## 2015-04-04 MED FILL — LOVASTATIN 20 MG TAB: 20 mg | ORAL | Qty: 1

## 2015-04-04 MED FILL — LEVETIRACETAM 500 MG TAB: 500 mg | ORAL | Qty: 1

## 2015-04-04 NOTE — Progress Notes (Signed)
2230. Medication Administration. No distress noted. Pt resting in bed on phone,  talking to sitter  0000. Vitals checked. Sitter at the bedside. EEG leads on. Pt with no requests at this time.  0345. Vitals checked. Pt sleeping in bed  0630. Iv fluids changed. Sitter at bedside. Pt sleeping in bed    0737. Bedside and Verbal shift change report given to Allegra RN (oncoming nurse) by Annabelle Harman RN (offgoing nurse). Report included the following information SBAR, Kardex and MAR.

## 2015-04-04 NOTE — Progress Notes (Signed)
Hospitalist Progress Note    Subjective:   Daily Progress Note: 04/04/2015 3:53 PM  H/O Seizures,brought to ER because of recurrent seizures.Pt says that he had 1 seizures before his arrival to the emergency room.He takes dilantin,keppra and lamictal and he thinks that these meds are not working anymore.Says that the last time he saw his neurologist was 02/2016and was doing fine at that time.In the ER,dilantin level was 47.5.    Current Facility-Administered Medications   Medication Dose Route Frequency   ??? hydrochlorothiazide (HYDRODIURIL) tablet 25 mg  25 mg Oral DAILY   ??? levETIRAcetam (KEPPRA) tablet 500 mg  500 mg Oral BID   ??? lisinopril (PRINIVIL, ZESTRIL) tablet 20 mg  20 mg Oral DAILY   ??? lovastatin (MEVACOR) tablet 20 mg  20 mg Oral QHS   ??? lamoTRIgine (LaMICtal) tablet 100 mg  100 mg Oral BID   ??? acetaminophen (TYLENOL) tablet 650 mg  650 mg Oral Q4H PRN   ??? ondansetron (ZOFRAN) injection 4 mg  4 mg IntraVENous Q6H PRN   ??? 0.9% sodium chloride infusion  150 mL/hr IntraVENous CONTINUOUS        Review of Systems  No seizure since admission.The sitter present in the room does not report any seizure.    Objective:     Visit Vitals   ??? BP 136/85 (BP 1 Location: Left arm, BP Patient Position: At rest)   ??? Pulse 78   ??? Temp 97.9 ??F (36.6 ??C)   ??? Resp 16   ??? Ht  (1.778 m)   ??? Wt 108.9 kg (240 lb)   ??? SpO2 97%   ??? BMI 34.44 kg/m2      O2 Device: Room air    Temp (24hrs), Avg:98 ??F (36.7 ??C), Min:97.8 ??F (36.6 ??C), Max:98.4 ??F (36.9 ??C)         09/06 1901 - 09/08 0700  In: 2400 [P.O.:600; I.V.:1800]  Out: -   P/E  NAD,lying comfortably in bed.  Heent:no nystagmus,perrla,mouth moist.  Lungs cta,no wheezing.  Heart:s1s2 nl,no m/r/g  Abdm:soft,not tender,bs present.  Extr:no edema,good pulses.  Neuro:aaox3.No focal deficit.  Data Review    No results found for this or any previous visit (from the past 12 hour(s)).      Assessment/Plan:   Recurrent seizures.  Elevated dilantin level  Dilantin toxicity.  HTN     Care Plan  Lab works and consult reviewed.    Recurrent seizures  Elevated dilantin level/Dilantin toxicity  -No seizure activity since admission  -Continue lamictal and keppra.  -Hold dilantin.Dilantin level 33.9 today  -Repeat dilantin level in am  -Neurology following,will follow the recommendation.  -EEG and video monitoring for classification of seizures.Pt with a sitter in the room.  -Tele monitoring.     HTN  -Controlled,on lisinopril    -DVT prophylaxis.

## 2015-04-04 NOTE — Progress Notes (Addendum)
Received resting in bed on EEG monitoring with sitter at bedside.  No acute distress noted, IV intact/patent/fluids infusing.  Seizure precautions maintained, safety measures in place, call bell in reach.    @ 1330 No acute distress noted, sitter remains at bedside.    @ 1610 asleep comfortably in bed.  EEG monitor remains intact with sitter at bedside.  Safety measures in place, call bell in reach.

## 2015-04-04 NOTE — Progress Notes (Signed)
Neurology Progress Note    Patient ID:  [name]  [id number]  29 y.o.  [birth year]    Subjective:      Patient has no seizures today and last night. Some dizziness .  Dilantin level was 34 today.    Current Facility-Administered Medications   Medication Dose Route Frequency   ??? hydrochlorothiazide (HYDRODIURIL) tablet 25 mg  25 mg Oral DAILY   ??? levETIRAcetam (KEPPRA) tablet 500 mg  500 mg Oral BID   ??? lisinopril (PRINIVIL, ZESTRIL) tablet 20 mg  20 mg Oral DAILY   ??? lovastatin (MEVACOR) tablet 20 mg  20 mg Oral QHS   ??? lamoTRIgine (LaMICtal) tablet 100 mg  100 mg Oral BID   ??? acetaminophen (TYLENOL) tablet 650 mg  650 mg Oral Q4H PRN   ??? ondansetron (ZOFRAN) injection 4 mg  4 mg IntraVENous Q6H PRN   ??? 0.9% sodium chloride infusion  150 mL/hr IntraVENous CONTINUOUS            Objective:     Patient Vitals for the past 8 hrs:   BP Temp Pulse Resp SpO2   04/04/15 2016 133/78 98 ??F (36.7 ??C) 66 16 97 %           Lab Review   Recent Results (from the past 24 hour(s))   CBC WITH AUTOMATED DIFF    Collection Time: 04/04/15  3:45 AM   Result Value Ref Range    WBC 4.8 4.6 - 13.2 K/uL    RBC 4.19 (L) 4.70 - 5.50 M/uL    HGB 12.9 (L) 13.0 - 16.0 g/dL    HCT 40.9 81.1 - 91.4 %    MCV 92.4 74.0 - 97.0 FL    MCH 30.8 24.0 - 34.0 PG    MCHC 33.3 31.0 - 37.0 g/dL    RDW 78.2 95.6 - 21.3 %    PLATELET 201 135 - 420 K/uL    MPV 11.6 9.2 - 11.8 FL    NEUTROPHILS 44 40 - 73 %    LYMPHOCYTES 46 21 - 52 %    MONOCYTES 7 3 - 10 %    EOSINOPHILS 3 0 - 5 %    BASOPHILS 0 0 - 2 %    ABS. NEUTROPHILS 2.1 1.8 - 8.0 K/UL    ABS. LYMPHOCYTES 2.2 0.9 - 3.6 K/UL    ABS. MONOCYTES 0.4 0.05 - 1.2 K/UL    ABS. EOSINOPHILS 0.2 0.0 - 0.4 K/UL    ABS. BASOPHILS 0.0 0.0 - 0.06 K/UL    DF AUTOMATED     METABOLIC PANEL, BASIC    Collection Time: 04/04/15  3:45 AM   Result Value Ref Range    Sodium 146 (H) 136 - 145 mmol/L    Potassium 3.5 3.5 - 5.5 mmol/L    Chloride 111 (H) 100 - 108 mmol/L    CO2 28 21 - 32 mmol/L     Anion gap 7 3.0 - 18 mmol/L    Glucose 95 74 - 99 mg/dL    BUN 10 7.0 - 18 MG/DL    Creatinine 0.86 (L) 0.6 - 1.3 MG/DL    BUN/Creatinine ratio 17 12 - 20      GFR est AA >60 >60 ml/min/1.1m2    GFR est non-AA >60 >60 ml/min/1.93m2    Calcium 8.2 (L) 8.5 - 10.1 MG/DL   PHENYTOIN    Collection Time: 04/04/15  3:45 AM   Result Value Ref Range    Phenytoin  33.9 (H) 10.0 - 20.0 ug/mL     Imaging:   MRI Results (most recent):  No results found for this or any previous visit.  CT Results (most recent):    Results from Hospital Encounter encounter on 04/01/15   CT HEAD WO CONT   Narrative EXAM: CT head    INDICATION: Seizure.    COMPARISON: February 07, 2013.    TECHNIQUE: Axial CT imaging of the head was performed without intravenous  contrast. Images were reconstructed in the coronal and sagittal planes.    One or more dose reduction techniques were used on this CT: automated exposure  control, adjustment of the mAs and/or kVp according to patient's size, and  iterative reconstruction techniques. The specific techniques utilized on this CT  exam have been documented in the patient's electronic medical record.    Note: Preliminary report sent to the Emergency Department by the radiology  resident at the time of the study.    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 There is no intracranial  hemorrhage, mass effect, or midline shift. There are no acute areas of abnormal  parenchymal attenuation.    EXTRA-AXIAL SPACES AND MENINGES: There are no abnormal extra-axial fluid  collections.    CALVARIUM: Again identified are postsurgical changes to the frontal calvarium.  No acute abnormality identified.    SINUSES: Mild mucosal thickening in the ethmoid air cells.    OTHER: None.    _______________         Impression IMPRESSION:    1.  No acute intracranial abnormalities.   2. 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 Stable postsurgical changes in the frontal calvarium.          Additional comments: reviewed above        NEUROLOGICAL EXAM:    Appearance:  The patient is well developed, provides a coherent history and is in no acute distress.   Mental Status: Oriented to time, place and person.    Cranial Nerves:    Facial movement is symmetric. Hearing is normal bilaterally.   No focal weakness of his limbs and LT is intact bilaterally. Gait is deferred.               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 Has no episodes yet.  Will keep EEG and video monitoring to classify his recent frequent seizure activities.  Dilantin was held and continue his Keppra and Lamictal.   Dilantin toxicity: daily level and cardiac monitoring.     Signed:  Judithe Modest, MD  04/04/2015  4:26 PM

## 2015-04-04 NOTE — Progress Notes (Signed)
Administered morning medications for primary nurse. HIPAA maintained.

## 2015-04-04 NOTE — Progress Notes (Signed)
Problem: Falls - Risk of  Goal: *Absence of falls  Outcome: Progressing Towards Goal  Pt will be free of falls this hospital stay. Pt instructed to call before getting out of bed. Call bell within reach. Pt verbalized understanding. Sitter at the bedside

## 2015-04-05 LAB — METABOLIC PANEL, BASIC
Anion gap: 8 mmol/L (ref 3.0–18)
BUN/Creatinine ratio: 23 — ABNORMAL HIGH (ref 12–20)
BUN: 14 MG/DL (ref 7.0–18)
CO2: 27 mmol/L (ref 21–32)
Calcium: 8.8 MG/DL (ref 8.5–10.1)
Chloride: 110 mmol/L — ABNORMAL HIGH (ref 100–108)
Creatinine: 0.6 MG/DL (ref 0.6–1.3)
GFR est AA: >60 mL/min/{1.73_m2} (ref >60)
GFR est non-AA: >60 mL/min/{1.73_m2} (ref >60)
Glucose: 77 mg/dL (ref 74–99)
Potassium: 3.6 mmol/L (ref 3.5–5.5)
Sodium: 145 mmol/L (ref 136–145)

## 2015-04-05 LAB — PHENYTOIN: Phenytoin: 28.6 ug/mL — ABNORMAL HIGH (ref 10.0–20.0)

## 2015-04-05 MED FILL — LISINOPRIL 20 MG TAB: 20 mg | ORAL | Qty: 1

## 2015-04-05 MED FILL — HYDROCHLOROTHIAZIDE 25 MG TAB: 25 mg | ORAL | Qty: 1

## 2015-04-05 MED FILL — LAMOTRIGINE 100 MG TAB: 100 mg | ORAL | Qty: 1

## 2015-04-05 MED FILL — LOVASTATIN 20 MG TAB: 20 mg | ORAL | Qty: 1

## 2015-04-05 MED FILL — LEVETIRACETAM 500 MG TAB: 500 mg | ORAL | Qty: 1

## 2015-04-05 NOTE — Progress Notes (Signed)
Hospitalist Progress Note    Subjective:   Daily Progress Note: 04/05/2015 3:53 PM  H/O Seizures,brought to ER because of recurrent seizures.Pt says that he had 1 seizures before his arrival to the emergency room.He takes dilantin,keppra and lamictal and he thinks that these meds are not working anymore.Says that the last time he saw his neurologist was 02/2016and was doing fine at that time.In the ER,dilantin level was 47.5.No seizure since admitted to the medical floor.    Current Facility-Administered Medications   Medication Dose Route Frequency   ??? hydrochlorothiazide (HYDRODIURIL) tablet 25 mg  25 mg Oral DAILY   ??? levETIRAcetam (KEPPRA) tablet 500 mg  500 mg Oral BID   ??? lisinopril (PRINIVIL, ZESTRIL) tablet 20 mg  20 mg Oral DAILY   ??? lovastatin (MEVACOR) tablet 20 mg  20 mg Oral QHS   ??? lamoTRIgine (LaMICtal) tablet 100 mg  100 mg Oral BID   ??? acetaminophen (TYLENOL) tablet 650 mg  650 mg Oral Q4H PRN   ??? ondansetron (ZOFRAN) injection 4 mg  4 mg IntraVENous Q6H PRN   ??? 0.9% sodium chloride infusion  150 mL/hr IntraVENous CONTINUOUS        Review of Systems  No seizure since admission.The sitter present in the room does not report any seizure.Feeling fine today.    Objective:     Visit Vitals   ??? BP 139/90 (BP 1 Location: Left arm, BP Patient Position: Sitting)   ??? Pulse 66   ??? Temp 97.3 ??F (36.3 ??C)   ??? Resp 18   ??? Ht 5\' 10"  (1.778 m)   ??? Wt 108.9 kg (240 lb)   ??? SpO2 98%   ??? BMI 34.44 kg/m2      O2 Device: Room air    Temp (24hrs), Avg:97.7 ??F (36.5 ??C), Min:97.3 ??F (36.3 ??C), Max:98 ??F (36.7 ??C)      09/09 0701 - 09/09 1900  In: 930 [P.O.:930]  Out: 1300 [Urine:1300]  09/07 1901 - 09/09 0700  In: 4420 [P.O.:840; I.V.:3580]  Out: 2210 [Urine:2210]  P/E  NAD,lying comfortably in bed.  Heent:no nystagmus,perrla,mouth moist.  Lungs cta,no wheezing.  Heart:s1s2 nl,no m/r/g  Abdm:soft,not tender,bs present.  Extr:no edema,good pulses.  Neuro:aaox3.No focal deficit.  Data Review     Recent Results (from the past 12 hour(s))   METABOLIC PANEL, BASIC    Collection Time: 04/05/15  3:55 AM   Result Value Ref Range    Sodium 145 136 - 145 mmol/L    Potassium 3.6 3.5 - 5.5 mmol/L    Chloride 110 (H) 100 - 108 mmol/L    CO2 27 21 - 32 mmol/L    Anion gap 8 3.0 - 18 mmol/L    Glucose 77 74 - 99 mg/dL    BUN 14 7.0 - 18 MG/DL    Creatinine 1.61 0.6 - 1.3 MG/DL    BUN/Creatinine ratio 23 (H) 12 - 20      GFR est AA >60 >60 ml/min/1.69m2    GFR est non-AA >60 >60 ml/min/1.35m2    Calcium 8.8 8.5 - 10.1 MG/DL   PHENYTOIN    Collection Time: 04/05/15  3:55 AM   Result Value Ref Range    Phenytoin 28.6 (H) 10.0 - 20.0 ug/mL         Assessment/Plan:   Recurrent seizures.  Elevated dilantin level  Dilantin toxicity.  HTN    Care Plan  Lab works and consult reviewed.No seizure reported.    Recurrent seizures  Elevated  dilantin level/Dilantin toxicity  -No seizure activity since admission  -Continue lamictal and keppra.  -Hold dilantin.Dilantin level 28.6 today  -Repeat dilantin level in am  -Neurology following,will follow the recommendation.  -EEG and video monitoring for classification of seizures.Pt with a sitter in the room.  -Tele monitoring.     HTN  -Controlled,on lisinopril    -DVT prophylaxis.

## 2015-04-05 NOTE — Progress Notes (Signed)
Patient will return home to [facility] when ready for discharge. Lew Dawes RN ext [phone]

## 2015-04-05 NOTE — Progress Notes (Signed)
1910 Report received; Assessment done.    2200 Scheduled medication given; Pt in bed;asked for ice; given; Sitting in the pt for another hour until the next sitter comes in. Safety measures maintained.    0000 VS taken; Pt in bed; watching TV; EEG in placed; Sitter at the bedside; asked to be taken IV medication; refused to have another one; Informed pt of the reason of the IV; pt acknowledged but still did not want IV; safety measures maintained.    1191 Reassessment; No changed noted; Pt in bed; bed on the lowest position.    0500 Pt asleep; call bell within reach; bed on the lowest position.      Bedside shift change report given to Sabrina,Rn (Cabin crew) by Melina Copa (offgoing nurse). Report included the following information SBAR, Kardex and MAR.

## 2015-04-05 NOTE — Progress Notes (Signed)
Neurology Progress Note    Patient ID:  [name]  [id number]  29 y.o.  Jul 20, 1986    Subjective:      Patient has no seizures and on EEG recording. No more dizziness .  Dilantin level was 28 today.    Current Facility-Administered Medications   Medication Dose Route Frequency   ??? hydrochlorothiazide (HYDRODIURIL) tablet 25 mg  25 mg Oral DAILY   ??? levETIRAcetam (KEPPRA) tablet 500 mg  500 mg Oral BID   ??? lisinopril (PRINIVIL, ZESTRIL) tablet 20 mg  20 mg Oral DAILY   ??? lovastatin (MEVACOR) tablet 20 mg  20 mg Oral QHS   ??? lamoTRIgine (LaMICtal) tablet 100 mg  100 mg Oral BID   ??? acetaminophen (TYLENOL) tablet 650 mg  650 mg Oral Q4H PRN   ??? ondansetron (ZOFRAN) injection 4 mg  4 mg IntraVENous Q6H PRN   ??? 0.9% sodium chloride infusion  150 mL/hr IntraVENous CONTINUOUS            Objective:     Patient Vitals for the past 8 hrs:   BP Temp Pulse Resp SpO2   04/05/15 1534 121/75 97.9 ??F (36.6 ??C) 74 18 99 %   04/05/15 1223 139/90 97.3 ??F (36.3 ??C) 66 18 98 %           Lab Review   Recent Results (from the past 24 hour(s))   METABOLIC PANEL, BASIC    Collection Time: 04/05/15  3:55 AM   Result Value Ref Range    Sodium 145 136 - 145 mmol/L    Potassium 3.6 3.5 - 5.5 mmol/L    Chloride 110 (H) 100 - 108 mmol/L    CO2 27 21 - 32 mmol/L    Anion gap 8 3.0 - 18 mmol/L    Glucose 77 74 - 99 mg/dL    BUN 14 7.0 - 18 MG/DL    Creatinine 4.09 0.6 - 1.3 MG/DL    BUN/Creatinine ratio 23 (H) 12 - 20      GFR est AA >60 >60 ml/min/1.58m2    GFR est non-AA >60 >60 ml/min/1.44m2    Calcium 8.8 8.5 - 10.1 MG/DL   PHENYTOIN    Collection Time: 04/05/15  3:55 AM   Result Value Ref Range    Phenytoin 28.6 (H) 10.0 - 20.0 ug/mL     Imaging:   MRI Results (most recent):  No results found for this or any previous visit.  CT Results (most recent):    Results from Hospital Encounter encounter on 04/01/15   CT HEAD WO CONT   Narrative EXAM: CT head    INDICATION: Seizure.    COMPARISON: February 07, 2013.     TECHNIQUE: Axial CT imaging of the head was performed without intravenous  contrast. Images were reconstructed in the coronal and sagittal planes.    One or more dose reduction techniques were used on this CT: automated exposure  control, adjustment of the mAs and/or kVp according to patient's size, and  iterative reconstruction techniques. The specific techniques utilized on this CT  exam have been documented in the patient's electronic medical record.    Note: Preliminary report sent to the Emergency Department by the radiology  resident at the time of the study.    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 There is no intracranial  hemorrhage, mass effect, or  midline shift. There are no acute areas of abnormal  parenchymal attenuation.    EXTRA-AXIAL SPACES AND MENINGES: There are no abnormal extra-axial fluid  collections.    CALVARIUM: Again identified are postsurgical changes to the frontal calvarium.  No acute abnormality identified.    SINUSES: Mild mucosal thickening in the ethmoid air cells.    OTHER: None.    _______________         Impression IMPRESSION:    1.  No acute intracranial abnormalities.  2. 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 Stable postsurgical changes in the frontal calvarium.          Additional comments: reviewed above        NEUROLOGICAL EXAM:    Appearance:  The patient is well developed, provides a coherent history and is in no acute distress.   Mental Status: Oriented to time, place and person.    Cranial Nerves:    Facial movement is symmetric. Hearing is normal bilaterally.   No focal weakness of his limbs. Gait is deferred.               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  Has no seizures and will keep EEG and video monitoring to classify his recent frequent seizure activities.  Dilantin was held due to his toxicity. Once his dilantin become therapeutic, will discharge his home with higher dose of  Keppra and Lamictal.   Dilantin toxicity: daily level and cardiac monitoring.     Signed:  Judithe Modest, MD  04/05/2015  4:26 PM

## 2015-04-05 NOTE — Other (Signed)
Bedside and Verbal shift change report given to Allegra Lewis,RN (oncoming nurse) by CAMERON G MCCUBBINS, RN (offgoing nurse). Report included the following information SBAR, Kardex, Intake/Output and MAR.

## 2015-04-06 LAB — PHENYTOIN: Phenytoin: 24.2 ug/mL — ABNORMAL HIGH (ref 10.0–20.0)

## 2015-04-06 LAB — CBC WITH AUTOMATED DIFF
ABS. BASOPHILS: 0 10*3/uL (ref 0.0–0.06)
ABS. EOSINOPHILS: 0.2 10*3/uL (ref 0.0–0.4)
ABS. LYMPHOCYTES: 2 10*3/uL (ref 0.9–3.6)
ABS. MONOCYTES: 0.4 10*3/uL (ref 0.05–1.2)
ABS. NEUTROPHILS: 2.6 10*3/uL (ref 1.8–8.0)
BASOPHILS: 0 % (ref 0–2)
EOSINOPHILS: 3 % (ref 0–5)
HCT: 40 % (ref 36.0–48.0)
HGB: 13.3 g/dL (ref 13.0–16.0)
LYMPHOCYTES: 38 % (ref 21–52)
MCH: 30.4 PG (ref 24.0–34.0)
MCHC: 33.3 g/dL (ref 31.0–37.0)
MCV: 91.5 FL (ref 74.0–97.0)
MONOCYTES: 8 % (ref 3–10)
MPV: 11.2 FL (ref 9.2–11.8)
NEUTROPHILS: 51 % (ref 40–73)
PLATELET: 208 10*3/uL (ref 135–420)
RBC: 4.37 M/uL — ABNORMAL LOW (ref 4.70–5.50)
RDW: 12.3 % (ref 11.6–14.5)
WBC: 5.2 10*3/uL (ref 4.6–13.2)

## 2015-04-06 LAB — METABOLIC PANEL, BASIC
Anion gap: 5 mmol/L (ref 3.0–18)
BUN/Creatinine ratio: 18 (ref 12–20)
BUN: 14 MG/DL (ref 7.0–18)
CO2: 33 mmol/L — ABNORMAL HIGH (ref 21–32)
Calcium: 8.9 MG/DL (ref 8.5–10.1)
Chloride: 107 mmol/L (ref 100–108)
Creatinine: 0.77 MG/DL (ref 0.6–1.3)
GFR est AA: >60 mL/min/{1.73_m2} (ref >60)
GFR est non-AA: >60 mL/min/{1.73_m2} (ref >60)
Glucose: 100 mg/dL — ABNORMAL HIGH (ref 74–99)
Potassium: 3.7 mmol/L (ref 3.5–5.5)
Sodium: 145 mmol/L (ref 136–145)

## 2015-04-06 MED FILL — HYDROCHLOROTHIAZIDE 25 MG TAB: 25 mg | ORAL | Qty: 1

## 2015-04-06 MED FILL — LISINOPRIL 20 MG TAB: 20 mg | ORAL | Qty: 1

## 2015-04-06 MED FILL — LEVETIRACETAM 500 MG TAB: 500 mg | ORAL | Qty: 1

## 2015-04-06 MED FILL — LAMOTRIGINE 100 MG TAB: 100 mg | ORAL | Qty: 1

## 2015-04-06 MED FILL — LOVASTATIN 20 MG TAB: 20 mg | ORAL | Qty: 1

## 2015-04-06 NOTE — Progress Notes (Signed)
Hospitalist Progress Note    Subjective:   Daily Progress Note: 04/06/2015 3:53 PM  H/O Seizures,brought to ER because of recurrent seizures.Pt says that he had 1 seizures before his arrival to the emergency room.He takes dilantin,keppra and lamictal and he thinks that these meds are not working anymore.Says that the last time he saw his neurologist was 02/2016and was doing fine at that time.In the ER,dilantin level was 47.5.No seizure since admitted to the medical floor.    Current Facility-Administered Medications   Medication Dose Route Frequency   ??? hydrochlorothiazide (HYDRODIURIL) tablet 25 mg  25 mg Oral DAILY   ??? levETIRAcetam (KEPPRA) tablet 500 mg  500 mg Oral BID   ??? lisinopril (PRINIVIL, ZESTRIL) tablet 20 mg  20 mg Oral DAILY   ??? lovastatin (MEVACOR) tablet 20 mg  20 mg Oral QHS   ??? lamoTRIgine (LaMICtal) tablet 100 mg  100 mg Oral BID   ??? acetaminophen (TYLENOL) tablet 650 mg  650 mg Oral Q4H PRN   ??? ondansetron (ZOFRAN) injection 4 mg  4 mg IntraVENous Q6H PRN   ??? 0.9% sodium chloride infusion  150 mL/hr IntraVENous CONTINUOUS        Review of Systems  No seizure since admission.The sitter present in the room does not report any seizure.Feeling fine today.    Objective:     Visit Vitals   ??? BP 129/71 (BP 1 Location: Left arm, BP Patient Position: At rest)   ??? Pulse 66   ??? Temp 98.2 ??F (36.8 ??C)   ??? Resp 18   ??? Ht  (1.778 m)   ??? Wt 108.9 kg (240 lb)   ??? SpO2 96%   ??? BMI 34.44 kg/m2      O2 Device: Room air    Temp (24hrs), Avg:97.8 ??F (36.6 ??C), Min:97.4 ??F (36.3 ??C), Max:98.2 ??F (36.8 ??C)         09/08 1901 - 09/10 0700  In: 2855 [P.O.:930; I.V.:1925]  Out: 3000 [Urine:3000]  P/E  NAD,lying comfortably in bed.  Heent:no nystagmus,perrla,mouth moist.  Lungs cta,no wheezing.  Heart:s1s2 nl,no m/r/g  Abdm:soft,not tender,bs present.  Extr:no edema,good pulses.  Neuro:aaox3.No focal deficit.  Data Review    Recent Results (from the past 12 hour(s))   CBC WITH AUTOMATED DIFF     Collection Time: 04/06/15  4:30 AM   Result Value Ref Range    WBC 5.2 4.6 - 13.2 K/uL    RBC 4.37 (L) 4.70 - 5.50 M/uL    HGB 13.3 13.0 - 16.0 g/dL    HCT 52.8 41.3 - 24.4 %    MCV 91.5 74.0 - 97.0 FL    MCH 30.4 24.0 - 34.0 PG    MCHC 33.3 31.0 - 37.0 g/dL    RDW 01.0 27.2 - 53.6 %    PLATELET 208 135 - 420 K/uL    MPV 11.2 9.2 - 11.8 FL    NEUTROPHILS 51 40 - 73 %    LYMPHOCYTES 38 21 - 52 %    MONOCYTES 8 3 - 10 %    EOSINOPHILS 3 0 - 5 %    BASOPHILS 0 0 - 2 %    ABS. NEUTROPHILS 2.6 1.8 - 8.0 K/UL    ABS. LYMPHOCYTES 2.0 0.9 - 3.6 K/UL    ABS. MONOCYTES 0.4 0.05 - 1.2 K/UL    ABS. EOSINOPHILS 0.2 0.0 - 0.4 K/UL    ABS. BASOPHILS 0.0 0.0 - 0.06 K/UL    DF AUTOMATED  PHENYTOIN    Collection Time: 04/06/15  4:30 AM   Result Value Ref Range    Phenytoin 24.2 (H) 10.0 - 20.0 ug/mL   METABOLIC PANEL, BASIC    Collection Time: 04/06/15  4:30 AM   Result Value Ref Range    Sodium 145 136 - 145 mmol/L    Potassium 3.7 3.5 - 5.5 mmol/L    Chloride 107 100 - 108 mmol/L    CO2 33 (H) 21 - 32 mmol/L    Anion gap 5 3.0 - 18 mmol/L    Glucose 100 (H) 74 - 99 mg/dL    BUN 14 7.0 - 18 MG/DL    Creatinine 1.61 0.6 - 1.3 MG/DL    BUN/Creatinine ratio 18 12 - 20      GFR est AA >60 >60 ml/min/1.76m2    GFR est non-AA >60 >60 ml/min/1.29m2    Calcium 8.9 8.5 - 10.1 MG/DL         Assessment/Plan:   Recurrent seizures.  Elevated dilantin level  Dilantin toxicity.  HTN    Care Plan  Lab works and consult reviewed.No seizure reported.    Recurrent seizures  Elevated dilantin level/Dilantin toxicity  -No seizure activity since admission  -Continue lamictal and keppra.  -Hold dilantin.Dilantin level 24.2 today  -Repeat dilantin level in am  -Neurology following and plan is to have the dilantin within the therapeutic rage.Then consider d/c pt on adjusted doses of keppra and lamictal.  -EEG and video monitoring for classification of seizures.Pt with a sitter in the room.  -Tele monitoring.     HTN  -Controlled,on lisinopril     -DVT prophylaxis.

## 2015-04-06 NOTE — Progress Notes (Signed)
CLTM has been disconnected.  Dr. Regino Schultze was notified regarding LTM being disconnected due to patient wanting to leave AMA.

## 2015-04-06 NOTE — Progress Notes (Signed)
Neurology Progress Note    Patient ID:  [name]  [id number]  29 y.o.  [birth year]    Subjective:      Patient has no seizures while on EEG recording. No dizziness and his Dilantin level was 24 today.  He will like to leave hospital against medial advice if he is not get discharged.     Current Facility-Administered Medications   Medication Dose Route Frequency   ??? hydrochlorothiazide (HYDRODIURIL) tablet 25 mg  25 mg Oral DAILY   ??? levETIRAcetam (KEPPRA) tablet 500 mg  500 mg Oral BID   ??? lisinopril (PRINIVIL, ZESTRIL) tablet 20 mg  20 mg Oral DAILY   ??? lovastatin (MEVACOR) tablet 20 mg  20 mg Oral QHS   ??? lamoTRIgine (LaMICtal) tablet 100 mg  100 mg Oral BID   ??? acetaminophen (TYLENOL) tablet 650 mg  650 mg Oral Q4H PRN   ??? ondansetron (ZOFRAN) injection 4 mg  4 mg IntraVENous Q6H PRN   ??? 0.9% sodium chloride infusion  150 mL/hr IntraVENous CONTINUOUS            Objective:     Patient Vitals for the past 8 hrs:   BP Temp Pulse Resp SpO2   04/06/15 1510 (!) 158/97 98.2 ??F (36.8 ??C) 66 18 100 %           Lab Review   Recent Results (from the past 24 hour(s))   CBC WITH AUTOMATED DIFF    Collection Time: 04/06/15  4:30 AM   Result Value Ref Range    WBC 5.2 4.6 - 13.2 K/uL    RBC 4.37 (L) 4.70 - 5.50 M/uL    HGB 13.3 13.0 - 16.0 g/dL    HCT 29.5 62.1 - 30.8 %    MCV 91.5 74.0 - 97.0 FL    MCH 30.4 24.0 - 34.0 PG    MCHC 33.3 31.0 - 37.0 g/dL    RDW 65.7 84.6 - 96.2 %    PLATELET 208 135 - 420 K/uL    MPV 11.2 9.2 - 11.8 FL    NEUTROPHILS 51 40 - 73 %    LYMPHOCYTES 38 21 - 52 %    MONOCYTES 8 3 - 10 %    EOSINOPHILS 3 0 - 5 %    BASOPHILS 0 0 - 2 %    ABS. NEUTROPHILS 2.6 1.8 - 8.0 K/UL    ABS. LYMPHOCYTES 2.0 0.9 - 3.6 K/UL    ABS. MONOCYTES 0.4 0.05 - 1.2 K/UL    ABS. EOSINOPHILS 0.2 0.0 - 0.4 K/UL    ABS. BASOPHILS 0.0 0.0 - 0.06 K/UL    DF AUTOMATED     PHENYTOIN    Collection Time: 04/06/15  4:30 AM   Result Value Ref Range    Phenytoin 24.2 (H) 10.0 - 20.0 ug/mL   METABOLIC PANEL, BASIC     Collection Time: 04/06/15  4:30 AM   Result Value Ref Range    Sodium 145 136 - 145 mmol/L    Potassium 3.7 3.5 - 5.5 mmol/L    Chloride 107 100 - 108 mmol/L    CO2 33 (H) 21 - 32 mmol/L    Anion gap 5 3.0 - 18 mmol/L    Glucose 100 (H) 74 - 99 mg/dL    BUN 14 7.0 - 18 MG/DL    Creatinine 9.52 0.6 - 1.3 MG/DL    BUN/Creatinine ratio 18 12 - 20      GFR est AA >  60 >60 ml/min/1.76m2    GFR est non-AA >60 >60 ml/min/1.81m2    Calcium 8.9 8.5 - 10.1 MG/DL     Imaging:   MRI Results (most recent):  No results found for this or any previous visit.  CT Results (most recent):    Results from Hospital Encounter encounter on 04/01/15   CT HEAD WO CONT     Impression IMPRESSION:    1.  No acute intracranial abnormalities.  2. 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 Stable postsurgical changes in the frontal calvarium.          Additional comments: reviewed above        NEUROLOGICAL EXAM:    Appearance:  The patient is well developed, provides a coherent history and is in no acute distress.   Mental Status: Oriented to time, place and person.    Cranial Nerves:    Facial movement is symmetric. Hearing is normal bilaterally.   No focal weakness of his limbs.                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 Has no seizures recorded  while on EEG and video monitoring.    Since he had frequent seizures while he was Dilantin toxic. I do not think dilantin controls his seizures ,it may even worsens his seizures. Therefore I recommend take dilantin off his regime and just keep Keppra and Lamictal. Will increase his Lamictal dose to 100 mg qam and 150 mg qpm for 1 week and then keep 150 mg bid. He will keep Keppra at 1500 mg bid.   Dilantin toxicity: improved with level at 24.  Will see him back in the office in 2 weeks for follow up.    No driving and no climbing height and operating heavy machinery and swimming alone.   Seizure precaution is discussed.     Signed:  Judithe Modest, MD  04/06/2015  4:26 PM

## 2015-04-06 NOTE — Procedures (Signed)
[facility]                  [address] [place], [place]  [address]                              NEUROSCIENCE CENTER                             ELECTROENCEPHALOGRAM    PATIENT:     [name], [name]  CSN:            [id number]       DATE:       04/02/2015  MRN:            [id number]        LOCATION:  DICTATING:   Kathrine Haddock, MD      ELECTROENCEPHALOGRAM NUMBER:  [id number].    REFERRING PHYSICIAN:  Dr. Modesto Charon.    STUDY:  This inpatient video EEG monitoring was ordered by Dr. Modesto Charon for  this 29 year old gentleman with history of traumatic brain injury and  seizure disorder, who came in with multiple seizures.    MEDICATIONS  1. Keppra.  2. Dilantin.  3. Lamotrigine.    The recording starting on 04/02/2015 at 10:53 p.m. and ending on 04/06/2015  at 6:36 p.m.    The International 10-20 system was used during this recording.    Spike detection program was utilized during this study.    Review of EEG symbols during the awake state showed symmetrical and  posterior dominant 10 Hz alpha rhythm that attenuated well with eye  opening.  There was intermittent appearance of diffuse low voltage beta  activities.    Review of EEG symbols during the sleep state showed normal sleep pattern.    During sleep, there was intermittent right frontal slowing.    The spike detection program did not detect any spike activities and the  seizure detection program did not detect any seizure activities.    There was no witnessed or reported clinical events during this study.    CLINICAL INTERPRETATION:  This multi-day inpatient video EEG monitoring was  obtained in the awake and sleeping states and showed intermittent right  frontal slowing during sleep, indicative of underlying neuronal  dysfunction.  No epileptiform activities were noted.  No clinical events  were captured by this study.                      Kathrine Haddock, MD    FB:wmx  D: 04/08/2015 T: 04/08/2015 12:47 P   CQDocID #:  [id number]  CScriptDoc #:  [id number]  cc:   Kathrine Haddock, MD        Darnelle Catalan, MD

## 2015-04-06 NOTE — Progress Notes (Signed)
1610 Received bedside report from Sarah, RN, updated white board, call bell placed within reach. Patient has a Comptroller.     0930 Patient is doing well at this time. I stressed the importance of a cardiac diet which the patient is not following at this time. Call bell placed within reach.     1100 Patient is asking for a razor at this time to shave if he is due to be discharged today. Call bell is within reach.    1400 Patient is asking to be discharged today or leave AMA. Call placed to Dr. Orlinda Blalock about the patient's wishes. Doctor said he will be here today talk with the patient.     1530 Spoke with Dr. Orlinda Blalock and he said let patient sign the AMA papers.    1545 Care transferred to [facility] [place]

## 2015-04-06 NOTE — Progress Notes (Addendum)
1545 - assumed pt care. Pt has advised that he would like to leave AMA. Call placed to have EKG leads removed.   1700 - Dr. Regino Schultze rounded on pt, when asked why he wants to leave he stated states that he phenytoin levels have gone down and d/t concerns with his care, he advised that he wanted to go home. He advised that he had spoken with his significant other and agreed that he should leave.  1952 - pt left floor AMA upon signing paper work.

## 2015-04-06 NOTE — Discharge Summary (Signed)
Discharge Summary    Patient: [name]               Sex: male          DOA: 04/01/2015         Date of Birth:  19-Aug-1985      Age:  29 y.o.        LOS:  LOS: 0 days                Admit Date: 04/01/2015    Discharge Date: 04/07/2015    Admission Diagnoses: recurrent seizures.    Discharge Diagnoses:   Recurrent seizures  Dilantin toxicity  Non-compliance,left AMA  Bipolar.     Problem List as of 04/06/2015  Date Reviewed: 14-Mar-2013          Codes Class Noted - Resolved    Dilantin toxicity ICD-10-CM: T42.0X1A  ICD-9-CM: 966.1, E980.4  04/02/2015 - Present        Recurrent seizures (HCC) ICD-10-CM: G40.909  ICD-9-CM: 345.80  02/21/2013 - Present        Bipolar disorder with severe depression (HCC) ICD-10-CM: F31.4  ICD-9-CM: 296.53  02/14/2013 - Present        History of suicidal ideation ICD-10-CM: Z86.59  ICD-9-CM: V11.8  02/11/2013 - Present        Psychosis ICD-10-CM: F29  ICD-9-CM: 298.9  02/11/2013 - Present        Localization-related (focal) (partial) epilepsy and epileptic syndromes with complex partial seizures, with intractable epilepsy ICD-10-CM: G40.219  ICD-9-CM: 345.41  01/25/2012 - Present        Non-compliance with treatment (Chronic) ICD-10-CM: Z91.19  ICD-9-CM: V15.81  10/01/2010 - Present        Malingering ICD-10-CM: Z76.5  ICD-9-CM: V65.2  10/01/2010 - Present    Overview Signed 10/01/2010  6:21 PM by Mackie Pai, MD     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             Polysubstance dependence (HCC) (Chronic) ICD-10-CM: Z61.09  ICD-9-CM: 304.80  10/01/2010 - Present        Polysubstance overdose ICD-10-CM: T50.901A  ICD-9-CM: 977.9, E980.5 Chronic 08/22/2010 - Present        Adjustment disorder with depressed mood ICD-10-CM: F43.21  ICD-9-CM: 309.0  07/29/2010 - Present        Borderline personality disorder ICD-10-CM: F60.3  ICD-9-CM: 301.83  07/29/2010 - Present        RESOLVED: Opioid overdose ICD-10-CM: T40.2X1A  ICD-9-CM: 965.09, E980.0  02/10/2013 - 02/11/2013         RESOLVED: Narcotic overdose ICD-10-CM: T40.601A  ICD-9-CM: 967.9, E980.2  02/10/2013 - 02/11/2013              Discharge Medications:  Keppra  Lamictal      Follow-up:neurology in 2 weeks.    Discharge Condition:patient left AMA    ACTIVITY:No driving,no operating heavy machines,no swimming alone in 6 months.    Labs:  Labs: Results:       Chemistry Recent Labs      04/06/15   0430  04/05/15   0355   GLU  100*  77   NA  145  145   K  3.7  3.6   CL  107  110*   CO2  33*  27   BUN  14  14   CREA  0.77  0.60   CA  8.9  8.8   AGAP  5  8   BUCR  18  23*  CBC w/Diff Recent Labs      04/06/15   0430   WBC  5.2   RBC  4.37*   HGB  13.3   HCT  40.0   PLT  208   GRANS  51   LYMPH  38   EOS  3      Cardiac Enzymes No results for input(s): CPK, CKND1, MYO in the last 72 hours.    No lab exists for component: CKRMB, TROIP   Coagulation No results for input(s): PTP, INR, APTT in the last 72 hours.    No lab exists for component: INREXT    Lipid Panel Lab Results   Component Value Date/Time    CHOLESTEROL, TOTAL 168 02/10/2013 12:52 AM    HDL CHOLESTEROL 36 02/10/2013 12:52 AM    LDL, CALCULATED 112.8 02/10/2013 12:52 AM    VLDL, CALCULATED 19.2 02/10/2013 12:52 AM    TRIGLYCERIDE 96 02/10/2013 12:52 AM    CHOL/HDL RATIO 4.7 02/10/2013 12:52 AM      BNP No results for input(s): BNPP in the last 72 hours.   Liver Enzymes No results for input(s): TP, ALB, TBIL, AP, SGOT, GPT in the last 72 hours.    No lab exists for component: DBIL   Thyroid Studies Lab Results   Component Value Date/Time    TSH 2.00 07/28/2010 05:10 PM          Imaging:  CT scan of head on 04/01/2015:  1. No acute intracranial abnormalities.  2. 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 Stable postsurgical changes in the frontal calvarium.    Consults:   Neurology.    Treatment Team: Treatment Team: Consulting Provider: Flora Lipps, MD; Consulting Provider: Darnelle Catalan, MD; Utilization Review:  Zenaida Niece; Consulting Provider: Judithe Modest, MD; Utilization Review: Stan Head    Significant Diagnostic Studies:  [id number].    Hospital Course:   H/O Seizures,brought to ER because of recurrent seizures.Pt said that he had 1 seizures before his arrival to the emergency room.He takes dilantin,keppra and lamictal and he thinks that these meds are not working anymore.York Spaniel that the last time he saw his neurologist was 08/2014 and was doing fine at that time.In the ER,dilantin level was 47.5.He was then admitted for recurrent seizures and dilantin toxicity.Patient was evaluated and EEG showed focal cerebral dysfunction in the right frontal and anterior temporal region.Neurology ordered to keep EEG and video monitoring to classify his resent frequent seizure activity.So far patient did not have a seizure since his admission to the medical/tele floor.Also the dilantin level  was repeated daily and continued to go down.The plan has been to get the dilantin within the therapeutic range,then d/c it as it seemed that it was not working for this patient.Then increase the dose of keppra and lamictal.On 9/10,dilantin level was 24.2.Patient decided to leave the hospital AMA although he was advised not to do so.I educated patient on the risk of leaving the hospital without completion his treatment but he replied that he was not interested in staying.He then signed the [facility] papers and left.    Time for patient's education/discharge:45 minutes.  ??    Darnelle Catalan, MD  April 07, 2015

## 2015-04-08 NOTE — Procedures (Signed)
[facility]                  [address], [place], [place]  [address]                              NEUROSCIENCE CENTER                             ELECTROENCEPHALOGRAM    PATIENT:     [name], [name]  CSN:            [id number]       DATE:       04/02/2015  MRN:            [id number]        LOCATION:  DICTATING:   Kathrine Haddock, MD      ELECTROENCEPHALOGRAM NUMBER:  [id number].    REFERRING PHYSICIAN:  Dr. Modesto Charon.    STUDY:  This inpatient video EEG monitoring was ordered by Dr. Modesto Charon for  this 29 year old gentleman with history of traumatic brain injury and  seizure disorder, who came in with multiple seizures.    MEDICATIONS  1. Keppra.  2. Dilantin.  3. Lamotrigine.    The recording starting on 04/02/2015 at 10:53 p.m. and ending on 04/06/2015  at 6:36 p.m.    The International 10-20 system was used during this recording.    Spike detection program was utilized during this study.    Review of EEG symbols during the awake state showed symmetrical and  posterior dominant 10 Hz alpha rhythm that attenuated well with eye  opening.  There was intermittent appearance of diffuse low voltage beta  activities.    Review of EEG symbols during the sleep state showed normal sleep pattern.    During sleep, there was intermittent right frontal slowing.    The spike detection program did not detect any spike activities and the  seizure detection program did not detect any seizure activities.    There was no witnessed or reported clinical events during this study.    CLINICAL INTERPRETATION:  This multi-day inpatient video EEG monitoring was  obtained in the awake and sleeping states and showed intermittent right  frontal slowing during sleep, indicative of underlying neuronal  dysfunction.  No epileptiform activities were noted.  No clinical events  were captured by this study.                      Kathrine Haddock, MD    FB:wmx  D: 04/08/2015 T: 04/08/2015 12:47  P  CQDocID #:  [id number]  CScriptDoc #:  [id number]  cc:   Kathrine Haddock, MD        Darnelle Catalan, MD

## 2015-04-12 DIAGNOSIS — F339 Major depressive disorder, recurrent, unspecified: Secondary | ICD-10-CM

## 2015-04-12 LAB — DRUG SCREEN, URINE
AMPHETAMINES: NEGATIVE
BARBITURATES: NEGATIVE
BENZODIAZEPINES: POSITIVE — AB
COCAINE: NEGATIVE
METHADONE: NEGATIVE
OPIATES: NEGATIVE
PCP(PHENCYCLIDINE): NEGATIVE
THC (TH-CANNABINOL): POSITIVE — AB

## 2015-04-12 NOTE — ED Notes (Addendum)
Patient asleep on the stretcher, no signs of distress noted at this time.
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
## 2015-04-12 NOTE — ED Notes (Signed)
Patient provided graham crackers, saltines, and ginger ale per request.

## 2015-04-12 NOTE — ED Triage Notes (Addendum)
Pt,  States  "  I  Have  A bad depression for 2  Weeks, to the point  That I can't  Do anything I normally do," " i don't want to go to suicidal mode as I did  Before" I'm not to that point yet"

## 2015-04-12 NOTE — ED Notes (Addendum)
Patient reports feeling increasingly depressed over the past few weeks after losing his mother and father recently. Patient reports that he is not suicidal yet but that he has been in the past and knows he is close to the edge. Belongings put away, patient changed into gown and red socks.

## 2015-04-13 ENCOUNTER — Inpatient Hospital Stay
Admit: 2015-04-13 | Discharge: 2015-04-16 | Disposition: A | Discharge status: Home / Self Care | Payer: MEDICARE | Attending: Psychiatry | Admitting: Psychiatry

## 2015-04-13 LAB — ETHYL ALCOHOL: ALCOHOL(ETHYL),SERUM: <3 MG/DL (ref 0–3)

## 2015-04-13 MED ORDER — HYDROCHLOROTHIAZIDE 25 MG TAB
25 mg | Freq: Every day | ORAL | Status: DC
Start: 2015-04-13 — End: 2015-04-16
  Administered 2015-04-14 – 2015-04-16 (×2): via ORAL

## 2015-04-13 MED ORDER — HALOPERIDOL 5 MG TAB
5 mg | ORAL | Status: DC | PRN
Start: 2015-04-13 — End: 2015-04-16
  Administered 2015-04-14 – 2015-04-16 (×4): via ORAL

## 2015-04-13 MED ORDER — LISINOPRIL 20 MG TAB
20 mg | Freq: Every day | ORAL | Status: DC
Start: 2015-04-13 — End: 2015-04-16
  Administered 2015-04-14 – 2015-04-16 (×2): via ORAL

## 2015-04-13 MED ORDER — BENZTROPINE 1 MG/ML IJ SOLN
1 mg/mL | INTRAMUSCULAR | Status: DC | PRN
Start: 2015-04-13 — End: 2015-04-16

## 2015-04-13 MED ORDER — HYDROXYZINE PAMOATE 50 MG CAP
50 mg | ORAL | Status: DC | PRN
Start: 2015-04-13 — End: 2015-04-16

## 2015-04-13 MED ORDER — LEVETIRACETAM 500 MG TAB
500 mg | Freq: Two times a day (BID) | ORAL | Status: DC
Start: 2015-04-13 — End: 2015-04-13
  Administered 2015-04-14: 01:00:00 via ORAL

## 2015-04-13 MED ORDER — TRAZODONE 50 MG TAB
50 mg | Freq: Every evening | ORAL | Status: DC | PRN
Start: 2015-04-13 — End: 2015-04-16
  Administered 2015-04-16: 01:00:00 via ORAL

## 2015-04-13 MED ORDER — BENZTROPINE 1 MG TAB
1 mg | ORAL | Status: DC | PRN
Start: 2015-04-13 — End: 2015-04-16

## 2015-04-13 MED ORDER — LORAZEPAM 2 MG/ML IJ SOLN
2 mg/mL | INTRAMUSCULAR | Status: DC | PRN
Start: 2015-04-13 — End: 2015-04-16

## 2015-04-13 MED ORDER — HALOPERIDOL LACTATE 5 MG/ML IJ SOLN
5 mg/mL | INTRAMUSCULAR | Status: DC | PRN
Start: 2015-04-13 — End: 2015-04-16

## 2015-04-13 MED ORDER — LAMOTRIGINE 25 MG TAB
25 mg | Freq: Two times a day (BID) | ORAL | Status: DC
Start: 2015-04-13 — End: 2015-04-16
  Administered 2015-04-14 – 2015-04-16 (×6): via ORAL

## 2015-04-13 MED ORDER — ACETAMINOPHEN 325 MG TABLET
325 mg | ORAL | Status: DC | PRN
Start: 2015-04-13 — End: 2015-04-16

## 2015-04-13 MED ORDER — CLONAZEPAM 0.5 MG TAB
0.5 mg | Freq: Two times a day (BID) | ORAL | Status: DC
Start: 2015-04-13 — End: 2015-04-15
  Administered 2015-04-14 – 2015-04-15 (×4): via ORAL

## 2015-04-13 NOTE — Behavioral Health Treatment Team (Cosign Needed)
[name] is not participating in Recreational Therapy.     Group time: 45 minutes    Personal goal for participation: socializing while playing games with peers    Goal orientation: passive    Group therapy participation: refuse    Therapeutic interventions reviewed and discussed:  Staff encouraged Pt to participate in group    Impression of participation: Pt refuse, chose to rest in bed, despite staff encouragement

## 2015-04-13 NOTE — ED Notes (Signed)
Patient resting comfortably, no signs of distress noted. Patient to receive next available bed this morning per crisis nurse.

## 2015-04-13 NOTE — ED Notes (Signed)
Patient asleep on stretcher. No signs of distress noted.

## 2015-04-13 NOTE — ED Notes (Signed)
Crisis nurse in the room with the patient at this time.

## 2015-04-13 NOTE — ED Notes (Signed)
Patient asleep on the stretcher, no new complaints at this time. No signs of distress noted. Will continue to monitor.

## 2015-04-13 NOTE — ED Notes (Signed)
Pt to behavioral health department with security guard and ED tech
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
## 2015-04-13 NOTE — ED Notes (Signed)
Patient sleeping on the stretcher at this time, no signs of distress noted. Will continue to monitor.

## 2015-04-13 NOTE — ED Notes (Signed)
Patient asleep on the stretcher, no signs of distress noted at this time.

## 2015-04-13 NOTE — ED Notes (Signed)
Patient provided healthy choice meal, saltines, and ginger ale per request.
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
## 2015-04-13 NOTE — ED Notes (Signed)
Patient asleep on the stretcher, no new complaints. No signs of distress noted.

## 2015-04-13 NOTE — Other (Signed)
Received report from Scarlett Presto.  Dr.Marietta contacted for admission request and orders received.    Client does not have medication bottles with him, does not recall name of current pharmacy, formerly used [facility]. Dr. Reola Calkins unable to access [facility] [place] at present. [facility] EMR from 04/01/15 thru 04/07/15 does not list Klonopin as being prescribed.  Per Dr.Marietta, benzodiazepine taper is initiated for positive UDSC, client's report of using Klonopin 1 mg three times per day with his Lamictal & Keppra for neurologic and mental health reasons.    DISPOSITION: admit to [facility], report to unit.

## 2015-04-13 NOTE — Behavioral Health Treatment Team (Signed)
Patient stated he usually takes Keppra 1500 MG not 500 MG. MD notified. MD ask that medication be verified with patient pharmacy [facility] [address] [place]). Pharmacy confirmed  that medication dosage is 1500 MG. See MAR  modification

## 2015-04-13 NOTE — ED Notes (Signed)
Bedside shift change report given to Alexis, RN (oncoming nurse) by Amanda, RN (offgoing nurse). Report included the following information SBAR, ED Summary and MAR.

## 2015-04-13 NOTE — Other (Signed)
TRANSFER - OUT REPORT:    Verbal report given to nurse(name) on Shela Commons  being transferred to behavioral health room 122(unit) for routine progression of care       Report consisted of patient???s Situation, Background, Assessment and   Recommendations(SBAR).     Information from the following report(s) SBAR, ED Summary, Procedure Summary and Recent Results was reviewed with the receiving nurse.    Lines:       Opportunity for questions and clarification was provided.      Patient transported with:   [employer]

## 2015-04-13 NOTE — ED Notes (Addendum)
Patient asleep on stretcher. No signs of distress noted.

## 2015-04-14 MED ORDER — LEVETIRACETAM 500 MG TAB
500 mg | Freq: Two times a day (BID) | ORAL | Status: DC
Start: 2015-04-14 — End: 2015-04-16
  Administered 2015-04-14 – 2015-04-16 (×5): via ORAL

## 2015-04-14 MED ORDER — MIRTAZAPINE 15 MG TAB
15 mg | Freq: Every evening | ORAL | Status: DC
Start: 2015-04-14 — End: 2015-04-15
  Administered 2015-04-15: 01:00:00 via ORAL

## 2015-04-14 MED ORDER — LEVETIRACETAM 500 MG TAB
500 mg | Freq: Once | ORAL | Status: AC
Start: 2015-04-14 — End: 2015-04-13
  Administered 2015-04-14: 02:00:00 via ORAL

## 2015-04-14 MED FILL — HALOPERIDOL 5 MG TAB: 5 mg | ORAL | Qty: 1

## 2015-04-14 MED FILL — CLONAZEPAM 0.5 MG TAB: 0.5 mg | ORAL | Qty: 2

## 2015-04-14 MED FILL — LAMOTRIGINE 25 MG TAB: 25 mg | ORAL | Qty: 2

## 2015-04-14 MED FILL — LEVETIRACETAM 500 MG TAB: 500 mg | ORAL | Qty: 3

## 2015-04-14 MED FILL — LISINOPRIL 10 MG TAB: 10 mg | ORAL | Qty: 2

## 2015-04-14 MED FILL — LEVETIRACETAM 500 MG TAB: 500 mg | ORAL | Qty: 2

## 2015-04-14 MED FILL — LEVETIRACETAM 500 MG TAB: 500 mg | ORAL | Qty: 1

## 2015-04-14 MED FILL — HYDROCHLOROTHIAZIDE 25 MG TAB: 25 mg | ORAL | Qty: 1
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
## 2015-04-14 NOTE — Behavioral Health Treatment Team (Signed)
Group Note: The above pt participated in recreational group and appeared to enjoy eating outside and talking to his peers during group. He played basketball and listen to music while outside with staff and peers.

## 2015-04-14 NOTE — Behavioral Health Treatment Team (Signed)
Pt. Isolated sleeping in bed most of the shift coming out for dinner than later on joined peers in the day area watching a movie.  Pt. denies SI/HI. AV/H. Pt contracts for safety on the unit. Pt.denies any new medical/pain issues. Pt. completed ADL's. Pt. ate 100% of meals and has been compliant with medications. Pt. refused to participate in group, despite staff encouragement.  Pt. did not have any visitors. Staff encouraged Pt. to continue participating in treatment and  medication therapy. Pt agreed. Pt. remain free of falls and utilized non skid socks. Staff will continue to monitor Pt. for behavior safety and location.

## 2015-04-14 NOTE — Progress Notes (Signed)
Problem: Depressed Mood (Adult/Pediatric)  Goal: *STG: Participates in 1:1 therapy sessions  AEB participating in 1:1 session with staff daily.   Outcome: Progressing Towards Goal  Verbal during 1:1 and group. Required redirection during group as related to frequent references to his sexual preference. Had difficulty staying on task.    Problem: Seizure Disorder (Adult)  Goal: *STG: Remains free of seizure activity  AEB no seizure activity during hospitalization.   Outcome: Progressing Towards Goal  No seizure activity this shift. Verbal regarding the need to continue his seizure medication at the correct dose.

## 2015-04-14 NOTE — Behavioral Health Treatment Team (Signed)
Psych admission note dictated [phone]

## 2015-04-14 NOTE — Behavioral Health Treatment Team (Signed)
GROUP THERAPY PROGRESS NOTE    [name] is not participating in Leisure-Creative Group.     Group time: 30 minutes    Personal goal for participation:  Positive changes    Goal orientation: personal    Group therapy participation: refuse    Therapeutic interventions reviewed and discussed: Staff encouraged Pt. To think positive    Impression of participation: Pt. Refuse, chose to rest in bed despite staff encouragement

## 2015-04-14 NOTE — Behavioral Health Treatment Team (Signed)
Attended group on gratitude this shift. Grateful that he admitted himself for mental health stabilization.

## 2015-04-15 MED ORDER — CLONAZEPAM 0.5 MG TAB
0.5 mg | Freq: Three times a day (TID) | ORAL | Status: DC
Start: 2015-04-15 — End: 2015-04-16
  Administered 2015-04-16 (×3): via ORAL

## 2015-04-15 MED ORDER — MIRTAZAPINE 15 MG TAB
15 mg | Freq: Every evening | ORAL | Status: DC
Start: 2015-04-15 — End: 2015-04-16
  Administered 2015-04-16: 01:00:00 via ORAL

## 2015-04-15 MED FILL — TRAZODONE 50 MG TAB: 50 mg | ORAL | Qty: 1

## 2015-04-15 MED FILL — LAMOTRIGINE 25 MG TAB: 25 mg | ORAL | Qty: 2

## 2015-04-15 MED FILL — CLONAZEPAM 0.5 MG TAB: 0.5 mg | ORAL | Qty: 2

## 2015-04-15 MED FILL — LEVETIRACETAM 500 MG TAB: 500 mg | ORAL | Qty: 3

## 2015-04-15 MED FILL — HYDROCHLOROTHIAZIDE 25 MG TAB: 25 mg | ORAL | Qty: 1

## 2015-04-15 MED FILL — MIRTAZAPINE 15 MG TAB: 15 mg | ORAL | Qty: 1

## 2015-04-15 MED FILL — LISINOPRIL 10 MG TAB: 10 mg | ORAL | Qty: 2

## 2015-04-15 NOTE — Progress Notes (Signed)
Problem: Falls - Risk of  Goal: *Absence of falls  AEB zero falls during hospitalization.   Outcome: Progressing Towards Goal  No falls     Problem: Suicide/Homicide (Adult/Pediatric)  Goal: *STG: Attends activities and groups  AEB attending 3 groups daily.   Outcome: Not Progressing Towards Goal  Variance: Patient Condition  Comments: Not attending groups today   Goal: *STG/LTG: Complies with medication therapy  AEB taking all scheduled medications as ordered daily.   Outcome: Progressing Towards Goal  Compliant   Goal: *STG/LTG: No longer expresses self destructive or suicidal/homicidal thoughts  AEB denying SI and contracting for safety daily.   Outcome: Progressing Towards Goal  Denies     Problem: Depressed Mood (Adult/Pediatric)  Goal: *STG: Participates in 1:1 therapy sessions  AEB participating in 1:1 session with staff daily.   Outcome: Progressing Towards Goal  Participating in one to one with staff and MD    Problem: Seizure Disorder (Adult)  Goal: *STG: Remains free of seizure activity  AEB no seizure activity during hospitalization.   Outcome: Progressing Towards Goal  No seizure activity     Comments:   Pt states he is not suicidal or homicidal and verbally contracts for safety. He states he is very depressed and has nightmares. Pt stated the MD stated he was going to put him on the dose of Clonazepam that he has been on for his seizures and that he was going to prescribe him something for his nightmares. 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 He states he was also not suicidal when he was admitted but unfortunately he took an OD of Dilantin.

## 2015-04-15 NOTE — Behavioral Health Treatment Team (Cosign Needed)
GROUP THERAPY PROGRESS NOTE    [name] is participating in Recreational Therapy.     Group time: 45 minutes    Personal goal for participation: fresh air break    Goal orientation: social    Group therapy participation: active    Therapeutic interventions reviewed and discussed: Staff encouraged Pt to get off the unit and go outside and get some fresh air    Impression of participation: Pt. chose to color zing patterns while socializing with peers.

## 2015-04-15 NOTE — Behavioral Health Treatment Team (Signed)
SW Contact: Pt. Is a 29 year old male with a history of Bipolar disorder and  A history of polydrug abuse. The pt. Was admitted for prescription medication overdose ( Dilantin).  Pt. Stated he lost his mother last year and recently lost his father in June of this year. Pt. Stated he moved for [place] to [facility] to be closer to his sibling. Pt. Stated he was [facility] [place], [place] but left the  program because he did not like the staff's conduct towards him. Pt. Stated he has been dealing with bereavement. Pt. denies ideations to harm self at this time. Pt. Is contracting for safety Pt. Admits to self-medicating with a blunt a month ago but denies any drug use at this time. Pt. stated he is currently living in [place]. Pt. Stated he is interested in information on housing and Medicaid. SW will provide the pt. With a shelter list. SW dicussed positive goal setting, safety plan, positive coping skills,aftercare,  positive ways to cope with grief and losses.  SW will provide the pt. With a resource list.     SW will discuss case with the assigned psychiatrist.

## 2015-04-15 NOTE — Other (Signed)
SOCIAL WORK GROUP THERAPY PROGRESS NOTE    Group Time:  1:15pm    Group Topic:  Coping Skills    Group Participation:     Pt expressed not interested in attending session despite staff support
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
## 2015-04-15 NOTE — Other (Signed)
ART THERAPY GROUP PROGRESS NOTE    Group time:1415    The patient declined group despite encouragement.  In bed.
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
## 2015-04-15 NOTE — Other (Signed)
ACTIVITIES THERAPY PROGRESS NOTE    Group time:1530    The patient declined group.  In bed.

## 2015-04-16 MED ORDER — MIRTAZAPINE 30 MG TAB
30 mg | ORAL_TABLET | Freq: Every evening | ORAL | 0 refills | Status: DC
Start: 2015-04-16 — End: 2015-07-19

## 2015-04-16 MED ORDER — LEVETIRACETAM 750 MG TAB
750 mg | ORAL_TABLET | Freq: Two times a day (BID) | ORAL | 0 refills | Status: DC
Start: 2015-04-16 — End: 2016-01-19

## 2015-04-16 MED ORDER — HYDROXYZINE PAMOATE 50 MG CAP
50 mg | ORAL_CAPSULE | Freq: Four times a day (QID) | ORAL | 0 refills | Status: AC | PRN
Start: 2015-04-16 — End: 2015-04-30

## 2015-04-16 MED ORDER — TRAZODONE 50 MG TAB
50 mg | ORAL_TABLET | Freq: Every evening | ORAL | 0 refills | Status: DC | PRN
Start: 2015-04-16 — End: 2015-07-19

## 2015-04-16 MED ORDER — CLONAZEPAM 1 MG TAB
1 mg | ORAL_TABLET | Freq: Three times a day (TID) | ORAL | 0 refills | Status: DC
Start: 2015-04-16 — End: 2016-01-19

## 2015-04-16 MED ORDER — PRAZOSIN 1 MG CAP
1 mg | ORAL_CAPSULE | Freq: Every evening | ORAL | 0 refills | Status: DC
Start: 2015-04-16 — End: 2015-07-19

## 2015-04-16 MED ORDER — PRAZOSIN 1 MG CAP
1 mg | Freq: Every evening | ORAL | Status: DC
Start: 2015-04-16 — End: 2015-04-16

## 2015-04-16 MED FILL — CLONAZEPAM 0.5 MG TAB: 0.5 mg | ORAL | Qty: 2

## 2015-04-16 MED FILL — HYDROCHLOROTHIAZIDE 25 MG TAB: 25 mg | ORAL | Qty: 1

## 2015-04-16 MED FILL — LEVETIRACETAM 500 MG TAB: 500 mg | ORAL | Qty: 3

## 2015-04-16 MED FILL — HALOPERIDOL 5 MG TAB: 5 mg | ORAL | Qty: 1

## 2015-04-16 MED FILL — MIRTAZAPINE 15 MG TAB: 15 mg | ORAL | Qty: 2

## 2015-04-16 MED FILL — LAMOTRIGINE 25 MG TAB: 25 mg | ORAL | Qty: 2

## 2015-04-16 MED FILL — LISINOPRIL 20 MG TAB: 20 mg | ORAL | Qty: 1

## 2015-04-16 NOTE — Behavioral Health Treatment Team (Incomplete)
Pt in room most of the shift declining to to take part in any thing but did sit briefly in group this afternoon.  He offers no disclosure stating his plan was to rest today.Was seen by social worker this afternoon.Encouraged   working on treatment while here.Wear non skid socks

## 2015-04-16 NOTE — Behavioral Health Treatment Team (Signed)
Patient refused to wait for paper work to be completed. Was agitated and stated he wants to leave because they are delaying him
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
## 2015-04-16 NOTE — Behavioral Health Treatment Team (Signed)
SW discussed case with the treating psychiatrist      SW Contact: Pt. Is a 29 year old male with a history of Bipolar disorder and A history of polydrug abuse. The pt. Was admitted for prescription medication overdose ( Dilantin).  SW met with pt. Along  with the treating psychiatrist.  Pt. Denies ideations and hallucinations. Pt. Stated he did not have an intention overdose.pt. Stated the medications he was prescribed caused him to have a medical emergency.  Pt. Stated he took the medications as prescribed. The treating psychiatrist addressed the medication issue.  Pt. denies ideations and hallucinations. Pt. Stated he is feeling o.k today. Pt. Has been resting for most of the day in his room. Pt. Was encourage to socialize.  Pt. Was provided with  Tourist information centre manager.

## 2015-04-16 NOTE — Other (Signed)
SOCIAL WORK GROUP THERAPY PROGRESS NOTE    Group Time:     1:15pm    Group Topic:  Coping Skills    C D Issues    Group Participation:    .  Pt minimally involved during group discussion. He sat at group table, did not fill out handouts & no self disclosure. He only read one paragraph aloud on "letting go".     Focus was on handout of the role of "neurotransmitters" and how they regulate different aspects of one's moods, cognitions & related behavior.

## 2015-04-16 NOTE — Behavioral Health Treatment Team (Cosign Needed)
Pt chose to sleep rather than participate in activity on unit.He said was just going to get all the sleep he could.

## 2015-04-18 ENCOUNTER — Inpatient Hospital Stay
Admit: 2015-04-18 | Discharge: 2015-04-22 | Disposition: A | Discharge status: Home / Self Care | Payer: MEDICARE | Attending: Psychiatry | Admitting: Psychiatry

## 2015-04-18 DIAGNOSIS — F339 Major depressive disorder, recurrent, unspecified: Secondary | ICD-10-CM

## 2015-04-18 MED ORDER — LEVETIRACETAM 500 MG TAB
500 mg | Freq: Two times a day (BID) | ORAL | Status: DC
Start: 2015-04-18 — End: 2015-04-22
  Administered 2015-04-19 – 2015-04-22 (×8): via ORAL

## 2015-04-18 MED ORDER — LAMOTRIGINE 25 MG TAB
25 mg | Freq: Two times a day (BID) | ORAL | Status: DC
Start: 2015-04-18 — End: 2015-04-22
  Administered 2015-04-19 – 2015-04-22 (×8): via ORAL

## 2015-04-18 MED ORDER — HALOPERIDOL 5 MG TAB
5 mg | ORAL | Status: DC | PRN
Start: 2015-04-18 — End: 2015-04-22
  Administered 2015-04-19 – 2015-04-20 (×2): via ORAL

## 2015-04-18 MED ORDER — HALOPERIDOL LACTATE 5 MG/ML IJ SOLN
5 mg/mL | INTRAMUSCULAR | Status: DC | PRN
Start: 2015-04-18 — End: 2015-04-22

## 2015-04-18 MED ORDER — BENZTROPINE 1 MG/ML IJ SOLN
1 mg/mL | INTRAMUSCULAR | Status: DC | PRN
Start: 2015-04-18 — End: 2015-04-22

## 2015-04-18 MED ORDER — LORAZEPAM 1 MG TAB
1 mg | ORAL | Status: DC | PRN
Start: 2015-04-18 — End: 2015-04-22
  Administered 2015-04-19 – 2015-04-22 (×4): via ORAL

## 2015-04-18 MED ORDER — LISINOPRIL 20 MG TAB
20 mg | Freq: Every day | ORAL | Status: DC
Start: 2015-04-18 — End: 2015-04-22
  Administered 2015-04-19 – 2015-04-22 (×4): via ORAL

## 2015-04-18 MED ORDER — ACETAMINOPHEN 325 MG TABLET
325 mg | ORAL | Status: DC | PRN
Start: 2015-04-18 — End: 2015-04-22

## 2015-04-18 MED ORDER — TRAZODONE 50 MG TAB
50 mg | Freq: Every evening | ORAL | Status: DC | PRN
Start: 2015-04-18 — End: 2015-04-22

## 2015-04-18 MED ORDER — LORAZEPAM 2 MG/ML IJ SOLN
2 mg/mL | INTRAMUSCULAR | Status: DC | PRN
Start: 2015-04-18 — End: 2015-04-22

## 2015-04-18 MED ORDER — MIRTAZAPINE 15 MG TAB
15 mg | Freq: Every evening | ORAL | Status: DC
Start: 2015-04-18 — End: 2015-04-22
  Administered 2015-04-19 – 2015-04-22 (×4): via ORAL

## 2015-04-18 MED ORDER — CLONAZEPAM 0.5 MG TAB
0.5 mg | Freq: Three times a day (TID) | ORAL | Status: DC
Start: 2015-04-18 — End: 2015-04-22
  Administered 2015-04-19 – 2015-04-22 (×11): via ORAL

## 2015-04-18 MED ORDER — HYDROCHLOROTHIAZIDE 25 MG TAB
25 mg | Freq: Every day | ORAL | Status: DC
Start: 2015-04-18 — End: 2015-04-22
  Administered 2015-04-19 – 2015-04-22 (×4): via ORAL

## 2015-04-18 MED ORDER — BENZTROPINE 1 MG TAB
1 mg | ORAL | Status: DC | PRN
Start: 2015-04-18 — End: 2015-04-22

## 2015-04-18 NOTE — Behavioral Health Treatment Team (Signed)
Upon arrival to unit patient was very hostile, oppositional, and angry stating he is not spending a night in this place because he does not get the treatment he needs and this will affect his insurance. States he is going to call medicare and hospital administrator to be discharge tonight. Patient was informed he is a TDO and can not be discharge tonight. Patient states he would like to go to [facility] or [address] any place except here where he can get the treatment he needs. In addition to complaining about his treatment he also stated he did not feel safe being here. Patient states he is suppose to have medicine to prevent him from having nightmare. Patient approached nurses station every five minutes requesting to be discharge and was reminded that he is a TDO. patient stated he does not care about any TDO because he did not do anything to have him admitted. Patient walked from station and called me a derogatory name. Patient was offered dinner in which he ate 100%. Patient was encouraged to relax and get some rest.Patient is sleeping at this time.

## 2015-04-18 NOTE — Behavioral Health Treatment Team (Signed)
Pt. came on unit upset because he is here. Pt refused to have vitals weight and height completed. Pt also stated "my cell phone was left where I just came from".

## 2015-04-19 MED FILL — LEVETIRACETAM 500 MG TAB: 500 mg | ORAL | Qty: 3

## 2015-04-19 MED FILL — CLONAZEPAM 0.5 MG TAB: 0.5 mg | ORAL | Qty: 2

## 2015-04-19 MED FILL — HALOPERIDOL 5 MG TAB: 5 mg | ORAL | Qty: 1

## 2015-04-19 MED FILL — MIRTAZAPINE 15 MG TAB: 15 mg | ORAL | Qty: 2

## 2015-04-19 MED FILL — LISINOPRIL 20 MG TAB: 20 mg | ORAL | Qty: 1

## 2015-04-19 MED FILL — LORAZEPAM 1 MG TAB: 1 mg | ORAL | Qty: 2

## 2015-04-19 MED FILL — LAMOTRIGINE 25 MG TAB: 25 mg | ORAL | Qty: 2

## 2015-04-19 MED FILL — HYDROCHLOROTHIAZIDE 25 MG TAB: 25 mg | ORAL | Qty: 1

## 2015-04-19 NOTE — Behavioral Health Treatment Team (Cosign Needed Addendum)
M.H.T. Note: The above pt was given a extra try but due to having an allergy to fish he will be getting a [place] sandwich and he was informed of this information and he was receptive of this information. The cafeteria was alson notified of his double portions by staff and also put in the computer by a nurse during this shift.

## 2015-04-19 NOTE — Behavioral Health Treatment Team (Cosign Needed)
Group Note: The above pt participated in community group this shift but did not have any concerns during group. He was receptive to the rules on the unit.

## 2015-04-19 NOTE — Behavioral Health Treatment Team (Signed)
Patient was calm and quiet during the shift, dit not participate in any group/activities. After his dinner patient went back to bed and only came back to say he wanted his overdue medications. After patient took his Klonopin he went back to sleep. Patient came back to day room ate some sand witches, took his evening medication and went bact to sleep. Will continue to monitor.
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
## 2015-04-19 NOTE — Behavioral Health Treatment Team (Cosign Needed)
M.H.T. Note: The above pt was transferred from room 106-3 to 102-1.

## 2015-04-19 NOTE — Other (Signed)
ACTIVITIES THERAPY PROGRESS NOTE    Group time:1445    The patient did not awaken/get up when called for group.  Marland Kitchen

## 2015-04-19 NOTE — Behavioral Health Treatment Team (Signed)
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

## 2015-04-19 NOTE — Other (Signed)
SW SESSION: SW met with patient to discuss admission. Patient stated he wanted to meet with the doctor because he was placed on an "unnecessary TDO" and is aware the doctor can "override" it. Patient stated he went to the ER because of seizures however stated the nurses became confused and accused him of "OD" on his medication. SW explained the reports given on the TDO and patient denies telling anyone that he OD on his lisinopril or clonazepam. Patient stated, "I am in sound mind. I am not suicidal nor homicidal. I do not want to harm myself nor others. I would like to be discharged today". SW informed patient that this information will be passed on the doctor however due to the reports on OD it is more likely that patient will remain admitted until his hearing on Monday.  Patient denies any illicit drug activity. Patient stated he had plans on visiting his family in [place] this weekend. As of today patient is adamant he is not suicidal and did not attempt to OD on medication. Upon discharge pateitn will return back to his home at [place] [address].     SW NOTE: SW and Midwife met with patient to discuss an incident with nursing student that was reported by the patient this morning. According to the patient the nursing student did not identify the paper tool she was using as a "mini mental health status exam" and stated he felt "violated". Patient stated he is educated with a Copywriter, advertising and works as an Health visitor and felt the nursing student was conducting a psych exam without his permission. The paper tool was explained to him by the Nurse Manager and we apologized for the misunderstanding. The nursing student was removed off the until as requested by the patient. Patient continued to expressed that he would like to be discharged due to no SI/HI and "held here for no reason". SW explained again to patient that the doctor is aware that he would like to  meet. Patient was informed that the doctor will meet with him today to discuss this new admission.

## 2015-04-19 NOTE — Progress Notes (Signed)
Problem: Falls - Risk of  Goal: *Knowledge of fall prevention  Patient will express two ways to keep from falling daily while in hospital   Outcome: Progressing Towards Goal  Patient is progressing as evidence by consistently wearing non-skid footwear while ambulating.     Problem: Suicide/Homicide (Adult/Pediatric)  Goal: *STG: Attends activities and groups  Patient will be an active participant daily while in hospital   Outcome: Progressing Towards Goal  Patient is progressing as evidence by attending 2 groups during this shift. Patient did not attend nursing group and was allowed to rest due to "nodding off" during 1:1 with this nurse.    Comments:   Patient has voiced "irritation and frustration" over TDO. Patient has been at nursing desk many times stating "I just need to stand here for a minute because I feel like I'm going to freak out."  Patient was reassured that this nurse is available when he feels like he's going to "freak out." patient agreed to above.

## 2015-04-19 NOTE — Behavioral Health Treatment Team (Cosign Needed)
[name] is not participating in Recreational Therapy.     Group time: 1 hour    Personal goal for participation: comedy movie    Goal orientation: passive    Group therapy participation: passive    Therapeutic interventions reviewed and discussed: Staff encouraged Pt. To participate in group    Impression of participation: Pt refuse, chose to rest in bed, despite staff encouragement

## 2015-04-19 NOTE — Behavioral Health Treatment Team (Signed)
Patient continues to demonstrate staff splitting and requesting to get discharged patient made numerous complaints about the nursing student that was assigned to him and requested her nursing  instructor to move her off the floor will continue to monitor.
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
## 2015-04-20 MED FILL — CLONAZEPAM 0.5 MG TAB: 0.5 mg | ORAL | Qty: 2

## 2015-04-20 MED FILL — LEVETIRACETAM 500 MG TAB: 500 mg | ORAL | Qty: 3

## 2015-04-20 MED FILL — LAMOTRIGINE 25 MG TAB: 25 mg | ORAL | Qty: 2

## 2015-04-20 MED FILL — LISINOPRIL 20 MG TAB: 20 mg | ORAL | Qty: 1

## 2015-04-20 MED FILL — HYDROCHLOROTHIAZIDE 25 MG TAB: 25 mg | ORAL | Qty: 1

## 2015-04-20 NOTE — Progress Notes (Signed)
Behavioral Health Progress Note      04/20/2015    [name]    Current Diagnosis: Major depressive disorder, recurrent, generalized anxiety disorder,  posttraumatic stress disorder.    Report from the nursing staff changes in the medical condition while patient has been on the unit:    Patient Vitals for the past 24 hrs:   Temp Pulse Resp BP   04/20/15 0819 (!) 45.3 ??F (7.4 ??C) 78 20 121/72   04/19/15 2110 97.5 ??F (36.4 ??C) 60 18 101/57       No results found for this or any previous visit (from the past 24 hour(s)).    Sleep:shows no evidence of impairment    Interval History: Patient reports feeling OK and less depressed. He denies A/V/H and H/S/I/P. He is meds compliant w/o SE reported or observed. Per RN, patient is manipulative and staffs splitting. Patient ate and slept well yesterday. NO PRN medications necessary for anxiety, agitation, or insomnia overnight. Patient is NOT attending group sessions or participating in unit activities.    Mental Status Exam: Unremarkable except: affect/mood are less depress. Insight/judgment limited.     Treatment Plan: Continue current treatment regimen  1. Clonazepam 1 mg PO TID  2. Lamictal 150 mg PO BID  3. Remeron 30 mg PO qHS    Anticipated Discharge: To be determined.     Henreitta Leber, MD  04/20/2015

## 2015-04-20 NOTE — Progress Notes (Signed)
Problem: Suicide/Homicide (Adult/Pediatric)  Goal: *STG: Attends activities and groups  Patient will be an active participant daily while in hospital   Outcome: Not Progressing Towards Goal  Variance: Patient Condition  Pt refusing groups  Goal: *Complies with medication therapy  AEB taking all scheduled medications as ordered daily.  Outcome: Progressing Towards Goal  Compliant with medictions  Goal: *STG/LTG: NO LONGER EXPRESSES SELF DESTRUCTIVE OR SUICIDAL/HOMICIDAL THOUGHTS  AEB denying SI and contracting for safety daily.  Outcome: Progressing Towards Goal  Denies SI and contracts for safety.    Comments:   Pt denies SI and contracts for safety.  Compliant with medications.  Non-compliant with group.  Pt is isolative staying in bed.  States, "I am not sleeping well.  I sleep a little then I can't sleep."  Pt encouraged to get up out of bed and interact with peers during the day and try to get a good nights sleep after being up all day.  Pt agrees then stays in bed.  Will continue to monitor for safety as ordered.

## 2015-04-20 NOTE — Behavioral Health Treatment Team (Cosign Needed)
Patient remained in bed most of the shift. Patient came out for meals and medications only.Patient involved in no falls this shift, Skid proof footwear utilized. Patient is safe on the unit.

## 2015-04-20 NOTE — Behavioral Health Treatment Team (Signed)
[name] did not attend nursing group.  Pt was notified of group time and encouraged to attend.  Pt refused and remained in bed.

## 2015-04-20 NOTE — Other (Signed)
Client approached supervisor, "Now I'm not one to complain, but I'm supposed to get double portions of food and the people without double portions, got bigger helpings than me. I am on all these meds that increase my appetite. I felt like the staff kind of blew me off saying they had snacks for later and would be sure I got them. I really wanted another tray."  Discussed with charge nurse. Order checked for clarification. Extra tray obtained for client by John,RN.

## 2015-04-21 MED FILL — LEVETIRACETAM 500 MG TAB: 500 mg | ORAL | Qty: 3

## 2015-04-21 MED FILL — LAMOTRIGINE 25 MG TAB: 25 mg | ORAL | Qty: 2

## 2015-04-21 MED FILL — CLONAZEPAM 0.5 MG TAB: 0.5 mg | ORAL | Qty: 2

## 2015-04-21 MED FILL — LISINOPRIL 20 MG TAB: 20 mg | ORAL | Qty: 1

## 2015-04-21 MED FILL — MIRTAZAPINE 15 MG TAB: 15 mg | ORAL | Qty: 2

## 2015-04-21 MED FILL — LORAZEPAM 1 MG TAB: 1 mg | ORAL | Qty: 1

## 2015-04-21 MED FILL — HYDROCHLOROTHIAZIDE 25 MG TAB: 25 mg | ORAL | Qty: 1

## 2015-04-21 NOTE — Behavioral Health Treatment Team (Cosign Needed)
The patient spent most of the shift in his room isolated.  He was encouraged to participate in activities; however, he declined. He was med compliant and ate his breakfast and lunch.

## 2015-04-21 NOTE — Progress Notes (Signed)
Problem: Falls - Risk of  Goal: *Absence of falls  Patient will keep area clutter free daily while in hospital   Outcome: Progressing Towards Goal  No falls in past 24 hours.    Problem: Suicide/Homicide (Adult/Pediatric)  Goal: *STG: Attends activities and groups  Patient will be an active participant daily while in hospital   Outcome: Not Progressing Towards Goal  Not attending groups and activities.  Goal: *Complies with medication therapy  AEB taking all scheduled medications as ordered daily.   Outcome: Progressing Towards Goal  Taking all scheduled medications.    Comments:   Mood depressed, continues to isolate in room to avoid stimulation on unit which patient finds stressful. Comes out for meals, takes medication without problems. Denies thoughts of harm to self or others. Denies hallucinations.

## 2015-04-21 NOTE — Progress Notes (Signed)
Behavioral Health Progress Note      04/21/2015    Shela Commons    Current Diagnosis: Major depressive disorder, recurrent, generalized anxiety disorder,  posttraumatic stress disorder    Report from the nursing staff changes in the medical condition while patient has been on the unit:    Patient Vitals for the past 24 hrs:   Temp Pulse Resp BP   04/21/15 0815 97.6 ??F (36.4 ??C) 64 18 106/64       No results found for this or any previous visit (from the past 24 hour(s)).    Sleep:shows no evidence of impairment    Interval History: Patient has been isolative in his room most of the day. Patient said he like to stay in his room because it's not as loud as the day room. He said that he has a TBI and can't stand loud noise. Patient has been compliant w/ meds and w/ meals w/o any SE reported or observed. Patient reports feeling better but his affect is sad. He denies A/V/H and H/S/I/P. NO PRN medication was necessary for anxiety/agitation/insomnia overnight. Patient ate and slept adequately yesterday.     Mental Status Exam: Unremarkable except affect/mood still dysthymic. Vulnerable/Fragile.     Treatment Plan: Continue current treatment regimen  1. Clonazepam 1 mg PO TID  2. Lamictal 150 mg PO BID  3. Remeron 30 mg PO qHS  ??    Anticipated Discharge: To be determined.     Henreitta Leber, MD  04/21/2015

## 2015-04-21 NOTE — Behavioral Health Treatment Team (Signed)
GROUP THERAPY PROGRESS NOTE    [name] did not attend Recreation Group lasting 60 minutes, stating he was unable to tolerate stimulation in milieu.

## 2015-04-22 MED FILL — LORAZEPAM 1 MG TAB: 1 mg | ORAL | Qty: 1

## 2015-04-22 MED FILL — CLONAZEPAM 0.5 MG TAB: 0.5 mg | ORAL | Qty: 2

## 2015-04-22 MED FILL — HYDROCHLOROTHIAZIDE 25 MG TAB: 25 mg | ORAL | Qty: 1

## 2015-04-22 MED FILL — LAMOTRIGINE 25 MG TAB: 25 mg | ORAL | Qty: 2

## 2015-04-22 MED FILL — LISINOPRIL 20 MG TAB: 20 mg | ORAL | Qty: 1

## 2015-04-22 MED FILL — LEVETIRACETAM 500 MG TAB: 500 mg | ORAL | Qty: 3

## 2015-04-22 MED FILL — MIRTAZAPINE 15 MG TAB: 15 mg | ORAL | Qty: 2
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
## 2015-04-22 NOTE — Other (Signed)
Patient sitting in dayroom, reported feeling like a seizure was coming on, and requested Ativan. Patient reported last seizure activity a week ago. Vital signs taken, within normal limits. Patient given Ativan 1 mg po. Patient sitting in dayroom advised to notify staff if he continues to feel the potential for a seizure. Will continue to monitor and evaluate for effectiveness of medication.

## 2015-04-22 NOTE — Behavioral Health Treatment Team (Signed)
Patient has been cooperative with brighter affect than last shift with this nurse. Patient has all personal belongings and has signed form. Patient denies thoughts of self harm or harm to others at this time. Patient received copy of discharge papers with follow up information. Patient did not require prescriptions due to receiving prescriptions last week. Patient received bus pass for transportation home. Patient escorted to reception by Child psychotherapist.   Patient alert x3 and ambulatory. Patient armband taken and shredded.
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
## 2015-04-22 NOTE — Behavioral Health Treatment Team (Signed)
Group Note: The above pt participated in group and not a management problem during group.
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
## 2015-04-23 ENCOUNTER — Inpatient Hospital Stay
Admit: 2015-04-23 | Discharge: 2015-04-23 | Disposition: A | Discharge status: Home / Self Care | Payer: MEDICARE | Attending: Emergency Medicine

## 2015-04-23 ENCOUNTER — Emergency Department: Admit: 2015-04-23 | Payer: MEDICARE

## 2015-04-23 DIAGNOSIS — J189 Pneumonia, unspecified organism: Secondary | ICD-10-CM

## 2015-04-23 LAB — METABOLIC PANEL, BASIC
Anion gap: 8 mmol/L (ref 3.0–18)
BUN/Creatinine ratio: 20 (ref 12–20)
BUN: 18 MG/DL (ref 7.0–18)
CO2: 29 mmol/L (ref 21–32)
Calcium: 9.2 MG/DL (ref 8.5–10.1)
Chloride: 102 mmol/L (ref 100–108)
Creatinine: 0.91 MG/DL (ref 0.6–1.3)
GFR est AA: >60 mL/min/{1.73_m2} (ref >60)
GFR est non-AA: >60 mL/min/{1.73_m2} (ref >60)
Glucose: 95 mg/dL (ref 74–99)
Potassium: 4 mmol/L (ref 3.5–5.5)
Sodium: 139 mmol/L (ref 136–145)

## 2015-04-23 LAB — CBC WITH AUTOMATED DIFF
ABS. BASOPHILS: 0 10*3/uL (ref 0.0–0.06)
ABS. EOSINOPHILS: 0.1 10*3/uL (ref 0.0–0.4)
ABS. LYMPHOCYTES: 2.1 10*3/uL (ref 0.9–3.6)
ABS. MONOCYTES: 0.9 10*3/uL (ref 0.05–1.2)
ABS. NEUTROPHILS: 10.9 10*3/uL — ABNORMAL HIGH (ref 1.8–8.0)
BASOPHILS: 0 % (ref 0–2)
EOSINOPHILS: 1 % (ref 0–5)
HCT: 42.9 % (ref 36.0–48.0)
HGB: 14.8 g/dL (ref 13.0–16.0)
LYMPHOCYTES: 15 % — ABNORMAL LOW (ref 21–52)
MCH: 30.8 PG (ref 24.0–34.0)
MCHC: 34.5 g/dL (ref 31.0–37.0)
MCV: 89.4 FL (ref 74.0–97.0)
MONOCYTES: 7 % (ref 3–10)
MPV: 10.4 FL (ref 9.2–11.8)
NEUTROPHILS: 77 % — ABNORMAL HIGH (ref 40–73)
PLATELET: 208 10*3/uL (ref 135–420)
RBC: 4.8 M/uL (ref 4.70–5.50)
RDW: 12.5 % (ref 11.6–14.5)
WBC: 14 10*3/uL — ABNORMAL HIGH (ref 4.6–13.2)

## 2015-04-23 LAB — PHENYTOIN: Phenytoin: 4.5 ug/mL — ABNORMAL LOW (ref 10.0–20.0)

## 2015-04-23 LAB — MAGNESIUM: Magnesium: 2 mg/dL (ref 1.8–2.4)

## 2015-04-23 MED ORDER — AZITHROMYCIN 250 MG TAB
250 mg | ORAL_TABLET | ORAL | 0 refills | Status: DC
Start: 2015-04-23 — End: 2015-07-19

## 2015-04-23 MED ORDER — LORAZEPAM 2 MG/ML IJ SOLN
2 mg/mL | Freq: Once | INTRAMUSCULAR | Status: AC
Start: 2015-04-23 — End: 2015-04-23
  Administered 2015-04-23: 13:00:00 via INTRAVENOUS

## 2015-04-23 MED ORDER — SODIUM CHLORIDE 0.9% BOLUS IV
0.9 % | Freq: Once | INTRAVENOUS | Status: AC
Start: 2015-04-23 — End: 2015-04-23
  Administered 2015-04-23: 13:00:00 via INTRAVENOUS

## 2015-04-23 MED ORDER — ACETAMINOPHEN 500 MG TAB
500 mg | ORAL | Status: AC
Start: 2015-04-23 — End: 2015-04-23
  Administered 2015-04-23: 13:00:00 via ORAL

## 2015-04-23 MED ORDER — PHENYTOIN SODIUM 50 MG/ML IV
50 mg/mL | INTRAVENOUS | Status: AC
Start: 2015-04-23 — End: 2015-04-23
  Administered 2015-04-23: 14:00:00 via INTRAVENOUS

## 2015-04-23 MED FILL — PHENYTOIN SODIUM 50 MG/ML IV: 50 mg/mL | INTRAVENOUS | Qty: 16

## 2015-04-23 MED FILL — LORAZEPAM 2 MG/ML IJ SOLN: 2 mg/mL | INTRAMUSCULAR | Qty: 1

## 2015-04-23 MED FILL — MAPAP EXTRA STRENGTH 500 MG TABLET: 500 mg | ORAL | Qty: 2

## 2015-04-23 MED FILL — SODIUM CHLORIDE 0.9 % IV: INTRAVENOUS | Qty: 1000

## 2015-04-23 NOTE — ED Notes (Signed)
Purposeful rounding completed:    Side rails up x 2:  YES  Bed in low position and wheels locked: YES  Call bell within reach: YES  Comfort addressed: YES    Toileting needs addressed: YES  Plan of care reviewed/updated with patient and or family members: YES  IV site assessed: YES  Pain assessed and addressed: YES

## 2015-04-23 NOTE — ED Notes (Signed)
IV attempt x2, unsuccessful.

## 2015-04-23 NOTE — ED Notes (Signed)
I have reviewed discharge instructions with the patient.  The patient verbalized understanding.  Patient armband removed and given to patient to take home.  Patient was informed of the privacy risks if armband lost or stolen.  Pt alert, oriented x4 and ambulatory out of ER in NAD at this time. VSS.

## 2015-04-23 NOTE — ED Notes (Addendum)
Bus pass provided per patient request. Awaiting Dilantin to finish infusing then patient with be discharged home.    Pt was provided with ginger ale and graham crackers per request. No new complaints or concerns at this time.

## 2015-04-23 NOTE — ED Triage Notes (Signed)
H/O Seizure disorder, reports that he had 3 seizures since 0200 witnessed by room mate. Reports incontinence during the  Seizure.   headache and bodyaches.
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
## 2015-04-23 NOTE — Progress Notes (Signed)
Chaplain completed the initial Spiritual Assessment of the patient in bed 3 of the emergency  room, and offered Pastoral Care support.  Patient requested help in getting a clean pair of pants as he had soiled himself during a seizure event that he had.  Was able to go to the clothes closet and locate some clothes for him to be discharged in.  Patient does not have any religious/cultural needs that will affect patient???s preferences in health care.  Chaplains will continue to follow and will provide pastoral care on an as needed/requested basis     Chaplain Environmental manager   Spiritual Care   [phone]

## 2015-07-19 DIAGNOSIS — T420X1A Poisoning by hydantoin derivatives, accidental (unintentional), initial encounter: Secondary | ICD-10-CM

## 2015-07-19 NOTE — ED Notes (Signed)
Patient states he is still dizzy. Patient has been on the phone for the past hour.

## 2015-07-19 NOTE — ED Notes (Signed)
[place]  [facility]      29 y.o. male with noted past medical history who presents to the emergency department with dizziness, whole body tingling, and nausea.  Difficulty speaking some words.      I performed a brief evaluation, including history and physical, of the patient here in triage and I have determined that pt will need further treatment and evaluation from the main side ER physician. I have placed initial orders to help in expediting patients care.     Arlys JohnBrian L. Westley Footsubenstein, M.D.  [facility] Board Certified Emergency Physician
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
## 2015-07-19 NOTE — ED Triage Notes (Signed)
"  dizzy, lightheaded, speech is going in and out, my whole body is tingling"  Symptoms started 30 minutes ago.

## 2015-07-19 NOTE — ED Notes (Signed)
Urine specimen sent to lab.
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
## 2015-07-20 ENCOUNTER — Inpatient Hospital Stay
Admit: 2015-07-20 | Discharge: 2015-07-20 | Disposition: A | Discharge status: Home / Self Care | Payer: MEDICARE | Attending: Emergency Medicine

## 2015-07-20 LAB — DRUG SCREEN, URINE
AMPHETAMINES: NEGATIVE
BARBITURATES: NEGATIVE
BENZODIAZEPINES: NEGATIVE
COCAINE: NEGATIVE
METHADONE: NEGATIVE
OPIATES: NEGATIVE
PCP(PHENCYCLIDINE): NEGATIVE
THC (TH-CANNABINOL): NEGATIVE

## 2015-07-20 LAB — URINALYSIS W/ RFLX MICROSCOPIC
Bilirubin: NEGATIVE
Blood: NEGATIVE
Glucose: NEGATIVE mg/dL
Ketone: NEGATIVE mg/dL
Leukocyte Esterase: NEGATIVE
Nitrites: NEGATIVE
Protein: NEGATIVE mg/dL
Specific gravity: 1.018 (ref 1.005–1.030)
Urobilinogen: 1 EU/dL (ref 0.2–1.0)
pH (UA): 6 (ref 5.0–8.0)

## 2015-07-20 LAB — METABOLIC PANEL, COMPREHENSIVE
A-G Ratio: 1.4 (ref 0.8–1.7)
A-G Ratio: 1.3 (ref 0.8–1.7)
ALT (SGPT): 31 U/L (ref 16–61)
ALT (SGPT): 30 U/L (ref 16–61)
AST (SGOT): 21 U/L (ref 15–37)
AST (SGOT): 18 U/L (ref 15–37)
Albumin: 4.3 g/dL (ref 3.4–5.0)
Albumin: 3.7 g/dL (ref 3.4–5.0)
Alk. phosphatase: 106 U/L (ref 45–117)
Alk. phosphatase: 98 U/L (ref 45–117)
Anion gap: 9 mmol/L (ref 3.0–18)
Anion gap: 9 mmol/L (ref 3.0–18)
BUN/Creatinine ratio: 13 (ref 12–20)
BUN/Creatinine ratio: 14 (ref 12–20)
BUN: 12 MG/DL (ref 7.0–18)
BUN: 11 MG/DL (ref 7.0–18)
Bilirubin, total: 0.4 MG/DL (ref 0.2–1.0)
Bilirubin, total: 0.4 MG/DL (ref 0.2–1.0)
CO2: 28 mmol/L (ref 21–32)
CO2: 28 mmol/L (ref 21–32)
Calcium: 9.1 MG/DL (ref 8.5–10.1)
Calcium: 9.3 MG/DL (ref 8.5–10.1)
Chloride: 105 mmol/L (ref 100–108)
Chloride: 106 mmol/L (ref 100–108)
Creatinine: 0.9 MG/DL (ref 0.6–1.3)
Creatinine: 0.78 MG/DL (ref 0.6–1.3)
GFR est AA: >60 mL/min/{1.73_m2} (ref >60)
GFR est AA: >60 mL/min/{1.73_m2} (ref >60)
GFR est non-AA: >60 mL/min/{1.73_m2} (ref >60)
GFR est non-AA: >60 mL/min/{1.73_m2} (ref >60)
Globulin: 3 g/dL (ref 2.0–4.0)
Globulin: 2.9 g/dL (ref 2.0–4.0)
Glucose: 127 mg/dL — ABNORMAL HIGH (ref 74–99)
Glucose: 101 mg/dL — ABNORMAL HIGH (ref 74–99)
Potassium: 3.5 mmol/L (ref 3.5–5.5)
Potassium: 3.8 mmol/L (ref 3.5–5.5)
Protein, total: 7.3 g/dL (ref 6.4–8.2)
Protein, total: 6.6 g/dL (ref 6.4–8.2)
Sodium: 142 mmol/L (ref 136–145)
Sodium: 143 mmol/L (ref 136–145)

## 2015-07-20 LAB — CBC WITH AUTOMATED DIFF
ABS. BASOPHILS: 0 10*3/uL (ref 0.0–0.06)
ABS. BASOPHILS: 0 10*3/uL (ref 0.0–0.06)
ABS. EOSINOPHILS: 0.1 10*3/uL (ref 0.0–0.4)
ABS. EOSINOPHILS: 0.2 10*3/uL (ref 0.0–0.4)
ABS. LYMPHOCYTES: 2 10*3/uL (ref 0.9–3.6)
ABS. LYMPHOCYTES: 3.1 10*3/uL (ref 0.9–3.6)
ABS. MONOCYTES: 0.6 10*3/uL (ref 0.05–1.2)
ABS. MONOCYTES: 0.8 10*3/uL (ref 0.05–1.2)
ABS. NEUTROPHILS: 7.6 10*3/uL (ref 1.8–8.0)
ABS. NEUTROPHILS: 5.2 10*3/uL (ref 1.8–8.0)
BASOPHILS: 0 % (ref 0–2)
BASOPHILS: 0 % (ref 0–2)
EOSINOPHILS: 1 % (ref 0–5)
EOSINOPHILS: 3 % (ref 0–5)
HCT: 44.4 % (ref 36.0–48.0)
HCT: 43.4 % (ref 36.0–48.0)
HGB: 15.2 g/dL (ref 13.0–16.0)
HGB: 14.3 g/dL (ref 13.0–16.0)
LYMPHOCYTES: 19 % — ABNORMAL LOW (ref 21–52)
LYMPHOCYTES: 33 % (ref 21–52)
MCH: 30 PG (ref 24.0–34.0)
MCH: 29.4 PG (ref 24.0–34.0)
MCHC: 34.2 g/dL (ref 31.0–37.0)
MCHC: 32.9 g/dL (ref 31.0–37.0)
MCV: 87.6 FL (ref 74.0–97.0)
MCV: 89.3 FL (ref 74.0–97.0)
MONOCYTES: 6 % (ref 3–10)
MONOCYTES: 8 % (ref 3–10)
MPV: 10.7 FL (ref 9.2–11.8)
MPV: 11 FL (ref 9.2–11.8)
NEUTROPHILS: 74 % — ABNORMAL HIGH (ref 40–73)
NEUTROPHILS: 56 % (ref 40–73)
PLATELET: 211 10*3/uL (ref 135–420)
PLATELET: 209 10*3/uL (ref 135–420)
RBC: 5.07 M/uL (ref 4.70–5.50)
RBC: 4.86 M/uL (ref 4.70–5.50)
RDW: 13.4 % (ref 11.6–14.5)
RDW: 13.5 % (ref 11.6–14.5)
WBC: 10.4 10*3/uL (ref 4.6–13.2)
WBC: 9.4 10*3/uL (ref 4.6–13.2)

## 2015-07-20 LAB — PHENYTOIN
Phenytoin: 31.2 ug/mL — ABNORMAL HIGH (ref 10.0–20.0)
Phenytoin: 33 ug/mL — ABNORMAL HIGH (ref 10.0–20.0)
Phenytoin: 33.6 ug/mL — ABNORMAL HIGH (ref 10.0–20.0)
Phenytoin: 34.1 ug/mL — ABNORMAL HIGH (ref 10.0–20.0)
Phenytoin: 34.5 ug/mL — ABNORMAL HIGH (ref 10.0–20.0)

## 2015-07-20 LAB — MAGNESIUM: Magnesium: 2 mg/dL (ref 1.8–2.4)

## 2015-07-20 LAB — ETHYL ALCOHOL: ALCOHOL(ETHYL),SERUM: <3 MG/DL (ref 0–3)

## 2015-07-20 MED ORDER — LOVASTATIN 20 MG TAB
20 mg | Freq: Every evening | ORAL | Status: DC
Start: 2015-07-20 — End: 2015-07-22
  Administered 2015-07-20 – 2015-07-21 (×2): via ORAL

## 2015-07-20 MED ORDER — SODIUM CHLORIDE 0.9% BOLUS IV
0.9 % | Freq: Once | INTRAVENOUS | Status: AC
Start: 2015-07-20 — End: 2015-07-19
  Administered 2015-07-20: 02:00:00 via INTRAVENOUS

## 2015-07-20 MED ORDER — SODIUM CHLORIDE 0.9 % IV
INTRAVENOUS | Status: DC
Start: 2015-07-20 — End: 2015-07-21
  Administered 2015-07-20 (×2): via INTRAVENOUS

## 2015-07-20 MED ORDER — LISINOPRIL 20 MG TAB
20 mg | Freq: Every day | ORAL | Status: DC
Start: 2015-07-20 — End: 2015-07-22
  Administered 2015-07-20 – 2015-07-21 (×2): via ORAL

## 2015-07-20 MED ORDER — HYDROCHLOROTHIAZIDE 25 MG TAB
25 mg | Freq: Every day | ORAL | Status: DC
Start: 2015-07-20 — End: 2015-07-22
  Administered 2015-07-20 – 2015-07-21 (×2): via ORAL

## 2015-07-20 MED ORDER — ENOXAPARIN 40 MG/0.4 ML SUB-Q SYRINGE
40 mg/0.4 mL | SUBCUTANEOUS | Status: DC
Start: 2015-07-20 — End: 2015-07-22

## 2015-07-20 MED ORDER — ONDANSETRON (PF) 4 MG/2 ML INJECTION
4 mg/2 mL | Freq: Four times a day (QID) | INTRAMUSCULAR | Status: DC | PRN
Start: 2015-07-20 — End: 2015-07-22
  Administered 2015-07-20 – 2015-07-21 (×3): via INTRAVENOUS

## 2015-07-20 MED ORDER — METOCLOPRAMIDE 5 MG/ML IJ SOLN
5 mg/mL | Freq: Four times a day (QID) | INTRAMUSCULAR | Status: DC | PRN
Start: 2015-07-20 — End: 2015-07-22

## 2015-07-20 MED ORDER — DIPHENHYDRAMINE HCL 50 MG/ML IJ SOLN
50 mg/mL | Freq: Four times a day (QID) | INTRAMUSCULAR | Status: DC | PRN
Start: 2015-07-20 — End: 2015-07-22
  Administered 2015-07-20: 08:00:00 via INTRAVENOUS

## 2015-07-20 MED ORDER — LAMOTRIGINE 100 MG TAB
100 mg | Freq: Two times a day (BID) | ORAL | Status: DC
Start: 2015-07-20 — End: 2015-07-22
  Administered 2015-07-20 – 2015-07-21 (×4): via ORAL

## 2015-07-20 MED ORDER — LEVETIRACETAM 500 MG TAB
500 mg | Freq: Two times a day (BID) | ORAL | Status: DC
Start: 2015-07-20 — End: 2015-07-22
  Administered 2015-07-20 – 2015-07-21 (×4): via ORAL

## 2015-07-20 MED ORDER — FLUOXETINE 20 MG CAP
20 mg | Freq: Every day | ORAL | Status: DC
Start: 2015-07-20 — End: 2015-07-22
  Administered 2015-07-20 – 2015-07-21 (×2): via ORAL

## 2015-07-20 MED FILL — LAMOTRIGINE 100 MG TAB: 100 mg | ORAL | Qty: 2

## 2015-07-20 MED FILL — SODIUM CHLORIDE 0.9 % IV: INTRAVENOUS | Qty: 1000

## 2015-07-20 MED FILL — DIPHENHYDRAMINE HCL 50 MG/ML IJ SOLN: 50 mg/mL | INTRAMUSCULAR | Qty: 1

## 2015-07-20 MED FILL — HYDROCHLOROTHIAZIDE 25 MG TAB: 25 mg | ORAL | Qty: 1

## 2015-07-20 MED FILL — LOVENOX 40 MG/0.4 ML SUBCUTANEOUS SYRINGE: 40 mg/0.4 mL | SUBCUTANEOUS | Qty: 0.4

## 2015-07-20 MED FILL — LEVETIRACETAM 500 MG TAB: 500 mg | ORAL | Qty: 3

## 2015-07-20 MED FILL — LISINOPRIL 20 MG TAB: 20 mg | ORAL | Qty: 1

## 2015-07-20 MED FILL — FLUOXETINE 20 MG CAP: 20 mg | ORAL | Qty: 2

## 2015-07-20 MED FILL — LOVASTATIN 20 MG TAB: 20 mg | ORAL | Qty: 1

## 2015-07-20 MED FILL — ONDANSETRON (PF) 4 MG/2 ML INJECTION: 4 mg/2 mL | INTRAMUSCULAR | Qty: 2

## 2015-07-20 NOTE — Progress Notes (Signed)
Patient has designated ____________life partner____________ to participate in his/her discharge plan and to receive any needed information.     Name: [name]  Address:  Phone number:[phone]

## 2015-07-20 NOTE — Progress Notes (Signed)
[facility]   Discharge Planning/Social Services Assessment    Reasons for Intervention: Spoke with [name], disabled due to epilepsey and bipolar disorders. Has mcr. Lives with life partner, he designates him  And his cousin bobby buckelew for dcp. He is independent with adls and amb.  Does not have a pcp referral to life coach. [place]. obs letter given copy to chart. Plan home.    High Risk Criteria   Yes  No   Physician Referral   Yes  No        Date    Nursing Referral   Yes  No        Date    Patient/Family Request   Yes  No        Date       Resources:    Medicare   Yes  No   Medicaid   Yes  No   No Resources   Yes  No   Private Insurance   Yes  No   Case Manager Name/Phone Number    Other   Yes  No        (i.e. Workman's Comp)         Prior Services:    Prior Services   Yes  No   Home Health   Yes  No   Agency    Private Home Care   Yes  No        Number of Hours    Home Care Program   Yes  No   Case Manager    Meals on Wheels   Yes  No   Office on Aging   Yes  No   Transportation Services   Yes  No   Nursing Home   Yes  No        Nursing Home Name    Rehab/VA Hospital   Yes  No        Rehab/VA Name    Other       Information Source:      Information obtained from   Patient   Parent    Guardian   Child   Spouse    Significant Other/Partner    Friend       EMS     Nursing Home Chart           Other:   Chart Review   Yes  No     Family/Support System:    Patient lives with   Alone     Spouse    Significant Other   Children   Caretaker    Parent   Sibling      Other       Other Support System:    Is the patient responsible for care of others   Yes  No   Information of person caring for patient on  discharge    Managers financial affairs independently   Yes  No   If no, explain:      Status Prior to Admission:    Mental Status   Awake   Alert   Oriented   Quiet/Calm  Lethargic/Sedated    Disoriented   Restless/Anxious   Combative    Personal Care   Dependent   Independent Personal Care   Requires Assistance   Meal Preparation Ability   Independent    Standby Assistance    Minimal Assistance    Moderate Assistance  Maximum Assistance      Total Assistance   Chores   Independent with Jeannette Resident    Requires Assistance   Bowel/Bladder   Continent   Catheter   Incontinent   Ostomy Self-Care     Urine Diversion Self-Care   Maximum Assistance      Total Assistance   Number of Persons needed for assistance    DME at home   [place], [place], Straight    Commode     Bathroom/Grab [facility] Bed   Nebulizer   Oxygen            Raised Toilet Seat   Shower Chair   Side Rails for Bed    Tub Transfer Bench    Walker, Building surveyor, Standard       Other:   Vendor      Treatment Presently Receiving:    Current Treatments   Chemotherapy   Dialysis   Insulin   IVAB  IVF    O2   PCA    [name]    RT    Tube Feedings    Wound Care     Psychosocial Evaluation:    Verbalized Knowledge of Disease Process   Patient  Family   Coping with Disease Process   Patient  Family   Requires Further Counseling Coping with Disease Process   Patient  Family     Identified Projected Needs:    Home Health Aid   Yes  No   Transportation   Yes  No   Education   Yes  No        Specific Education     Financial Counseling   Yes  No   Inability to Care for Self/Will Require 24 hour care   Yes  No   Pain Management   Yes  No   Home Infusion Therapy   Yes  No   Oxygen Therapy   Yes  No   DME   Yes  No   Long Term Care Placement   Yes  No   Rehab   Yes  No   Physical Therapy   Yes  No   Needs Anticipated At This Time   Yes  No     Intra-Hospital Referral:    [place]   Yes  No   Life Coach   Yes  No   Patient Representative   Yes  No   Staff for Teaching Needs   Yes  No   Specialty Teaching Needs     Diabetic Educator   Yes  No   Referral for Diabetic Educator Needed   Yes  No  If Yes, place order for Nutritionist or Diabetic Consult      Tentative Discharge Plan:    Home with No Services   Yes  No   Home with Home Health Follow-up   Yes  No        If Yes, specify type    [place]   Yes  No        If Yes, specify type    Meals on Wheels   Yes  No   Office of Aging   Yes  No   NHP   Yes  No   Return to the Nursing Home   Yes  No   Rehab Therapy   Yes  No   Acute Rehab  Yes  No   Subacute Rehab   Yes  No   Private Care   Yes  No   Substance Abuse Referral   Yes  No   Transportation   Yes  No   Chore Service   Yes  No   Inpatient Hospice   Yes  No   OP RT   Yes   No   OP Hemo   Yes   No   OP [name]   Yes  No   Support Group   Yes  No   Reach to Recovery   Yes  No   OP Oncology Clinic   Yes  No   Clinic Appointment   Yes  No   DME   Yes  No   Comments    Name of D/C Planner or Social Worker Given to Patient or Family Debra balbo rn cm   Phone Number [phone]        Extension    Date 07/20/15   Time    If you are discharged home, whom do you designate to participate in your discharge plan and receive any information needed?     Enter name of designee         Phone # of designee         Address of designee         Updated         Patient refused to designate any           individual

## 2015-07-20 NOTE — Progress Notes (Signed)
Medicine Progress Note    Patient: [name]   Age:  29 y.o.  DOA: 07/19/2015   Admit Dx / CC: phenytoin toxicity  LOS:  LOS: 0 days     Assessment/Plan   Active Problems:    Accidental phenytoin poisoning (07/19/2015)      Unsteady gait (07/20/2015)        Additional Plan notes   Phenytoin Toxicity  - Recheck Phenytoin level every 8 hours until < 10  - Hold phenytoin   - Supportive care  - continue IVF   - Continuous cardiac monitoring  ??  Unsteady gait  - 2/2 above   - anticipate improvement with phenytoin level decrease  - Neuro check q 4 hours  - PT eval   - Fall precaution  ??  Dizziness  - 2/2 phenytoin toxicity  ??  Hx Seizures  - home medications except Phenytoin hold d/t toxic level    DISPO     Anticipated Date of Discharge: 12/25  Anticipated Disposition (home, SNF) : home    Subjective:   Patient seen and examined.  Complains of severe dizziness, mild nausea    Objective:     Visit Vitals   ??? BP 110/73   ??? Pulse (!) 59   ??? Temp 97.7 ??F (36.5 ??C)   ??? Resp 20   ??? Ht  (1.778 m)   ??? Wt 108.9 kg (240 lb)   ??? SpO2 96%   ??? BMI 34.44 kg/m2       Physical Exam:  General appearance: alert, cooperative, no distress, appears stated age  Head: Normocephalic, without obvious abnormality, atraumatic  Neck: supple, trachea midline  Lungs: clear to auscultation bilaterally  Heart: regular rate and rhythm, S1, S2 normal, no murmur, click, rub or gallop  Abdomen: soft, non-tender. Bowel sounds normal. No masses,  no organomegaly  Extremities: extremities normal, atraumatic, no cyanosis or edema  Skin: Skin color, texture, turgor normal. No rashes or lesions  Neurologic: Grossly normal  PSY: Mood and affect normal, appropriately behaved    Intake and Output:  Current Shift:     Last three shifts:       Lab/Data Reviewed:  CMP:   Lab Results   Component Value Date/Time    NA 143 07/20/2015 03:41 AM    K 3.8 07/20/2015 03:41 AM    CL 106 07/20/2015 03:41 AM    CO2 28 07/20/2015 03:41 AM     AGAP 9 07/20/2015 03:41 AM    GLU 101 (H) 07/20/2015 03:41 AM    BUN 11 07/20/2015 03:41 AM    CREA 0.78 07/20/2015 03:41 AM    GFRAA >60 07/20/2015 03:41 AM    GFRNA >60 07/20/2015 03:41 AM    CA 9.3 07/20/2015 03:41 AM    MG 2.0 07/19/2015 08:43 PM    ALB 3.7 07/20/2015 03:41 AM    TP 6.6 07/20/2015 03:41 AM    GLOB 2.9 07/20/2015 03:41 AM    AGRAT 1.3 07/20/2015 03:41 AM    SGOT 18 07/20/2015 03:41 AM    ALT 30 07/20/2015 03:41 AM     CBC:   Lab Results   Component Value Date/Time    WBC 9.4 07/20/2015 03:41 AM    HGB 14.3 07/20/2015 03:41 AM    HCT 43.4 07/20/2015 03:41 AM    PLT 209 07/20/2015 03:41 AM     All Cardiac Markers in the last 24 hours: No results found for: CPK, CKMMB, CKMB, RCK3, CKMBT,  CKNDX, CKND1, MYO, TROPT, TROIQ, TROI, TROPT, TNIPOC, BNP, BNPP    Medications Reviewed:  Current Facility-Administered Medications   Medication Dose Route Frequency   ??? enoxaparin (LOVENOX) injection 40 mg  40 mg SubCUTAneous Q24H   ??? 0.9% sodium chloride infusion  125 mL/hr IntraVENous CONTINUOUS   ??? metoclopramide HCl (REGLAN) injection 10 mg  10 mg IntraVENous Q6H PRN   ??? FLUoxetine (PROzac) capsule 40 mg  40 mg Oral DAILY   ??? hydroCHLOROthiazide (HYDRODIURIL) tablet 25 mg  25 mg Oral DAILY   ??? lamoTRIgine (LaMICtal) tablet 200 mg  200 mg Oral BID   ??? levETIRAcetam (KEPPRA) tablet 1,500 mg  1,500 mg Oral BID   ??? lisinopril (PRINIVIL, ZESTRIL) tablet 20 mg  20 mg Oral DAILY   ??? lovastatin (MEVACOR) tablet 20 mg  20 mg Oral QHS   ??? diphenhydrAMINE (BENADRYL) injection 12.5 mg  12.5 mg IntraVENous Q6H PRN   ??? ondansetron (ZOFRAN) injection 4 mg  4 mg IntraVENous Q6H PRN       Gevena BarreAaron M Georgeanna Radziewicz, MD    July 20, 2015

## 2015-07-20 NOTE — Progress Notes (Addendum)
Bedside and Verbal shift change report given to Tomasa RandPamela Holmes RN (oncoming nurse) by Olegario MessierAlita RN (offgoing nurse). Report included the following information SBAR, Kardex, Intake/Output and MAR. Patient complaining of generalized  numbness and tinging, also complaining of nausea, paged Dr. Andrey CampanileWilson for new orders.    [id number]: Patient has new orders for Zofran 4 mg every 6 hrs.      1139: Patient resting in bed with eyes closed. Will continue to monitor.    1305: Assisted patient to bathroom, patient complaining of dizziness, will continue to monitor.     1648: Patient laying in bed, has no complaints, patient states the only time he has s/s of dizziness is when he is sleeping.   Bedside and Verbal shift change report given to Levon HedgerAlita RN (oncoming nurse) by Tomasa RandPamela Holmes RN (offgoing nurse). Report included the following information SBAR, Kardex, Intake/Output and MAR.

## 2015-07-20 NOTE — Progress Notes (Signed)
Problem: Discharge Planning  Goal: *Discharge to safe environment  Outcome: Resolved/Met Date Met:  07/20/15  home

## 2015-07-20 NOTE — Other (Signed)
0100 - Patient arrived in the unit. He is AOx4, slightly anxious, but interacting appropriately. He denies suicide ideations, and states that intoxication was accidental.    0130 - Admission assessment performed without incidents. Pt C/O mild itchiness\\    0243 - Pt stated that itching is getting worse, hospitalist added benedryl to pt's medications.    Bedside and Verbal shift change report given to Mikki SanteePam Holmes, RN (oncoming nurse) by Derenda FennelAllita Braga, RN (offgoing nurse). Report included the following information Kardex, ED Summary and MAR.
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
## 2015-07-20 NOTE — Progress Notes (Signed)
Patient and/or next of kin has been given the ???Medicare Outpatient Observation Information and Notification??? letter and all questions answered.

## 2015-07-21 LAB — METABOLIC PANEL, BASIC
Anion gap: 10 mmol/L (ref 3.0–18)
BUN/Creatinine ratio: 12 (ref 12–20)
BUN: 10 MG/DL (ref 7.0–18)
CO2: 28 mmol/L (ref 21–32)
Calcium: 8.8 MG/DL (ref 8.5–10.1)
Chloride: 105 mmol/L (ref 100–108)
Creatinine: 0.83 MG/DL (ref 0.6–1.3)
GFR est AA: >60 mL/min/{1.73_m2} (ref >60)
GFR est non-AA: >60 mL/min/{1.73_m2} (ref >60)
Glucose: 104 mg/dL — ABNORMAL HIGH (ref 74–99)
Potassium: 3.8 mmol/L (ref 3.5–5.5)
Sodium: 143 mmol/L (ref 136–145)

## 2015-07-21 LAB — PHENYTOIN
Phenytoin: 32.8 ug/mL — ABNORMAL HIGH (ref 10.0–20.0)
Phenytoin: 32.9 ug/mL — ABNORMAL HIGH (ref 10.0–20.0)

## 2015-07-21 LAB — GLUCOSE, POC: Glucose (POC): 89 mg/dL (ref 70–110)

## 2015-07-21 MED ORDER — NICOTINE 21 MG/24 HR DAILY PATCH
21 mg/24 hr | Freq: Every day | TRANSDERMAL | Status: DC
Start: 2015-07-21 — End: 2015-07-22

## 2015-07-21 MED ORDER — SODIUM CHLORIDE 0.9 % IV
INTRAVENOUS | Status: DC
Start: 2015-07-21 — End: 2015-07-22
  Administered 2015-07-21: 17:00:00 via INTRAVENOUS

## 2015-07-21 MED FILL — SODIUM CHLORIDE 0.9 % IV: INTRAVENOUS | Qty: 1000

## 2015-07-21 MED FILL — LOVASTATIN 20 MG TAB: 20 mg | ORAL | Qty: 1

## 2015-07-21 MED FILL — LEVETIRACETAM 500 MG TAB: 500 mg | ORAL | Qty: 3

## 2015-07-21 MED FILL — NICOTINE 21 MG/24 HR DAILY PATCH: 21 mg/24 hr | TRANSDERMAL | Qty: 1

## 2015-07-21 MED FILL — ONDANSETRON (PF) 4 MG/2 ML INJECTION: 4 mg/2 mL | INTRAMUSCULAR | Qty: 2

## 2015-07-21 MED FILL — HYDROCHLOROTHIAZIDE 25 MG TAB: 25 mg | ORAL | Qty: 1

## 2015-07-21 MED FILL — LAMOTRIGINE 100 MG TAB: 100 mg | ORAL | Qty: 2

## 2015-07-21 MED FILL — FLUOXETINE 20 MG CAP: 20 mg | ORAL | Qty: 2

## 2015-07-21 MED FILL — LISINOPRIL 20 MG TAB: 20 mg | ORAL | Qty: 1

## 2015-07-21 NOTE — Progress Notes (Addendum)
0715 Bedside and Verbal shift change report given to Carney CornersMorgan L Portillo RN (oncoming nurse) by Derenda FennelAllita Braga RN (offgoing nurse). Report included the following information SBAR, Kardex and MAR.     0820 PRN Zofran given. Scheduled Lisinopril and Hydrochlorothiazide held due to low BP of 98/53. Will recheck and give later if BP increases.    1015 Patient states he would like to smoke and that he is is having frequent spells of dizziness. BP checked (114/68). Called Dr. Andrey CampanileWilson. Order for nicotine patch received. No orders regarding patients feelings of dizziness.     1045 Lisinopril and hydrochlorothiazide given.    1420 Patient states he needs to sit up more in bed but feels more nauseous when doing so. PRN Zofran given.    1500 Dilantin level increased from 32.8 to 32.9. Paged Dr. Andrey CampanileWilson. Order for Chem 7 received.    1600 Assisted patient with ambulating to bathroom. Gait more steady and patient only needing stand by assist.    1925 Bedside and Verbal shift change report given to Derenda FennelAllita Braga RN (oncoming nurse) by Carney CornersMorgan L Portillo RN (offgoing nurse). Report included the following information SBAR, Kardex and MAR.

## 2015-07-21 NOTE — Other (Signed)
Bedside and Verbal shift change report given to Derenda FennelAllita Braga, RN (oncoming nurse) by Mikki SanteePam Holmes, RN (offgoing nurse). Report included the following information SBAR, MAR, Recent Results and Med Rec Status.    2200 - patient C/O hunger. Healthy choice meal, pudding, gingerale, water, and crackers.      0400 - Room cleaned. Pt stated he will have visitors, and would like the room to look presentable for when his friends came to visit.    0630 - VS checked, BP slightly decreased, and latest phenytoin level checked, increased to 32.8.

## 2015-07-21 NOTE — Progress Notes (Signed)
Medicine Progress Note    Patient: [name] CommonsChristopher Neil [name]   Age:  29 y.o.  DOA: 07/19/2015   Admit Dx / CC: phenytoin toxicity  LOS:  LOS: 0 days     Assessment/Plan   Active Problems:    Accidental phenytoin poisoning (07/19/2015)      Unsteady gait (07/20/2015)        Additional Plan notes   Patient presents with dizziness and Nausea found to have elevated phenytoin levels, we are monitoring q 8 but they continue to be high although his symptoms have improved.  Patient may dc when levels reach normal which is 10-20.      Phenytoin Toxicity  - Recheck Phenytoin level every 8 hours until < 10  - Hold phenytoin   - Supportive care  - continue IVF   - Continuous cardiac monitoring  ??  Unsteady gait  - 2/2 above   - anticipate improvement with phenytoin level decrease  - Neuro check q 4 hours  - PT eval   - Fall precaution  ??  Dizziness  - 2/2 phenytoin toxicity  ??  Hx Seizures  - home medications except Phenytoin hold d/t toxic level    DISPO     Anticipated Date of Discharge: 12/25  Anticipated Disposition (home, SNF) : home    Subjective:   Patient seen and examined.  Improved dizziness and Nausea    Objective:     Visit Vitals   ??? BP 98/53   ??? Pulse 62   ??? Temp 98.2 ??F (36.8 ??C)   ??? Resp 20   ??? Ht 5\' 10"  (1.778 m)   ??? Wt 108.9 kg (240 lb)   ??? SpO2 96%   ??? BMI 34.44 kg/m2       Physical Exam:  General appearance: alert, cooperative, no distress, appears stated age  Head: Normocephalic, without obvious abnormality, atraumatic  Neck: supple, trachea midline  Lungs: clear to auscultation bilaterally  Heart: regular rate and rhythm, S1, S2 normal, no murmur, click, rub or gallop  Abdomen: soft, non-tender. Bowel sounds normal. No masses,  no organomegaly  Extremities: extremities normal, atraumatic, no cyanosis or edema  Skin: Skin color, texture, turgor normal. No rashes or lesions  Neurologic: Grossly normal  PSY: Mood and affect normal, appropriately behaved    Intake and Output:  Current Shift:      Last three shifts:  12/23 1901 - 12/25 0700  In: 1940 [P.O.:840; I.V.:1100]  Out: -     Lab/Data Reviewed:  CMP:   No results found for: NA, K, CL, CO2, AGAP, GLU, BUN, CREA, GFRAA, GFRNA, CA, MG, PHOS, ALB, TBIL, TP, ALB, GLOB, AGRAT, SGOT, ALT, GPT  CBC:   No results found for: WBC, HGB, HGBEXT, HCT, HCTEXT, PLT, PLTEXT, HGBEXT, HCTEXT, PLTEXT  All Cardiac Markers in the last 24 hours: No results found for: CPK, CKMMB, CKMB, RCK3, CKMBT, CKNDX, CKND1, MYO, TROPT, TROIQ, TROI, TROPT, TNIPOC, BNP, BNPP    Medications Reviewed:  Current Facility-Administered Medications   Medication Dose Route Frequency   ??? enoxaparin (LOVENOX) injection 40 mg  40 mg SubCUTAneous Q24H   ??? 0.9% sodium chloride infusion  125 mL/hr IntraVENous CONTINUOUS   ??? metoclopramide HCl (REGLAN) injection 10 mg  10 mg IntraVENous Q6H PRN   ??? FLUoxetine (PROzac) capsule 40 mg  40 mg Oral DAILY   ??? hydroCHLOROthiazide (HYDRODIURIL) tablet 25 mg  25 mg Oral DAILY   ??? lamoTRIgine (LaMICtal) tablet 200 mg  200 mg Oral BID   ???  levETIRAcetam (KEPPRA) tablet 1,500 mg  1,500 mg Oral BID   ??? lisinopril (PRINIVIL, ZESTRIL) tablet 20 mg  20 mg Oral DAILY   ??? lovastatin (MEVACOR) tablet 20 mg  20 mg Oral QHS   ??? diphenhydrAMINE (BENADRYL) injection 12.5 mg  12.5 mg IntraVENous Q6H PRN   ??? ondansetron (ZOFRAN) injection 4 mg  4 mg IntraVENous Q6H PRN       Gevena Barre, MD    July 21, 2015

## 2015-07-21 NOTE — Progress Notes (Signed)
Attempted to see patient for ambulation. Declined citing dizziness and "my phenytoin level is still high."  Reports ambulating to<>from bathroom with nursing. Will cont. to follow.

## 2015-07-21 NOTE — Other (Signed)
1900 - Bedside and Verbal shift change report given to Derenda FennelAllita Braga, RN (oncoming nurse) by Burnett KanarisMorgan P., RN (offgoing nurse). Report included the following information SBAR, Recent Results, Med Rec Status and Cardiac Rhythm NSR. Patient asked to have a bath, I let him know I had to pass some medications first, and see what I needed to start my shift, but after that I would help him with that.    2000 - Shift assessment, patient resting quietly.    2100 - This nurse helped patient with his basin, soap and towels, and changed the linens of the bed. Patient received a clean gown as well. As pt finished his shower, he requested that his new levels have been checked. Results show yet another increase. Patient was clearly frustrated, and decided he was not getting anything at the hospital, that he was not going to remain hospitalized. He woud like   To go  Home, against medical advice. Nurse advised against his decision, and explained the risks. Pt had made up his mind about leaving.    2130 - Dr Romie Jumperhukwuma paged, but patient stated that he will not wait any minute longer. So this nurse prepared the documents.    2203 - Pt signed leaving without medical advice. This nurse pull out any IV accesses patient had and  and he walked off the unit.

## 2015-07-22 LAB — EKG, 12 LEAD, INITIAL
Atrial Rate: 65 {beats}/min
Calculated P Axis: 35 degrees
Calculated R Axis: 0 degrees
Calculated T Axis: 10 degrees
Diagnosis: NORMAL
P-R Interval: 150 ms
Q-T Interval: 402 ms
QRS Duration: 98 ms
QTC Calculation (Bezet): 418 ms
Ventricular Rate: 65 {beats}/min

## 2015-07-22 LAB — PHENYTOIN: Phenytoin: 35.2 ug/mL — ABNORMAL HIGH (ref 10.0–20.0)

## 2016-01-15 ENCOUNTER — Observation Stay

## 2016-01-15 ENCOUNTER — Emergency Department: Admit: 2016-01-16 | Payer: MEDICARE

## 2016-01-15 DIAGNOSIS — G40909 Epilepsy, unspecified, not intractable, without status epilepticus: Secondary | ICD-10-CM

## 2016-01-15 NOTE — ED Notes (Signed)
Patient left department with medic on cardiac monitor for transportation to inpatient bed. Patient's VS at the time of transfer were BP 130/68, 98% on RA, denies pain at this time, 98.6 orally, HR 69 rhythm on the monitor. Patient was alert and oriented x 4 and denies further needs at time of transfer, still remains with no seizure like activity.    TRANSFER - OUT REPORT:    Verbal report given to Rosey Batheresa, RN(name) on [name]  being transferred to [address](unit) for routine progression of care       Report consisted of patient???s Situation, Background, Assessment and   Recommendations(SBAR).     Information from the following report(s) SBAR, Kardex, ED Summary, [facility] and Recent Results was reviewed with the receiving nurse.      Opportunity for questions and clarification was provided.

## 2016-01-15 NOTE — ED Notes (Signed)
Report attempted.

## 2016-01-15 NOTE — ED Triage Notes (Signed)
Triage:  Pt to ED due to multiple seizure like events throughout the day.  Pt states he has had a total of 7 seizures throughout the day. Pt states the seizures are grand mal in description with LOC following each event.  Pt states he does note clearly remember the events and states when he comes out he is disoriented and has difficulty talking and interaction with others.  Pt states he is uncertain of any falls pre/post seizures. Pt also states he has severe pain to base of his head and upper neck, Pt states the pain increases with rotation of his head more noted to the left.  Pt also mentions that his lips are intermittently tingly. Pt states he generally aches and is fatigued from the seizure events.

## 2016-01-15 NOTE — ED Notes (Signed)
Patient sleeping in room, aware of pending admission. Pt remains with no seizure like activity since arrival.

## 2016-01-15 NOTE — ED Notes (Addendum)
Patient ambulatory to treatment room and CT without issue. No seizure like activity at this time. Will continue to monitor, seizure pads placed on bed.

## 2016-01-15 NOTE — Other (Signed)
TRANSFER - IN REPORT:    Verbal report received from Erin, RN(name) on Shela CommonsChristopher Neil Scheeler  being received from ED(unit) for routine progression of care      Report consisted of patient???s Situation, Background, Assessment and   Recommendations(SBAR).     Information from the following report(s) SBAR, Kardex, ED Summary, [facility] and Recent Results was reviewed with the receiving nurse.    Opportunity for questions and clarification was provided.      Assessment completed upon patient???s arrival to unit and care assumed.
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
## 2016-01-16 ENCOUNTER — Inpatient Hospital Stay
Admit: 2016-01-16 | Discharge: 2016-01-16 | Disposition: A | Discharge status: Home / Self Care | Payer: MEDICARE | Attending: Emergency Medicine

## 2016-01-16 LAB — METABOLIC PANEL, COMPREHENSIVE
A-G Ratio: 1.1 (ref 1.1–2.2)
ALT (SGPT): 51 U/L (ref 12–78)
AST (SGOT): 25 U/L (ref 15–37)
Albumin: 4.5 g/dL (ref 3.5–5.0)
Alk. phosphatase: 133 U/L — ABNORMAL HIGH (ref 45–117)
Anion gap: 9 mmol/L (ref 5–15)
BUN/Creatinine ratio: 14 (ref 12–20)
BUN: 13 MG/DL (ref 6–20)
Bilirubin, total: 0.4 MG/DL (ref 0.2–1.0)
CO2: 26 mmol/L (ref 21–32)
Calcium: 9.6 MG/DL (ref 8.5–10.1)
Chloride: 104 mmol/L (ref 97–108)
Creatinine: 0.92 MG/DL (ref 0.70–1.30)
GFR est AA: >60 mL/min/{1.73_m2} (ref >60)
GFR est non-AA: >60 mL/min/{1.73_m2} (ref >60)
Globulin: 4.2 g/dL — ABNORMAL HIGH (ref 2.0–4.0)
Glucose: 96 mg/dL (ref 65–100)
Potassium: 4.2 mmol/L (ref 3.5–5.1)
Protein, total: 8.7 g/dL — ABNORMAL HIGH (ref 6.4–8.2)
Sodium: 139 mmol/L (ref 136–145)

## 2016-01-16 LAB — CBC WITH AUTOMATED DIFF
ABS. BASOPHILS: 0 10*3/uL (ref 0.0–0.1)
ABS. EOSINOPHILS: 0.2 10*3/uL (ref 0.0–0.4)
ABS. LYMPHOCYTES: 3 10*3/uL (ref 0.8–3.5)
ABS. MONOCYTES: 0.6 10*3/uL (ref 0.0–1.0)
ABS. NEUTROPHILS: 4 10*3/uL (ref 1.8–8.0)
BASOPHILS: 0 % (ref 0–1)
EOSINOPHILS: 3 % (ref 0–7)
HCT: 44.6 % (ref 36.6–50.3)
HGB: 15.2 g/dL (ref 12.1–17.0)
LYMPHOCYTES: 38 % (ref 12–49)
MCH: 29.9 PG (ref 26.0–34.0)
MCHC: 34.1 g/dL (ref 30.0–36.5)
MCV: 87.8 FL (ref 80.0–99.0)
MONOCYTES: 8 % (ref 5–13)
NEUTROPHILS: 51 % (ref 32–75)
PLATELET: 285 10*3/uL (ref 150–400)
RBC: 5.08 M/uL (ref 4.10–5.70)
RDW: 12.9 % (ref 11.5–14.5)
WBC: 7.8 10*3/uL (ref 4.1–11.1)

## 2016-01-16 LAB — DRUG SCREEN, URINE
AMPHETAMINES: NEGATIVE
BARBITURATES: NEGATIVE
BENZODIAZEPINES: NEGATIVE
COCAINE: NEGATIVE
METHADONE: NEGATIVE
OPIATES: NEGATIVE
PCP(PHENCYCLIDINE): NEGATIVE
THC (TH-CANNABINOL): POSITIVE — AB

## 2016-01-16 LAB — URINALYSIS W/MICROSCOPIC
Bacteria: NEGATIVE /hpf
Blood: NEGATIVE
Glucose: NEGATIVE mg/dL
Leukocyte Esterase: NEGATIVE
Nitrites: NEGATIVE
Protein: NEGATIVE mg/dL
Specific gravity: 1.025 (ref 1.003–1.030)
Urobilinogen: 1 EU/dL (ref 0.2–1.0)
pH (UA): 6 (ref 5.0–8.0)

## 2016-01-16 LAB — CK: CK: 159 U/L (ref 39–308)

## 2016-01-16 LAB — BILIRUBIN, CONFIRM: Bilirubin UA, confirm: NEGATIVE

## 2016-01-16 LAB — PHENYTOIN: Phenytoin: 30.2 ug/mL — CR (ref 10.0–20.0)

## 2016-01-16 LAB — MAGNESIUM: Magnesium: 2.5 mg/dL — ABNORMAL HIGH (ref 1.6–2.4)

## 2016-01-16 MED ORDER — CLONAZEPAM 1 MG TAB
1 mg | Freq: Three times a day (TID) | ORAL | Status: DC
Start: 2016-01-16 — End: 2016-01-19
  Administered 2016-01-16 – 2016-01-19 (×10): via ORAL

## 2016-01-16 MED ORDER — LEVETIRACETAM 500 MG TAB
500 mg | Freq: Two times a day (BID) | ORAL | Status: DC
Start: 2016-01-16 — End: 2016-01-19
  Administered 2016-01-16 – 2016-01-19 (×7): via ORAL

## 2016-01-16 MED ORDER — FLUOXETINE 20 MG CAP
20 mg | Freq: Every day | ORAL | Status: DC
Start: 2016-01-16 — End: 2016-01-19
  Administered 2016-01-16 – 2016-01-19 (×4): via ORAL

## 2016-01-16 MED ORDER — ENOXAPARIN 40 MG/0.4 ML SUB-Q SYRINGE
40 mg/0.4 mL | SUBCUTANEOUS | Status: DC
Start: 2016-01-16 — End: 2016-01-18

## 2016-01-16 MED ORDER — SODIUM CHLORIDE 0.9 % IJ SYRG
INTRAMUSCULAR | Status: DC | PRN
Start: 2016-01-16 — End: 2016-01-19

## 2016-01-16 MED ORDER — LAMOTRIGINE 100 MG TAB
100 mg | Freq: Two times a day (BID) | ORAL | Status: DC
Start: 2016-01-16 — End: 2016-01-19
  Administered 2016-01-16 – 2016-01-19 (×7): via ORAL

## 2016-01-16 MED ORDER — PHENYTOIN SODIUM EXTENDED 100 MG CAP
100 mg | Freq: Two times a day (BID) | ORAL | Status: DC
Start: 2016-01-16 — End: 2016-01-16

## 2016-01-16 MED ORDER — SODIUM CHLORIDE 0.9 % IJ SYRG
Freq: Three times a day (TID) | INTRAMUSCULAR | Status: DC
Start: 2016-01-16 — End: 2016-01-19
  Administered 2016-01-16 – 2016-01-19 (×11): via INTRAVENOUS

## 2016-01-16 MED ORDER — HYDROCHLOROTHIAZIDE 25 MG TAB
25 mg | Freq: Every day | ORAL | Status: DC
Start: 2016-01-16 — End: 2016-01-19
  Administered 2016-01-16 – 2016-01-19 (×4): via ORAL

## 2016-01-16 MED ORDER — LOVASTATIN 20 MG TAB
20 mg | Freq: Every evening | ORAL | Status: DC
Start: 2016-01-16 — End: 2016-01-19
  Administered 2016-01-16 – 2016-01-19 (×4): via ORAL

## 2016-01-16 MED ORDER — LISINOPRIL 10 MG TAB
10 mg | Freq: Every day | ORAL | Status: DC
Start: 2016-01-16 — End: 2016-01-19
  Administered 2016-01-16 – 2016-01-19 (×4): via ORAL

## 2016-01-16 MED FILL — MONOJECT PREFILL ADVANCED 0.9 % SODIUM CHLORIDE INJECTION SYRINGE: INTRAMUSCULAR | Qty: 10

## 2016-01-16 MED FILL — LOVASTATIN 20 MG TAB: 20 mg | ORAL | Qty: 1

## 2016-01-16 MED FILL — LEVETIRACETAM 500 MG TAB: 500 mg | ORAL | Qty: 3

## 2016-01-16 MED FILL — BD POSIFLUSH NORMAL SALINE 0.9 % INJECTION SYRINGE: INTRAMUSCULAR | Qty: 10

## 2016-01-16 MED FILL — HYDROCHLOROTHIAZIDE 25 MG TAB: 25 mg | ORAL | Qty: 1

## 2016-01-16 MED FILL — FLUOXETINE 20 MG CAP: 20 mg | ORAL | Qty: 2

## 2016-01-16 MED FILL — CLONAZEPAM 1 MG TAB: 1 mg | ORAL | Qty: 1

## 2016-01-16 MED FILL — LAMOTRIGINE 100 MG TAB: 100 mg | ORAL | Qty: 2

## 2016-01-16 MED FILL — LOVENOX 40 MG/0.4 ML SUBCUTANEOUS SYRINGE: 40 mg/0.4 mL | SUBCUTANEOUS | Qty: 0.4

## 2016-01-16 MED FILL — LISINOPRIL 10 MG TAB: 10 mg | ORAL | Qty: 2

## 2016-01-16 MED FILL — DILANTIN EXTENDED 100 MG CAPSULE: 100 mg | ORAL | Qty: 3

## 2016-01-16 NOTE — Consults (Signed)
Consult dictated. Recurrent seizure. Currently dilantin toxic. Also on Clonazepam, Lamotrigine and Keppra. Hold dilantin. Check all levels  EEG no seizure activity.  [name] Kray S Koralyn Prestage, MD

## 2016-01-16 NOTE — Progress Notes (Addendum)
2115 Entered patient's room where he was sleeping in bed. Attempted several times to wake patient and finally arousable. Told patient I had nighttime meds for him. Patient then asked that I page on-call hospitalist to get him pain meds stating that his pain is 9/10, mostly in back and L hip region. Patient believes that pain is related to "my 7 seizures I had yesterday that is finally settling in". Patient also stated that "Tylenol does not work for me". Patient laying in bed without distress speaking calmly with nurse. Will page hospitalist.    2140 Paged hospitalist     2200 NP returned call. Orders placed for Tylenol and Dilaudid PRN for pain.     2238 Gave patient PRN order of Dilaudid 1 mg IV for pain    2330 Patient stated pain coming down at 7/10    0045 Patient called nurse in room stating pain has gone back to 9/10 and asking to repage NP to request another dose of Dilaudid. Repaged NP    0130 Spoke with NP regarding pain meds. Unable to give additional dose at this time. Will give a dose at 0230 which is at 4 hours from last dose.    0300 Patient requested to know why he was not on IV fluids to flush out Dilantin. Explained that we could ask MD in the morning. Patient then requested who to speak with regarding being fully tested for STDs while in hospital. Again stated would need to speak with day hospitalist.

## 2016-01-16 NOTE — Progress Notes (Addendum)
Primary Nurse Fransisco BeauAjith Sindhu, RN and Dannielle Burnereasa, RN performed a dual skin assessment on this patient No impairment noted  Braden score is 23    0445 Tamika Rn aware that patient corrected the medication after MD reviewed the med in ED. Patient first dose seizure medication are in the morning and need to review the doses with MD in the morning.

## 2016-01-16 NOTE — Procedures (Signed)
[facility]   [address] [place] [address]   EEG       Name:  [name], [name]   MR#:  [id number]   DOB:  04/07/1986   Account #:  [id number]    Date of Procedure:  01/16/2016   Date of Adm:  01/15/2016       REQUESTING PHYSICIAN: Dr. Koduru    HISTORY: The patient is a 29-year-old male who is being evaluated   for recurrent seizures.     DESCRIPTION: This is an 18-channel EEG performed on an awake   patient. The dominant posterior background rhythm consists of   symmetric, well-modulated, medium voltage rhythms in the 8 Hz   frequency range out of the posterior head region. Frontal regions show   slower theta frequencies. The slowing is slightly more prominent in   the right frontal head region. Photic stimulation and hyperventilation   are not performed.  Drowsiness and sleep architecture was not noted.    EEG SUMMARY: Mildly abnormal EEG with mild slowing seen in both   the frontal head regions, right more than left.     CLINICAL INTERPRETATION: This EEG shows mild focal slowing in   the right frontal area without any associated epileptiform features. No   ongoing electroclinical seizure activity was noted. Please correlate   clinically with the imaging studies.         Evoleth Nordmeyer S Kylia Grajales, MD      AS / RLD   D:  01/16/2016   14:37   T:  01/16/2016   16:40   Job #:  [id number]

## 2016-01-16 NOTE — Progress Notes (Addendum)
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 He was placed in OBS NSTU for breakthrough seizures and found to have supratherapeutic VPA level.     Interval history / Subjective:   Still c/o numbness and tingling in lips, all over muscle pains, dizziness and lightheadedness; currently getting prepped for EEG     Assessment & Plan:     Breakthrough seizures on chronic seizure disorder (POA)  - no seizures since presentation  - supratherapeutic phenytoin level; held on admission; has been supratherapeutic for a long time per our EMR  - continue Lamictal and Keppra; Keppra level pending  - EEG in process  - Neuro consulted; need AEDs adjusted  - seizure precautions  - CT head wo contrast 6/21 right encephalomalacia, no acute  - sees Dr Loreta AveMann[place]    Supratherapeutic phenytoin level (POA) - management as above    Chronic HTN - controlled with lisinopril/HCTZ    HLD - lovastatin    Substance abuse (POA) - UDS +THC, cessation counseling    Nicotine dependence - cessation counseling, declined nicotine patch    Code status: Full  DVT prophylaxis: SCDs    Care Plan discussed with: Patient/Family, Nurse and Case Manager  Disposition: Likely home today or tomorrow     Hospital Problems  Date Reviewed: 02/23/2013          Codes Class Noted POA    Seizures (HCC) ICD-10-CM: R56.9  ICD-9-CM: 780.39  02/21/2013 Yes            Review of Systems:   Pertinent items are noted in HPI.     Vital Signs:     Last 24hrs VS reviewed since prior progress note. Most recent are:  Visit Vitals   ??? BP 145/77 (BP 1 Location: Right arm, BP Patient Position: At rest)   ??? Pulse 68   ??? Temp 98 ??F (36.7 ??C)   ??? Resp 19   ??? Ht 5\' 10"  (1.778 m)   ??? Wt 117.7 kg (259 lb 7.7 oz)   ??? SpO2 98%   ??? BMI 37.23 kg/m2     No intake or output data in the 24 hours ending 01/16/16 0834     Physical Examination:     Constitutional:  awake, no acute distress, cooperative, pleasant, getting EEG??   ENT:  oral mucosa moist, oropharynx benign  Neck supple   Resp:  CTA bilaterally, no wheezing/rhonchi/rales   CV:  regular rhythm, normal rate, no m/r/g appreciated, no edema, +pulses    GI:  +BS, soft, non distended, non tender     Musculoskeletal:  moves all extremities    Neurologic:  AAOx3, NFD     Skin:  warm, dry  Eyes:  PERRL, dysconjugate gaze    Data Review:    Review and/or order of clinical lab test  Review and/or order of tests in the radiology section of CPT  Review and/or order of tests in the medicine section of CPT    Labs:     Recent Labs      01/15/16  2059   WBC  7.8   HGB  15.2   HCT  44.6   PLT  285     Recent Labs      01/15/16   2059   NA  139   K  4.2   CL  104   CO2  26   BUN  13   CREA  0.92   GLU  96   CA  9.6   MG  2.5*     Recent Labs      01/15/16   2059   SGOT  25   ALT  51   AP  133*   TBILI  0.4   TP  8.7*   ALB  4.5   GLOB  4.2*     No results for input(s): INR, PTP, APTT in the last 72 hours.    No lab exists for component: INREXT   No results for input(s): FE, TIBC, PSAT, FERR in the last 72 hours.   No results found for: FOL, RBCF   No results for input(s): PH, PCO2, PO2 in the last 72 hours.  Recent Labs      01/15/16   2059   CPK  159     Lab Results   Component Value Date/Time    Cholesterol, total 168 02/10/2013 12:52 AM    HDL Cholesterol 36 02/10/2013 12:52 AM    LDL, calculated 112.8 02/10/2013 12:52 AM    Triglyceride 96 02/10/2013 12:52 AM    CHOL/HDL Ratio 4.7 02/10/2013 12:52 AM     Lab Results    Component Value Date/Time    Glucose (POC) 89 07/20/2015 11:57 PM    Glucose (POC) 86 09/30/2010 11:06 AM    Glucose (POC) 127 08/21/2010 09:24 PM    Glucose (POC) 124 07/28/2010 07:52 AM     Lab Results   Component Value Date/Time    Color DARK YELLOW 01/15/2016 09:21 PM    Appearance CLEAR 01/15/2016 09:21 PM    Specific gravity 1.025 01/15/2016 09:21 PM    Specific gravity 1.022 08/22/2010 12:45 PM    pH (UA) 6.0 01/15/2016 09:21 PM    Protein NEGATIVE  01/15/2016 09:21 PM    Glucose NEGATIVE  01/15/2016 09:21 PM    Ketone TRACE 01/15/2016 09:21 PM    Bilirubin NEGATIVE  07/19/2015 08:30 PM    Urobilinogen 1.0 01/15/2016 09:21 PM    Nitrites NEGATIVE  01/15/2016 09:21 PM    Leukocyte Esterase NEGATIVE  01/15/2016 09:21 PM    Epithelial cells FEW 01/15/2016 09:21 PM    Bacteria NEGATIVE  01/15/2016 09:21 PM    WBC 0-4 01/15/2016 09:21 PM    RBC 0-5 01/15/2016 09:21 PM         Medications Reviewed:     Current Facility-Administered Medications   Medication Dose Route Frequency   ??? clonazePAM (KlonoPIN) tablet 1 mg  1 mg Oral TID   ??? FLUoxetine (PROzac) capsule 40 mg  40 mg Oral DAILY   ??? hydroCHLOROthiazide (HYDRODIURIL) tablet 25 mg  25 mg Oral DAILY   ??? lamoTRIgine (LaMICtal) tablet 200 mg  200 mg Oral BID   ??? levETIRAcetam (KEPPRA) tablet 1,500 mg  1,500 mg Oral BID   ??? lisinopril (PRINIVIL, ZESTRIL) tablet 20 mg  20 mg Oral DAILY   ??? lovastatin (MEVACOR) tablet 20 mg  20 mg Oral QHS   ??? sodium chloride (NS) flush 5-10 mL  5-10 mL IntraVENous Q8H   ??? sodium chloride (NS) flush  5-10 mL  5-10 mL IntraVENous PRN   ??? enoxaparin (LOVENOX) injection 40 mg  40 mg SubCUTAneous Q24H     ______________________________________________________________________  EXPECTED LENGTH OF STAY: - - -  ACTUAL LENGTH OF STAY:          0                 Delon Sacramento, MD

## 2016-01-16 NOTE — Other (Signed)
IDR/SLIDR Summary          Patient: [name] MRN: [id number]    Age: 30 y.o.     Birthdate: May 08, 1986 Room/Bed: 650/01   Admit Diagnosis: Seizures (HCC)  Principal Diagnosis: <principal problem not specified>   Goals: Seizure precautions  Readmission: NO  Quality Measure: Not applicable  VTE Prophylaxis: Chemical  Influenza Vaccine screening completed? YES  Pneumococcal Vaccine screening completed? NO  Mobility needs: No   Nutrition plan:No  Consults:Case Management    Financial concerns:No  Escalated to CM? YES  RRAT Score: 23   Interventions:  Testing due for pt today? YES  LOS: 0 days Expected length of stay 1 days  Discharge plan: Home   PCP: Phys Other, MD  Transportation needs: No    Days before discharge:one day until discharge   Discharge disposition: Home    Signed:     Erick AlleyStephanie N Shaw, RN  01/16/2016  9:00 AM

## 2016-01-16 NOTE — Progress Notes (Signed)
Bedside and Verbal shift change report given to Stephanie RN (oncoming nurse) by T. Thompson RN (offgoing nurse).  Report given with SBAR, Kardex, Intake/Output, MAR and Recent Results.
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
## 2016-01-16 NOTE — Progress Notes (Signed)
Bedside shift change report given to Heather RN (oncoming nurse) by Stephanie RN (offgoing nurse). Report included the following information SBAR, Intake/Output, MAR, Recent Results and Cardiac Rhythm NSR.

## 2016-01-16 NOTE — Progress Notes (Signed)
STAT EEG COMPLETE
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
## 2016-01-16 NOTE — Procedures (Signed)
[facility]   [address]   [place], [place] [id number]   EEG       Name:  [name], [name] NEIL   MR#:  [id number]   DOB:  1986/04/18   Account #:  [id number]    Date of Procedure:  01/16/2016   Date of Adm:  01/15/2016       REQUESTING PHYSICIAN: Dr. Colletta MarylandKoduru    HISTORY: The patient is a 30 year old male who is being evaluated   for recurrent seizures.     DESCRIPTION: This is an 18-channel EEG performed on an awake   patient. The dominant posterior background rhythm consists of   symmetric, well-modulated, medium voltage rhythms in the 8 Hz   frequency range out of the posterior head region. Frontal regions show   slower theta frequencies. The slowing is slightly more prominent in   the right frontal head region. Photic stimulation and hyperventilation   are not performed.  Drowsiness and sleep architecture was not noted.    EEG SUMMARY: Mildly abnormal EEG with mild slowing seen in both   the frontal head regions, right more than left.     CLINICAL INTERPRETATION: This EEG shows mild focal slowing in   the right frontal area without any associated epileptiform features. No   ongoing electroclinical seizure activity was noted. Please correlate   clinically with the imaging studies.         Lance BoschAMANDEEP S Ulric Salzman, MD      AS / RLD   D:  01/16/2016   14:37   T:  01/16/2016   16:40   Job #:  [id number]
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
## 2016-01-16 NOTE — Consults (Signed)
Consult dictated. Recurrent seizure. Currently dilantin toxic. Also on Clonazepam, Lamotrigine and Keppra. Hold dilantin. Check all levels  EEG no seizure activity.  Lance BoschAmandeep S Myalynn Lingle, MD

## 2016-01-17 LAB — GLUCOSE, POC
Glucose (POC): 127 mg/dL — ABNORMAL HIGH (ref 65–100)
Glucose (POC): 81 mg/dL (ref 65–100)
Glucose (POC): 95 mg/dL (ref 65–100)
Glucose (POC): 111 mg/dL — ABNORMAL HIGH (ref 65–100)

## 2016-01-17 LAB — CBC W/O DIFF
HCT: 41.7 % (ref 36.6–50.3)
HGB: 14.5 g/dL (ref 12.1–17.0)
MCH: 30.2 PG (ref 26.0–34.0)
MCHC: 34.8 g/dL (ref 30.0–36.5)
MCV: 86.9 FL (ref 80.0–99.0)
PLATELET: 244 10*3/uL (ref 150–400)
RBC: 4.8 M/uL (ref 4.10–5.70)
RDW: 12.7 % (ref 11.5–14.5)
WBC: 5.9 10*3/uL (ref 4.1–11.1)

## 2016-01-17 LAB — METABOLIC PANEL, BASIC
Anion gap: 8 mmol/L (ref 5–15)
BUN/Creatinine ratio: 17 (ref 12–20)
BUN: 14 MG/DL (ref 6–20)
CO2: 25 mmol/L (ref 21–32)
Calcium: 8.6 MG/DL (ref 8.5–10.1)
Chloride: 104 mmol/L (ref 97–108)
Creatinine: 0.84 MG/DL (ref 0.70–1.30)
GFR est AA: >60 mL/min/{1.73_m2} (ref >60)
GFR est non-AA: >60 mL/min/{1.73_m2} (ref >60)
Glucose: 114 mg/dL — ABNORMAL HIGH (ref 65–100)
Potassium: 3.6 mmol/L (ref 3.5–5.1)
Sodium: 137 mmol/L (ref 136–145)

## 2016-01-17 LAB — LEVETIRACETAM (KEPPRA)
Levetiracetam (Keppra): 1.3 ug/mL — ABNORMAL LOW (ref 10.0–40.0)
Levetiracetam (Keppra): NOT DETECTED ug/mL (ref 10.0–40.0)

## 2016-01-17 LAB — PHENYTOIN: Phenytoin: 31.6 ug/mL — CR (ref 10.0–20.0)

## 2016-01-17 LAB — MAGNESIUM: Magnesium: 2.2 mg/dL (ref 1.6–2.4)

## 2016-01-17 MED ORDER — SODIUM CHLORIDE 0.9 % IV
INTRAVENOUS | Status: DC
Start: 2016-01-17 — End: 2016-01-17
  Administered 2016-01-17: 10:00:00 via INTRAVENOUS

## 2016-01-17 MED ORDER — HYDROMORPHONE (PF) 1 MG/ML IJ SOLN
1 mg/mL | INTRAMUSCULAR | Status: DC | PRN
Start: 2016-01-17 — End: 2016-01-17
  Administered 2016-01-17 (×3): via INTRAVENOUS

## 2016-01-17 MED ORDER — CYCLOBENZAPRINE 10 MG TAB
10 mg | Freq: Three times a day (TID) | ORAL | Status: DC | PRN
Start: 2016-01-17 — End: 2016-01-19
  Administered 2016-01-18 (×2): via ORAL

## 2016-01-17 MED ORDER — ACETAMINOPHEN 325 MG TABLET
325 mg | Freq: Four times a day (QID) | ORAL | Status: DC | PRN
Start: 2016-01-17 — End: 2016-01-19

## 2016-01-17 MED FILL — BD POSIFLUSH NORMAL SALINE 0.9 % INJECTION SYRINGE: INTRAMUSCULAR | Qty: 10

## 2016-01-17 MED FILL — CLONAZEPAM 1 MG TAB: 1 mg | ORAL | Qty: 1

## 2016-01-17 MED FILL — LAMOTRIGINE 100 MG TAB: 100 mg | ORAL | Qty: 2

## 2016-01-17 MED FILL — LEVETIRACETAM 500 MG TAB: 500 mg | ORAL | Qty: 3

## 2016-01-17 MED FILL — ACETAMINOPHEN 325 MG TABLET: 325 mg | ORAL | Qty: 2

## 2016-01-17 MED FILL — LOVENOX 40 MG/0.4 ML SUBCUTANEOUS SYRINGE: 40 mg/0.4 mL | SUBCUTANEOUS | Qty: 0.4

## 2016-01-17 MED FILL — LISINOPRIL 10 MG TAB: 10 mg | ORAL | Qty: 2

## 2016-01-17 MED FILL — HYDROCHLOROTHIAZIDE 25 MG TAB: 25 mg | ORAL | Qty: 1

## 2016-01-17 MED FILL — HYDROMORPHONE (PF) 1 MG/ML IJ SOLN: 1 mg/mL | INTRAMUSCULAR | Qty: 1

## 2016-01-17 MED FILL — SODIUM CHLORIDE 0.9 % IV: INTRAVENOUS | Qty: 1000

## 2016-01-17 MED FILL — FLUOXETINE 20 MG CAP: 20 mg | ORAL | Qty: 2

## 2016-01-17 MED FILL — CYCLOBENZAPRINE 10 MG TAB: 10 mg | ORAL | Qty: 1

## 2016-01-17 MED FILL — LOVASTATIN 20 MG TAB: 20 mg | ORAL | Qty: 1

## 2016-01-17 NOTE — Other (Signed)
IDR/SLIDR Summary          Patient: [name] MRN: [id number]    Age: 30 y.o.     Birthdate: May 26, 1986 Room/Bed: 650/01   Admit Diagnosis: Seizures (HCC)  Principal Diagnosis: <principal problem not specified>   Goals: seizure free, pain control, dilantin levels, discharge planning  Readmission: NO  Quality Measure: Not applicable  VTE Prophylaxis: Chemical  Influenza Vaccine screening completed? NO  Pneumococcal Vaccine screening completed? NO  Mobility needs: No   Nutrition plan:Yes  Consults:    Financial concerns:No  Escalated to CM? NO  RRAT Score:  23  Interventions:  Testing due for pt today? NO  LOS: 0 days Expected length of stay 2 days  Discharge plan: home after cleared by neuro   PCP: Phys Other, MD  Transportation needs: Yes    Days before discharge:discharge pending  Discharge disposition: Home    Signed:     Mena PaulsHeather L Elliott, RN  01/17/2016  12:31 AM

## 2016-01-17 NOTE — Progress Notes (Signed)
Called patient's reported neurologist Dr Kenna [place]Mann[place]. Nurse Navigator reports that [facility] has [facility]. Patient was only seen by Dr Loreta AveMann in hospital most recently was in 2012 and by Dr Kenna GilbertMann's partner in 2013 in hospital.     He [facility] [facility]/15 and, at that time, was on Lamictal 150mg  po BID and Keppra 1000mg  po BID.     Per NN, patient has bounced around from hospital to hospital.    Delon SacramentoSudha Marquita Lias, MD, MHS

## 2016-01-17 NOTE — Progress Notes (Signed)
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 He was placed in OBS NSTU for breakthrough seizures and found to have supratherapeutic VPA level.     Interval history / Subjective:   Still c/o numbness and tingling in lips, all over muscle pains, dizziness and lightheadedness; overnight was c/o 9/10 pain and received IV Dilaudid x2     Assessment & Plan:     Breakthrough seizures on chronic seizure disorder (POA)  - no seizures since presentation  - supratherapeutic phenytoin level; held on admission; has been supratherapeutic for a long time per our EMR; repeat level is greater than on admission despite not getting Dilantin  - continue Lamictal and Keppra; Keppra level <1.3 --> 0 despite being on 1500mg  po BID  - EEG 6/22 Mildly abnormal EEG with mild slowing seen in both the frontal head regions, right more than left  - Neuro consulted  - seizure precautions  - CT head wo contrast 6/21 right encephalomalacia, no acute  - sees Dr Loreta [place]Mann[place]  - refusing to be discharged until "all my levels are therapeutic"    Supratherapeutic phenytoin level (POA) - should not be resume on discharge    Chronic HTN - controlled with lisinopril/HCTZ    HLD - lovastatin    Substance abuse (POA) - UDS +THC, cessation counseling    Nicotine dependence - cessation counseling, declined nicotine patch    Code status: Full  DVT prophylaxis: SCDs    Care Plan discussed with: Patient/Family, Nurse, Case Manager and Consultant Neurology, Patient Advocacy   Disposition: Home when cleared by Neurology     Hospital Problems  Date Reviewed: 02/23/2013          Codes Class Noted POA    History of craniotomy ICD-10-CM: Z98.890  ICD-9-CM: V45.89  01/17/2016 Unknown        Seizures (HCC) ICD-10-CM: R56.9  ICD-9-CM: 780.39  02/21/2013 Yes            Review of Systems:   Pertinent items are noted in HPI.     Vital Signs:    Last 24hrs VS reviewed since prior progress note. Most recent are:  Visit Vitals   ??? BP 103/56 (BP 1 Location: Left arm, BP Patient Position: At rest;Lying right side)   ??? Pulse 67   ??? Temp 97.8 ??F (36.6 ??C)   ??? Resp 12   ??? Ht 5\' 10"  (1.778 m)   ??? Wt 119.7 kg (263 lb 14.3 oz)   ??? SpO2 97%   ??? BMI 37.86 kg/m2     No intake or output data in the 24 hours ending 01/17/16 1634     Physical Examination:     Refused physical exam by me today.    Data Review:    Review and/or order of clinical lab test  Review and/or order of tests in the radiology section of CPT  Review and/or order of tests in the medicine section of CPT    Labs:     Recent Labs      01/17/16   0148  01/15/16   2059   WBC  5.9  7.8   HGB  14.5  15.2   HCT  41.7  44.6   PLT  244  285     Recent Labs      01/17/16   0148  01/15/16   2059   NA  137  139   K  3.6  4.2   CL  104  104   CO2  25  26   BUN  14  13   CREA  0.84  0.92   GLU  114*  96   CA  8.6  9.6   MG  2.2  2.5*     Recent Labs      01/15/16   2059   SGOT  25   ALT  51   AP  133*   TBILI  0.4   TP  8.7*   ALB  4.5   GLOB  4.2*     No results for input(s): INR, PTP, APTT in the last 72 hours.    No lab exists for component: INREXT, INREXT   No results for input(s): FE, TIBC, PSAT, FERR in the last 72 hours.   No results found for: FOL, RBCF   No results for input(s): PH, PCO2, PO2 in the last 72 hours.  Recent Labs      01/15/16   2059   CPK  159     Lab Results   Component Value Date/Time    Cholesterol, total 168 02/10/2013 12:52 AM    HDL Cholesterol 36 02/10/2013 12:52 AM    LDL, calculated 112.8 02/10/2013 12:52 AM     Triglyceride 96 02/10/2013 12:52 AM    CHOL/HDL Ratio 4.7 02/10/2013 12:52 AM     Lab Results   Component Value Date/Time    Glucose (POC) 95 01/17/2016 11:54 AM    Glucose (POC) 81 01/17/2016 07:50 AM    Glucose (POC) 127 01/16/2016 09:41 PM    Glucose (POC) 89 07/20/2015 11:57 PM    Glucose (POC) 86 09/30/2010 11:06 AM    Glucose (POC) 127 08/21/2010 09:24 PM    Glucose (POC) 124 07/28/2010 07:52 AM     Lab Results   Component Value Date/Time    Color DARK YELLOW 01/15/2016 09:21 PM    Appearance CLEAR 01/15/2016 09:21 PM    Specific gravity 1.025 01/15/2016 09:21 PM    Specific gravity 1.022 08/22/2010 12:45 PM    pH (UA) 6.0 01/15/2016 09:21 PM    Protein NEGATIVE  01/15/2016 09:21 PM    Glucose NEGATIVE  01/15/2016 09:21 PM    Ketone TRACE 01/15/2016 09:21 PM    Bilirubin NEGATIVE  07/19/2015 08:30 PM    Urobilinogen 1.0 01/15/2016 09:21 PM    Nitrites NEGATIVE  01/15/2016 09:21 PM    Leukocyte Esterase NEGATIVE  01/15/2016 09:21 PM    Epithelial cells FEW 01/15/2016 09:21 PM    Bacteria NEGATIVE  01/15/2016 09:21 PM    WBC 0-4 01/15/2016 09:21 PM    RBC 0-5 01/15/2016 09:21 PM     Medications Reviewed:     Current Facility-Administered Medications   Medication Dose Route Frequency   ??? cyclobenzaprine (FLEXERIL) tablet 10 mg  10 mg Oral TID PRN   ??? clonazePAM (KlonoPIN) tablet 1 mg  1 mg Oral TID   ??? FLUoxetine (PROzac) capsule 40 mg  40 mg Oral DAILY   ??? hydroCHLOROthiazide (HYDRODIURIL) tablet 25 mg  25 mg Oral DAILY   ???  lamoTRIgine (LaMICtal) tablet 200 mg  200 mg Oral BID   ??? levETIRAcetam (KEPPRA) tablet 1,500 mg  1,500 mg Oral BID   ??? lisinopril (PRINIVIL, ZESTRIL) tablet 20 mg  20 mg Oral DAILY   ??? lovastatin (MEVACOR) tablet 20 mg  20 mg Oral QHS   ??? acetaminophen (TYLENOL) tablet 650 mg  650 mg Oral Q6H PRN   ??? sodium chloride (NS) flush 5-10 mL  5-10 mL IntraVENous Q8H   ??? sodium chloride (NS) flush 5-10 mL  5-10 mL IntraVENous PRN   ??? enoxaparin (LOVENOX) injection 40 mg  40 mg SubCUTAneous Q24H      ______________________________________________________________________  EXPECTED LENGTH OF STAY: - - -  ACTUAL LENGTH OF STAY:          0                 Delon SacramentoSudha Meli Faley, MD

## 2016-01-17 NOTE — Progress Notes (Addendum)
Per CM covering unit, patient has contacted Medicare to dispute his potential discharge.  First of all, patient does not have a discharge order at this time, so he technically cannot dispute a discharge that does not exist.  Second, patient is in Observation status, and Observation stay discharges are not disputable.

## 2016-01-17 NOTE — Progress Notes (Signed)
Transition RN to the Bedside to assist Primary RN with patient education, medication educations, discharge instructions, and stroke core measure compliance. Discharge concerns initiated and discussed with patient, including clarification on "who" assists the patient at their home and instructions for when the home going patient should call their provider after discharge. Opportunity for questions and clarification was provided.      Patient receptive to education: YES  Patient stated: I've been on those medications for quite some time, I know what are they for and their side effects  Barriers to Education: none  Diagnosis Education given:  NO    Length of stay: 0  Expected Day of Discharge: tbd  Ask if they have "Help at Home" & add to white board?  NO    Education Day #: 1    Medication Education Given:  YES  M in the box Medication name: enoxaparin (not taking), hctz, keppra, clonazepam      Stroke Education documented in Patient Education: NO  Core Measures Documented in Connect Care:  Risk Factors: NO  Warning signs of stroke: NO  When to Activate 911: NO  Medication Education for Risk Factors: NO  Smoking cessation if applicable: NO  Written Education Given:  NO    Discharge NIH Completed: NO  Score: n/a    BRAINS: NO    Follow Up Appointment Made: NO  Date/Time if applicable: tbd

## 2016-01-17 NOTE — Progress Notes (Signed)
SUBJECTIVE  [name] is a 30 y.o. male with hx of seizures, craniotomy related to TBI in 2000.   On 4 anticonvulsants. Admitted after 7 GTC type seizures in 1 day.   Says he is compliant with meds.  Follows up with a neurologist in [place] where he lives.  Dilantin levels remains toxic despite holding it for 2 days.   Keppra level 0. Received 1500 mg BID yesterday.     EXAM  Exam:  Visit Vitals   ??? BP 140/72 (BP 1 Location: Right arm, BP Patient Position: At rest)   ??? Pulse 79   ??? Temp 98.2 ??F (36.8 ??C)   ??? Resp 18   ??? Ht 5\' 10"  (1.778 m)   ??? Wt 119.7 kg (263 lb 14.3 oz)   ??? SpO2 97%   ??? BMI 37.86 kg/m2     ZOX:WRUEAGen:Alert  CV: RRR  Lungs: non labored breathing  Abd: non distending  Neuro: A&O x 3, no dysarthria or aphasia. Horizontal nystagmus  CN II-XII: PERRL, EOMI, face symmetric, tongue/palate midline  Motor: strength 5/5 all four ext  Sensory: intact to LT  Gait: Mild unsteady    LABS/ IMAGING  Keppra level 0  Dilantin level 30  EEG R frontal slow    ASSESSMENT  Hospital Problems  Date Reviewed: 02/23/2013          Codes Class Noted POA    History of craniotomy ICD-10-CM: Z98.890  ICD-9-CM: V45.89  01/17/2016 Unknown        Seizures (HCC) ICD-10-CM: R56.9  ICD-9-CM: 780.39  02/21/2013 Yes             PLAN  Confusing scenario-Dilantin level went up today despite not getting any yesterday and Keppra level 0 despite being on 1500 mg per day.    Recommendation:    Continue to hold dilantin and repeat level tomorrow.If still remains elevated would discontinue it and start Trileptal 300 mg BID for 1 week and then 600 mg BID  If Keppra level again 0 tomorrow-would discontinue that as well and start Topiramate 50 mg BID for 1 week and then 100 mg  BID  Continue Lamotrigine 200 mg BID and follow levels tomorrow  We may be bale to DC him home tomorrow with this plan and he can f/u with his primary neurologist in [place]    Lance BoschAmandeep S Yarielis Funaro, MD      Follow up on L

## 2016-01-17 NOTE — Progress Notes (Signed)
Entered patients room due to patient wanting  to speak to nursing administration.  Pt complained that he was not ready to be discharged and that Dr Colletta MarylandKoduru was planning to and he wanted her fired and another hospitalitis to see him.  I spoke with patient and told him I would check on his plan and take it from there. I called patient advocacy to see patient.  Patient advocacy at bedside. Patient visible upset over MD care and wanting another MD.. Patient is also on phone with Medicare filing a dispute about discharge. Spoke with Dr Abelino DerrickIbe and informed of patients concerns he agreed to review chart.  Neur logy at bedside and agreed patient  should stay another night.  Myself and Carlena SaxBlair from Patient advocacy informed patient that Dr Abelino DerrickIbe was not here for the weekend. And that Dr Jenny ReichmannKodura was on call and still his MD. Patient was reluctant but agreed to stay with Neurology and Dr Griselda MinerKoduro.

## 2016-01-18 LAB — GLUCOSE, POC
Glucose (POC): 108 mg/dL — ABNORMAL HIGH (ref 65–100)
Glucose (POC): 97 mg/dL (ref 65–100)
Glucose (POC): 104 mg/dL — ABNORMAL HIGH (ref 65–100)

## 2016-01-18 LAB — CBC W/O DIFF
HCT: 41 % (ref 36.6–50.3)
HGB: 14.1 g/dL (ref 12.1–17.0)
MCH: 30.2 PG (ref 26.0–34.0)
MCHC: 34.4 g/dL (ref 30.0–36.5)
MCV: 87.8 FL (ref 80.0–99.0)
PLATELET: 216 10*3/uL (ref 150–400)
RBC: 4.67 M/uL (ref 4.10–5.70)
RDW: 12.9 % (ref 11.5–14.5)
WBC: 5.4 10*3/uL (ref 4.1–11.1)

## 2016-01-18 LAB — METABOLIC PANEL, BASIC
Anion gap: 7 mmol/L (ref 5–15)
BUN/Creatinine ratio: 14 (ref 12–20)
BUN: 10 MG/DL (ref 6–20)
CO2: 24 mmol/L (ref 21–32)
Calcium: 9 MG/DL (ref 8.5–10.1)
Chloride: 104 mmol/L (ref 97–108)
Creatinine: 0.69 MG/DL — ABNORMAL LOW (ref 0.70–1.30)
GFR est AA: >60 mL/min/{1.73_m2} (ref >60)
GFR est non-AA: >60 mL/min/{1.73_m2} (ref >60)
Glucose: 86 mg/dL (ref 65–100)
Potassium: 4 mmol/L (ref 3.5–5.1)
Sodium: 135 mmol/L — ABNORMAL LOW (ref 136–145)

## 2016-01-18 LAB — PHENYTOIN: Phenytoin: 29.2 ug/mL — CR (ref 10.0–20.0)

## 2016-01-18 LAB — CK: CK: 84 U/L (ref 39–308)

## 2016-01-18 MED FILL — CLONAZEPAM 1 MG TAB: 1 mg | ORAL | Qty: 1

## 2016-01-18 MED FILL — LAMOTRIGINE 100 MG TAB: 100 mg | ORAL | Qty: 2

## 2016-01-18 MED FILL — CYCLOBENZAPRINE 10 MG TAB: 10 mg | ORAL | Qty: 1

## 2016-01-18 MED FILL — LOVASTATIN 20 MG TAB: 20 mg | ORAL | Qty: 1

## 2016-01-18 MED FILL — BD POSIFLUSH NORMAL SALINE 0.9 % INJECTION SYRINGE: INTRAMUSCULAR | Qty: 10

## 2016-01-18 MED FILL — HYDROCHLOROTHIAZIDE 25 MG TAB: 25 mg | ORAL | Qty: 1

## 2016-01-18 MED FILL — LISINOPRIL 10 MG TAB: 10 mg | ORAL | Qty: 2

## 2016-01-18 MED FILL — FLUOXETINE 20 MG CAP: 20 mg | ORAL | Qty: 2

## 2016-01-18 MED FILL — LEVETIRACETAM 500 MG TAB: 500 mg | ORAL | Qty: 3

## 2016-01-18 NOTE — Progress Notes (Signed)
Bedside and Verbal shift change report given to Kennyth ArnoldStacy, Charity fundraiserN (oncoming nurse) by Vernona RiegerLaura, RN (offgoing nurse). Report included the following information SBAR, Kardex, MAR, Recent Results and Cardiac Rhythm NSR.

## 2016-01-18 NOTE — Other (Signed)
Bedside shift change report given to Laura, RN (oncoming nurse) by Stacy, RN (offgoing nurse). Report included the following information SBAR, MAR, Recent Results and Cardiac Rhythm NSR.

## 2016-01-18 NOTE — Progress Notes (Addendum)
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 He was placed in [facility] for breakthrough seizures and found to have supratherapeutic phenytoin level.     Interval history / Subjective:   Still c/o all over body aches; no seizures since before presentation to ED     Assessment & Plan:     Generalized body aches (POA)  - likely due to reported seizures  - check CK r/o rhabdo due to statin    Breakthrough seizures on chronic seizure disorder (POA)  - no seizures since presentation  - supratherapeutic phenytoin level; held on admission; has been supratherapeutic for a long time per our EMR; repeat level is greater than on admission despite not getting Dilantin but finally trending down  - continue Lamictal and Keppra; Keppra level <1.3 --> 0 despite being on 1500mg  po BID  - EEG 6/22 Mildly abnormal EEG with mild slowing seen in both the frontal head regions, right more than left  - Neuro following  - seizure precautions  - CT head wo contrast 6/21 right encephalomalacia, no acute  - reported seening Dr Loreta AveMann, [place] in [place], [place]; spoke to Dr Kenna GilbertMann's office on 01/17/16, patient has not been seen in the clinic, was last seen inpatient in 2012 and does NOT have a scheduled appointment coming up  - refusing to be discharged until "all my levels are therapeutic"    Supratherapeutic phenytoin level (POA) - phenytoin should not be resumed on discharge    Chronic HTN - controlled with lisinopril/HCTZ    HLD - lovastatin    Substance abuse (POA) - UDS +THC, cessation counseling     Nicotine dependence - cessation counseling, declined nicotine patch    Code status: Full  DVT prophylaxis: SCDs    Care Plan discussed with: Patient/Family, Nurse and Consultant Neurology  Disposition: Home when cleared by Neurology     Hospital Problems  Date Reviewed: 02/23/2013          Codes Class Noted POA    History of craniotomy ICD-10-CM: Z98.890  ICD-9-CM: V45.89  01/17/2016 Unknown        Seizures (HCC) ICD-10-CM: R56.9  ICD-9-CM: 780.39  02/21/2013 Yes            Review of Systems:   Pertinent items are noted in HPI.     Vital Signs:    Last 24hrs VS reviewed since prior progress note. Most recent are:  Visit Vitals   ??? BP 135/72 (BP 1 Location: Right arm, BP Patient Position: At rest)   ??? Pulse 84   ??? Temp 97.8 ??F (36.6 ??C)   ??? Resp 19   ??? Ht 5\' 10"  (1.778 m)   ??? Wt 119.7 kg (263 lb 14.3 oz)   ??? SpO2 98%   ??? BMI 37.86 kg/m2     No intake or output data in the 24 hours ending 01/18/16 [id number]     Physical Examination:     Constitutional:  awake, no acute distress, cooperative   ENT:  oral mucosa moist, oropharynx benign  Neck supple   Resp:  CTA bilaterally, no wheezing/rhonchi/rales   CV:  regular rhythm, normal rate, no m/r/g appreciated, no edema, +pulses    GI:  +BS, soft, non distended, non tender     Musculoskeletal:  moves all extremities    Neurologic:  AAOx3, NFD   Skin:  warm, dry  Eyes:  PERRL, dysconjugate gaze    Data Review:    Review and/or order of clinical lab test  Review and/or order of tests in the radiology section of CPT  Review and/or order of tests in the medicine section of CPT    Labs:     Recent Labs      01/18/16   0242  01/17/16   0148   WBC  5.4  5.9   HGB  14.1  14.5   HCT  41.0  41.7   PLT  216  244     Recent Labs      01/18/16   0242  01/17/16   0148  01/15/16   2059   NA  135*  137  139   K  4.0  3.6  4.2   CL  104  104  104   CO2  BUN  CREA  0.69*  0.84  0.92   GLU  86  114*  96   CA  9.0  8.6  9.6   MG   --   2.2  2.5*     Recent Labs      01/15/16    2059   SGOT  25   ALT  51   AP  133*   TBILI  0.4   TP  8.7*   ALB  4.5   GLOB  4.2*     No results for input(s): INR, PTP, APTT in the last 72 hours.    No lab exists for component: INREXT, INREXT   No results for input(s): FE, TIBC, PSAT, FERR in the last 72 hours.   No results found for: FOL, RBCF   No results for input(s): PH, PCO2, PO2 in the last 72 hours.  Recent Labs      01/15/16   2059   CPK  159     Lab Results   Component Value Date/Time    Cholesterol, total 168 02/10/2013 12:52 AM    HDL Cholesterol 36 02/10/2013 12:52 AM    LDL, calculated 112.8 02/10/2013 12:52 AM    Triglyceride 96 02/10/2013 12:52 AM    CHOL/HDL Ratio 4.7 02/10/2013 12:52 AM     Lab Results   Component Value Date/Time    Glucose (POC) 97 01/18/2016 07:39 AM    Glucose (POC) 108 01/17/2016 09:34 PM    Glucose (POC) 111 01/17/2016 04:37 PM    Glucose (POC) 95 01/17/2016 11:54 AM    Glucose (POC) 81 01/17/2016 07:50 AM     Lab Results   Component Value Date/Time    Color DARK YELLOW 01/15/2016 09:21 PM    Appearance CLEAR 01/15/2016 09:21 PM    Specific gravity 1.025 01/15/2016 09:21 PM    Specific gravity 1.022 08/22/2010 12:45 PM    pH (UA) 6.0 01/15/2016 09:21 PM    Protein NEGATIVE  01/15/2016 09:21 PM    Glucose NEGATIVE  01/15/2016 09:21 PM    Ketone TRACE 01/15/2016 09:21 PM    Bilirubin NEGATIVE  07/19/2015 08:30 PM    Urobilinogen 1.0 01/15/2016 09:21 PM    Nitrites  NEGATIVE  01/15/2016 09:21 PM    Leukocyte Esterase NEGATIVE  01/15/2016 09:21 PM    Epithelial cells FEW 01/15/2016 09:21 PM    Bacteria NEGATIVE  01/15/2016 09:21 PM    WBC 0-4 01/15/2016 09:21 PM    RBC 0-5 01/15/2016 09:21 PM     Medications Reviewed:     Current Facility-Administered Medications   Medication Dose Route Frequency   ??? cyclobenzaprine (FLEXERIL) tablet 10 mg  10 mg Oral TID PRN   ??? clonazePAM (KlonoPIN) tablet 1 mg  1 mg Oral TID   ??? FLUoxetine (PROzac) capsule 40 mg  40 mg Oral DAILY    ??? hydroCHLOROthiazide (HYDRODIURIL) tablet 25 mg  25 mg Oral DAILY   ??? lamoTRIgine (LaMICtal) tablet 200 mg  200 mg Oral BID   ??? levETIRAcetam (KEPPRA) tablet 1,500 mg  1,500 mg Oral BID   ??? lisinopril (PRINIVIL, ZESTRIL) tablet 20 mg  20 mg Oral DAILY   ??? lovastatin (MEVACOR) tablet 20 mg  20 mg Oral QHS   ??? acetaminophen (TYLENOL) tablet 650 mg  650 mg Oral Q6H PRN   ??? sodium chloride (NS) flush 5-10 mL  5-10 mL IntraVENous Q8H   ??? sodium chloride (NS) flush 5-10 mL  5-10 mL IntraVENous PRN     ______________________________________________________________________  EXPECTED LENGTH OF STAY: - - -  ACTUAL LENGTH OF STAY:          0                 [name] SacramentoSudha Raeya Merritts, MD

## 2016-01-18 NOTE — Consults (Signed)
Neurology Progress Note    Patient ID:  [name]  [id number]  30 y.o.  May 01, 1986    Chief Complaint: Seizures    Subjective:     30 year old gentleman in the hospital for breakthrough seizures seen by Dr. Lauree ChandlerSangha.  No seizures since admitted.  Phenytoin supratherapeutic trending down slowly today corrected at 31.  He continues to take Keppra and lamotrigine as well.  He feels tired today but no focal issues.    Objective:       ROS:  Per HPI  All other 12 pt ROS negative    Meds:  Current Facility-Administered Medications   Medication Dose Route Frequency   ??? cyclobenzaprine (FLEXERIL) tablet 10 mg  10 mg Oral TID PRN   ??? clonazePAM (KlonoPIN) tablet 1 mg  1 mg Oral TID   ??? FLUoxetine (PROzac) capsule 40 mg  40 mg Oral DAILY   ??? hydroCHLOROthiazide (HYDRODIURIL) tablet 25 mg  25 mg Oral DAILY   ??? lamoTRIgine (LaMICtal) tablet 200 mg  200 mg Oral BID   ??? levETIRAcetam (KEPPRA) tablet 1,500 mg  1,500 mg Oral BID   ??? lisinopril (PRINIVIL, ZESTRIL) tablet 20 mg  20 mg Oral DAILY   ??? lovastatin (MEVACOR) tablet 20 mg  20 mg Oral QHS   ??? acetaminophen (TYLENOL) tablet 650 mg  650 mg Oral Q6H PRN   ??? sodium chloride (NS) flush 5-10 mL  5-10 mL IntraVENous Q8H   ??? sodium chloride (NS) flush 5-10 mL  5-10 mL IntraVENous PRN       MRI Results (maximum last 3):  No results found for this or any previous visit.    Lab Review   Recent Results (from the past 24 hour(s))   GLUCOSE, POC    Collection Time: 01/17/16 11:54 AM   Result Value Ref Range    Glucose (POC) 95 65 - 100 mg/dL    Performed by Person Briane    GLUCOSE, POC    Collection Time: 01/17/16  4:37 PM   Result Value Ref Range    Glucose (POC) 111 (H) 65 - 100 mg/dL    Performed by Chad CordialHenley Dori    GLUCOSE, POC    Collection Time: 01/17/16  9:34 PM   Result Value Ref Range    Glucose (POC) 108 (H) 65 - 100 mg/dL    Performed by Darnelle SpangleHobbs Jataun (PCT)    PHENYTOIN    Collection Time: 01/18/16  2:42 AM   Result Value Ref Range     Phenytoin 29.2 (HH) 10.0 - 20.0 ug/mL   CBC W/O DIFF    Collection Time: 01/18/16  2:42 AM   Result Value Ref Range    WBC 5.4 4.1 - 11.1 K/uL    RBC 4.67 4.10 - 5.70 M/uL    HGB 14.1 12.1 - 17.0 g/dL    HCT 40.941.0 81.136.6 - 91.450.3 %    MCV 87.8 80.0 - 99.0 FL    MCH 30.2 26.0 - 34.0 PG    MCHC 34.4 30.0 - 36.5 g/dL    RDW 78.212.9 95.611.5 - 21.314.5 %    PLATELET 216 150 - 400 K/uL   METABOLIC PANEL, BASIC    Collection Time: 01/18/16  2:42 AM   Result Value Ref Range    Sodium 135 (L) 136 - 145 mmol/L    Potassium 4.0 3.5 - 5.1 mmol/L    Chloride 104 97 - 108 mmol/L    CO2 24 21 - 32 mmol/L  Anion gap 7 5 - 15 mmol/L    Glucose 86 65 - 100 mg/dL    BUN 10 6 - 20 MG/DL    Creatinine 3.080.69 (L) 0.70 - 1.30 MG/DL    BUN/Creatinine ratio 14 12 - 20      GFR est AA >60 >60 ml/min/1.3773m2    GFR est non-AA >60 >60 ml/min/1.7473m2    Calcium 9.0 8.5 - 10.1 MG/DL   CK    Collection Time: 01/18/16  2:42 AM   Result Value Ref Range    CK 84 39 - 308 U/L   GLUCOSE, POC    Collection Time: 01/18/16  7:39 AM   Result Value Ref Range    Glucose (POC) 97 65 - 100 mg/dL    Performed by Buford DresserForbes Nicolette        Additional comments:I reviewed the patient's new clinical lab test results.     Patient Vitals for the past 8 hrs:   BP Temp Pulse Resp SpO2   01/18/16 0954 131/72 - 74 - -   01/18/16 0800 - - - - 98 %   01/18/16 0700 135/72 97.8 ??F (36.6 ??C) 84 19 98 %   01/18/16 0300 122/58 98 ??F (36.7 ??C) 72 17 100 %               Exam:  Visit Vitals   ??? BP 131/72   ??? Pulse 74   ??? Temp 97.8 ??F (36.6 ??C)   ??? Resp 19   ??? Ht 5\' 10"  (1.778 m)   ??? Wt 119.7 kg (263 lb 14.3 oz)   ??? SpO2 98%   ??? BMI 37.86 kg/m2     Gen: Well developed  CV: RRR  Lungs: non labored breathing  Abd: soft, non distended  Neuro: A&O x 3, no dysarthria or aphasia  CN II-XII: Disconjugate gaze with right exotropia PERRL, some mild nystagmus bilaterally, face symmetric, tongue/palate midline  Motor: Symmetric spontaneous movement  Sensory: intact to LT  DTRs: symmetric   COOR: no limb/truncal ataxia  Gait: Deferred    PROBLEM LIST:     Patient Active Problem List   Diagnosis Code   ??? Adjustment disorder with depressed mood F43.21   ??? Borderline personality disorder F60.3   ??? Polysubstance overdose T50.901A   ??? Non-compliance with treatment Z91.19   ??? Malingering Z76.5   ??? Polysubstance dependence (HCC) F19.20   ??? 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 History of suicidal ideation Z86.59   ??? Psychosis F29   ??? Bipolar disorder with severe depression (HCC) F31.4   ??? Seizures (HCC) R56.9   ??? Dilantin toxicity T42.0X1A   ??? Bipolar disorder, unspecified (HCC) F31.9   ??? Accidental phenytoin poisoning T42.0X1A   ??? Unsteady gait R26.81   ??? History of craniotomy Z98.890       Assessment/Plan:      30 year old gentleman with a history of seizure disorder secondary to TBI in the hospital for seizure activity and also has concomitant supratherapeutic Dilantin level.  He does have some nystagmus on exam but unclear if this is his baseline.  He is not showing overt signs of worrisome toxicity.    Given that he is clinically stable without seizure activity and he is covered with multiple anticonvulsants I think is reasonable to begin discharge planning.  He would like to be discharged as of tomorrow which is appropriate.    I would not  resume Dilantin.  This will trend down slowly.  Continue lamotrigine at the current dosing as well as keppra which was resumed yesterday.Marland Kitchen.  He will follow[place]up [place] neurolog[place] in [place].    Signing off.  Please call with any questions.        Signed:  Shelia MediaSupakunya K Xane Amsden, DO  01/18/2016  10:51 AM

## 2016-01-18 NOTE — Other (Signed)
Results for [name], [name] (MRN [id number]) as of 01/18/2016 04:31   Ref. Range 01/18/2016 02:42   Phenytoin Latest Ref Range: 10.0 - 20.0 ug/mL 29.2 (HH)     Critical value reported by Lab contacted NP and advised of value. Orders to continue to monitor and hold Phenytoin.

## 2016-01-18 NOTE — Consults (Signed)
Neurology Progress Note    Patient ID:  [name]  [id number]  29 y.o.  1986/03/20    Chief Complaint: Seizures    Subjective:     30 year old gentleman in the hospital for breakthrough seizures seen by Dr. Lauree ChandlerSangha.  No seizures since admitted.  Phenytoin supratherapeutic trending down slowly today corrected at 29.  He continues to take Keppra and lamotrigine as well.  He feels tired today but no focal issues.    Objective:       ROS:  Per HPI  All other 12 pt ROS negative    Meds:  Current Facility-Administered Medications   Medication Dose Route Frequency   ??? cyclobenzaprine (FLEXERIL) tablet 10 mg  10 mg Oral TID PRN   ??? clonazePAM (KlonoPIN) tablet 1 mg  1 mg Oral TID   ??? FLUoxetine (PROzac) capsule 40 mg  40 mg Oral DAILY   ??? hydroCHLOROthiazide (HYDRODIURIL) tablet 25 mg  25 mg Oral DAILY   ??? lamoTRIgine (LaMICtal) tablet 200 mg  200 mg Oral BID   ??? levETIRAcetam (KEPPRA) tablet 1,500 mg  1,500 mg Oral BID   ??? lisinopril (PRINIVIL, ZESTRIL) tablet 20 mg  20 mg Oral DAILY   ??? lovastatin (MEVACOR) tablet 20 mg  20 mg Oral QHS   ??? acetaminophen (TYLENOL) tablet 650 mg  650 mg Oral Q6H PRN   ??? sodium chloride (NS) flush 5-10 mL  5-10 mL IntraVENous Q8H   ??? sodium chloride (NS) flush 5-10 mL  5-10 mL IntraVENous PRN       MRI Results (maximum last 3):  No results found for this or any previous visit.    Lab Review   Recent Results (from the past 24 hour(s))   GLUCOSE, POC    Collection Time: 01/17/16 11:54 AM   Result Value Ref Range    Glucose (POC) 95 65 - 100 mg/dL    Performed by Person Briane    GLUCOSE, POC    Collection Time: 01/17/16  4:37 PM   Result Value Ref Range    Glucose (POC) 111 (H) 65 - 100 mg/dL    Performed by Chad CordialHenley Dori    GLUCOSE, POC    Collection Time: 01/17/16  9:34 PM   Result Value Ref Range    Glucose (POC) 108 (H) 65 - 100 mg/dL    Performed by Darnelle SpangleHobbs Jataun (PCT)    PHENYTOIN    Collection Time: 01/18/16  2:42 AM   Result Value Ref Range    Phenytoin 29.2 (HH) 10.0 - 20.0  ug/mL   CBC W/O DIFF    Collection Time: 01/18/16  2:42 AM   Result Value Ref Range    WBC 5.4 4.1 - 11.1 K/uL    RBC 4.67 4.10 - 5.70 M/uL    HGB 14.1 12.1 - 17.0 g/dL    HCT 40.941.0 81.136.6 - 91.450.3 %    MCV 87.8 80.0 - 99.0 FL    MCH 30.2 26.0 - 34.0 PG    MCHC 34.4 30.0 - 36.5 g/dL    RDW 78.212.9 95.611.5 - 21.314.5 %    PLATELET 216 150 - 400 K/uL   METABOLIC PANEL, BASIC    Collection Time: 01/18/16  2:42 AM   Result Value Ref Range    Sodium 135 (L) 136 - 145 mmol/L    Potassium 4.0 3.5 - 5.1 mmol/L    Chloride 104 97 - 108 mmol/L    CO2 24 21 - 32 mmol/L  Anion gap 7 5 - 15 mmol/L    Glucose 86 65 - 100 mg/dL    BUN 10 6 - 20 MG/DL    Creatinine 1.61 (L) 0.70 - 1.30 MG/DL    BUN/Creatinine ratio 14 12 - 20      GFR est AA >60 >60 ml/min/1.66m2    GFR est non-AA >60 >60 ml/min/1.61m2    Calcium 9.0 8.5 - 10.1 MG/DL   CK    Collection Time: 01/18/16  2:42 AM   Result Value Ref Range    CK 84 39 - 308 U/L   GLUCOSE, POC    Collection Time: 01/18/16  7:39 AM   Result Value Ref Range    Glucose (POC) 97 65 - 100 mg/dL    Performed by Buford Dresser        Additional comments:I reviewed the patient's new clinical lab test results.     Patient Vitals for the past 8 hrs:   BP Temp Pulse Resp SpO2   01/18/16 0954 131/72 - 74 - -   01/18/16 0800 - - - - 98 %   01/18/16 0700 135/72 97.8 ??F (36.6 ??C) 84 19 98 %   01/18/16 0300 122/58 98 ??F (36.7 ??C) 72 17 100 %               Exam:  Visit Vitals   ??? BP 131/72   ??? Pulse 74   ??? Temp 97.8 ??F (36.6 ??C)   ??? Resp 19   ??? Ht  (1.778 m)   ??? Wt 119.7 kg (263 lb 14.3 oz)   ??? SpO2 98%   ??? BMI 37.86 kg/m2     Gen: Well developed  CV: RRR  Lungs: non labored breathing  Abd: soft, non distended  Neuro: A&O x 3, no dysarthria or aphasia  CN II-XII: Disconjugate gaze with right exotropia PERRL, some mild nystagmus bilaterally, face symmetric, tongue/palate midline  Motor: Symmetric spontaneous movement  Sensory: intact to LT  DTRs: symmetric  COOR: no limb/truncal ataxia  Gait:  Deferred    PROBLEM LIST:     Patient Active Problem List   Diagnosis Code   ??? Adjustment disorder with depressed mood F43.21   ??? Borderline personality disorder F60.3   ??? Polysubstance overdose T50.901A   ??? Non-compliance with treatment Z91.19   ??? Malingering Z76.5   ??? Polysubstance dependence (HCC) F19.20   ??? 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 History of suicidal ideation Z86.59   ??? Psychosis F29   ??? Bipolar disorder with severe depression (HCC) F31.4   ??? Seizures (HCC) R56.9   ??? Dilantin toxicity T42.0X1A   ??? Bipolar disorder, unspecified (HCC) F31.9   ??? Accidental phenytoin poisoning T42.0X1A   ??? Unsteady gait R26.81   ??? History of craniotomy Z98.890       Assessment/Plan:      30 year old gentleman with a history of seizure disorder secondary to TBI in the hospital for seizure activity and also has concomitant supratherapeutic Dilantin level.  He does have some nystagmus on exam but unclear if this is his baseline.  He is not showing overt signs of worrisome toxicity.    Given that he is clinically stable without seizure activity and he is covered with multiple anticonvulsants I think is reasonable to begin discharge planning.  He would like to be discharged as of tomorrow which is appropriate.    I would not  resume Dilantin.  This will trend down slowly.  Continue lamotrigine at the current dosing as well as keppra which was resumed yesterday.Marland Kitchen.  He will follow[place]up [place] in [place].    Signing off.  Please call with any questions.        Signed:  Shelia MediaSupakunya K Xane Amsden, DO  01/18/2016  10:51 AM

## 2016-01-19 LAB — PHENYTOIN, FREE PROFILE
Phenytoin free:total ratio: 6 %
Phenytoin, free: 1.7 ug/mL (ref 1.0–2.0)

## 2016-01-19 LAB — PHENYTOIN W RFLX FREE PHENYTOIN: Phenytoin: 26.4 ug/mL — CR (ref 10.0–20.0)

## 2016-01-19 LAB — LEVETIRACETAM (KEPPRA): Levetiracetam (Keppra): 17.1 ug/mL (ref 10.0–40.0)

## 2016-01-19 MED ORDER — LEVETIRACETAM 750 MG TAB
750 mg | ORAL_TABLET | Freq: Two times a day (BID) | ORAL | 0 refills | Status: DC
Start: 2016-01-19 — End: 2017-02-18

## 2016-01-19 MED ORDER — LAMOTRIGINE 200 MG TAB
200 mg | ORAL_TABLET | Freq: Two times a day (BID) | ORAL | 0 refills | Status: DC
Start: 2016-01-19 — End: 2017-02-18

## 2016-01-19 MED ORDER — CLONAZEPAM 1 MG TAB
1 mg | ORAL_TABLET | Freq: Three times a day (TID) | ORAL | 0 refills | Status: AC
Start: 2016-01-19 — End: ?

## 2016-01-19 MED ORDER — FLUOXETINE 40 MG CAP
40 mg | ORAL_CAPSULE | Freq: Every day | ORAL | 0 refills | Status: DC
Start: 2016-01-19 — End: 2016-04-23

## 2016-01-19 MED FILL — LISINOPRIL 10 MG TAB: 10 mg | ORAL | Qty: 2

## 2016-01-19 MED FILL — BD POSIFLUSH NORMAL SALINE 0.9 % INJECTION SYRINGE: INTRAMUSCULAR | Qty: 10

## 2016-01-19 MED FILL — LAMOTRIGINE 100 MG TAB: 100 mg | ORAL | Qty: 2

## 2016-01-19 MED FILL — LOVASTATIN 20 MG TAB: 20 mg | ORAL | Qty: 1

## 2016-01-19 MED FILL — FLUOXETINE 20 MG CAP: 20 mg | ORAL | Qty: 2

## 2016-01-19 MED FILL — HYDROCHLOROTHIAZIDE 25 MG TAB: 25 mg | ORAL | Qty: 1

## 2016-01-19 MED FILL — CLONAZEPAM 1 MG TAB: 1 mg | ORAL | Qty: 1

## 2016-01-19 MED FILL — LEVETIRACETAM 500 MG TAB: 500 mg | ORAL | Qty: 3

## 2016-01-19 NOTE — Discharge Summary (Addendum)
Discharge Summary       PATIENT ID: [name]  MRN: [id number]   DATE OF BIRTH: Jan 10, 1986    DATE OF ADMISSION: 01/15/2016  8:48 PM    DATE OF DISCHARGE: 01/19/16   PRIMARY CARE PROVIDER: Sol BlazingPhys Other, MD     ATTENDING PHYSICIAN: Delon SacramentoSudha Asmar Brozek, MD, MHS  DISCHARGING PROVIDER: Delon SacramentoSudha Grayling Schranz, MD, MHS    To contact this individual call [phone] and ask the operator to page.  If unavailable ask to be transferred the Adult Hospitalist Department.    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 He was placed in OBS NSTU for breakthrough seizures and found to have phenytoin toxicity.  ??  Generalized body aches (POA)  - likely due to reported seizures  - CK WNL  - resolved with prn Flexeril  ??  Breakthrough seizures on chronic seizure disorder (POA)  - no seizures since presentation  - supratherapeutic phenytoin level; held on admission; has been supratherapeutic for a long time per our EMR; repeat level is greater than on admission despite not getting Dilantin but finally trending down  - continue Lamictal and Keppra; Keppra level <1.3 --> 0 despite being on 1500mg  po BID  - EEG 6/22 Mildly abnormal EEG with mild slowing seen in both the frontal head regions, right more than left  - Neuro following  - seizure precautions  - CT head wo contrast 6/21 right encephalomalacia, no acute  - reported seen[place]ta AveMann[place] spoke to Dr Kenna GilbertMann's office on 01/17/16, patient has not been seen in the clinic, was last seen  inpatient in 2012 and does NOT have a scheduled appointment coming up  - advised patient he should never again take phenytoin  ??  Supratherapeutic phenytoin level (POA)   - asymptomatic  - phenytoin should not be resumed on discharge  - trending down; free phenytoin level WNL  ??  Chronic HTN - controlled with lisinopril/HCTZ  ??  HLD - lovastatin  ??  Substance abuse (POA) - UDS +THC, cessation counseling  ??  Nicotine dependence - cessation counseling, declined nicotine patch      DISCHARGE DIAGNOSES / PLAN:      Stable for discharge home to self-care. Follow up with Neurologist in [place]. Advised he should find and establish care with a PCP and Psychiatrist.       PENDING TEST RESULTS:   At the time of discharge the following test results are still pending: Keppra and Lamictal levels    FOLLOW UP APPOINTMENTS:    Follow-up Information     Follow up With Details Comments Contact Info    Dr Loreta AveMann, Neurologist  1 week or as scheduled following discharge from hospital            ADDITIONAL CARE RECOMMENDATIONS:   1. Take medications as prescribed.  2. Keep appointment with Neurologist as scheduled.  3. Dilantin has been stopped, and you should NOT take it again.  4. You should establish care under a PCP and Psychiatrist.  5. HCTZ has been stopped due to low blood  pressure and low potassium.     DIET: Cardiac Diet    ACTIVITY: activity as tolerated; no driving for at least 6 months    WOUND CARE: none    EQUIPMENT needed: none      DISCHARGE MEDICATIONS:  Current Discharge Medication List      CONTINUE these medications which have CHANGED    Details   lamoTRIgine (LAMICTAL) 200 mg tablet Take 1 Tab by mouth two (2) times a day. Indications: seizure disorder  Qty: 60 Tab, Refills: 0      levETIRAcetam (KEPPRA) 750 mg tablet Take 2 Tabs by mouth two (2) times a day. Indications: TONIC-CLONIC EPILEPSY TREATMENT ADJUNCT  Qty: 120 Tab, Refills: 0      FLUoxetine (PROZAC) 40 mg capsule Take 1 Cap by mouth daily.   Qty: 7 Cap, Refills: 0      clonazePAM (KLONOPIN) 1 mg tablet Take 1 Tab by mouth three (3) times daily. Max Daily Amount: 3 mg. Indications: MYOCLONIC EPILEPSY  Qty: 21 Tab, Refills: 0         CONTINUE these medications which have NOT CHANGED    Details   lovastatin (MEVACOR) 20 mg tablet Take 20 mg by mouth nightly.      lisinopril (PRINIVIL, ZESTRIL) 20 mg tablet Take 20 mg by mouth daily.         STOP taking these medications       PHENYTOIN SODIUM EXTENDED (DILANTIN PO) Comments:   Reason for Stopping:         hydrochlorothiazide (HYDRODIURIL) 25 mg tablet Comments:   Reason for Stopping:             NOTIFY YOUR PHYSICIAN FOR ANY OF THE FOLLOWING:   Fever over 101 degrees for 24 hours.   Chest pain, shortness of breath, fever, chills, nausea, vomiting, diarrhea, change in mentation, falling, weakness, bleeding. Severe pain or pain not relieved by medications.  Or, any other signs or symptoms that you may have questions about.    DISPOSITION:  x  Home With:   OT  PT  HH  RN       Long term SNF/Inpatient Rehab    Independent/assisted living    Hospice    Other:       PATIENT CONDITION AT DISCHARGE:     Functional status    Poor     Deconditioned    x Independent      Cognition   x  Lucid     Forgetful     Dementia      Catheters/lines (plus indication)    Foley     PICC     PEG    x None      Code status   x  Full code     DNR      PHYSICAL EXAMINATION AT DISCHARGE:  Visit Vitals   ??? BP 99/48 (BP 1 Location: Left arm)   ??? Pulse 88   ??? Temp 98 ??F (36.7 ??C)   ??? Resp 18   ??? Ht 5\' 10"  (1.778 m)   ??? Wt 119.7 kg (263 lb 14.3 oz)   ??? SpO2 98%   ??? BMI 37.86 kg/m2     Constitutional: ??awake, no acute distress, cooperative   ENT: ??oral mucosa moist, oropharynx benign  Neck supple   Resp: ??CTA bilaterally, no wheezing/rhonchi/rales   CV: ??regular rhythm, normal rate, no m/r/g appreciated, no edema, +pulses   ??GI: ??+BS, soft, non distended, non tender    ??  Musculoskeletal: ??moves all extremities   ??Neurologic: ??AAOx3, NFD    Skin: ??warm, dry  Eyes: ??PERRL, dysconjugate gaze    Labs  Recent Results (from the past 24 hour(s))   GLUCOSE, POC    Collection Time: 01/18/16 11:36 AM   Result Value Ref Range    Glucose (POC) 104 (H) 65 - 100 mg/dL    Performed by Lenna Gilfordhomas Earnisha    PHENYTOIN W RFLX FREE PHENYTOIN    Collection Time: 01/19/16  2:42 AM   Result Value Ref Range    Phenytoin 26.4 (HH) 10.0 - 20.0 ug/mL   PHENYTOIN, FREE PROFILE    Collection Time: 01/19/16  2:42 AM   Result Value Ref Range    Phenytoin, free 1.7 1.0 - 2.0 ug/mL    Phenytoin free:total ratio 6 %     Recent Labs   ????  01/18/16   0242  01/17/16   0148   WBC  5.4  5.9   HGB  14.1  14.5   HCT  41.0  41.7   PLT  216  244   ??        Recent Labs   ????  01/18/16   0242  01/17/16   0148  01/15/16   2059   NA  135*  137  139   K  4.0  3.6  4.2   CL  104  104  104   CO2  24  25  26    BUN  10  14  13    CREA  0.69*  0.84  0.92   GLU  86  114*  96   CA  9.0  8.6  9.6   MG   --   2.2  2.5*   ??      Recent Labs   ????  01/15/16   2059   SGOT  25   ALT  51   AP  133*   TBILI  0.4   TP  8.7*   ALB  4.5   GLOB  4.2*   ??  No results for input(s): INR, PTP, APTT in the last 72 hours.  ??  No lab exists for component: INREXT, INREXT   No results for input(s): FE, TIBC, PSAT, FERR in the last 72 hours.   No results found for: FOL, RBCF   No results for input(s): PH, PCO2, PO2 in the last 72 hours.      Recent Labs   ????  01/15/16   2059   CPK  159   ??    CHRONIC MEDICAL DIAGNOSES:  Problem List as of 01/19/2016  Date Reviewed: 01/19/2016          Codes Class Noted - Resolved    Marijuana use ICD-10-CM: F12.10  ICD-9-CM: 305.20  01/19/2016 - Present        Nicotine dependence (Chronic) ICD-10-CM: F17.200  ICD-9-CM: 305.1  01/19/2016 - Present        * (Principal)Symptomatic generalized epilepsy (HCC) ICD-10-CM: G40.409  ICD-9-CM: 345.90  01/18/2016 - Present        History of craniotomy ICD-10-CM: Z98.890  ICD-9-CM: V45.89  01/17/2016 - Present        Unsteady gait ICD-10-CM: R26.81   ICD-9-CM: 781.2  07/20/2015 - Present        Accidental phenytoin poisoning ICD-10-CM: T42.0X1A  ICD-9-CM: 966.1, E855.0  07/19/2015 - Present        Bipolar disorder, unspecified (HCC) ICD-10-CM: F31.9  ICD-9-CM: 296.80  04/13/2015 - Present  Dilantin toxicity ICD-10-CM: T42.0X1A  ICD-9-CM: 966.1, E980.4  04/02/2015 - Present        Bipolar disorder with severe depression (HCC) ICD-10-CM: F31.4  ICD-9-CM: 296.53  02/14/2013 - Present        History of suicidal ideation ICD-10-CM: Z86.59  ICD-9-CM: V11.8  02/11/2013 - Present        Psychosis ICD-10-CM: F29  ICD-9-CM: 298.9  02/11/2013 - Present        Localization-related (focal) (partial) epilepsy and epileptic syndromes with complex partial seizures, with intractable epilepsy ICD-10-CM: G40.219  ICD-9-CM: 345.41  01/25/2012 - Present        Non-compliance with treatment (Chronic) ICD-10-CM: Z91.19  ICD-9-CM: V15.81  10/01/2010 - Present        Malingering ICD-10-CM: Z76.5  ICD-9-CM: V65.2  10/01/2010 - Present    Overview Signed 10/01/2010  6:21 PM by Mackie Pai, MD     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             Polysubstance dependence (HCC) (Chronic) ICD-10-CM: O96.29  ICD-9-CM: 304.80  10/01/2010 - Present        Polysubstance overdose ICD-10-CM: T50.901A  ICD-9-CM: 977.9, E980.5 Chronic 08/22/2010 - Present        Adjustment disorder with depressed mood ICD-10-CM: F43.21  ICD-9-CM: 309.0  07/29/2010 - Present        Borderline personality disorder ICD-10-CM: F60.3  ICD-9-CM: 301.83  07/29/2010 - Present        RESOLVED: Seizures (HCC) ICD-10-CM: R56.9  ICD-9-CM: 780.39  02/21/2013 - 01/18/2016        RESOLVED: Opioid overdose ICD-10-CM: B28.4X3K  ICD-9-CM: 965.09, E980.0  02/10/2013 - 02/11/2013        RESOLVED: Narcotic overdose ICD-10-CM: T40.601A  ICD-9-CM: 967.9, E980.2  02/10/2013 - 02/11/2013              Signed:   Delon Sacramento, MD  01/19/2016  9:12 AM

## 2016-01-19 NOTE — Other (Signed)
IDR/SLIDR Summary          Patient: [name] MRN: [id number]    Age: 30 y.o.     Birthdate: 02-May-1986 Room/Bed: 650/01   Admit Diagnosis: Seizures (HCC)  Principal Diagnosis: <principal problem not specified>   Goals: pain management  Readmission: NO  Quality Measure: Not applicable  VTE Prophylaxis: Chemical  Influenza Vaccine screening completed? YES  Pneumococcal Vaccine screening completed? NO  Mobility needs: No   Nutrition plan:Yes  Consults:Case Management    Financial concerns:No  Escalated to CM? YES  RRAT Score: 23   Interventions:H2H  Testing due for pt today? NO  LOS: 0 days Expected length of stay 2 days  Discharge plan: tbd   PCP: Phys Other, MD  Transportation needs: No    Days before discharge:ready for discharge  Discharge disposition: Home    Signed:     Diamantina ProvidenceStacy L Carter  01/19/16  0114 am

## 2016-01-19 NOTE — Progress Notes (Signed)
I have reviewed discharge instructions with the patient.  The patient verbalized understanding. Patient declined prescription for keppra stating he has several months supply.  Patient was given a return to school note.  Belongings with patient.  Patient being discharged to home via bus voucher and [facility].

## 2016-01-19 NOTE — Progress Notes (Signed)
[facility]       January 19, 2016       RE: Shela CommonsChristopher Neil Mohabir      To Whom It May Concern:    This is to certify that Shela CommonsChristopher Neil Nabozny may return to school on Monday 01/20/16 or Tuesday 01/21/16. He was admitted to [facility] from 01/15/16 - 01/19/16 and should be excused from class(es).     Please feel free to contact my office at [phone] if you have any questions or concerns.  Thank you for your assistance in this matter.      Sincerely,      Delon SacramentoSudha Kineta Fudala, MD

## 2016-01-19 NOTE — Other (Signed)
Bedside shift change report given to Laura, RN (oncoming nurse) by Stacy, RN (offgoing nurse). Report included the following information SBAR, MAR, Recent Results and Cardiac Rhythm NSR.

## 2016-01-19 NOTE — Other (Addendum)
Results for [name], Javoris NEIL (MRN [id number]) as of 01/19/2016 05:40   Ref. Range 01/17/2016 21:34 01/18/2016 02:42 01/18/2016 07:39 01/18/2016 11:36 01/19/2016 02:42   Phenytoin Latest Ref Range: 10.0 - 20.0 ug/mL  29.2 (HH)   26.4 (HH)   Phenytoin, free Latest Ref Range: 1.0 - 2.0 ug/mL     1.7   Phenytoin free:total ratio Latest Units: %     6     Critical lab value received from oOng in lab, MD notified order to continue monitoring.

## 2016-01-19 NOTE — Discharge Summary (Signed)
Discharge  Summary by Delon Sacramento at 01/19/16 3295                Author: Delon Sacramento  Service: Internal Medicine  Author Type: Physician       Filed: 01/19/16 0940  Date of Service: 01/19/16 0912  Status: Addendum          Editor: Delon Sacramento          Related Notes: Original Note by Delon Sacramento filed at 01/19/16 0921                       Discharge Summary           PATIENT ID: [name]   MRN: [id number]     DATE OF BIRTH: 08-13-1985      DATE OF ADMISSION: 01/15/2016  8:48 PM      DATE OF DISCHARGE:  01/19/16    PRIMARY CARE PROVIDER:  Sol Blazing, MD       ATTENDING PHYSICIAN: Delon Sacramento, MD, MHS   DISCHARGING PROVIDER:  Delon Sacramento, MD, MHS     To contact this individual call [phone] and ask the operator to page.  If unavailable ask to be transferred the Adult Hospitalist Department.      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 He was placed in OBS NSTU for breakthrough seizures and found to have phenytoin toxicity.   ??   Generalized body aches (POA)   - likely due to reported seizures   - CK WNL   - resolved with prn Flexeril   ??   Breakthrough seizures on chronic seizure disorder (POA)   - no seizures since presentation   - supratherapeutic phenytoin level; held on admission; has been supratherapeutic for a long time per our EMR; repeat level is greater than on admission despite not getting Dilantin but finally  trending down   - continue Lamictal and Keppra; Keppra level <1.3 --> 0 despite being on 1500mg  po BID   - EEG 6/22 Mildly abnormal EEG  with mild slowing seen in both the frontal head regions, right more than left   - Neuro following   - seizure precautions   - CT head wo contrast 6/21 right encephalomalacia, no acute   - reported seening Dr Loreta Ave, neurologist in [place], ; spoke to Dr Kenna Gilbert office on 01/17/16, patient has not been seen in the clinic, was last seen inpatient in 2012 and does NOT have a scheduled  appointment coming up   - advised patient he should never again take phenytoin   ??   Supratherapeutic phenytoin level (POA)    - asymptomatic   - phenytoin should not be resumed on discharge   - trending down; free phenytoin level WNL   ??   Chronic HTN - controlled with lisinopril/HCTZ   ??   HLD - lovastatin   ??   Substance abuse (POA) - UDS +THC, cessation counseling   ??   Nicotine dependence - cessation counseling,  declined nicotine patch           DISCHARGE DIAGNOSES / P LAN:           Stable for discharge home to self-care. Follow up with Neurologist in [place]. Advised he should find and  establish care with a PCP and Psychiatrist.           PENDING TEST RESULTS:    At the time of discharge the following test results are still pending: Keppra and Lamictal levels      FOLLOW UP APPOINTMENTS:       Follow-up Information        Follow up With  Details  Comments  Contact Info             Dr Loreta Ave, Neurologist    1 week or as scheduled following discharge from hospital                   ADDITIONAL CARE RECOMMENDATIONS:    1. Take medications as prescribed.   2. Keep appointment with Neurologist as scheduled.   3. Dilantin has been stopped, and you should NOT take it again.   4. You should establish care under a PCP and Psychiatrist.   5. HCTZ has been stopped due to low blood pressure and low potassium.       DIET: Cardiac Diet      ACTIVITY: activity as tolerated; no driving for at least 6 months      WOUND CARE: none      EQUIPMENT needed: none         DISCHARGE MEDICATIONS:     Current Discharge Medication List              CONTINUE these  medications which have CHANGED          Details        lamoTRIgine (LAMICTAL) 200 mg tablet  Take 1 Tab by mouth two (2) times a day. Indications: seizure disorder   Qty: 60 Tab, Refills:  0               levETIRAcetam (KEPPRA) 750 mg tablet  Take 2 Tabs by mouth two (2) times a day. Indications: TONIC-CLONIC EPILEPSY TREATMENT ADJUNCT   Qty: 120 Tab, Refills:  0               FLUoxetine (PROZAC) 40 mg capsule  Take 1 Cap by mouth daily.   Qty: 7 Cap, Refills:  0               clonazePAM (KLONOPIN) 1 mg tablet  Take 1 Tab by mouth three (3) times daily. Max Daily Amount: 3 mg. Indications: MYOCLONIC EPILEPSY   Qty: 21 Tab, Refills:  0                     CONTINUE these medications which have NOT CHANGED          Details        lovastatin (MEVACOR) 20 mg tablet  Take 20 mg by mouth nightly.               lisinopril (PRINIVIL, ZESTRIL) 20 mg tablet  Take 20 mg by mouth daily.                     STOP taking these medications                  PHENYTOIN SODIUM EXTENDED (DILANTIN PO)  Comments:  Reason for Stopping:                      hydrochlorothiazide (HYDRODIURIL) 25 mg tablet  Comments:    Reason for Stopping:                          NOTIFY YOUR PHYSICIAN FOR ANY OF THE FOLLOWING:     Fever over 101 degrees for 24 hours.    Chest pain, shortness of breath, fever, chills, nausea, vomiting, diarrhea, change in mentation, falling, weakness, bleeding. Severe pain or pain not relieved by medications.   Or, any other signs or symptoms that you may have questions about.      DISPOSITION:      x   Home With:     OT    PT    HH    RN                   Long term SNF/Inpatient Rehab       Independent/assisted living          Hospice          Other:           PATIENT CONDITION AT DISCHARGE:       Functional status         Poor        Deconditioned         x  Independent         Cognition       x   Lucid        Forgetful           Dementia         Catheters/lines (plus indication)         Foley        PICC        PEG          x  None         Code status       x   Full code           DNR         PHYSICAL EXAMINATION AT DISCHARGE:     Visit Vitals         ?  BP  99/48 (BP 1 Location: Left arm)     ?  Pulse  88     ?  Temp  98 ??F (36.7 ??C)     ?  Resp  18     ?  Ht  5\' 10"  (1.778 m)     ?  Wt  119.7 kg (263 lb 14.3 oz)     ?  SpO2  98%         ?  BMI  37.86 kg/m2           Constitutional:  ??awake, no acute distress, cooperative     ENT:  ??oral mucosa moist, oropharynx benign   Neck supple     Resp:  ??CTA bilaterally, no wheezing/rhonchi/rales     CV:  ??regular rhythm, normal rate, no m/r/g appreciated, no edema, +pulses     ??GI:  ??+BS, soft, non distended, non tender      ??Musculoskeletal:  ??moves all extremities     ??Neurologic:  ??AAOx3, NFD     Skin: ??warm, dry   Eyes: ??PERRL, dysconjugate gaze      Labs     Recent  Results (from the past 24 hour(s))     GLUCOSE, POC          Collection Time: 01/18/16 11:36 AM         Result  Value  Ref Range            Glucose (POC)  104 (H)  65 - 100 mg/dL       Performed by  Lenna Gilfordhomas Earnisha         PHENYTOIN W RFLX FREE PHENYTOIN          Collection Time: 01/19/16  2:42 AM         Result  Value  Ref Range            Phenytoin  26.4 (HH)  10.0 - 20.0 ug/mL       PHENYTOIN, FREE PROFILE          Collection Time: 01/19/16  2:42 AM         Result  Value  Ref Range            Phenytoin, free  1.7  1.0 - 2.0 ug/mL            Phenytoin free:total ratio  6  %          Recent Labs         ????   01/18/16    0242   01/17/16    0148     WBC   5.4   5.9     HGB   14.1   14.5     HCT   41.0   41.7     PLT   216   244     ??                     Recent Labs          ????   01/18/16    0242   01/17/16    0148   01/15/16    2059     NA   135*   137   139     K   4.0   3.6   4.2     CL   104   104   104     CO2   24   25   26      BUN   10   14   13      CREA   0.69*   0.84   0.92     GLU   86   114*   96     CA   9.0   8.6   9.6     MG    --    2.2   2.5*     ??               Recent Labs        ????   01/15/16    2059     SGOT    25     ALT   51     AP   133*     TBILI   0.4     TP   8.7*     ALB   4.5     GLOB   4.2*     ??   No results for input(s): INR, PTP, APTT in the last 72 hours.   ??   No lab exists for component: INREXT, INREXT  No results for input(s): FE, TIBC, PSAT, FERR in the last 72 hours.    No results found for: FOL, RBCF    No results for input(s): PH, PCO2, PO2 in the last 72 hours.               Recent Labs        ????   01/15/16    2059     CPK   159     ??      CHRONIC MEDICAL DIAGNOSES:      Problem List as of 01/19/2016   Date Reviewed:  01/19/2016                Codes  Class  Noted - Resolved             Marijuana use  ICD-10-CM: F12.10   ICD-9-CM: 305.20    01/19/2016 - Present                       Nicotine dependence (Chronic)  ICD-10-CM: F17.200   ICD-9-CM: 305.1    01/19/2016 - Present                       * (Principal)Symptomatic generalized epilepsy (HCC)  ICD-10-CM: G40.409   ICD-9-CM: 345.90    01/18/2016 - Present                       History of craniotomy  ICD-10-CM: Z98.890   ICD-9-CM: V45.89    01/17/2016 - Present                       Unsteady gait  ICD-10-CM: R26.81   ICD-9-CM: 781.2    07/20/2015 - Present                       Accidental phenytoin poisoning  ICD-10-CM: T42.0X1A   ICD-9-CM: 966.1, E855.0    07/19/2015 - Present                       Bipolar disorder, unspecified (HCC)  ICD-10-CM: F31.9   ICD-9-CM: 296.80    04/13/2015 - Present                       Dilantin toxicity  ICD-10-CM: T42.0X1A   ICD-9-CM: 966.1, E980.4    04/02/2015 - Present                       Bipolar disorder with severe depression (HCC)  ICD-10-CM: F31.4   ICD-9-CM: 296.53    02/14/2013 - Present                       History of suicidal ideation  ICD-10-CM: Z86.59   ICD-9-CM: V11.8    02/11/2013 - Present                       Psychosis  ICD-10-CM: F29   ICD-9-CM: 298.9    02/11/2013 - Present                       Localization-related (focal) (partial) epilepsy and epileptic syndromes with complex partial seizures, with  intractable epilepsy  ICD-10-CM: G40.219   ICD-9-CM: 345.41    01/25/2012 - Present  Non-compliance with treatment (Chronic)  ICD-10-CM: Z91.19   ICD-9-CM: V15.81    10/01/2010 - Present                       Malingering  ICD-10-CM: Z76.5   ICD-9-CM: V65.2    10/01/2010 - Present          Overview Signed 10/01/2010  6:21 PM by Mackie Pai, MD            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                                   Polysubstance dependence (HCC) (Chronic)  ICD-10-CM: F19.20   ICD-9-CM: 304.80    10/01/2010 - Present                       Polysubstance overdose  ICD-10-CM: T50.901A   ICD-9-CM: 977.9, E980.5  Chronic  08/22/2010 - Present                       Adjustment disorder with depressed mood  ICD-10-CM: F43.21   ICD-9-CM: 309.0    07/29/2010 - Present                       Borderline personality disorder  ICD-10-CM: F60.3   ICD-9-CM: 301.83    07/29/2010 - Present                       RESOLVED: Seizures (HCC)  ICD-10-CM: R56.9   ICD-9-CM: 780.39    02/21/2013 - 01/18/2016                       RESOLVED: Opioid overdose  ICD-10-CM: Z61.0R6E   ICD-9-CM: 965.09, E980.0    02/10/2013 - 02/11/2013                       RESOLVED: Narcotic overdose  ICD-10-CM: T40.601A   ICD-9-CM: 967.9, E980.2    02/10/2013 - 02/11/2013                          Signed:    Delon Sacramento, MD   01/19/2016   9:12 AM

## 2016-01-20 LAB — LAMOTRIGINE (LAMICTAL): Lamotrigine: 2.2 ug/mL (ref 2.0–20.0)

## 2016-04-21 ENCOUNTER — Emergency Department: Admit: 2016-04-22 | Payer: MEDICARE

## 2016-04-21 DIAGNOSIS — M25512 Pain in left shoulder: Secondary | ICD-10-CM

## 2016-04-21 NOTE — ED Notes (Signed)
Pt complaining of left shoulder pain that starts in neck and radiates down arm. Pt rates pain 8/10. Pt has large dogs and unsure of possible injury. Pt in no acute distress at this time. Call bell within reach.

## 2016-04-22 ENCOUNTER — Inpatient Hospital Stay
Admit: 2016-04-22 | Discharge: 2016-04-22 | Disposition: A | Discharge status: Home / Self Care | Payer: MEDICARE | Attending: Emergency Medicine

## 2016-04-22 MED ORDER — NAPROXEN 500 MG TAB
500 mg | ORAL_TABLET | Freq: Two times a day (BID) | ORAL | 0 refills | Status: AC
Start: 2016-04-22 — End: 2016-05-02

## 2016-04-22 MED ORDER — CYCLOBENZAPRINE 10 MG TAB
10 mg | ORAL_TABLET | Freq: Three times a day (TID) | ORAL | 0 refills | Status: AC | PRN
Start: 2016-04-22 — End: ?

## 2016-04-22 MED ORDER — HYDROCODONE-ACETAMINOPHEN 5 MG-325 MG TAB
5-325 mg | ORAL_TABLET | Freq: Four times a day (QID) | ORAL | 0 refills | Status: DC | PRN
Start: 2016-04-22 — End: 2016-05-21

## 2016-04-22 MED ORDER — NAPROXEN 250 MG TAB
250 mg | Freq: Once | ORAL | Status: AC
Start: 2016-04-22 — End: 2016-04-22
  Administered 2016-04-22: 05:00:00 via ORAL

## 2016-04-22 MED ORDER — OXYCODONE-ACETAMINOPHEN 5 MG-325 MG TAB
5-325 mg | ORAL | Status: AC
Start: 2016-04-22 — End: 2016-04-22
  Administered 2016-04-22: 05:00:00 via ORAL

## 2016-04-22 MED FILL — NAPROXEN 250 MG TAB: 250 mg | ORAL | Qty: 2

## 2016-04-22 MED FILL — OXYCODONE-ACETAMINOPHEN 5 MG-325 MG TAB: 5-325 mg | ORAL | Qty: 1
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
## 2016-04-22 NOTE — ED Notes (Signed)
Discharge instructions given to patient by PA Tudor.  Verbalized understanding of instructions.  Patient discharged without difficulty.  Patient discharged in stable condition via ambulation accompanied by spouse.

## 2016-04-23 ENCOUNTER — Inpatient Hospital Stay: Payer: MEDICARE

## 2016-04-23 DIAGNOSIS — G40909 Epilepsy, unspecified, not intractable, without status epilepticus: Secondary | ICD-10-CM

## 2016-04-23 NOTE — Other (Signed)
TRANSFER - OUT REPORT:    Verbal report given to Stephanie(name) on Skeet SimmerChristopher Farrington  being transferred to [facility](unit) for routine progression of care       Report consisted of patient???s Situation, Background, Assessment and   Recommendations(SBAR).     Information from the following report(s) ED Summary was reviewed with the receiving nurse.    Lines:   Peripheral IV 04/23/16 Right Antecubital (Active)   Site Assessment Clean, dry, & intact 04/23/2016  8:35 PM   Phlebitis Assessment 0 04/23/2016  8:35 PM   Infiltration Assessment 0 04/23/2016  8:35 PM   Dressing Status Clean, dry, & intact 04/23/2016  8:35 PM   Dressing Type Transparent 04/23/2016  8:35 PM   Hub Color/Line Status Pink 04/23/2016  8:35 PM        Opportunity for questions and clarification was provided.      Patient transported with:   [employer]
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
## 2016-04-23 NOTE — Progress Notes (Signed)
TRANSFER - IN REPORT:    Verbal report received from Claris CheMargaret, RN (name) on [name]  being received from ED (unit) for routine progression of care      Report consisted of patient???s Situation, Background, Assessment and   Recommendations(SBAR).     Information from the following report(s) SBAR, Kardex, Intake/Output, MAR, Recent Results, Med Rec Status and Cardiac Rhythm NSR was reviewed with the receiving nurse.    Opportunity for questions and clarification was provided.      Assessment completed upon patient???s arrival to unit and care assumed.
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
## 2016-04-23 NOTE — ED Notes (Signed)
Called Pharmacy regarding Keppra and Dilantin - will send med now

## 2016-04-23 NOTE — ED Notes (Signed)
[name] SimmerChristopher [name] is a 30 y.o. male who is brought to the ER via EMS for reported seizures at home. Patient reports he knew he had a seizure at home but wasn't sure when. Friends at bedside report he told them to lay him down and then he began to "shake all over". They also report his eyes were closed; he had no vomiting; did not appear in any respiratory distress.

## 2016-04-23 NOTE — Progress Notes (Signed)
11:45 PM Patient arrived to room 3122 via stretcher. VS stable. Assessment complete. Patient with c/o left shoulder pain 9/10 and states, "Norco doesn't work for me. Can you page the on call doctor and ask for something else?" Dr. Alanson AlyAnis paged. Orders received for percocet. See MAR. Patient oriented to room and call bell system. Bed in lowest position. Call bell within reach. Will continue to monitor closely.    Primary Nurse Judd GaudierStephanie L Dove-Williams, RN and Brett CanalesSteve, RN performed a dual skin assessment on this patient No impairment noted  Braden score is 21

## 2016-04-24 ENCOUNTER — Inpatient Hospital Stay
Admit: 2016-04-24 | Discharge: 2016-04-24 | Discharge status: Against Medical Advice | Payer: MEDICARE | Attending: Emergency Medicine

## 2016-04-24 LAB — EKG 12-LEAD
Atrial Rate: 83 {beats}/min
Diagnosis: NORMAL
P Axis: 36 degrees
P-R Interval: 152 ms
Q-T Interval: 376 ms
QRS Duration: 98 ms
QTc Calculation (Bazett): 441 ms
R Axis: -11 degrees
T Axis: 6 degrees
Ventricular Rate: 83 {beats}/min

## 2016-04-24 LAB — CBC WITH AUTOMATED DIFF
ABS. BASOPHILS: 0 10*3/uL (ref 0.0–0.1)
ABS. BASOPHILS: 0 10*3/uL (ref 0.0–0.1)
ABS. EOSINOPHILS: 0.2 10*3/uL (ref 0.0–0.4)
ABS. EOSINOPHILS: 0.2 10*3/uL (ref 0.0–0.4)
ABS. LYMPHOCYTES: 2.1 10*3/uL (ref 0.8–3.5)
ABS. LYMPHOCYTES: 2.9 10*3/uL (ref 0.8–3.5)
ABS. MONOCYTES: 0.9 10*3/uL (ref 0.0–1.0)
ABS. MONOCYTES: 0.5 10*3/uL (ref 0.0–1.0)
ABS. NEUTROPHILS: 10.1 10*3/uL — ABNORMAL HIGH (ref 1.8–8.0)
ABS. NEUTROPHILS: 6.5 10*3/uL (ref 1.8–8.0)
BASOPHILS: 0 % (ref 0–1)
BASOPHILS: 0 % (ref 0–1)
EOSINOPHILS: 2 % (ref 0–7)
EOSINOPHILS: 2 % (ref 0–7)
HCT: 39.9 % (ref 36.6–50.3)
HCT: 39.4 % (ref 36.6–50.3)
HGB: 13.8 g/dL (ref 12.1–17.0)
HGB: 13.3 g/dL (ref 12.1–17.0)
LYMPHOCYTES: 15 % (ref 12–49)
LYMPHOCYTES: 28 % (ref 12–49)
MCH: 30.3 PG (ref 26.0–34.0)
MCH: 29.4 PG (ref 26.0–34.0)
MCHC: 34.6 g/dL (ref 30.0–36.5)
MCHC: 33.8 g/dL (ref 30.0–36.5)
MCV: 87.7 FL (ref 80.0–99.0)
MCV: 87.2 FL (ref 80.0–99.0)
MONOCYTES: 7 % (ref 5–13)
MONOCYTES: 5 % (ref 5–13)
NEUTROPHILS: 76 % — ABNORMAL HIGH (ref 32–75)
NEUTROPHILS: 65 % (ref 32–75)
PLATELET: 220 10*3/uL (ref 150–400)
PLATELET: 226 10*3/uL (ref 150–400)
RBC: 4.55 M/uL (ref 4.10–5.70)
RBC: 4.52 M/uL (ref 4.10–5.70)
RDW: 13.1 % (ref 11.5–14.5)
RDW: 13.3 % (ref 11.5–14.5)
WBC: 13.3 10*3/uL — ABNORMAL HIGH (ref 4.1–11.1)
WBC: 10.1 10*3/uL (ref 4.1–11.1)

## 2016-04-24 LAB — METABOLIC PANEL, COMPREHENSIVE
A-G Ratio: 1.6 (ref 1.1–2.2)
A-G Ratio: 1.3 (ref 1.1–2.2)
ALT (SGPT): 38 U/L (ref 12–78)
ALT (SGPT): 35 U/L (ref 12–78)
AST (SGOT): 18 U/L (ref 15–37)
AST (SGOT): 17 U/L (ref 15–37)
Albumin: 4 g/dL (ref 3.5–5.0)
Albumin: 3.5 g/dL (ref 3.5–5.0)
Alk. phosphatase: 91 U/L (ref 45–117)
Alk. phosphatase: 90 U/L (ref 45–117)
Anion gap: 6 mmol/L (ref 5–15)
Anion gap: 6 mmol/L (ref 5–15)
BUN/Creatinine ratio: 12 (ref 12–20)
BUN/Creatinine ratio: 15 (ref 12–20)
BUN: 11 MG/DL (ref 6–20)
BUN: 13 MG/DL (ref 6–20)
Bilirubin, total: 0.4 MG/DL (ref 0.2–1.0)
Bilirubin, total: 0.2 MG/DL (ref 0.2–1.0)
CO2: 26 mmol/L (ref 21–32)
CO2: 26 mmol/L (ref 21–32)
Calcium: 8.6 MG/DL (ref 8.5–10.1)
Calcium: 8.5 MG/DL (ref 8.5–10.1)
Chloride: 107 mmol/L (ref 97–108)
Chloride: 108 mmol/L (ref 97–108)
Creatinine: 0.94 MG/DL (ref 0.70–1.30)
Creatinine: 0.86 MG/DL (ref 0.70–1.30)
GFR est AA: >60 mL/min/{1.73_m2} (ref >60)
GFR est AA: >60 mL/min/{1.73_m2} (ref >60)
GFR est non-AA: >60 mL/min/{1.73_m2} (ref >60)
GFR est non-AA: >60 mL/min/{1.73_m2} (ref >60)
Globulin: 2.5 g/dL (ref 2.0–4.0)
Globulin: 2.7 g/dL (ref 2.0–4.0)
Glucose: 94 mg/dL (ref 65–100)
Glucose: 122 mg/dL — ABNORMAL HIGH (ref 65–100)
Potassium: 3.3 mmol/L — ABNORMAL LOW (ref 3.5–5.1)
Potassium: 3.7 mmol/L (ref 3.5–5.1)
Protein, total: 6.5 g/dL (ref 6.4–8.2)
Protein, total: 6.2 g/dL — ABNORMAL LOW (ref 6.4–8.2)
Sodium: 139 mmol/L (ref 136–145)
Sodium: 140 mmol/L (ref 136–145)

## 2016-04-24 LAB — MAGNESIUM
Magnesium: 2 mg/dL (ref 1.6–2.4)
Magnesium: 2.2 mg/dL (ref 1.6–2.4)

## 2016-04-24 LAB — EKG, 12 LEAD, INITIAL
Atrial Rate: 83 {beats}/min
Calculated P Axis: 36 degrees
Calculated R Axis: -11 degrees
Calculated T Axis: 6 degrees
Diagnosis: NORMAL
P-R Interval: 152 ms
Q-T Interval: 376 ms
QRS Duration: 98 ms
QTC Calculation (Bezet): 441 ms
Ventricular Rate: 83 {beats}/min

## 2016-04-24 LAB — ETHYL ALCOHOL: ALCOHOL(ETHYL),SERUM: <10 MG/DL (ref <10)

## 2016-04-24 LAB — PHOSPHORUS: Phosphorus: 4.3 MG/DL (ref 2.6–4.7)

## 2016-04-24 LAB — PHENYTOIN: Phenytoin: <0.5 ug/mL — ABNORMAL LOW (ref 10.0–20.0)

## 2016-04-24 MED ORDER — CLONAZEPAM 0.5 MG TAB
0.5 mg | Freq: Three times a day (TID) | ORAL | Status: DC
Start: 2016-04-24 — End: 2016-04-24
  Administered 2016-04-24 (×2): via ORAL

## 2016-04-24 MED ORDER — POTASSIUM CHLORIDE SR 10 MEQ TAB
10 mEq | ORAL | Status: AC
Start: 2016-04-24 — End: 2016-04-23
  Administered 2016-04-24: 03:00:00 via ORAL

## 2016-04-24 MED ORDER — PHENYTOIN SODIUM EXTENDED 100 MG CAP
100 mg | Freq: Three times a day (TID) | ORAL | Status: DC
Start: 2016-04-24 — End: 2016-04-24
  Administered 2016-04-24: 13:00:00 via ORAL

## 2016-04-24 MED ORDER — CYCLOBENZAPRINE 10 MG TAB
10 mg | Freq: Three times a day (TID) | ORAL | Status: DC | PRN
Start: 2016-04-24 — End: 2016-04-24
  Administered 2016-04-24: 04:00:00 via ORAL

## 2016-04-24 MED ORDER — OXYCODONE-ACETAMINOPHEN 5 MG-325 MG TAB
5-325 mg | ORAL | Status: DC | PRN
Start: 2016-04-24 — End: 2016-04-24
  Administered 2016-04-24: 13:00:00 via ORAL

## 2016-04-24 MED ORDER — HYDROCODONE-ACETAMINOPHEN 5 MG-325 MG TAB
5-325 mg | ORAL | Status: DC | PRN
Start: 2016-04-24 — End: 2016-04-24

## 2016-04-24 MED ORDER — LORAZEPAM 2 MG/ML IJ SOLN
2 mg/mL | INTRAMUSCULAR | Status: DC | PRN
Start: 2016-04-24 — End: 2016-04-24

## 2016-04-24 MED ORDER — SODIUM CHLORIDE 0.9 % IJ SYRG
INTRAMUSCULAR | Status: DC | PRN
Start: 2016-04-24 — End: 2016-04-24
  Administered 2016-04-24: 09:00:00 via INTRAVENOUS

## 2016-04-24 MED ORDER — PHENYTOIN SODIUM EXTENDED 100 MG CAP
100 mg | ORAL | Status: AC
Start: 2016-04-24 — End: 2016-04-23
  Administered 2016-04-24: 02:00:00 via ORAL

## 2016-04-24 MED ORDER — LEVETIRACETAM 500 MG TAB
500 mg | Freq: Two times a day (BID) | ORAL | Status: DC
Start: 2016-04-24 — End: 2016-04-24
  Administered 2016-04-24: 13:00:00 via ORAL

## 2016-04-24 MED ORDER — ENOXAPARIN 40 MG/0.4 ML SUB-Q SYRINGE
40 mg/0.4 mL | Freq: Every day | SUBCUTANEOUS | Status: DC
Start: 2016-04-24 — End: 2016-04-24

## 2016-04-24 MED ORDER — LISINOPRIL 20 MG TAB
20 mg | Freq: Every day | ORAL | Status: DC
Start: 2016-04-24 — End: 2016-04-24
  Administered 2016-04-24: 13:00:00 via ORAL

## 2016-04-24 MED ORDER — OXYCODONE 5 MG TAB
5 mg | ORAL | Status: AC
Start: 2016-04-24 — End: 2016-04-23
  Administered 2016-04-24: 02:00:00 via ORAL

## 2016-04-24 MED ORDER — LEVETIRACETAM 500 MG/5 ML IV SOLN
5005 mg/5 mL | Freq: Once | INTRAVENOUS | Status: AC
Start: 2016-04-24 — End: 2016-04-23
  Administered 2016-04-24: 02:00:00 via INTRAVENOUS

## 2016-04-24 MED ORDER — SODIUM CHLORIDE 0.9 % IJ SYRG
Freq: Three times a day (TID) | INTRAMUSCULAR | Status: DC
Start: 2016-04-24 — End: 2016-04-24
  Administered 2016-04-24 (×2): via INTRAVENOUS

## 2016-04-24 MED FILL — BD POSIFLUSH NORMAL SALINE 0.9 % INJECTION SYRINGE: INTRAMUSCULAR | Qty: 10

## 2016-04-24 MED FILL — LOVENOX 40 MG/0.4 ML SUBCUTANEOUS SYRINGE: 40 mg/0.4 mL | SUBCUTANEOUS | Qty: 0.4

## 2016-04-24 MED FILL — LEVETIRACETAM 500 MG TAB: 500 mg | ORAL | Qty: 3

## 2016-04-24 MED FILL — OXYCODONE-ACETAMINOPHEN 5 MG-325 MG TAB: 5-325 mg | ORAL | Qty: 1

## 2016-04-24 MED FILL — LISINOPRIL 20 MG TAB: 20 mg | ORAL | Qty: 1

## 2016-04-24 MED FILL — KEPPRA 500 MG/5 ML INTRAVENOUS SOLUTION: 500 mg/5 mL | INTRAVENOUS | Qty: 20

## 2016-04-24 MED FILL — OXYCODONE 5 MG TAB: 5 mg | ORAL | Qty: 1

## 2016-04-24 MED FILL — PHENYTOIN SODIUM EXTENDED 100 MG CAP: 100 mg | ORAL | Qty: 1

## 2016-04-24 MED FILL — CLONAZEPAM 0.5 MG TAB: 0.5 mg | ORAL | Qty: 2

## 2016-04-24 MED FILL — CYCLOBENZAPRINE 10 MG TAB: 10 mg | ORAL | Qty: 1

## 2016-04-24 MED FILL — POTASSIUM CHLORIDE SR 10 MEQ TAB: 10 mEq | ORAL | Qty: 2

## 2016-04-24 MED FILL — PHENYTOIN SODIUM EXTENDED 100 MG CAP: 100 mg | ORAL | Qty: 3

## 2016-04-24 NOTE — Progress Notes (Signed)
Hospitalist note    Got paged at 9:56am  To discharge patient who is impatient to leave.  He was admitted to observation for Seizures due to non compliance  Informed nurse that I am in the midst of discharging another patient and will  Come as soon as I can.    Called the floor at 10:20 am to clarify info about patient this patient as  I am starting  Discharge process only to be notified that he had left AMA at 10 :10  as he could not wait.    Reviewed chart and old chart.  Patient has been provided with a PCP and and a Neurology consult      Elwyn Ladealina Beauty Pless, MD

## 2016-04-24 NOTE — Progress Notes (Signed)
CM unable to do assessment, pt wanting to leave. CM briefly discussed with pt about paying for medications and pt verbalized he was able to afford his medications. Appointments made for a new PCP and Neurologist and informed pt of the dates and times. Pt's friend was driving pt home.     Lanice Schwabhristine M Breidenbaugh, RN  [phone]
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
## 2016-04-24 NOTE — Progress Notes (Signed)
Patient signed out AMA per D. [place].

## 2016-04-24 NOTE — Progress Notes (Signed)
A new PCP f/u has been scheduled with Dr Lorella Nimrodzajkowski for Nov 16 at 11;00am    A Neuro f/u has been scheduled with Dorena Bodoheryl Wood, NP for Nov 2 at 3;00pm

## 2016-04-24 NOTE — Progress Notes (Signed)
Spoke with patient , he is wanting to leave now . Neurologist has seen and cleared. CM provided a list of new PCP and attempted to get appointments made. Pt stated will do himself. Pt signing AMA and leaving. Won't wait for MD to come and see patient and discharge.

## 2016-04-24 NOTE — Progress Notes (Signed)
Initial Nutrition Assessment:    INTERVENTIONS/RECOMMENDATIONS:   ?? Meals/Snacks: General/healthful diet: Continue Regular diet    ASSESSMENT:   Patient medically noted for seizure. PMH for TBI, HTN, depression, and Bipolar disorder. MST reporting unsure of recent weight loss and not eating poorly PTA. Chart review shows a stable weight x 3 months and 27# weight gain over the past year. Receiving a regular diet. No nutrition concerns at this time. Encourage intake of meals.     Diet Order: Regular  % Eaten:  No data found.    Pertinent Medications: Reviewed Other: Keppra, Lisinopril, Dilantin   Pertinent Labs: Reviewed Other:  Food Allergies: None Other: Iodine, Fish, Shellfish   Last BM: 9/28   Active     Hyperactive  Hypoactive        Absent BS  Skin:     Intact    Incision   Breakdown   Other:    Anthropometrics:   Height: 5\' 9"  (175.3 cm) Weight: 121.2 kg (267 lb 1.6 oz)   IBW (%IBW): 72.6 kg (160 lb) ( ) UBW (%UBW):   (  %)   Last Weight Metrics:  Weight Loss Metrics 04/23/2016 04/21/2016 01/17/2016 07/19/2015 04/23/2015 04/01/2015 03/19/2015   Today's Wt 267 lb 1.6 oz 263 lb 14.3 oz 263 lb 14.3 oz 240 lb 240 lb 240 lb 213 lb   BMI 39.44 kg/m2 38.97 kg/m2 37.86 kg/m2 34.44 kg/m2 34.44 kg/m2 34.44 kg/m2 30.56 kg/m2   Some encounter information is confidential and restricted. Go to Review Flowsheets activity to see all data.       BMI: Body mass index is 39.44 kg/(m^2).    This BMI is indicative of:   Underweight    Normal    Overweight     Obesity    Extreme Obesity (BMI>40)     Estimated Nutrition Needs (Based on):   2302 Kcals/day (BMR (2155) x 1.3AF -500kcal) , 88 g (1.2 g/kg IBW) Protein  Carbohydrate: At Least 130 g/day  Fluids: 2300 mL/day (91ml/kcal)    Pt expected to meet estimated nutrient needs: Yes No    NUTRITION DIAGNOSES:   Problem:  No nutritional diagnosis at this time      Etiology: related to       Signs/Symptoms: as evidenced by        NUTRITION INTERVENTIONS:   Meals/Snacks: General/healthful diet                  GOAL:   PO intake >70% of meals next 5-7 days    LEARNING NEEDS (Diet, Food/Nutrient-Drug Interaction):     None Identified    Identified and Education Provided/Documented    Identified and Pt declined/was not appropriate     Cultureal, Religious, OR Ethnic Dietary Needs:     None Identified    Identified and Addressed      Interdisciplinary Care Plan Reviewed/Documented     Discharge Planning:  Regular diet     MONITORING /EVALUATION:   Behavioral-Environmental Outcomes: Behavior  Food/Nutrient Intake Outcomes: Total energy intake  Physical Signs/Symptoms Outcomes: Weight/weight change    NUTRITION RISK:     High               Moderate             Low    Minimal/Uncompromised    PT SEEN FOR:      MD Consult: Calorie Count      Diabetic Diet Education        Diet  Education     Electrolyte Management     General Nutrition Management and Supplements     Management of Tube Feeding     TPN Recommendations      RN Referral:  MST score >=2     Enteral/Parenteral Nutrition PTA     Pregnant: Gestational DM or Multigestation     Pressure Ulcer/Wound Care needs          Low BMI    DTR Referral       Rae Roam  Pager [phone]                 Weekend Pager [phone]
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
## 2016-05-04 ENCOUNTER — Emergency Department: Admit: 2016-05-05 | Payer: MEDICARE

## 2016-05-04 DIAGNOSIS — G40409 Other generalized epilepsy and epileptic syndromes, not intractable, without status epilepticus: Principal | ICD-10-CM

## 2016-05-04 NOTE — ED Notes (Signed)
Patient experienced a tonic clonic seizure last less than 10 seconds. Dr Lourdes SledgeKnorr notified and at bedside, med with Ativan

## 2016-05-04 NOTE — ED Notes (Signed)
Dr Lourdes SledgeKnorr in to speak with patient, med for pain. Patient stated, "That won't do anything."
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
## 2016-05-04 NOTE — ED Notes (Signed)
Dr Allena KatzPatel in to examine. Admission orders received, med for pain. Friend at bedside
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
## 2016-05-04 NOTE — ED Notes (Signed)
Patient complaining of chronic left shoulder pain, asking to speak to ERMD. Dr Lourdes SledgeKnorr notified, will speak with patient.

## 2016-05-05 ENCOUNTER — Inpatient Hospital Stay
Admit: 2016-05-05 | Discharge: 2016-05-05 | Discharge status: Against Medical Advice | Payer: MEDICARE | Attending: Internal Medicine | Admitting: Internal Medicine

## 2016-05-05 LAB — CBC WITH AUTOMATED DIFF
ABS. BASOPHILS: 0 10*3/uL (ref 0.0–0.1)
ABS. EOSINOPHILS: 0.3 10*3/uL (ref 0.0–0.4)
ABS. LYMPHOCYTES: 3.2 10*3/uL (ref 0.8–3.5)
ABS. MONOCYTES: 0.5 10*3/uL (ref 0.0–1.0)
ABS. NEUTROPHILS: 4.1 10*3/uL (ref 1.8–8.0)
BASOPHILS: 0 % (ref 0–1)
EOSINOPHILS: 3 % (ref 0–7)
HCT: 42.4 % (ref 36.6–50.3)
HGB: 14.7 g/dL (ref 12.1–17.0)
LYMPHOCYTES: 40 % (ref 12–49)
MCH: 30.4 PG (ref 26.0–34.0)
MCHC: 34.7 g/dL (ref 30.0–36.5)
MCV: 87.8 FL (ref 80.0–99.0)
MONOCYTES: 6 % (ref 5–13)
NEUTROPHILS: 51 % (ref 32–75)
PLATELET: 199 10*3/uL (ref 150–400)
RBC: 4.83 M/uL (ref 4.10–5.70)
RDW: 13.2 % (ref 11.5–14.5)
WBC: 8.1 10*3/uL (ref 4.1–11.1)

## 2016-05-05 LAB — METABOLIC PANEL, COMPREHENSIVE
A-G Ratio: 1.2 (ref 1.1–2.2)
ALT (SGPT): 46 U/L (ref 12–78)
AST (SGOT): 23 U/L (ref 15–37)
Albumin: 3.8 g/dL (ref 3.5–5.0)
Alk. phosphatase: 103 U/L (ref 45–117)
Anion gap: 9 mmol/L (ref 5–15)
BUN/Creatinine ratio: 10 — ABNORMAL LOW (ref 12–20)
BUN: 9 MG/DL (ref 6–20)
Bilirubin, total: 0.2 MG/DL (ref 0.2–1.0)
CO2: 24 mmol/L (ref 21–32)
Calcium: 8.8 MG/DL (ref 8.5–10.1)
Chloride: 104 mmol/L (ref 97–108)
Creatinine: 0.86 MG/DL (ref 0.70–1.30)
GFR est AA: >60 mL/min/{1.73_m2} (ref >60)
GFR est non-AA: >60 mL/min/{1.73_m2} (ref >60)
Globulin: 3.2 g/dL (ref 2.0–4.0)
Glucose: 139 mg/dL — ABNORMAL HIGH (ref 65–100)
Potassium: 3.4 mmol/L — ABNORMAL LOW (ref 3.5–5.1)
Protein, total: 7 g/dL (ref 6.4–8.2)
Sodium: 137 mmol/L (ref 136–145)

## 2016-05-05 LAB — PHENYTOIN: Phenytoin: 0.7 ug/mL — ABNORMAL LOW (ref 10.0–20.0)

## 2016-05-05 LAB — URINALYSIS W/ REFLEX CULTURE
Bacteria: NEGATIVE /hpf
Bilirubin: NEGATIVE
Blood: NEGATIVE
Glucose: NEGATIVE mg/dL
Ketone: NEGATIVE mg/dL
Leukocyte Esterase: NEGATIVE
Nitrites: NEGATIVE
Protein: NEGATIVE mg/dL
Specific gravity: 1.018 (ref 1.003–1.030)
Urobilinogen: 0.2 EU/dL (ref 0.2–1.0)
pH (UA): 6 (ref 5.0–8.0)

## 2016-05-05 LAB — DRUG SCREEN, URINE
AMPHETAMINES: NEGATIVE
BARBITURATES: NEGATIVE
BENZODIAZEPINES: POSITIVE — AB
COCAINE: NEGATIVE
METHADONE: NEGATIVE
OPIATES: NEGATIVE
PCP(PHENCYCLIDINE): NEGATIVE
THC (TH-CANNABINOL): POSITIVE — AB

## 2016-05-05 LAB — CK W/ CKMB & INDEX
CK - MB: 1.1 NG/ML (ref <3.6)
CK-MB Index: 0.9 (ref 0–2.5)
CK: 118 U/L (ref 39–308)

## 2016-05-05 LAB — LACTIC ACID: Lactic acid: 0.9 MMOL/L (ref 0.4–2.0)

## 2016-05-05 LAB — ETHYL ALCOHOL: ALCOHOL(ETHYL),SERUM: <10 MG/DL (ref <10)

## 2016-05-05 MED ORDER — CYCLOBENZAPRINE 10 MG TAB
10 mg | Freq: Three times a day (TID) | ORAL | Status: DC | PRN
Start: 2016-05-05 — End: 2016-05-05

## 2016-05-05 MED ORDER — PHENYTOIN SODIUM EXTENDED 100 MG CAP
100 mg | Freq: Three times a day (TID) | ORAL | Status: DC
Start: 2016-05-05 — End: 2016-05-05

## 2016-05-05 MED ORDER — LEVETIRACETAM 500 MG/5 ML IV SOLN
5005 mg/5 mL | INTRAVENOUS | Status: AC
Start: 2016-05-05 — End: 2016-05-04
  Administered 2016-05-05: 03:00:00 via INTRAVENOUS

## 2016-05-05 MED ORDER — ONDANSETRON (PF) 4 MG/2 ML INJECTION
4 mg/2 mL | INTRAMUSCULAR | Status: DC | PRN
Start: 2016-05-05 — End: 2016-05-05

## 2016-05-05 MED ORDER — MORPHINE 2 MG/ML INJECTION
2 mg/mL | INTRAMUSCULAR | Status: DC | PRN
Start: 2016-05-05 — End: 2016-05-05
  Administered 2016-05-05: 05:00:00 via INTRAVENOUS

## 2016-05-05 MED ORDER — CYCLOBENZAPRINE 10 MG TAB
10 mg | Freq: Three times a day (TID) | ORAL | Status: DC | PRN
Start: 2016-05-05 — End: 2016-05-05
  Administered 2016-05-05: 06:00:00 via ORAL

## 2016-05-05 MED ORDER — SODIUM CHLORIDE 0.9 % IJ SYRG
Freq: Three times a day (TID) | INTRAMUSCULAR | Status: DC
Start: 2016-05-05 — End: 2016-05-05

## 2016-05-05 MED ORDER — POTASSIUM CHLORIDE SR 10 MEQ TAB
10 mEq | ORAL | Status: AC
Start: 2016-05-05 — End: 2016-05-05
  Administered 2016-05-05: 06:00:00 via ORAL

## 2016-05-05 MED ORDER — CLONAZEPAM 1 MG TAB
1 mg | Freq: Three times a day (TID) | ORAL | Status: DC
Start: 2016-05-05 — End: 2016-05-05

## 2016-05-05 MED ORDER — LAMOTRIGINE 100 MG TAB
100 mg | Freq: Two times a day (BID) | ORAL | Status: DC
Start: 2016-05-05 — End: 2016-05-05

## 2016-05-05 MED ORDER — PRAVASTATIN 10 MG TAB
10 mg | Freq: Every evening | ORAL | Status: DC
Start: 2016-05-05 — End: 2016-05-05
  Administered 2016-05-05: 06:00:00 via ORAL

## 2016-05-05 MED ORDER — LISINOPRIL 20 MG TAB
20 mg | Freq: Every day | ORAL | Status: DC
Start: 2016-05-05 — End: 2016-05-05

## 2016-05-05 MED ORDER — DIPHENHYDRAMINE HCL 50 MG/ML IJ SOLN
50 mg/mL | INTRAMUSCULAR | Status: DC | PRN
Start: 2016-05-05 — End: 2016-05-05

## 2016-05-05 MED ORDER — ENOXAPARIN 40 MG/0.4 ML SUB-Q SYRINGE
40 mg/0.4 mL | SUBCUTANEOUS | Status: DC
Start: 2016-05-05 — End: 2016-05-05

## 2016-05-05 MED ORDER — LORAZEPAM 2 MG/ML IJ SOLN
2 mg/mL | INTRAMUSCULAR | Status: AC
Start: 2016-05-05 — End: 2016-05-04
  Administered 2016-05-05: 02:00:00 via INTRAVENOUS

## 2016-05-05 MED ORDER — OXYCODONE-ACETAMINOPHEN 5 MG-325 MG TAB
5-325 mg | Freq: Four times a day (QID) | ORAL | Status: DC | PRN
Start: 2016-05-05 — End: 2016-05-05

## 2016-05-05 MED ORDER — NALOXONE 0.4 MG/ML INJECTION
0.4 mg/mL | INTRAMUSCULAR | Status: DC | PRN
Start: 2016-05-05 — End: 2016-05-05

## 2016-05-05 MED ORDER — HYDROMORPHONE (PF) 1 MG/ML IJ SOLN
1 mg/mL | Freq: Once | INTRAMUSCULAR | Status: AC
Start: 2016-05-05 — End: 2016-05-05
  Administered 2016-05-05: 06:00:00 via INTRAVENOUS

## 2016-05-05 MED ORDER — SODIUM CHLORIDE 0.9% BOLUS IV
0.9 % | INTRAVENOUS | Status: AC
Start: 2016-05-05 — End: 2016-05-04
  Administered 2016-05-05: 02:00:00 via INTRAVENOUS

## 2016-05-05 MED ORDER — KETOROLAC TROMETHAMINE 30 MG/ML INJECTION
30 mg/mL (1 mL) | INTRAMUSCULAR | Status: AC
Start: 2016-05-05 — End: 2016-05-05
  Administered 2016-05-05: 04:00:00 via INTRAVENOUS

## 2016-05-05 MED ORDER — LORAZEPAM 2 MG/ML IJ SOLN
2 mg/mL | INTRAMUSCULAR | Status: DC | PRN
Start: 2016-05-05 — End: 2016-05-05

## 2016-05-05 MED ORDER — LEVETIRACETAM 500 MG/5 ML IV SOLN
5005 mg/5 mL | INTRAVENOUS | Status: DC
Start: 2016-05-05 — End: 2016-05-05

## 2016-05-05 MED ORDER — LEVETIRACETAM 500 MG TAB
500 mg | Freq: Two times a day (BID) | ORAL | Status: DC
Start: 2016-05-05 — End: 2016-05-05

## 2016-05-05 MED ORDER — SODIUM CHLORIDE 0.9 % IJ SYRG
INTRAMUSCULAR | Status: DC | PRN
Start: 2016-05-05 — End: 2016-05-05

## 2016-05-05 MED FILL — KEPPRA 500 MG/5 ML INTRAVENOUS SOLUTION: 500 mg/5 mL | INTRAVENOUS | Qty: 10

## 2016-05-05 MED FILL — PRAVASTATIN 10 MG TAB: 10 mg | ORAL | Qty: 2

## 2016-05-05 MED FILL — HYDROMORPHONE (PF) 1 MG/ML IJ SOLN: 1 mg/mL | INTRAMUSCULAR | Qty: 1

## 2016-05-05 MED FILL — POTASSIUM CHLORIDE SR 10 MEQ TAB: 10 mEq | ORAL | Qty: 2

## 2016-05-05 MED FILL — LORAZEPAM 2 MG/ML IJ SOLN: 2 mg/mL | INTRAMUSCULAR | Qty: 1

## 2016-05-05 MED FILL — MORPHINE 2 MG/ML INJECTION: 2 mg/mL | INTRAMUSCULAR | Qty: 1

## 2016-05-05 MED FILL — PRAVASTATIN 20 MG TAB: 20 mg | ORAL | Qty: 1

## 2016-05-05 MED FILL — BD POSIFLUSH NORMAL SALINE 0.9 % INJECTION SYRINGE: INTRAMUSCULAR | Qty: 10

## 2016-05-05 MED FILL — CYCLOBENZAPRINE 10 MG TAB: 10 mg | ORAL | Qty: 1

## 2016-05-05 MED FILL — SODIUM CHLORIDE 0.9 % IV: INTRAVENOUS | Qty: 1000

## 2016-05-05 MED FILL — PHENYTOIN SODIUM EXTENDED 100 MG CAP: 100 mg | ORAL | Qty: 1

## 2016-05-05 MED FILL — KETOROLAC TROMETHAMINE 30 MG/ML INJECTION: 30 mg/mL (1 mL) | INTRAMUSCULAR | Qty: 1

## 2016-05-05 NOTE — Discharge Summary (Signed)
Pt eloped from the floor room 3101 after being admitted by me in ER.  Pt apparently got RN on floor to get Tele hospitalist to order 1 dose of Dilaudid before he pulled out his IV & removed his tele monitor & left with his friend.  Pt demonstrated pain medication seeking behavior during this admission.  Doubt pt had seizures now (chief complain) as pt's blood work now reveals normal CK 118 & lact= 0.9 despite the history of multiple Tonic clonic seizures pt presented with & 1 witnessed GTC seizure in ER.

## 2016-05-05 NOTE — Progress Notes (Signed)
Pt complaining of pain rating it 10 out of 10 said it was so painful he could not answer my questions(was given 1 mg of morphine in ER)time elapsed one hour . Offered pt percocet stated it doesn't work for him. Dr Kathreen DevoidLapetina called obtain 1 x dose of Dilaudid. VSS, wt 264 lbs given potassium , flexeril and pravastatin left out of room to answer a call light when I returned pt was gone. Tele box found in elevator saline lock found in trash can. Security notified nursing supervisor notified pt left AMA. Called the numbers on file no answer.  Dr Allena KatzPatel informed

## 2016-05-05 NOTE — Discharge Summary (Signed)
Pt eloped from the floor room 3101 after being admitted by me in ER.  Pt apparently got RN on floor to get Tele hospitalist to order 1 dose of Dilaudid before he pulled out his IV & removed his tele monitor & left with his friend.  Pt demonstrated pain medication seeking behavior during this admission.  Doubt pt had seizures now (chief complain) as pt's blood work now reveals normal CK 118 & lact= 0.9 despite the history of multiple Tonic clonic seizures pt presented with & 1 witnessed GTC seizure in ER.

## 2016-05-05 NOTE — Progress Notes (Signed)
Male called to unit, stated she was calling regarding [name], would not identify herself.  Stated that [name] was home, left because he was "leary of what they were doing to him", and that he had a doctor's appointment made.  Stated he was safe.

## 2016-05-05 NOTE — Other (Signed)
..  TRANSFER - IN REPORT:    Verbal report received from Darl PikesSusan RN  on Skeet SimmerChristopher Benecke  being received from The ED for routine progression of care      Report consisted of patient???s Situation, Background, Assessment and   Recommendations(SBAR).     Information from the following report(s) SBAR and ED Summary was reviewed with the receiving nurse.    Opportunity for questions and clarification was provided.      Assessment completed upon patient???s arrival to unit and care assumed.

## 2016-05-05 NOTE — Other (Signed)
Telephone report given to Selena BattenKim, RN patient transported to room 3101 via stretcher

## 2016-05-07 ENCOUNTER — Inpatient Hospital Stay
Admit: 2016-05-07 | Discharge: 2016-05-07 | Discharge status: Against Medical Advice | Payer: MEDICARE | Attending: Emergency Medicine

## 2016-05-07 DIAGNOSIS — G40802 Other epilepsy, not intractable, without status epilepticus: Secondary | ICD-10-CM

## 2016-05-07 DIAGNOSIS — G40909 Epilepsy, unspecified, not intractable, without status epilepticus: Secondary | ICD-10-CM

## 2016-05-07 LAB — EKG 12-LEAD
Atrial Rate: 67 {beats}/min
Diagnosis: NORMAL
P Axis: 33 degrees
P-R Interval: 170 ms
Q-T Interval: 430 ms
QRS Duration: 106 ms
QTc Calculation (Bazett): 454 ms
R Axis: -3 degrees
T Axis: -5 degrees
Ventricular Rate: 67 {beats}/min

## 2016-05-07 LAB — CBC WITH AUTOMATED DIFF
ABS. BASOPHILS: 0 10*3/uL (ref 0.0–0.1)
ABS. EOSINOPHILS: 0.1 10*3/uL (ref 0.0–0.4)
ABS. LYMPHOCYTES: 2.8 10*3/uL (ref 0.8–3.5)
ABS. MONOCYTES: 0.8 10*3/uL (ref 0.0–1.0)
ABS. NEUTROPHILS: 6.7 10*3/uL (ref 1.8–8.0)
BASOPHILS: 0 % (ref 0–1)
EOSINOPHILS: 1 % (ref 0–7)
HCT: 41.2 % (ref 36.6–50.3)
HGB: 14.3 g/dL (ref 12.1–17.0)
LYMPHOCYTES: 27 % (ref 12–49)
MCH: 30.4 PG (ref 26.0–34.0)
MCHC: 34.7 g/dL (ref 30.0–36.5)
MCV: 87.7 FL (ref 80.0–99.0)
MONOCYTES: 7 % (ref 5–13)
NEUTROPHILS: 65 % (ref 32–75)
PLATELET: 203 10*3/uL (ref 150–400)
RBC: 4.7 M/uL (ref 4.10–5.70)
RDW: 13.1 % (ref 11.5–14.5)
WBC: 10.5 10*3/uL (ref 4.1–11.1)

## 2016-05-07 LAB — CK: CK: 715 U/L — ABNORMAL HIGH (ref 39–308)

## 2016-05-07 LAB — EKG, 12 LEAD, INITIAL
Atrial Rate: 67 {beats}/min
Calculated P Axis: 33 degrees
Calculated R Axis: -3 degrees
Calculated T Axis: -5 degrees
Diagnosis: NORMAL
P-R Interval: 170 ms
Q-T Interval: 430 ms
QRS Duration: 106 ms
QTC Calculation (Bezet): 454 ms
Ventricular Rate: 67 {beats}/min

## 2016-05-07 LAB — METABOLIC PANEL, COMPREHENSIVE
A-G Ratio: 1.2 (ref 1.1–2.2)
ALT (SGPT): 55 U/L (ref 12–78)
AST (SGOT): 37 U/L (ref 15–37)
Albumin: 4.3 g/dL (ref 3.5–5.0)
Alk. phosphatase: 109 U/L (ref 45–117)
Anion gap: 9 mmol/L (ref 5–15)
BUN/Creatinine ratio: 23 — ABNORMAL HIGH (ref 12–20)
BUN: 19 MG/DL (ref 6–20)
Bilirubin, total: 0.7 MG/DL (ref 0.2–1.0)
CO2: 24 mmol/L (ref 21–32)
Calcium: 9.4 MG/DL (ref 8.5–10.1)
Chloride: 107 mmol/L (ref 97–108)
Creatinine: 0.82 MG/DL (ref 0.70–1.30)
GFR est AA: >60 mL/min/{1.73_m2} (ref >60)
GFR est non-AA: >60 mL/min/{1.73_m2} (ref >60)
Globulin: 3.5 g/dL (ref 2.0–4.0)
Glucose: 85 mg/dL (ref 65–100)
Potassium: 3.7 mmol/L (ref 3.5–5.1)
Protein, total: 7.8 g/dL (ref 6.4–8.2)
Sodium: 140 mmol/L (ref 136–145)

## 2016-05-07 LAB — DRUG SCREEN, URINE
AMPHETAMINES: NEGATIVE
BARBITURATES: NEGATIVE
BENZODIAZEPINES: POSITIVE — AB
COCAINE: POSITIVE — AB
METHADONE: NEGATIVE
OPIATES: NEGATIVE
PCP(PHENCYCLIDINE): NEGATIVE
THC (TH-CANNABINOL): POSITIVE — AB

## 2016-05-07 LAB — URINALYSIS W/MICROSCOPIC
Bacteria: NEGATIVE /hpf
Bilirubin: NEGATIVE
Blood: NEGATIVE
Glucose: NEGATIVE mg/dL
Ketone: 15 mg/dL — AB
Leukocyte Esterase: NEGATIVE
Nitrites: NEGATIVE
Protein: NEGATIVE mg/dL
Specific gravity: 1.025 (ref 1.003–1.030)
Urobilinogen: 1 EU/dL (ref 0.2–1.0)
pH (UA): 6 (ref 5.0–8.0)

## 2016-05-07 LAB — URINE CULTURE HOLD SAMPLE

## 2016-05-07 MED ORDER — NALOXONE 0.4 MG/ML INJECTION
0.4 mg/mL | INTRAMUSCULAR | Status: DC | PRN
Start: 2016-05-07 — End: 2016-05-07

## 2016-05-07 MED ORDER — D5-1/2 NS & POTASSIUM CHLORIDE 20 MEQ/L IV
20 mEq/L | INTRAVENOUS | Status: DC
Start: 2016-05-07 — End: 2016-05-07
  Administered 2016-05-07: 20:00:00 via INTRAVENOUS

## 2016-05-07 MED ORDER — LEVETIRACETAM IN SOD CHLORIDE (ISO-OSM) 1,000 MG/100 ML IV INFUSION
1000 mg/100 mL | Freq: Two times a day (BID) | INTRAVENOUS | Status: DC
Start: 2016-05-07 — End: 2016-05-07

## 2016-05-07 MED ORDER — SODIUM CHLORIDE 0.9 % IJ SYRG
INTRAMUSCULAR | Status: DC | PRN
Start: 2016-05-07 — End: 2016-05-07
  Administered 2016-05-07: 20:00:00 via INTRAVENOUS

## 2016-05-07 MED ORDER — CLONAZEPAM 1 MG TAB
1 mg | Freq: Three times a day (TID) | ORAL | Status: DC
Start: 2016-05-07 — End: 2016-05-07
  Administered 2016-05-07: 20:00:00 via ORAL

## 2016-05-07 MED ORDER — ACETAMINOPHEN 325 MG TABLET
325 mg | ORAL | Status: DC | PRN
Start: 2016-05-07 — End: 2016-05-07

## 2016-05-07 MED ORDER — LAMOTRIGINE 100 MG TAB
100 mg | Freq: Two times a day (BID) | ORAL | Status: DC
Start: 2016-05-07 — End: 2016-05-07
  Administered 2016-05-07 (×2): via ORAL

## 2016-05-07 MED ORDER — FLU VACCINE QV 2017-18 (36 MOS+)(PF) 60 MCG (15 MCG X 4)/0.5 ML IM SYRINGE
60 mcg (15 mcg x 4)/0.5 mL | INTRAMUSCULAR | Status: DC
Start: 2016-05-07 — End: 2016-05-07

## 2016-05-07 MED ORDER — PHENYTOIN SODIUM EXTENDED 100 MG CAP
100 mg | Freq: Three times a day (TID) | ORAL | Status: DC
Start: 2016-05-07 — End: 2016-05-07
  Administered 2016-05-07: 20:00:00 via ORAL

## 2016-05-07 MED ORDER — ENOXAPARIN 40 MG/0.4 ML SUB-Q SYRINGE
40 mg/0.4 mL | SUBCUTANEOUS | Status: DC
Start: 2016-05-07 — End: 2016-05-07

## 2016-05-07 MED ORDER — LORAZEPAM 2 MG/ML IJ SOLN
2 mg/mL | INTRAMUSCULAR | Status: DC | PRN
Start: 2016-05-07 — End: 2016-05-07

## 2016-05-07 MED ORDER — SODIUM CHLORIDE 0.9% BOLUS IV
0.9 % | Freq: Once | INTRAVENOUS | Status: AC
Start: 2016-05-07 — End: 2016-05-07
  Administered 2016-05-07: 14:00:00 via INTRAVENOUS

## 2016-05-07 MED ORDER — DIPHENHYDRAMINE HCL 50 MG/ML IJ SOLN
50 mg/mL | INTRAMUSCULAR | Status: DC | PRN
Start: 2016-05-07 — End: 2016-05-07

## 2016-05-07 MED ORDER — HYDROCODONE-ACETAMINOPHEN 5 MG-325 MG TAB
5-325 mg | Freq: Four times a day (QID) | ORAL | Status: DC | PRN
Start: 2016-05-07 — End: 2016-05-07
  Administered 2016-05-07: 20:00:00 via ORAL

## 2016-05-07 MED ORDER — SODIUM CHLORIDE 0.9 % IV
500 mg/5 mL | Freq: Two times a day (BID) | INTRAVENOUS | Status: DC
Start: 2016-05-07 — End: 2016-05-07
  Administered 2016-05-07: 22:00:00 via INTRAVENOUS

## 2016-05-07 MED ORDER — SODIUM CHLORIDE 0.9 % IJ SYRG
Freq: Three times a day (TID) | INTRAMUSCULAR | Status: DC
Start: 2016-05-07 — End: 2016-05-07
  Administered 2016-05-07: 20:00:00 via INTRAVENOUS

## 2016-05-07 MED ORDER — ONDANSETRON (PF) 4 MG/2 ML INJECTION
4 mg/2 mL | INTRAMUSCULAR | Status: DC | PRN
Start: 2016-05-07 — End: 2016-05-07

## 2016-05-07 MED FILL — ENOXAPARIN 40 MG/0.4 ML SUB-Q SYRINGE: 40 mg/0.4 mL | SUBCUTANEOUS | Qty: 0.4

## 2016-05-07 MED FILL — SODIUM CHLORIDE 0.9 % IV: INTRAVENOUS | Qty: 1000

## 2016-05-07 MED FILL — LAMOTRIGINE 100 MG TAB: 100 mg | ORAL | Qty: 2

## 2016-05-07 MED FILL — LEVETIRACETAM IN SOD CHLORIDE (ISO-OSM) 1,000 MG/100 ML IV INFUSION: 1000 mg/100 mL | INTRAVENOUS | Qty: 100

## 2016-05-07 MED FILL — CLONAZEPAM 1 MG TAB: 1 mg | ORAL | Qty: 1

## 2016-05-07 MED FILL — HYDROCODONE-ACETAMINOPHEN 5 MG-325 MG TAB: 5-325 mg | ORAL | Qty: 1

## 2016-05-07 MED FILL — LEVETIRACETAM 500 MG/5 ML IV SOLN: 500 mg/5 mL | INTRAVENOUS | Qty: 10

## 2016-05-07 MED FILL — D5-1/2 NS & POTASSIUM CHLORIDE 20 MEQ/L IV: 20 mEq/L | INTRAVENOUS | Qty: 1000

## 2016-05-07 MED FILL — NORMAL SALINE FLUSH 0.9 % INJECTION SYRINGE: INTRAMUSCULAR | Qty: 10

## 2016-05-07 MED FILL — DILANTIN EXTENDED 100 MG CAPSULE: 100 mg | ORAL | Qty: 1

## 2016-05-07 NOTE — Progress Notes (Addendum)
Urine sample sent to lab.     Seizure pads in place.
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
## 2016-05-07 NOTE — Progress Notes (Signed)
Primary Nurse Vidal Schwalbeonstance M Severson, RN and Marily MemosEdna, RN performed a dual skin assessment on this patient No impairment noted  Braden score is 23

## 2016-05-07 NOTE — ED Notes (Signed)
Assumed care of pt. Pt resting in stretcher in low/locked position and call bell within reach. Introduced self as primary nurse. Discussed plan of care with patient. Opportunity to ask questions given. Patient advised that medical information will be discussed and it is their own responsibility to let nurse know if such conversation should not take place in presence of visitors. Pt verbalizes understanding. Pt. Denies any additional complaints at this time.

## 2016-05-07 NOTE — Progress Notes (Signed)
I returned call from patient who reports that patient threatened to leave hospital (against medical advice).  Patient has documented history of non-compliance.  While on phone with nurse, she notes that patient refused to stay in the hospital and refused to sign AMA form.   Nurse notes that patient left the hospital AMA.

## 2016-05-07 NOTE — ED Triage Notes (Signed)
Patient has had 17 seizures in 3 days and thinks that he may have another one now.  [facility] and left AMA due to mis treatment by the nurses.

## 2016-05-07 NOTE — ACP (Advance Care Planning) (Signed)
??      ?? ??     Request by this pt to assist with advance medical directive in [facility]. Consulted with patient???s nurse, Constance on pt's capacity. RN confirmed pt was capable of AMD completion.  Explained document to patient as requested.  Pt was insistent on completing document personally and solely. Made copy for patient and placed a copy in pt's physical chart.   ??  Micah Jackson, MDiv. Staff Chaplain  Please call [phone] to page chaplain if needed

## 2016-05-07 NOTE — H&P (Signed)
[facility]   [address]   [place], [place] [address]   HISTORY AND PHYSICAL       Name:  [name], [name]   MR#:  [id number]   DOB:  November 11, 1985   Account #:  [id number]        Date of Adm:  05/07/2016       CHIEF COMPLAINT: Seizure disorder.     Information taken from old charts and old admission. The patient was   sedated, unable to provide any information to me.    HISTORY OF PRESENT ILLNESS: This is a 30 year old patient   known from the service of internal medicine due to multiple   admissions. In 2016, the patient was admitted at least 8 times for   different episode of seizures. This time, the patient was admitted for seizures. This   patient, who has had a lobectomy, was brought in a private vehicle   because he had seizures described as 17 seizures. There is no other   Characteristic of seizure or how long they last or any other associated with postictal   effect    ALLERGIES:   1. IODINE.   2. FISH-CONTAINING ELEMENT.    PAST MEDICAL HISTORY: The patient has a traumatic brain injury,   anxiety disorder, bipolar disorder, hyperlipidemia, hypertension,   seizure disorder, substance abuse, as well as obesity.    MEDICATIONS THAT THE PATIENT IS TAKING OR PRESCRIBED:    1. Dilantin ER 1000 mg 3 times a day.   2. Hydrochlorothiazide 25 mg daily.   3. Hydrocodone plus acetaminophen as needed for pain.   4. Flexeril 10 mg daily.   5. Lamictal 200 mg 2 times a day.   6. Keppra 750 mg twice a day.   7. Klonopin 1 mg 3 times a day.   8. Mevacor 200 mg daily.   9. Lisinopril 20 mg daily.    SURGICAL HISTORY: Lobectomy, as well as sinus surgery.     TOXIC HABITS: Positive for smoking.    FAMILY HISTORY: Positive for depression and some psychiatric   disorder in some family members.    PHYSICAL EXAMINATION:   VITAL SIGNS: Blood pressure is 146/82, heart rate 68, respirations 24,   temperature is 97.7.   GENERAL APPEARANCE: This is a 30 year old, did not respond to    verbal or tactile stimuli, in no acute distress.   HEENT: There is no deformity. Moist oral mucosa.   NECK: Supple, no JVD, no bruits. Thorax: Symmetrical expansion   without deformity.   HEART: Regular rhythm. S1, S2 present.   LUNGS: Clear to auscultation.   ABDOMEN: Soft. Bowel sounds are present. There is no   organomegaly.   LIMBS: Symmetric, no edema.   NEUROLOGIC: Not done.    LABORATORY DATA: WBC count is 10, hemoglobin and hematocrit is   14 and 41 with platelet count of 203. Sodium is 140, potassium 3.7,   chloride 107, CO2 is 24, creatinine is 8.82, BUN is 19, and glucose is   95. Liver functions are within normal limits.     IMAGING STUDIES: None done.    ASSESSMENT AND PLAN:   1. Seizure disorder. This patient in the past, compliance has been in   question, so I will assume that compliance could be what is   responsible for this the patient's frequent seizures. We will continue his   home medication and we will switch his Keppra from p.o. to IV.   2. Bipolar  disorder.  with this patient's ability to be compliant with the   medication. As such, we will get a consult from Psychiatry to help in   monitoring this patient from the bipolar standpoint of view.   3. Hypertension is stable with the current medication. His blood   pressure  146/82 and no further management with the blood   pressure.   4. Hyperlipidemia is stable with Mevacor. No other changes will be   done.        Beryle LatheMIGUEL A. Bradlee Heitman, MD      ME / JRH   D:  05/07/2016   14:01   T:  05/07/2016   14:39   Job #:  [id number]

## 2016-05-07 NOTE — Progress Notes (Signed)
Entered pt's room on rounds and observed iv pump facing pt with rate changed to 999, after asking pt had he touched the pump he denied tampering with it but stated he was familiar with the equipment from a previous hospitalization. IV rate to corrected to ordered rate and the pump locked. Charge nurse notified.

## 2016-05-07 NOTE — ED Triage Notes (Signed)
Patient comes to the ER c/o 17 witnessed seizures in the past 3 days. Patient states that he has had multiple seizures this morning but unknown of exact number. Patient states he is on multiple seizure medications and takes them regularly.

## 2016-05-07 NOTE — Progress Notes (Signed)
Bedside shift change report given to Connie, RN  (oncoming nurse) by Edna Harrison, RN  (offgoing nurse). Report included the following information SBAR and MAR.

## 2016-05-07 NOTE — Progress Notes (Signed)
TRANSFER - OUT REPORT:    Verbal report given to Edna, RN(name) on Skeet SimmerChristopher Lucado  being transferred to [facility] (unit) for routine progression of care       Report consisted of patient???s Situation, Background, Assessment and   Recommendations(SBAR).     Information from the following report(s) SBAR was reviewed with the receiving nurse.    Lines:   Peripheral IV 05/07/16 Right Antecubital (Active)   Site Assessment Clean, dry, & intact 05/07/2016  9:42 AM   Phlebitis Assessment 0 05/07/2016  9:42 AM   Infiltration Assessment 0 05/07/2016  9:42 AM   Dressing Status Clean, dry, & intact 05/07/2016  9:42 AM   Dressing Type Transparent 05/07/2016  9:42 AM   Hub Color/Line Status Green;Flushed;Patent 05/07/2016  9:42 AM        Opportunity for questions and clarification was provided.      Patient transported with:   [employer]

## 2016-05-07 NOTE — Progress Notes (Signed)
Chart reviewed, noted patient readmitted tot he ER (left AMA on 05/05/2016). Patient is here this admission with complaints of seizures, stating he has had as many as 17 in 3 days.    Patient has had 3 admissions in the past 6 months.    During his last admission, appointments were made for him for a new PCP as well as a neurologist.    Patients' address is listed as [place], [place]. Cm attempted to meet with patient but he was snoring, sleeping heavily.     Will try later as able.  YUM! BrandsCaitlin Helton BSW, [place]

## 2016-05-07 NOTE — ED Notes (Signed)
Pt asked on 3 different occasions for a urine specimen and he stated he did not want to be catheterized and could not void at this time.

## 2016-05-07 NOTE — Progress Notes (Addendum)
Spiritual Care Assessment/Progress Notes    [name] [id number]  [id number]    01/24/1986  30 y.o.  male    Patient Telephone Number: [phone] (home)   Religious Affiliation: [name]   Language: [name]   Extended Emergency Contact Information  Primary Emergency Contact: [name],[name]  Address: [address]           [place],  [address] [place]  Home Phone: [phone]  Relation: Sister   Patient Active Problem List    Diagnosis Date Noted   ??? Left rotator cuff tear 05/05/2016   ??? Epilepsy (HCC) 05/04/2016   ??? Seizures (HCC) 05/04/2016   ??? Non compliance w medication regimen 05/04/2016   ??? Seizure (HCC) 04/23/2016   ??? HTN (hypertension) 04/23/2016   ??? TBI (traumatic brain injury) (HCC) 04/23/2016   ??? Hyperlipidemia 04/23/2016   ??? Marijuana use 01/19/2016   ??? Nicotine dependence 01/19/2016   ??? Symptomatic generalized epilepsy (HCC) 01/18/2016   ??? History of craniotomy 01/17/2016   ??? Unsteady gait 07/20/2015   ??? Accidental phenytoin poisoning 07/19/2015   ??? Bipolar disorder, unspecified 04/13/2015   ??? Dilantin toxicity 04/02/2015   ??? Bipolar disorder with severe depression (HCC) 02/14/2013   ??? History of suicidal ideation 02/11/2013   ??? Psychosis 02/11/2013   ??? Localization-related (focal) (partial) epilepsy and epileptic syndromes with complex partial seizures, with intractable epilepsy 01/25/2012   ??? Non-compliance with treatment 10/01/2010   ??? Malingering 10/01/2010   ??? Polysubstance dependence (HCC) 10/01/2010   ??? Polysubstance overdose 08/22/2010     Class: Chronic   ??? Adjustment disorder with depressed mood 07/29/2010   ??? Borderline personality disorder 07/29/2010        Date: 05/07/2016       Level of Religious/Spiritual Activity:           Involved in faith tradition/spiritual practice             Not involved in faith tradition/spiritual practice           Spiritually oriented             Claims no spiritual orientation             seeking spiritual identity            Feels alienated from religious practice/tradition           Feels angry about religious practice/tradition           Spirituality/religious tradition is a Theatre stage manager for coping at this time.           Not able to assess due to medical condition    Services Provided Today:           crisis intervention             reading Scriptures           spiritual assessment             prayer           empathic listening/emotional support           rites and rituals (cite in comments)           life review              religious support           theological development            advocacy           Banker  blessing           bereavement support             support to family           anticipatory grief support            help with AMD           spiritual guidance             meditation      Spiritual Care Needs           Emotional Support           Spiritual/Religious Care           Loss/Adjustment           Advocacy/Referral                /Ethics           No needs expressed at               this time           Other: (note in               comments)  Spiritual Care Plan           Follow up visits with               pt/family           Provide materials           Schedule sacraments           Contact Community               Clergy           Follow up as needed           Other: (note in               comments)     Patient Encounter  ?? Present at time of visit: Patient  ?? Type: Pt Request (AMD) - Consulted Pt's RN, [name] to verify pt's capacity for completion and pt's capacity was affirmed.  ?? [facility]    Spiritual History  ?? Spirituality - Pt seems to be drawn to McDowellhristian orientation, but spoke very little about faith history  ?? Hope/Meaning - Pt seems to rely upon friends that he regards as family  ?? Effects on Healthcare - There seems to be no particular faith or value for this pt that would strongly influence healthcare   ?? Assessment - Pt expressed no emotional or spiritual needs on this visit and made no specific request for a spiritual discipline    Chaplain Intervention  ?? Provided support and assistance during the pt's completion of AMD  ?? Offered pt opportunity to process thoughts and emotions about this admission    General Note  Request by this pt to assist with advance medical directive. Consulted with patient???s nurse, [name] Rayaonstance on pt's capacity. Explained document to patient as requested.  Pt was insistent on completing document personally and solely. Made copy for patient and placed a copy in pt's physical chart.     [name], MDiv. Staff Chaplain  Please call [phone] to page chaplain if needed

## 2016-05-07 NOTE — Progress Notes (Signed)
Pt left against medical advice and refused to sign Informed Refusal form,removed saline lock and walked off unit with family member. Dr. Vaughan BastaPegram and supervisor notified.

## 2016-05-07 NOTE — Progress Notes (Signed)
SBAR report received from [place], Charity fundraiserN from E.D.

## 2016-05-07 NOTE — ACP (Advance Care Planning) (Signed)
 Request by this pt to assist with advance medical directive in [facility]. Consulted with patient's nurse, Garen on pt's capacity. RN confirmed pt was capable of AMD completion.  Explained document to patient as requested.  Pt was insistent on completing document personally and solely. Made copy for patient and placed a copy in pt's physical chart.     Tereasa Mace, MDiv. Staff Chaplain  Please call [phone]) to page chaplain if needed

## 2016-05-08 ENCOUNTER — Inpatient Hospital Stay
Admit: 2016-05-08 | Discharge: 2016-05-08 | Disposition: A | Discharge status: Home / Self Care | Payer: MEDICARE | Attending: Emergency Medicine

## 2016-05-08 LAB — CBC WITH AUTOMATED DIFF
ABS. BASOPHILS: 0.1 10*3/uL (ref 0.0–0.1)
ABS. EOSINOPHILS: 0.3 10*3/uL (ref 0.0–0.4)
ABS. LYMPHOCYTES: 2.5 10*3/uL (ref 0.8–3.5)
ABS. MONOCYTES: 0.5 10*3/uL (ref 0.0–1.0)
ABS. NEUTROPHILS: 5.3 10*3/uL (ref 1.8–8.0)
BASOPHILS: 1 % (ref 0–1)
EOSINOPHILS: 3 % (ref 0–7)
HCT: 40.3 % (ref 36.6–50.3)
HGB: 13.9 g/dL (ref 12.1–17.0)
LYMPHOCYTES: 29 % (ref 12–49)
MCH: 30.3 PG (ref 26.0–34.0)
MCHC: 34.5 g/dL (ref 30.0–36.5)
MCV: 88 FL (ref 80.0–99.0)
MONOCYTES: 6 % (ref 5–13)
NEUTROPHILS: 61 % (ref 32–75)
PLATELET: 190 10*3/uL (ref 150–400)
RBC: 4.58 M/uL (ref 4.10–5.70)
RDW: 13.3 % (ref 11.5–14.5)
WBC: 8.7 10*3/uL (ref 4.1–11.1)

## 2016-05-08 LAB — METABOLIC PANEL, COMPREHENSIVE
A-G Ratio: 1.1 (ref 1.1–2.2)
ALT (SGPT): 49 U/L (ref 12–78)
AST (SGOT): 43 U/L — ABNORMAL HIGH (ref 15–37)
Albumin: 3.6 g/dL (ref 3.5–5.0)
Alk. phosphatase: 93 U/L (ref 45–117)
Anion gap: 6 mmol/L (ref 5–15)
BUN/Creatinine ratio: 22 — ABNORMAL HIGH (ref 12–20)
BUN: 17 MG/DL (ref 6–20)
Bilirubin, total: 0.4 MG/DL (ref 0.2–1.0)
CO2: 27 mmol/L (ref 21–32)
Calcium: 9 MG/DL (ref 8.5–10.1)
Chloride: 106 mmol/L (ref 97–108)
Creatinine: 0.77 MG/DL (ref 0.70–1.30)
GFR est AA: >60 mL/min/{1.73_m2} (ref >60)
GFR est non-AA: >60 mL/min/{1.73_m2} (ref >60)
Globulin: 3.3 g/dL (ref 2.0–4.0)
Glucose: 102 mg/dL — ABNORMAL HIGH (ref 65–100)
Potassium: 4.1 mmol/L (ref 3.5–5.1)
Protein, total: 6.9 g/dL (ref 6.4–8.2)
Sodium: 139 mmol/L (ref 136–145)

## 2016-05-08 LAB — PHENYTOIN: Phenytoin: <0.5 ug/mL — ABNORMAL LOW (ref 10.0–20.0)

## 2016-05-08 LAB — LEVETIRACETAM (KEPPRA): Levetiracetam (Keppra): 2.5 ug/mL — ABNORMAL LOW (ref 10.0–40.0)

## 2016-05-08 MED ORDER — FOSPHENYTOIN 500 MG PE/10 ML INJECTION
500 mg PE/10 mL | INTRAMUSCULAR | Status: AC
Start: 2016-05-08 — End: 2016-05-08
  Administered 2016-05-08: 06:00:00 via INTRAVENOUS

## 2016-05-08 MED ORDER — PHENYTOIN SODIUM EXTENDED 100 MG CAP
100 mg | ORAL_CAPSULE | Freq: Three times a day (TID) | ORAL | 0 refills | Status: DC
Start: 2016-05-08 — End: 2017-02-18

## 2016-05-08 MED ORDER — DIPHENHYDRAMINE HCL 50 MG/ML IJ SOLN
50 mg/mL | INTRAMUSCULAR | Status: AC
Start: 2016-05-08 — End: 2016-05-08
  Administered 2016-05-08: 07:00:00 via INTRAVENOUS

## 2016-05-08 MED ORDER — FOSPHENYTOIN 100 MG PE/2 ML INJECTION
100 mg PE/2 mL | INTRAMUSCULAR | Status: DC
Start: 2016-05-08 — End: 2016-05-08
  Administered 2016-05-08: 06:00:00 via INTRAVENOUS

## 2016-05-08 MED ORDER — SODIUM CHLORIDE 0.9% BOLUS IV
0.9 % | Freq: Once | INTRAVENOUS | Status: AC
Start: 2016-05-08 — End: 2016-05-08
  Administered 2016-05-08: 05:00:00 via INTRAVENOUS

## 2016-05-08 MED FILL — DIPHENHYDRAMINE HCL 50 MG/ML IJ SOLN: 50 mg/mL | INTRAMUSCULAR | Qty: 1

## 2016-05-08 MED FILL — FOSPHENYTOIN 500 MG PE/10 ML INJECTION: 500 mg PE/10 mL | INTRAMUSCULAR | Qty: 30

## 2016-05-08 MED FILL — LEVETIRACETAM 500 MG/5 ML IV SOLN: 500 mg/5 mL | INTRAVENOUS | Qty: 10

## 2016-05-08 MED FILL — SODIUM CHLORIDE 0.9 % IV: INTRAVENOUS | Qty: 1000

## 2016-05-08 NOTE — ED Notes (Signed)
Pt resting comfortably in bed; pt has not had any seizure activity in the ED.

## 2016-05-08 NOTE — ED Notes (Signed)
Pt agitated in room when doctor Dawna PartMarks tries to ask patient about previous admissions and leaving AMA from different hospitals.  Pt getting agitated when Dr Dawna PartMarks reads and tries to explains other doctors notes about seeing patient, pt denies every being seen or admitted to hospitals.  Dr Dawna PartMarks kept calm and explained to pt that just trying to understand to provide pt care. Pt demanding physician name and is agitated, RN tries to calm pt and asks if pt would like to speak to charge nurse, pt gets agitated and states "no".  Pt claiming the doctor is "calling me a liar" and not believing him, MD repeats multiple times can only go off information from the chart.

## 2016-05-08 NOTE — ED Notes (Signed)
Pt placed on bp and continuous pulse ox and seizure pads applied to railings of bed.
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
## 2016-05-08 NOTE — ED Notes (Signed)
Charge Nurse in room talking to pt; security standing outside room and nursing supervisor informed of situation by charge nurse.

## 2016-05-08 NOTE — ED Notes (Signed)
Pt given discharge paperwork by charge nurse because pt refused to see doctor; nursing supervisor went in with charge nurse as well.

## 2016-05-09 LAB — LAMOTRIGINE (LAMICTAL): Lamotrigine: NOT DETECTED ug/mL (ref 2.0–20.0)

## 2016-05-21 DIAGNOSIS — J209 Acute bronchitis, unspecified: Secondary | ICD-10-CM
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
## 2016-05-21 NOTE — ED Notes (Signed)
[name] Fushomas, PA-C at bedside to provide discharge paperwork. Vital signs stable. Pt in no apparent distress at this time. Mental status at baseline. Ambulatory to waiting room with steady gate, discharge paperwork in hand. Accompanied by male friend.

## 2016-05-22 ENCOUNTER — Inpatient Hospital Stay
Admit: 2016-05-22 | Discharge: 2016-05-22 | Disposition: A | Discharge status: Home / Self Care | Payer: MEDICARE | Attending: Emergency Medicine

## 2016-05-22 LAB — STREP AG SCREEN, GROUP A: Group A Strep Ag ID: NEGATIVE

## 2016-05-22 MED ORDER — PREDNISONE 5 MG TABLETS IN A DOSE PACK
5 mg | ORAL_TABLET | ORAL | 0 refills | Status: DC
Start: 2016-05-22 — End: 2017-02-18

## 2016-05-22 MED ORDER — HYDROCODONE-ACETAMINOPHEN 5 MG-325 MG TAB
5-325 mg | ORAL_TABLET | Freq: Four times a day (QID) | ORAL | 0 refills | Status: AC | PRN
Start: 2016-05-22 — End: ?

## 2016-05-22 MED ORDER — NAPROXEN 500 MG TAB
500 mg | ORAL_TABLET | Freq: Two times a day (BID) | ORAL | 0 refills | Status: DC | PRN
Start: 2016-05-22 — End: 2017-02-18

## 2016-05-22 MED ORDER — HYDROCODONE-ACETAMINOPHEN 7.5 MG-325 MG/15 ML ORAL SOLN
ORAL | Status: AC
Start: 2016-05-22 — End: 2016-05-21
  Administered 2016-05-22: 02:00:00 via ORAL

## 2016-05-22 MED ORDER — DEXAMETHASONE SODIUM PHOSPHATE 4 MG/ML IJ SOLN
4 mg/mL | INTRAMUSCULAR | Status: AC
Start: 2016-05-22 — End: 2016-05-21
  Administered 2016-05-22: 02:00:00 via ORAL

## 2016-05-22 MED FILL — HYDROCODONE-ACETAMINOPHEN 7.5 MG-325 MG/15 ML ORAL SOLN: ORAL | Qty: 15

## 2016-05-22 MED FILL — DEXAMETHASONE SODIUM PHOSPHATE 4 MG/ML IJ SOLN: 4 mg/mL | INTRAMUSCULAR | Qty: 3

## 2016-05-24 LAB — CULTURE, THROAT: Culture result:: NORMAL

## 2016-05-28 ENCOUNTER — Encounter: Attending: Adult Health

## 2016-06-11 ENCOUNTER — Encounter: Attending: Internal Medicine

## 2016-08-26 DIAGNOSIS — G40919 Epilepsy, unspecified, intractable, without status epilepticus: Secondary | ICD-10-CM

## 2016-08-26 NOTE — ED Triage Notes (Signed)
Triage Note: Patient reports 4 seizures in last 2 hours. Hx of seizures. Patient has been taking medications as prescribed.
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
## 2016-08-26 NOTE — ED Notes (Deleted)
MD Dr. Dawna PartMarks at bedside.

## 2016-08-26 NOTE — ED Notes (Signed)
Seizure pads placed onto stretcher. Patient a&ox3, not postictal.

## 2016-08-27 ENCOUNTER — Inpatient Hospital Stay

## 2016-08-27 ENCOUNTER — Inpatient Hospital Stay
Admit: 2016-08-27 | Discharge: 2016-08-27 | Discharge status: Against Medical Advice | Payer: MEDICARE | Attending: Hospitalist | Admitting: Hospitalist

## 2016-08-27 ENCOUNTER — Inpatient Hospital Stay: Admit: 2016-08-27 | Payer: MEDICARE

## 2016-08-27 LAB — MAGNESIUM: Magnesium: 1.8 mg/dL (ref 1.6–2.4)

## 2016-08-27 LAB — LIPID PANEL
CHOL/HDL Ratio: 5.6 — ABNORMAL HIGH (ref 0–5.0)
Cholesterol, total: 186 MG/DL (ref <200)
HDL Cholesterol: 33 MG/DL
LDL, calculated: 121.8 MG/DL — ABNORMAL HIGH (ref 0–100)
Triglyceride: 156 MG/DL — ABNORMAL HIGH (ref <150)
VLDL, calculated: 31.2 MG/DL

## 2016-08-27 LAB — CBC WITH AUTOMATED DIFF
ABS. BASOPHILS: 0.1 10*3/uL (ref 0.0–0.1)
ABS. BASOPHILS: 0.1 10*3/uL (ref 0.0–0.1)
ABS. EOSINOPHILS: 0.1 10*3/uL (ref 0.0–0.4)
ABS. EOSINOPHILS: 0.2 10*3/uL (ref 0.0–0.4)
ABS. IMM. GRANS.: 0.1 10*3/uL — ABNORMAL HIGH (ref 0.00–0.04)
ABS. IMM. GRANS.: 0.1 10*3/uL — ABNORMAL HIGH (ref 0.00–0.04)
ABS. LYMPHOCYTES: 2.3 10*3/uL (ref 0.8–3.5)
ABS. LYMPHOCYTES: 3.4 10*3/uL (ref 0.8–3.5)
ABS. MONOCYTES: 0.7 10*3/uL (ref 0.0–1.0)
ABS. MONOCYTES: 0.8 10*3/uL (ref 0.0–1.0)
ABS. NEUTROPHILS: 15.7 10*3/uL — ABNORMAL HIGH (ref 1.8–8.0)
ABS. NEUTROPHILS: 8.5 10*3/uL — ABNORMAL HIGH (ref 1.8–8.0)
ABSOLUTE NRBC: 0 10*3/uL (ref 0.00–0.01)
ABSOLUTE NRBC: 0 10*3/uL (ref 0.00–0.01)
BASOPHILS: 0 % (ref 0–1)
BASOPHILS: 0 % (ref 0–1)
EOSINOPHILS: 1 % (ref 0–7)
EOSINOPHILS: 2 % (ref 0–7)
HCT: 44.9 % (ref 36.6–50.3)
HCT: 41.6 % (ref 36.6–50.3)
HGB: 15.3 g/dL (ref 12.1–17.0)
HGB: 14 g/dL (ref 12.1–17.0)
IMMATURE GRANULOCYTES: 0 % (ref 0.0–0.5)
IMMATURE GRANULOCYTES: 0 % (ref 0.0–0.5)
LYMPHOCYTES: 12 % (ref 12–49)
LYMPHOCYTES: 26 % (ref 12–49)
MCH: 30.9 PG (ref 26.0–34.0)
MCH: 30.5 PG (ref 26.0–34.0)
MCHC: 34.1 g/dL (ref 30.0–36.5)
MCHC: 33.7 g/dL (ref 30.0–36.5)
MCV: 90.7 FL (ref 80.0–99.0)
MCV: 90.6 FL (ref 80.0–99.0)
MONOCYTES: 4 % — ABNORMAL LOW (ref 5–13)
MONOCYTES: 6 % (ref 5–13)
MPV: 10.9 FL (ref 8.9–12.9)
MPV: 10.8 FL (ref 8.9–12.9)
NEUTROPHILS: 83 % — ABNORMAL HIGH (ref 32–75)
NEUTROPHILS: 66 % (ref 32–75)
NRBC: 0 PER 100 WBC
NRBC: 0 PER 100 WBC
PLATELET: 253 10*3/uL (ref 150–400)
PLATELET: 215 10*3/uL (ref 150–400)
RBC: 4.95 M/uL (ref 4.10–5.70)
RBC: 4.59 M/uL (ref 4.10–5.70)
RDW: 12.9 % (ref 11.5–14.5)
RDW: 12.9 % (ref 11.5–14.5)
WBC: 18.9 10*3/uL — ABNORMAL HIGH (ref 4.1–11.1)
WBC: 13 10*3/uL — ABNORMAL HIGH (ref 4.1–11.1)

## 2016-08-27 LAB — METABOLIC PANEL, COMPREHENSIVE
A-G Ratio: 1.2 (ref 1.1–2.2)
A-G Ratio: 1.2 (ref 1.1–2.2)
ALT (SGPT): 56 U/L (ref 12–78)
ALT (SGPT): 47 U/L (ref 12–78)
AST (SGOT): 26 U/L (ref 15–37)
AST (SGOT): 18 U/L (ref 15–37)
Albumin: 4.3 g/dL (ref 3.5–5.0)
Albumin: 4 g/dL (ref 3.5–5.0)
Alk. phosphatase: 113 U/L (ref 45–117)
Alk. phosphatase: 98 U/L (ref 45–117)
Anion gap: 9 mmol/L (ref 5–15)
Anion gap: 9 mmol/L (ref 5–15)
BUN/Creatinine ratio: 11 — ABNORMAL LOW (ref 12–20)
BUN/Creatinine ratio: 11 — ABNORMAL LOW (ref 12–20)
BUN: 9 MG/DL (ref 6–20)
BUN: 9 MG/DL (ref 6–20)
Bilirubin, total: 0.3 MG/DL (ref 0.2–1.0)
Bilirubin, total: 0.3 MG/DL (ref 0.2–1.0)
CO2: 28 mmol/L (ref 21–32)
CO2: 26 mmol/L (ref 21–32)
Calcium: 9 MG/DL (ref 8.5–10.1)
Calcium: 8.7 MG/DL (ref 8.5–10.1)
Chloride: 105 mmol/L (ref 97–108)
Chloride: 106 mmol/L (ref 97–108)
Creatinine: 0.84 MG/DL (ref 0.70–1.30)
Creatinine: 0.81 MG/DL (ref 0.70–1.30)
GFR est AA: >60 mL/min/{1.73_m2} (ref >60)
GFR est AA: >60 mL/min/{1.73_m2} (ref >60)
GFR est non-AA: >60 mL/min/{1.73_m2} (ref >60)
GFR est non-AA: >60 mL/min/{1.73_m2} (ref >60)
Globulin: 3.6 g/dL (ref 2.0–4.0)
Globulin: 3.3 g/dL (ref 2.0–4.0)
Glucose: 111 mg/dL — ABNORMAL HIGH (ref 65–100)
Glucose: 82 mg/dL (ref 65–100)
Potassium: 4 mmol/L (ref 3.5–5.1)
Potassium: 3.8 mmol/L (ref 3.5–5.1)
Protein, total: 7.9 g/dL (ref 6.4–8.2)
Protein, total: 7.3 g/dL (ref 6.4–8.2)
Sodium: 142 mmol/L (ref 136–145)
Sodium: 141 mmol/L (ref 136–145)

## 2016-08-27 LAB — URINALYSIS W/ RFLX MICROSCOPIC
Bacteria: NEGATIVE /hpf
Bilirubin: NEGATIVE
Blood: NEGATIVE
Glucose: NEGATIVE mg/dL
Ketone: NEGATIVE mg/dL
Leukocyte Esterase: NEGATIVE
Nitrites: NEGATIVE
Protein: NEGATIVE mg/dL
Specific gravity: 1.021 (ref 1.003–1.030)
Urobilinogen: 1 EU/dL (ref 0.2–1.0)
pH (UA): 6 (ref 5.0–8.0)

## 2016-08-27 LAB — C REACTIVE PROTEIN, QT: C-Reactive protein: 1.13 mg/dL — ABNORMAL HIGH (ref 0.00–0.60)

## 2016-08-27 LAB — DRUG SCREEN, URINE
AMPHETAMINES: NEGATIVE
BARBITURATES: NEGATIVE
BENZODIAZEPINES: NEGATIVE
COCAINE: NEGATIVE
METHADONE: NEGATIVE
OPIATES: NEGATIVE
PCP(PHENCYCLIDINE): NEGATIVE
THC (TH-CANNABINOL): POSITIVE — AB

## 2016-08-27 LAB — PHOSPHORUS: Phosphorus: 4.3 MG/DL (ref 2.6–4.7)

## 2016-08-27 LAB — LACTIC ACID: Lactic acid: 0.8 MMOL/L (ref 0.4–2.0)

## 2016-08-27 LAB — TSH 3RD GENERATION: TSH: 1.68 u[IU]/mL (ref 0.36–3.74)

## 2016-08-27 LAB — PHENYTOIN
Phenytoin: 1.5 ug/mL — ABNORMAL LOW (ref 10.0–20.0)
Phenytoin: 9.8 ug/mL — ABNORMAL LOW (ref 10.0–20.0)

## 2016-08-27 LAB — SED RATE (ESR): Sed rate, automated: 6 mm/hr (ref 0–15)

## 2016-08-27 MED ORDER — SODIUM CHLORIDE 0.9 % IJ SYRG
INTRAMUSCULAR | Status: DC | PRN
Start: 2016-08-27 — End: 2016-08-27

## 2016-08-27 MED ORDER — CLONAZEPAM 1 MG TAB
1 mg | Freq: Three times a day (TID) | ORAL | Status: DC
Start: 2016-08-27 — End: 2016-08-27

## 2016-08-27 MED ORDER — ONDANSETRON (PF) 4 MG/2 ML INJECTION
4 mg/2 mL | INTRAMUSCULAR | Status: DC | PRN
Start: 2016-08-27 — End: 2016-08-27
  Administered 2016-08-27 (×4): via INTRAVENOUS

## 2016-08-27 MED ORDER — SODIUM CHLORIDE 0.9 % IV
50 mg/mL | INTRAVENOUS | Status: AC
Start: 2016-08-27 — End: 2016-08-27
  Administered 2016-08-27: 05:00:00 via INTRAVENOUS

## 2016-08-27 MED ORDER — LEVETIRACETAM 500 MG/5 ML IV SOLN
500 mg/5 mL | INTRAVENOUS | Status: AC
Start: 2016-08-27 — End: 2016-08-26
  Administered 2016-08-27: 04:00:00 via INTRAVENOUS

## 2016-08-27 MED ORDER — HYDROMORPHONE (PF) 1 MG/ML IJ SOLN
1 mg/mL | INTRAMUSCULAR | Status: DC | PRN
Start: 2016-08-27 — End: 2016-08-27
  Administered 2016-08-27 (×4): via INTRAVENOUS

## 2016-08-27 MED ORDER — HYDROCODONE-ACETAMINOPHEN 5 MG-325 MG TAB
5-325 mg | ORAL | Status: DC | PRN
Start: 2016-08-27 — End: 2016-08-27

## 2016-08-27 MED ORDER — PHENYTOIN SODIUM EXTENDED 100 MG CAP
100 mg | Freq: Three times a day (TID) | ORAL | Status: DC
Start: 2016-08-27 — End: 2016-08-27
  Administered 2016-08-27: 16:00:00 via ORAL

## 2016-08-27 MED ORDER — DOCUSATE SODIUM 100 MG CAP
100 mg | Freq: Two times a day (BID) | ORAL | Status: DC
Start: 2016-08-27 — End: 2016-08-27

## 2016-08-27 MED ORDER — SODIUM CHLORIDE 0.9 % IV
INTRAVENOUS | Status: DC
Start: 2016-08-27 — End: 2016-08-27
  Administered 2016-08-27 (×2): via INTRAVENOUS

## 2016-08-27 MED ORDER — CYCLOBENZAPRINE 10 MG TAB
10 mg | Freq: Three times a day (TID) | ORAL | Status: DC | PRN
Start: 2016-08-27 — End: 2016-08-27

## 2016-08-27 MED ORDER — LAMOTRIGINE 100 MG TAB
100 mg | Freq: Once | ORAL | Status: AC
Start: 2016-08-27 — End: 2016-08-26
  Administered 2016-08-27: 05:00:00 via ORAL

## 2016-08-27 MED ORDER — NICOTINE 21 MG/24 HR DAILY PATCH
21 mg/24 hr | TRANSDERMAL | Status: DC
Start: 2016-08-27 — End: 2016-08-27

## 2016-08-27 MED ORDER — FLUOXETINE 20 MG CAP
20 mg | Freq: Every day | ORAL | Status: DC
Start: 2016-08-27 — End: 2016-08-27
  Administered 2016-08-27: 16:00:00 via ORAL

## 2016-08-27 MED ORDER — HYDROCHLOROTHIAZIDE 25 MG TAB
25 mg | Freq: Every day | ORAL | Status: DC
Start: 2016-08-27 — End: 2016-08-27
  Administered 2016-08-27: 16:00:00 via ORAL

## 2016-08-27 MED ORDER — LAMOTRIGINE 100 MG TAB
100 mg | Freq: Two times a day (BID) | ORAL | Status: DC
Start: 2016-08-27 — End: 2016-08-27
  Administered 2016-08-27: 16:00:00 via ORAL

## 2016-08-27 MED ORDER — LOVASTATIN 20 MG TAB
20 mg | Freq: Every evening | ORAL | Status: DC
Start: 2016-08-27 — End: 2016-08-27
  Administered 2016-08-27: 09:00:00 via ORAL

## 2016-08-27 MED ORDER — SODIUM CHLORIDE 0.9 % IJ SYRG
Freq: Three times a day (TID) | INTRAMUSCULAR | Status: DC
Start: 2016-08-27 — End: 2016-08-27
  Administered 2016-08-27 (×2): via INTRAVENOUS

## 2016-08-27 MED ORDER — PHENYTOIN SODIUM 50 MG/ML IV
50 mg/mL | INTRAVENOUS | Status: DC
Start: 2016-08-27 — End: 2016-08-26
  Administered 2016-08-27: 04:00:00 via INTRAVENOUS

## 2016-08-27 MED ORDER — HYDRALAZINE 20 MG/ML IJ SOLN
20 mg/mL | Freq: Four times a day (QID) | INTRAMUSCULAR | Status: DC | PRN
Start: 2016-08-27 — End: 2016-08-27

## 2016-08-27 MED ORDER — LEVETIRACETAM 500 MG TAB
500 mg | Freq: Two times a day (BID) | ORAL | Status: DC
Start: 2016-08-27 — End: 2016-08-27
  Administered 2016-08-27: 16:00:00 via ORAL

## 2016-08-27 MED ORDER — DIPHENHYDRAMINE HCL 50 MG/ML IJ SOLN
50 mg/mL | Freq: Once | INTRAMUSCULAR | Status: AC
Start: 2016-08-27 — End: 2016-08-27
  Administered 2016-08-27: 09:00:00 via INTRAVENOUS

## 2016-08-27 MED ORDER — ACETAMINOPHEN 325 MG TABLET
325 mg | ORAL | Status: DC | PRN
Start: 2016-08-27 — End: 2016-08-27

## 2016-08-27 MED ORDER — LISINOPRIL 20 MG TAB
20 mg | Freq: Every day | ORAL | Status: DC
Start: 2016-08-27 — End: 2016-08-27
  Administered 2016-08-27: 16:00:00 via ORAL

## 2016-08-27 MED ORDER — FLU VACCINE QV 2017-18 (36 MOS+)(PF) 60 MCG (15 MCG X 4)/0.5 ML IM SYRINGE
60 mcg (15 mcg x 4)/0.5 mL | INTRAMUSCULAR | Status: DC
Start: 2016-08-27 — End: 2016-08-27

## 2016-08-27 MED ORDER — LORAZEPAM 2 MG/ML IJ SOLN
2 mg/mL | INTRAMUSCULAR | Status: DC | PRN
Start: 2016-08-27 — End: 2016-08-27

## 2016-08-27 MED FILL — LEVETIRACETAM 500 MG/5 ML IV SOLN: 500 mg/5 mL | INTRAVENOUS | Qty: 10

## 2016-08-27 MED FILL — HYDROMORPHONE (PF) 1 MG/ML IJ SOLN: 1 mg/mL | INTRAMUSCULAR | Qty: 1

## 2016-08-27 MED FILL — ONDANSETRON (PF) 4 MG/2 ML INJECTION: 4 mg/2 mL | INTRAMUSCULAR | Qty: 2

## 2016-08-27 MED FILL — DIPHENHYDRAMINE HCL 50 MG/ML IJ SOLN: 50 mg/mL | INTRAMUSCULAR | Qty: 1

## 2016-08-27 MED FILL — FLUOXETINE 20 MG CAP: 20 mg | ORAL | Qty: 2

## 2016-08-27 MED FILL — PHENYTOIN SODIUM EXTENDED 100 MG CAP: 100 mg | ORAL | Qty: 2

## 2016-08-27 MED FILL — NICOTINE 21 MG/24 HR DAILY PATCH: 21 mg/24 hr | TRANSDERMAL | Qty: 1

## 2016-08-27 MED FILL — NORMAL SALINE FLUSH 0.9 % INJECTION SYRINGE: INTRAMUSCULAR | Qty: 10

## 2016-08-27 MED FILL — LAMOTRIGINE 100 MG TAB: 100 mg | ORAL | Qty: 2

## 2016-08-27 MED FILL — PHENYTOIN SODIUM 50 MG/ML IV: 50 mg/mL | INTRAVENOUS | Qty: 20

## 2016-08-27 MED FILL — LEVETIRACETAM 500 MG TAB: 500 mg | ORAL | Qty: 3

## 2016-08-27 MED FILL — HYDROCHLOROTHIAZIDE 25 MG TAB: 25 mg | ORAL | Qty: 1

## 2016-08-27 MED FILL — SODIUM CHLORIDE 0.9 % IV: INTRAVENOUS | Qty: 1000

## 2016-08-27 MED FILL — DOK 100 MG CAPSULE: 100 mg | ORAL | Qty: 1

## 2016-08-27 MED FILL — LISINOPRIL 20 MG TAB: 20 mg | ORAL | Qty: 1

## 2016-08-27 MED FILL — LAMOTRIGINE 100 MG TAB: 100 mg | ORAL | Qty: 1

## 2016-08-27 MED FILL — LOVASTATIN 20 MG TAB: 20 mg | ORAL | Qty: 1

## 2016-08-27 NOTE — Progress Notes (Signed)
Pt admitted to evaluate possible seizures.  He was not at all interested in seeing me and rather sharply though in control asked I leave, which I did.  Pt with dx of bipolar illness but also with TBI.  Brief interaction seems more like compromised affect control and erratic, impulsive behavior seems less like bipolar disorder than effects of TBI.  Does not always speak cooperatively and validity of what he says is questionable.  On mental status he was alert and oriented.  Hygiene and grooming ok.  Angry and dismissive.  No unusual motor activity.  An edge to normal speech pattern.  Did not attempt cognitive testing.  Conversation, albeit brief, did not show any cognitive difficulties.  No suicidal/homicidal/psychotic sx.  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

## 2016-08-27 NOTE — ED Notes (Signed)
Verbal shift change report given to Rokisha (oncoming nurse) by Stephanos Fan (offgoing nurse). Report included the following information ED Summary and Recent Results.
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
## 2016-08-27 NOTE — Progress Notes (Signed)
Routine EEG completed.

## 2016-08-27 NOTE — Progress Notes (Addendum)
Patient requesting the following to be addressed during this admission:   1. Pt would like to have his Advanced Care Directives to be updated  2. He is requesting to be set up with a PCP prior to discharge  3. 

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
## 2016-08-27 NOTE — ED Notes (Addendum)
Bedside shift change report given to Dicky Doeokisha (Cabin crewoncoming nurse) by Valli GlanceJillian (offgoing nurse). Report included the following information SBAR.     0154 Sprite given.     0200 Ambulated to bathroom with steady gait. Voided x 1    0215 TRANSFER - OUT REPORT:    Verbal report given to Sarah(name) on [name]  being transferred to 6s(unit) for routine progression of care       Report consisted of patient???s Situation, Background, Assessment and   Recommendations(SBAR).     Information from the following report(s) SBAR was reviewed with the receiving nurse.    Lines:   Peripheral IV 08/26/16 Right Antecubital (Active)   Site Assessment Clean, dry, & intact 08/26/2016  9:01 PM   Phlebitis Assessment 0 08/26/2016  9:01 PM   Infiltration Assessment 0 08/26/2016  9:01 PM   Dressing Status Clean, dry, & intact 08/26/2016  9:01 PM   Dressing Type Tape;Transparent 08/26/2016  9:01 PM   Hub Color/Line Status Pink;Capped;Flushed;Patent 08/26/2016  9:01 PM   Action Taken Blood drawn 08/26/2016  9:01 PM        Opportunity for questions and clarification was provided.      Patient transported with:   Monitor  Tech        .  [id number] Tech transported patient notified 6S nurse patient arrived. Tech attempt to place patient on cardiac monitor, patient requested 6S nurse to perform task.

## 2016-08-27 NOTE — Progress Notes (Signed)
Bedside and Verbal shift change report given to Ashleigh Kaye (oncoming nurse) by Sarah (offgoing nurse). Report included the following information SBAR, Kardex, Intake/Output, MAR and Recent Results.

## 2016-08-27 NOTE — Progress Notes (Addendum)
0235: pt arrived to unit via stretcher, without telemetry; RN informed tech Barbara Cower(Jason) that there was no bed in room and requested to please not leave patient alone in room, with telemetry monitoring on a stretcher, due to safety reasons. Transport tech responded that "Arline AspCindy Gumm has told him it is okay to leave patient's in room on stretcher"; Pt expressed his concerns for his safety that he was not comfortable being left in the room without telemetry or being seen by an Charity fundraiserN. Per pt, transport dismissed pt's concerns regarding his safety and proceeded to leave the room without the patient having any form of monitoring.     [id number]: RN called to room; pt expressing he would like to speak with a supervisor regarding the above. Supervisors notified of situation     [phone]: Virl DiamondChuck Conservation officer, nature(nursing supervisor) at bedside. Patient expressed his concerns and was provided the number for patient advocacy to call in the morning. Unit manager [facility](Denice Chilton SiGreen) to be notified of situation in the morning. Will pass information along to day shift RN

## 2016-08-27 NOTE — H&P (Signed)
H&P dictated [id number]

## 2016-08-27 NOTE — Procedures (Signed)
ELECTROENCEPHALOGRAM REPORT     Patient Name: [name]  DOB: 06/01/1986  Age: 30 y.o.    Ordering physician: Aishia Barkey K Yusuf Yu, DO    Date of EEG:08/27/2016    Interpreting physician: Riad Wagley, DO      PROCEDURE: EEG.     CLINICAL INDICATION: The patient is a 30 y.o. male who is being evaluated for baseline electro cerebral activities and to rule out seizure focus.      DESCRIPTION OF THE RECORD:   The background of this recording contains a posteriorly-located occipital alpha rhythm of 10-11 Hz that attenuates with eye opening.  There was a normal frequency-amplitude gradient.    Throughout the recording, there were neither clear areas of focal slowing nor spike or spike-and-wave discharges seen.     Hyperventilation and Photic stimulation were not performed.      During the recording, the patient did enter prolonged states of sleep with K-complexes and symmetric sleep spindles seen in the central head regions.     INTERPRETATION: This is a normal electroencephalogram with the patient   awake and asleep showing no clear focal abnormalities or epileptiform   activity. A normal EEG does not rule out seizures. Clinical correlation recommended.        Ireene Ballowe K Twanisha Foulk, DO  Diplomate, American Board of Psychiatry & Neurology (Neurology)

## 2016-08-27 NOTE — Discharge Summary (Signed)
Discharge Summary       PATIENT ID: [name]  MRN: [id number]   DATE OF BIRTH: 1985/09/21    DATE OF ADMISSION: 08/26/2016  9:00 PM    DATE OF DISCHARGE: 08/27/2016  PRIMARY CARE PROVIDER: None       DISCHARGING PHYSICIAN: Roderic Scarce, MD    To contact this individual call [phone] and ask the operator to page.  If unavailable ask to be transferred the Adult Hospitalist Department.    CONSULTATIONS: IP CONSULT TO NEUROLOGY  IP CONSULT TO NEUROLOGY  IP CONSULT TO PSYCHIATRY    PROCEDURES/SURGERIES: * No surgery found *    ADMITTING DIAGNOSES & HOSPITAL COURSE:      This is a 31 year old man with a past medical history significant for seizure disorder, hypertension, bipolar disorder, dyslipidemia, anxiety/depression who was in his usual state of health until the day of presentation at the emergency room when the patient developed a seizure.  According to report, the patient had 4 seizures before coming to the emergency room.  Three episodes were witnessed by the patient's spouse.  The patient has had a seizure disorder for the past 17 years following a traumatic brain injury.  According to the patient for the past 1 month, he has been having seizure activity more frequently than usual.  He has seen his neurologist at [facility] and was told that the patient may require further intervention if he continues to have a seizure on present therapy.  The seizure was described as tonic-clonic activity.  Patient was brought to the emergency room for further evaluation.  Although the patient stated that he has been taking his prescribed medication, which includes Dilantin, his Dilantin level in the emergency room was subtherapeutic.  Patient also stated that he has generalized body aches following the multiple seizure activity.  Patient described the body aches as severe, 10/10 in severity.  No known relieving factors.  Aggravated by the seizure  activity.  The pain is constant.  The patient has no fever, no rigors and no chills.  When the patient arrived at the emergency room, he was loaded with Keppra and Dilantin and was referred to the hospitalist service for evaluation for admission.        DISCHARGE DIAGNOSES / PLAN:      Breakthrough Seziure: Loaded with dilantin and keppra  Likely due to medication non complaince  Neurology was consulted, recommended continuing home anti-seizure medication.      Patient signed out AMA.       PENDING TEST RESULTS:   At the time of discharge the following test results are still pending:     FOLLOW UP APPOINTMENTS:    Follow-up Information     Follow up With Details Comments Contact Info    Daine Floras, MD On 09/02/2016 Hospital follow up PCP appointment on Wednesday, 09/02/16 @ 10:30 a.m.   Patient needs to arrive 15 minutes early with picture id, insurance card and a listing of all medications.  [address]  [employer] [address]  [place] [address]  [phone]             ADDITIONAL CARE RECOMMENDATIONS:     DIET: Resume previous diet    ACTIVITY: Activity as tolerated    WOUND CARE: none    EQUIPMENT needed:       DISCHARGE MEDICATIONS:  Current Discharge Medication List            NOTIFY YOUR PHYSICIAN FOR ANY OF THE FOLLOWING:  Fever over 101 degrees for 24 hours.   Chest pain, shortness of breath, fever, chills, nausea, vomiting, diarrhea, change in mentation, falling, weakness, bleeding. Severe pain or pain not relieved by medications.  Or, any other signs or symptoms that you may have questions about.    DISPOSITION:    Home With:   OT  PT  HH  RN       Long term SNF/Inpatient Rehab    Independent/assisted living    Hospice   x Other: AMA       PATIENT CONDITION AT DISCHARGE:     Functional status    Poor     Deconditioned    x Independent      Cognition   x  Lucid     Forgetful     Dementia      Catheters/lines (plus indication)    Foley     PICC     PEG    x None      Code status   x  Full code      DNR      PHYSICAL EXAMINATION AT DISCHARGE:   Refer to Progress Note      CHRONIC MEDICAL DIAGNOSES:  Problem List as of 12-Sep-2016  Date Reviewed: 2016-09-12          Codes Class Noted - Resolved    Left rotator cuff tear ICD-10-CM: M75.102  ICD-9-CM: 840.4  05/05/2016 - Present        Epilepsy (HCC) (Chronic) ICD-10-CM: G40.909  ICD-9-CM: 345.90  05/04/2016 - Present        Seizures (HCC) ICD-10-CM: R56.9  ICD-9-CM: 780.39  05/04/2016 - Present        Non compliance w medication regimen ICD-10-CM: Z91.14  ICD-9-CM: V15.81  05/04/2016 - Present        * (Principal)Seizure (HCC) ICD-10-CM: R56.9  ICD-9-CM: 780.39  04/23/2016 - Present        HTN (hypertension) ICD-10-CM: I10  ICD-9-CM: 401.9  04/23/2016 - Present        TBI (traumatic brain injury) (HCC) (Chronic) ICD-10-CM: S06.9X9A  ICD-9-CM: 854.00  04/23/2016 - Present        Hyperlipidemia ICD-10-CM: E78.5  ICD-9-CM: 272.4  04/23/2016 - Present        Marijuana use ICD-10-CM: F12.90  ICD-9-CM: 305.20  01/19/2016 - Present        Nicotine dependence (Chronic) ICD-10-CM: F17.200  ICD-9-CM: 305.1  01/19/2016 - Present        Symptomatic generalized epilepsy (HCC) ICD-10-CM: G40.409  ICD-9-CM: 345.90  01/18/2016 - Present        History of craniotomy ICD-10-CM: Z98.890  ICD-9-CM: V45.89  01/17/2016 - Present        Unsteady gait ICD-10-CM: R26.81  ICD-9-CM: 781.2  07/20/2015 - Present        Accidental phenytoin poisoning ICD-10-CM: T42.0X1A  ICD-9-CM: 966.1, E855.0  07/19/2015 - Present        Bipolar disorder, unspecified ICD-10-CM: F31.9  ICD-9-CM: 296.80  04/13/2015 - Present        Dilantin toxicity ICD-10-CM: T42.0X1A  ICD-9-CM: 966.1, E980.4  04/02/2015 - Present        Bipolar disorder with severe depression (HCC) ICD-10-CM: F31.4  ICD-9-CM: 296.53  02/14/2013 - Present        History of suicidal ideation ICD-10-CM: Z86.59  ICD-9-CM: V11.8  02/11/2013 - Present        Psychosis ICD-10-CM: F29  ICD-9-CM: 298.9  02/11/2013 - Present  Localization-related (focal) (partial) epilepsy and epileptic syndromes with complex partial seizures, with intractable epilepsy ICD-10-CM: G40.219  ICD-9-CM: 345.41  01/25/2012 - Present        Non-compliance with treatment (Chronic) ICD-10-CM: Z91.19  ICD-9-CM: V15.81  10/01/2010 - Present        Malingering ICD-10-CM: Z76.5  ICD-9-CM: V65.2  10/01/2010 - Present    Overview Signed 10/01/2010  6:21 PM by Mackie PaiBruce R Stevens, MD     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             Polysubstance dependence (HCC) (Chronic) ICD-10-CM: F19.20  ICD-9-CM: 304.80  10/01/2010 - Present        Polysubstance overdose ICD-10-CM: T50.901A  ICD-9-CM: 977.9, E980.5 Chronic 08/22/2010 - Present        Adjustment disorder with depressed mood ICD-10-CM: F43.21  ICD-9-CM: 309.0  07/29/2010 - Present        Borderline personality disorder ICD-10-CM: F60.3  ICD-9-CM: 301.83  07/29/2010 - Present        RESOLVED: Seizures (HCC) ICD-10-CM: R56.9  ICD-9-CM: 780.39  02/21/2013 - 01/18/2016        RESOLVED: Opioid overdose ICD-10-CM: Z61.0R6ET40.2X1A  ICD-9-CM: 965.09, E980.0  02/10/2013 - 02/11/2013        RESOLVED: Narcotic overdose ICD-10-CM: T40.601A  ICD-9-CM: 967.9, E980.2  02/10/2013 - 02/11/2013              Greater than 30 minutes were spent with the patient on counseling and coordination of care    Signed:   Roderic ScarceNelson E Gwenith Tschida, MD  08/27/2016  4:08 PM

## 2016-08-27 NOTE — Consults (Signed)
Neuro consult done

## 2016-08-27 NOTE — Progress Notes (Signed)
Hospital follow-up PCP transitional care appointment has been scheduled with Dr. Jackolyn Conferuth Latham for Wednesday, 09/02/16 at 10:30 a.m.  Pending patient discharge.  Elio ForgetGladys Randolph, Care Management Specialist.

## 2016-08-27 NOTE — Progress Notes (Signed)
Dr. Krista BlueZaller visited patient briefly for Psych consult. Patient stated that he did not want to talk to him and sent him out of the room. After Dr. Krista BlueZaller left the room, the patient quickly called out to the nurse's station to speak to his nurse. Before I (the RN) was able to get to the room, the patient came to the nurse's station demanding to speak to the nurse manager or someone in administration. The CCL went to the room to speak with the patient. The patient let the CCL know that he felt as if none of his results were reported to him and he was angry about the Psych consult that was placed in the ED. We then paged the hospitalist to see if he would come see the patient. The supervisor was also on the way to see the patient. Before the hospitalist was able to make it up the room, the patient signed AMA papers and walked off the floor at 1540.

## 2016-08-27 NOTE — Progress Notes (Signed)
CM met with pt to introduce him to the role of CM and transition of care. He verbalized understanding. This pt lives at home with a roommate. He is independent with ADL's and IADL's. Per pt, he is in school as well. He does not have a PCP at the moment. CM called CM specialist and requested that an appointment be made for early next week. Will follow. Rica Koyanagi, BSW, ACM-SW  Care Management Interventions  PCP Verified by CM: No  Palliative Care Criteria Met (RRAT>21 & CHF Dx)?: No  Transition of Care Consult (CM Consult): Discharge Planning  MyChart Signup: No  Discharge Durable Medical Equipment: No  Physical Therapy Consult: No  Occupational Therapy Consult: No  Speech Therapy Consult: No  Current Support Network: Other (This pt lives with a roomate.)  Confirm Follow Up Transport: Self  Plan discussed with Pt/Family/Caregiver: Yes  Freedom of Choice Offered: Yes  [employer] Information Provided?: No

## 2016-08-27 NOTE — Progress Notes (Signed)
Primary Nurse Rosario JacksSarah F Gray, RN and Alphonzo LemmingsWhitney, RN performed a dual skin assessment on this patient No impairment noted  Braden score is 23

## 2016-08-27 NOTE — Discharge Summary (Signed)
Discharge Summary by Roderic Scarce, MD at 08/27/16 1046                Author: Roderic Scarce, MD  Service: Internal Medicine  Author Type: Physician       Filed: 08/30/16 1826  Date of Service: 08/27/16 1046  Status: Signed          Editor: Roderic Scarce, MD (Physician)                       Discharge Summary           PATIENT ID: [name]   MRN: [id number]     DATE OF BIRTH: [birth year]      DATE OF ADMISSION: 08/26/2016  9:00 PM      DATE OF DISCHARGE:  08/27/2016   PRIMARY CARE PROVIDER:  None          DISCHARGING PHYSICIAN:  Roderic Scarce, MD     To contact this individual call [phone] and ask the operator to page.  If unavailable ask to be transferred the Adult Hospitalist Department.      CONSULTATIONS: IP CONSULT TO NEUROLOGY   IP CONSULT TO NEUROLOGY   IP CONSULT TO PSYCHIATRY      PROCEDURES/SURGERIES: * No surgery found *      ADMITTING DIAGNOSES & HOSPITAL  COURSE:        This is a 31 year old man with a past medical history significant for seizure disorder, hypertension, bipolar disorder, dyslipidemia, anxiety/depression  who was in his usual state of health until the day of presentation at the emergency room when the patient developed a seizure.  According to report, the patient had 4 seizures before coming to the emergency room.  Three episodes were witnessed by the  patient's spouse.  The patient has had a seizure disorder for the past 17 years following a traumatic brain injury.  According to the patient for the past 1 month, he has been having seizure activity more frequently than usual.  He has seen his neurologist  at [facility] and was told that the patient may require further intervention if he continues to have a seizure on present therapy.  The seizure was described as tonic-clonic activity.  Patient was brought to the emergency room for further evaluation.  Although  the patient stated that he has been taking his prescribed medication, which includes Dilantin,  his Dilantin level in the emergency room was subtherapeutic.  Patient also stated that he has generalized body aches following the multiple seizure activity.   Patient described the body aches as severe, 10/10 in severity.  No known relieving factors.  Aggravated by the seizure activity.  The pain is constant.  The patient has no fever, no rigors and no chills.  When the patient arrived at the emergency  room, he was loaded with Keppra and Dilantin and was referred to the hospitalist service for evaluation for admission.              DISCHARGE DIAGNOSES / P LAN:           Breakthrough Seziure: Loaded with dilantin and keppra   Likely due to medication non complaince   Neurology was consulted, recommended continuing home anti-seizure medication.         Patient signed out AMA.           PENDING TEST RESULTS:    At the time of discharge the following test results are still pending:  FOLLOW UP APPOINTMENTS:       Follow-up Information        Follow up With  Details  Comments  Contact Info             Daine Floras, MD  On 09/02/2016  Hospital follow up PCP appointment on Wednesday, 09/02/16 @ 10:30 a.m.   Patient needs to arrive 15 minutes early with picture id, insurance  card and a listing of all medications.   [address]   [employer] [address]   [place] [address]   [phone]                    ADDITIONAL CARE RECOMMENDATIONS:       DIET: Resume previous diet      ACTIVITY: Activity as tolerated      WOUND CARE: none      EQUIPMENT needed:          DISCHARGE MEDICATIONS:     Current Discharge Medication List                     NOTIFY YOUR PHYSICIAN FOR ANY OF THE FOLLOWING:    Fever over 101 degrees for 24 hours.    Chest pain, shortness of breath, fever, chills, nausea, vomiting, diarrhea, change in mentation, falling, weakness, bleeding. Severe pain or pain not relieved by medications.   Or, any other signs or symptoms that you may have questions about.      DISPOSITION:         Home With:     OT     PT    HH    RN                   Long term SNF/Inpatient Rehab       Independent/assisted living          Hospice        x  Other: AMA           PATIENT CONDITION AT DISCHARGE:       Functional status         Poor        Deconditioned         x  Independent         Cognition       x   Lucid        Forgetful           Dementia         Catheters/lines (plus indication)         Foley        PICC        PEG         x  None         Code status       x   Full code           DNR         PHYSICAL EXAMINATION AT DISCHARGE:    Refer to Progress Note         CHRONIC MEDICAL DIAGNOSES:      Problem List as of 2016/08/28   Date Reviewed:  August 28, 2016                Codes  Class  Noted - Resolved             Left rotator cuff tear  ICD-10-CM: M75.102   ICD-9-CM: 840.4    05/05/2016 -  Present                       Epilepsy (HCC) (Chronic)  ICD-10-CM: G40.909   ICD-9-CM: 345.90    05/04/2016 - Present                       Seizures (HCC)  ICD-10-CM: R56.9   ICD-9-CM: 780.39    05/04/2016 - Present                       Non compliance w medication regimen  ICD-10-CM: Z91.14   ICD-9-CM: V15.81    05/04/2016 - Present                       * (Principal)Seizure (HCC)  ICD-10-CM: R56.9   ICD-9-CM: 780.39    04/23/2016 - Present                       HTN (hypertension)  ICD-10-CM: I10   ICD-9-CM: 401.9    04/23/2016 - Present                       TBI (traumatic brain injury) (HCC) (Chronic)  ICD-10-CM: S06.9X9A   ICD-9-CM: 854.00    04/23/2016 - Present                       Hyperlipidemia  ICD-10-CM: E78.5   ICD-9-CM: 272.4    04/23/2016 - Present                       Marijuana use  ICD-10-CM: F12.90   ICD-9-CM: 305.20    01/19/2016 - Present                       Nicotine dependence (Chronic)  ICD-10-CM: F17.200   ICD-9-CM: 305.1    01/19/2016 - Present                       Symptomatic generalized epilepsy (HCC)  ICD-10-CM: G40.409   ICD-9-CM: 345.90    01/18/2016 - Present                       History of craniotomy  ICD-10-CM: Z98.890    ICD-9-CM: V45.89    01/17/2016 - Present                       Unsteady gait  ICD-10-CM: R26.81   ICD-9-CM: 781.2    07/20/2015 - Present                       Accidental phenytoin poisoning  ICD-10-CM: T42.0X1A   ICD-9-CM: 966.1, E855.0    07/19/2015 - Present                       Bipolar disorder, unspecified  ICD-10-CM: F31.9   ICD-9-CM: 296.80    04/13/2015 - Present                       Dilantin toxicity  ICD-10-CM: T42.0X1A   ICD-9-CM: 966.1, E980.4    04/02/2015 - Present                       Bipolar disorder with severe depression (  HCC)  ICD-10-CM: F31.4   ICD-9-CM: 296.53    02/14/2013 - Present                       History of suicidal ideation  ICD-10-CM: Z86.59   ICD-9-CM: V11.8    02/11/2013 - Present                       Psychosis  ICD-10-CM: F29   ICD-9-CM: 298.9    02/11/2013 - Present                       Localization-related (focal) (partial) epilepsy and epileptic syndromes with complex partial seizures, with intractable epilepsy  ICD-10-CM: G40.219   ICD-9-CM: 345.41    01/25/2012 - Present                       Non-compliance with treatment (Chronic)  ICD-10-CM: Z91.19   ICD-9-CM: V15.81    10/01/2010 - Present                       Malingering  ICD-10-CM: Z76.5   ICD-9-CM: V65.2    10/01/2010 - Present          Overview Signed 10/01/2010  6:21 PM by Mackie Pai, MD            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                                   Polysubstance dependence (HCC) (Chronic)  ICD-10-CM: F19.20   ICD-9-CM: 304.80    10/01/2010 - Present                       Polysubstance overdose  ICD-10-CM: T50.901A   ICD-9-CM: 977.9, E980.5  Chronic  08/22/2010 - Present                       Adjustment disorder with depressed mood  ICD-10-CM: F43.21   ICD-9-CM: 309.0    07/29/2010 - Present                       Borderline personality disorder  ICD-10-CM: F60.3   ICD-9-CM: 301.83    07/29/2010 - Present                       RESOLVED: Seizures (HCC)  ICD-10-CM: R56.9   ICD-9-CM: 780.39    02/21/2013 - 01/18/2016                        RESOLVED: Opioid overdose  ICD-10-CM: Z61.0R6E   ICD-9-CM: 965.09, E980.0    02/10/2013 - 02/11/2013                       RESOLVED: Narcotic overdose  ICD-10-CM: T40.601A   ICD-9-CM: 967.9, E980.2    02/10/2013 - 02/11/2013                          Greater than 30 minutes were spent with the patient on counseling and coordination of care      Signed:    Roderic Scarce, MD   08/27/2016   4:08 PM

## 2016-08-27 NOTE — Procedures (Signed)
ELECTROENCEPHALOGRAM REPORT     Patient Name: [name]  DOB: [birth year]  Age: 31 y.o.    Ordering physician: Shelia MediaSupakunya K Arneta Mahmood, [name]    Date of EEG:08/27/2016    Interpreting physician: Lossie FaesSupakunya Anavey Coombes, [name]      PROCEDURE: EEG.     CLINICAL INDICATION: The patient is a 31 y.o. male who is being evaluated for baseline electro cerebral activities and to rule out seizure focus.      DESCRIPTION OF THE RECORD:   The background of this recording contains a posteriorly-located occipital alpha rhythm of 10-11 Hz that attenuates with eye opening.  There was a normal frequency-amplitude gradient.    Throughout the recording, there were neither clear areas of focal slowing nor spike or spike-and-wave discharges seen.     Hyperventilation and Photic stimulation were not performed.      During the recording, the patient did enter prolonged states of sleep with K-complexes and symmetric sleep spindles seen in the central head regions.     INTERPRETATION: This is a normal electroencephalogram with the patient   awake and asleep showing no clear focal abnormalities or epileptiform   activity. A normal EEG does not rule out seizures. Clinical correlation recommended.        ZOXWRUEAVSupakunya Barnett ApplebaumK Angelene Rome, [name]  Diplomate, American Board of Psychiatry & Neurology (Neurology)
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
## 2016-08-28 LAB — LEVETIRACETAM (KEPPRA): Levetiracetam (Keppra): NOT DETECTED ug/mL (ref 10.0–40.0)

## 2016-08-30 LAB — LAMOTRIGINE (LAMICTAL): Lamotrigine: NOT DETECTED ug/mL (ref 2.0–20.0)

## 2016-09-01 NOTE — Telephone Encounter (Signed)
Called and left patient a voicemail concerning his appointment.  He was scheduled for 09/02/16 at 10:30am with Dr. Emilee HeroLatham, however he has never been seen here in the office and Dr. Emilee HeroLatham is not taking New Patients.  I left a detailed message that I we could get patient in to the office for an appointment but he would need to return the call to this PSR and I could work him in to the schedule.

## 2016-09-02 ENCOUNTER — Encounter: Attending: Family Medicine

## 2017-02-18 ENCOUNTER — Inpatient Hospital Stay
Admit: 2017-02-18 | Discharge: 2017-02-18 | Disposition: A | Discharge status: Home / Self Care | Payer: MEDICAID | Attending: Emergency Medicine

## 2017-02-18 DIAGNOSIS — G40909 Epilepsy, unspecified, not intractable, without status epilepticus: Secondary | ICD-10-CM

## 2017-02-18 LAB — EKG 12-LEAD
Atrial Rate: 95 {beats}/min
Diagnosis: NORMAL
P Axis: 55 degrees
P-R Interval: 142 ms
Q-T Interval: 342 ms
QRS Duration: 90 ms
QTc Calculation (Bazett): 429 ms
R Axis: 32 degrees
T Axis: 5 degrees
Ventricular Rate: 95 {beats}/min

## 2017-02-18 LAB — METABOLIC PANEL, COMPREHENSIVE
A-G Ratio: 1.3 (ref 1.1–2.2)
ALT (SGPT): 46 U/L (ref 12–78)
AST (SGOT): 19 U/L (ref 15–37)
Albumin: 4 g/dL (ref 3.5–5.0)
Alk. phosphatase: 101 U/L (ref 45–117)
Anion gap: 7 mmol/L (ref 5–15)
BUN/Creatinine ratio: 13 (ref 12–20)
BUN: 11 MG/DL (ref 6–20)
Bilirubin, total: 0.2 MG/DL (ref 0.2–1.0)
CO2: 27 mmol/L (ref 21–32)
Calcium: 9.2 MG/DL (ref 8.5–10.1)
Chloride: 108 mmol/L (ref 97–108)
Creatinine: 0.83 MG/DL (ref 0.70–1.30)
GFR est AA: >60 mL/min/{1.73_m2} (ref >60)
GFR est non-AA: >60 mL/min/{1.73_m2} (ref >60)
Globulin: 3.2 g/dL (ref 2.0–4.0)
Glucose: 122 mg/dL — ABNORMAL HIGH (ref 65–100)
Potassium: 3.9 mmol/L (ref 3.5–5.1)
Protein, total: 7.2 g/dL (ref 6.4–8.2)
Sodium: 142 mmol/L (ref 136–145)

## 2017-02-18 LAB — EKG, 12 LEAD, INITIAL
Atrial Rate: 95 {beats}/min
Calculated P Axis: 55 degrees
Calculated R Axis: 32 degrees
Calculated T Axis: 5 degrees
Diagnosis: NORMAL
P-R Interval: 142 ms
Q-T Interval: 342 ms
QRS Duration: 90 ms
QTC Calculation (Bezet): 429 ms
Ventricular Rate: 95 {beats}/min

## 2017-02-18 LAB — DRUG SCREEN, URINE
AMPHETAMINES: NEGATIVE
BARBITURATES: NEGATIVE
BENZODIAZEPINES: NEGATIVE
COCAINE: NEGATIVE
METHADONE: NEGATIVE
OPIATES: NEGATIVE
PCP(PHENCYCLIDINE): NEGATIVE
THC (TH-CANNABINOL): POSITIVE — AB

## 2017-02-18 LAB — URINALYSIS W/ REFLEX CULTURE
Bacteria: NEGATIVE /hpf
Bilirubin: NEGATIVE
Blood: NEGATIVE
Glucose: NEGATIVE mg/dL
Leukocyte Esterase: NEGATIVE
Nitrites: NEGATIVE
Protein: NEGATIVE mg/dL
Specific gravity: 1.025 (ref 1.003–1.030)
Urobilinogen: 0.2 EU/dL (ref 0.2–1.0)
pH (UA): 6 (ref 5.0–8.0)

## 2017-02-18 LAB — CBC WITH AUTOMATED DIFF
ABS. BASOPHILS: 0.1 10*3/uL (ref 0.0–0.1)
ABS. EOSINOPHILS: 0.3 10*3/uL (ref 0.0–0.4)
ABS. IMM. GRANS.: 0 10*3/uL (ref 0.00–0.04)
ABS. LYMPHOCYTES: 2.9 10*3/uL (ref 0.8–3.5)
ABS. MONOCYTES: 0.7 10*3/uL (ref 0.0–1.0)
ABS. NEUTROPHILS: 7.3 10*3/uL (ref 1.8–8.0)
ABSOLUTE NRBC: 0 10*3/uL (ref 0.00–0.01)
BASOPHILS: 1 % (ref 0–1)
EOSINOPHILS: 2 % (ref 0–7)
HCT: 42.2 % (ref 36.6–50.3)
HGB: 14.1 g/dL (ref 12.1–17.0)
IMMATURE GRANULOCYTES: 0 % (ref 0.0–0.5)
LYMPHOCYTES: 26 % (ref 12–49)
MCH: 29.2 PG (ref 26.0–34.0)
MCHC: 33.4 g/dL (ref 30.0–36.5)
MCV: 87.4 FL (ref 80.0–99.0)
MONOCYTES: 6 % (ref 5–13)
MPV: 10.9 FL (ref 8.9–12.9)
NEUTROPHILS: 65 % (ref 32–75)
NRBC: 0 PER 100 WBC
PLATELET: 280 10*3/uL (ref 150–400)
RBC: 4.83 M/uL (ref 4.10–5.70)
RDW: 13.7 % (ref 11.5–14.5)
WBC: 11.3 10*3/uL — ABNORMAL HIGH (ref 4.1–11.1)

## 2017-02-18 LAB — PHENYTOIN: Phenytoin: 0.6 ug/mL — ABNORMAL LOW (ref 10.0–20.0)

## 2017-02-18 LAB — LACTIC ACID: Lactic acid: 1.1 MMOL/L (ref 0.4–2.0)

## 2017-02-18 MED ORDER — LAMOTRIGINE 100 MG TAB
100 mg | Freq: Every day | ORAL | Status: DC
Start: 2017-02-18 — End: 2017-02-18
  Administered 2017-02-18: 10:00:00 via ORAL

## 2017-02-18 MED ORDER — PHENYTOIN SODIUM EXTENDED 100 MG CAP
100 mg | ORAL_CAPSULE | Freq: Three times a day (TID) | ORAL | 0 refills | Status: AC
Start: 2017-02-18 — End: 2017-03-20

## 2017-02-18 MED ORDER — PHENYTOIN SODIUM 50 MG/ML IV
50 mg/mL | INTRAVENOUS | Status: AC
Start: 2017-02-18 — End: 2017-02-18
  Administered 2017-02-18: 09:00:00 via INTRAVENOUS

## 2017-02-18 MED ORDER — LAMOTRIGINE 100 MG TAB
100 mg | Freq: Every day | ORAL | Status: DC
Start: 2017-02-18 — End: 2017-02-18

## 2017-02-18 MED ORDER — LAMOTRIGINE 200 MG TAB
200 mg | ORAL_TABLET | Freq: Two times a day (BID) | ORAL | 0 refills | Status: AC
Start: 2017-02-18 — End: ?

## 2017-02-18 MED ORDER — LEVETIRACETAM 500 MG TAB
500 mg | ORAL | Status: AC
Start: 2017-02-18 — End: 2017-02-18
  Administered 2017-02-18: 10:00:00 via ORAL

## 2017-02-18 MED ORDER — SODIUM CHLORIDE 0.9% BOLUS IV
0.9 % | Freq: Once | INTRAVENOUS | Status: AC
Start: 2017-02-18 — End: 2017-02-18
  Administered 2017-02-18: 07:00:00 via INTRAVENOUS

## 2017-02-18 MED ORDER — LAMOTRIGINE 100 MG TAB
100 mg | ORAL | Status: AC
Start: 2017-02-18 — End: 2017-02-18
  Administered 2017-02-18: 09:00:00 via ORAL

## 2017-02-18 MED ORDER — TOPIRAMATE 100 MG TAB
100 mg | ORAL_TABLET | Freq: Two times a day (BID) | ORAL | 0 refills | Status: AC
Start: 2017-02-18 — End: ?

## 2017-02-18 MED ORDER — PHENYTOIN SODIUM 50 MG/ML IV
50 mg/mL | INTRAVENOUS | Status: DC
Start: 2017-02-18 — End: 2017-02-18

## 2017-02-18 MED ORDER — LEVETIRACETAM 750 MG TAB
750 mg | ORAL_TABLET | Freq: Two times a day (BID) | ORAL | 0 refills | Status: AC
Start: 2017-02-18 — End: 2017-03-20

## 2017-02-18 MED ORDER — LEVETIRACETAM 500 MG TAB
500 mg | ORAL | Status: AC
Start: 2017-02-18 — End: 2017-02-18
  Administered 2017-02-18: 09:00:00 via ORAL

## 2017-02-18 MED FILL — LEVETIRACETAM 500 MG TAB: 500 mg | ORAL | Qty: 2

## 2017-02-18 MED FILL — LEVETIRACETAM 500 MG TAB: 500 mg | ORAL | Qty: 1

## 2017-02-18 MED FILL — LAMICTAL 100 MG TABLET: 100 mg | ORAL | Qty: 1

## 2017-02-18 MED FILL — PHENYTOIN SODIUM 50 MG/ML IV: 50 mg/mL | INTRAVENOUS | Qty: 10

## 2017-02-18 MED FILL — PHENYTOIN SODIUM 50 MG/ML IV: 50 mg/mL | INTRAVENOUS | Qty: 20

## 2017-02-18 NOTE — ED Notes (Signed)
Patient notes the dosages on his meds are not right & talked with Dr Sharlot GowdaGregg about what hw was scheduled to take @ present & she will adjust the meds

## 2017-02-18 NOTE — ED Triage Notes (Signed)
Neuro MD Loreta AveMann- last seen 7 months ago. Last reported seizure a week ago per patient. From [place], lives in [place] now.
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
## 2017-02-18 NOTE — ED Notes (Signed)
.  Discharge instructions reviewed with patient and given to pt per MD gregg. Pt able to return/verbalize discharge instructions. Copy of discharge instructions given.  RX given to pt. Pt condition stable, no further complaints. Pt out of ER, accompanied by self. Ambulatory, steady gait. Wheelchair offered & pt declined. Belongings & discharge paperwork out of ED with patient. Pt stated he is going to wait for ride.     Pt educated on temporary floor of ED, as lobby was being cleaned at this time. Pt to wait for ride in rooms 2 or 4. Pt began to yell and become agitated at staff. "My insurance pays for me to wait in the lobby. This is ridiculous!  i've been here for a long time. 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 I'm going to call my fiance to find out how long before he gets here because this is ridiculous. Every time I speak, I've gotten rebuttal from someone, not one particular person but I want to talk to the charge nurse. No one is letting me speak."     0705: Pt left Room 18, demanding to talk to charge nurse. "i'm going to step out and make this call." Staff directed him out the EMS door, as that is the door to use at this time.  Charge nurse called-will come to speak with patient shortly.     0720: Pt has not returned to speak with charge nurse.

## 2017-02-18 NOTE — ED Notes (Signed)
Pt in hallway bedside security yelling that he requested ride and no one is willing to help, transportation set up for patient. Confirmation # [id number] eta 1 hr

## 2017-02-18 NOTE — ED Notes (Signed)
Bedside and Verbal shift change report given to Essence,RN (oncoming nurse) by Severiano GilbertMegan,RN (offgoing nurse). Report included the following information SBAR, ED Summary, Intake/Output, MAR and Recent Results. Resting at this time, given pillow for comfort.

## 2017-02-18 NOTE — ED Notes (Signed)
Pt A&Ox4, slow & steady gait, following commands. Prefers to sit in chair outside of room #1 inside of lay in bed (rom #1) until bed available.

## 2017-02-19 LAB — LAMOTRIGINE (LAMICTAL): Lamotrigine: NOT DETECTED ug/mL (ref 2.0–20.0)

## 2017-02-19 LAB — LEVETIRACETAM (KEPPRA): Levetiracetam (Keppra): NOT DETECTED ug/mL (ref 10.0–40.0)

## 2017-05-02 ENCOUNTER — Observation Stay

## 2017-05-02 ENCOUNTER — Observation Stay: Admit: 2017-05-02 | Payer: Self-pay

## 2017-05-02 ENCOUNTER — Inpatient Hospital Stay
Admit: 2017-05-02 | Discharge: 2017-05-02 | Discharge status: Against Medical Advice | Payer: Self-pay | Attending: Emergency Medicine

## 2017-05-02 ENCOUNTER — Inpatient Hospital Stay: Admit: 2017-05-02 | Discharge: 2017-05-03 | Payer: Self-pay | Attending: Emergency Medicine

## 2017-05-02 DIAGNOSIS — G40219 Localization-related (focal) (partial) symptomatic epilepsy and epileptic syndromes with complex partial seizures, intractable, without status epilepticus: Secondary | ICD-10-CM

## 2017-05-02 DIAGNOSIS — R569 Unspecified convulsions: Secondary | ICD-10-CM

## 2017-05-02 LAB — CBC WITH AUTOMATED DIFF
ABS. BASOPHILS: 0 10*3/uL (ref 0.0–0.1)
ABS. EOSINOPHILS: 0.2 10*3/uL (ref 0.0–0.4)
ABS. IMM. GRANS.: 0 10*3/uL (ref 0.00–0.04)
ABS. LYMPHOCYTES: 2.3 10*3/uL (ref 0.8–3.5)
ABS. MONOCYTES: 0.6 10*3/uL (ref 0.0–1.0)
ABS. NEUTROPHILS: 7.2 10*3/uL (ref 1.8–8.0)
ABSOLUTE NRBC: 0 10*3/uL (ref 0.00–0.01)
BASOPHILS: 0 % (ref 0–1)
EOSINOPHILS: 2 % (ref 0–7)
HCT: 39.5 % (ref 36.6–50.3)
HGB: 13.2 g/dL (ref 12.1–17.0)
IMMATURE GRANULOCYTES: 0 % (ref 0.0–0.5)
LYMPHOCYTES: 22 % (ref 12–49)
MCH: 29.7 PG (ref 26.0–34.0)
MCHC: 33.4 g/dL (ref 30.0–36.5)
MCV: 89 FL (ref 80.0–99.0)
MONOCYTES: 5 % (ref 5–13)
MPV: 11.4 FL (ref 8.9–12.9)
NEUTROPHILS: 70 % (ref 32–75)
NRBC: 0 PER 100 WBC
PLATELET: 220 10*3/uL (ref 150–400)
RBC: 4.44 M/uL (ref 4.10–5.70)
RDW: 13 % (ref 11.5–14.5)
WBC: 10.3 10*3/uL (ref 4.1–11.1)

## 2017-05-02 LAB — METABOLIC PANEL, COMPREHENSIVE
A-G Ratio: 1.2 (ref 1.1–2.2)
ALT (SGPT): 30 U/L (ref 12–78)
AST (SGOT): 24 U/L (ref 15–37)
Albumin: 3.8 g/dL (ref 3.5–5.0)
Alk. phosphatase: 96 U/L (ref 45–117)
Anion gap: 11 mmol/L (ref 5–15)
BUN/Creatinine ratio: 10 — ABNORMAL LOW (ref 12–20)
BUN: 10 MG/DL (ref 6–20)
Bilirubin, total: 0.4 MG/DL (ref 0.2–1.0)
CO2: 24 mmol/L (ref 21–32)
Calcium: 8.8 MG/DL (ref 8.5–10.1)
Chloride: 107 mmol/L (ref 97–108)
Creatinine: 0.96 MG/DL (ref 0.70–1.30)
GFR est AA: >60 mL/min/{1.73_m2} (ref >60)
GFR est non-AA: >60 mL/min/{1.73_m2} (ref >60)
Globulin: 3.3 g/dL (ref 2.0–4.0)
Glucose: 130 mg/dL — ABNORMAL HIGH (ref 65–100)
Potassium: 3.3 mmol/L — ABNORMAL LOW (ref 3.5–5.1)
Protein, total: 7.1 g/dL (ref 6.4–8.2)
Sodium: 142 mmol/L (ref 136–145)

## 2017-05-02 LAB — DRUG SCREEN, URINE
AMPHETAMINES: NEGATIVE
BARBITURATES: NEGATIVE
BENZODIAZEPINES: NEGATIVE
COCAINE: NEGATIVE
METHADONE: NEGATIVE
OPIATES: POSITIVE — AB
PCP(PHENCYCLIDINE): NEGATIVE
THC (TH-CANNABINOL): POSITIVE — AB

## 2017-05-02 LAB — ETHYL ALCOHOL: ALCOHOL(ETHYL),SERUM: <10 MG/DL (ref <10)

## 2017-05-02 LAB — SAMPLES BEING HELD

## 2017-05-02 MED ORDER — PHENYTOIN 50 MG CHEWABLE TAB
50 mg | Freq: Three times a day (TID) | ORAL | Status: DC
Start: 2017-05-02 — End: 2017-05-02

## 2017-05-02 MED ORDER — SODIUM CHLORIDE 0.9% BOLUS IV
0.9 % | Freq: Once | INTRAVENOUS | Status: AC
Start: 2017-05-02 — End: 2017-05-02
  Administered 2017-05-02: 14:00:00 via INTRAVENOUS

## 2017-05-02 MED ORDER — LORAZEPAM 2 MG/ML IJ SOLN
2 mg/mL | INTRAMUSCULAR | Status: AC
Start: 2017-05-02 — End: 2017-05-02
  Administered 2017-05-02: 14:00:00 via INTRAVENOUS

## 2017-05-02 MED ORDER — POTASSIUM CHLORIDE SR 10 MEQ TAB
10 mEq | ORAL | Status: AC
Start: 2017-05-02 — End: 2017-05-02
  Administered 2017-05-02: 15:00:00 via ORAL

## 2017-05-02 MED ORDER — HYDROCODONE-ACETAMINOPHEN 5 MG-325 MG TAB
5-325 mg | Freq: Four times a day (QID) | ORAL | Status: DC | PRN
Start: 2017-05-02 — End: 2017-05-02
  Administered 2017-05-02: 23:00:00 via ORAL

## 2017-05-02 MED ORDER — FENTANYL CITRATE (PF) 50 MCG/ML IJ SOLN
50 mcg/mL | INTRAMUSCULAR | Status: DC | PRN
Start: 2017-05-02 — End: 2017-05-02
  Administered 2017-05-02: 20:00:00 via INTRAVENOUS

## 2017-05-02 MED ORDER — FOSPHENYTOIN 500 MG PE/10 ML INJECTION
500 mg PE/10 mL | INTRAMUSCULAR | Status: AC
Start: 2017-05-02 — End: 2017-05-02
  Administered 2017-05-02: 14:00:00 via INTRAVENOUS

## 2017-05-02 MED ORDER — LORAZEPAM 2 MG/ML IJ SOLN
2 mg/mL | INTRAMUSCULAR | Status: DC | PRN
Start: 2017-05-02 — End: 2017-05-02

## 2017-05-02 MED ORDER — NICOTINE 14 MG/24 HR DAILY PATCH
14 mg/24 hr | TRANSDERMAL | Status: DC
Start: 2017-05-02 — End: 2017-05-02

## 2017-05-02 MED ORDER — LEVETIRACETAM 500 MG/5 ML IV SOLN
500 mg/5 mL | INTRAVENOUS | Status: AC
Start: 2017-05-02 — End: 2017-05-02
  Administered 2017-05-02: 15:00:00 via INTRAVENOUS

## 2017-05-02 MED ORDER — SODIUM CHLORIDE 0.9 % IJ SYRG
Freq: Three times a day (TID) | INTRAMUSCULAR | Status: DC
Start: 2017-05-02 — End: 2017-05-02
  Administered 2017-05-02: 18:00:00 via INTRAVENOUS

## 2017-05-02 MED ORDER — ACETAMINOPHEN 325 MG TABLET
325 mg | ORAL | Status: DC | PRN
Start: 2017-05-02 — End: 2017-05-02

## 2017-05-02 MED ORDER — LAMOTRIGINE 100 MG TAB
100 mg | Freq: Two times a day (BID) | ORAL | Status: DC
Start: 2017-05-02 — End: 2017-05-02
  Administered 2017-05-02: 23:00:00 via ORAL

## 2017-05-02 MED ORDER — SODIUM CHLORIDE 0.9 % IV
5005 mg/5 mL | Freq: Two times a day (BID) | INTRAVENOUS | Status: DC
Start: 2017-05-02 — End: 2017-05-02
  Administered 2017-05-02: 20:00:00 via INTRAVENOUS

## 2017-05-02 MED ORDER — CYCLOBENZAPRINE 10 MG TAB
10 mg | Freq: Three times a day (TID) | ORAL | Status: DC | PRN
Start: 2017-05-02 — End: 2017-05-02

## 2017-05-02 MED ORDER — SODIUM CHLORIDE 0.9 % IJ SYRG
INTRAMUSCULAR | Status: DC | PRN
Start: 2017-05-02 — End: 2017-05-02

## 2017-05-02 MED ORDER — NYSTATIN 100,000 UNIT/G TOPICAL CREAM
100000 unit/gram | Freq: Two times a day (BID) | CUTANEOUS | Status: DC
Start: 2017-05-02 — End: 2017-05-02

## 2017-05-02 MED FILL — LAMOTRIGINE 100 MG TAB: 100 mg | ORAL | Qty: 2

## 2017-05-02 MED FILL — LEVETIRACETAM 500 MG/5 ML IV SOLN: 500 mg/5 mL | INTRAVENOUS | Qty: 15

## 2017-05-02 MED FILL — K-TAB 10 MEQ TABLET,EXTENDED RELEASE: 10 mEq | ORAL | Qty: 4

## 2017-05-02 MED FILL — SODIUM CHLORIDE 0.9 % IV: INTRAVENOUS | Qty: 1000

## 2017-05-02 MED FILL — HYDROCODONE-ACETAMINOPHEN 5 MG-325 MG TAB: 5-325 mg | ORAL | Qty: 1

## 2017-05-02 MED FILL — DILANTIN INFATABS 50 MG CHEWABLE TABLET: 50 mg | ORAL | Qty: 4

## 2017-05-02 MED FILL — NYSTATIN 100,000 UNIT/G TOPICAL CREAM: 100000 unit/gram | CUTANEOUS | Qty: 15

## 2017-05-02 MED FILL — CEREBYX 500 MG PE/10 ML INJECTION SOLUTION: 500 mg PE/10 mL | INTRAMUSCULAR | Qty: 45

## 2017-05-02 MED FILL — LORAZEPAM 2 MG/ML IJ SOLN: 2 mg/mL | INTRAMUSCULAR | Qty: 1

## 2017-05-02 MED FILL — NORMAL SALINE FLUSH 0.9 % INJECTION SYRINGE: INTRAMUSCULAR | Qty: 20

## 2017-05-02 MED FILL — LEVETIRACETAM 500 MG/5 ML IV SOLN: 500 mg/5 mL | INTRAVENOUS | Qty: 10

## 2017-05-02 MED FILL — FENTANYL CITRATE (PF) 50 MCG/ML IJ SOLN: 50 mcg/mL | INTRAMUSCULAR | Qty: 2

## 2017-05-02 MED FILL — CYCLOBENZAPRINE 10 MG TAB: 10 mg | ORAL | Qty: 1

## 2017-05-02 NOTE — ED Notes (Signed)
Pt refusing to sit in bed, worried about getting belongings out of bag in chair, unable to obtain repeat VS at this time. Pt escorted to unit with paramedic and [employer] to de-escalate patient and remove from stimulation in ED.

## 2017-05-02 NOTE — Progress Notes (Signed)
Return visit to pt in ICU. He was the focus of a [place] prior to this visit. He attempted to explain what happened. Pt had put on his personal clothes. When asked if he was leaving he said that he put them on because he was cold. He continues to have difficulty maintaining a subject in conversation. One main concern he voiced was that of transportation upon discharge. This was passed on the the Nurse Supervisor. Chaplains will follow as needed.  Clancy Gourd, Chaplain, MDiv, MS, [facility]  [phone] [phone])

## 2017-05-02 NOTE — ED Notes (Addendum)
Pt c/o of itching, observed resting in bed talking on phone. Dr. Marella Bile  Updated.     10:23 AM  Pt heard from the nursing station yelling into the phone. [employer] reported to bedside. Pt witnessed throwing backpack from stretcher to the floor. Pt agitated about getting ahold of emergency contact to update. Pt assisted with making phone call and agreed to settled down. Pt belongings placed into patient belonging back out of reach.     11:13 AM  Pt undressing self from gown and changed self back into street clothes. Pt assisted back into bed and instructed not to get back up without calling for help. Call bell in reach and pt re-instructed on appropriate [place], pt demonstrates and verbalizes understanding. Pt states, "I'm sorry, I won't do it again. I don't give these out often, but you can slap me. Just don't do it too hard because I'm sadistic." Pt asked to be respectful and to cease use of any and all inappropriate language. Pt placed back on cardiac monitor x 4. Curtain open, visible from nursing station.

## 2017-05-02 NOTE — ED Notes (Signed)
Pt escorted to triage room 2, discussed Pt's current situation.  Pt was previous admitted and signed out AMA around 1900 this evening.  Pt states he was discouraged by nursing care in ICU and states he was not being adequately managed for his seizures.  Pt  Informed by staff that he will need to check back into the ED.  Pt also informed will need to remain calm and cooperative with staff.  Pt agrees to remain calm and cooperative with staff.

## 2017-05-02 NOTE — ED Notes (Addendum)
Hospitalist at bedside. Pt removed all ED monitoring equipment stating that he took them off because he went to XR. Note that patient did not go, nor have any orders to go to xray. Pt changed initial statement of not taking epilepsy meds for months to stating to MD that he had just "missed a few doses". Pt also appears disoriented and manic. Pt accused nurse of "putting something in my IV". RN always verified pt was aware of meds being received.   11:27 AM  Pt rang call bell and said "something spilled all over the computer". Computer was pulled closer to bedside and drink used to take PO meds was spilled all over keyboard, mouse, etc. Pt denies pulling computer closer to him.     11:54 AM  Pt removed entire pump from bedside along with lines because he states that he "has a brain tumor."

## 2017-05-02 NOTE — ED Triage Notes (Signed)
[place] his lower.  Pt was seen at [facility] and discharged.  Pt states he had 25 rounds of ECT in [place] this year in May.  Pt has been of his medications since the treatment.  Pt reports 6 seizures since yesterday at 1300.
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
## 2017-05-02 NOTE — Progress Notes (Signed)
1930. Security at bedside to secure patient's personal knife.  Patient verbally aggressive towards bedside nurse and charge nurse. Security escorted patient back to room. Patient continued to be verbally aggressive towards nursing staff. Patient exits patient room stating "he is sick of this" and he's leaving AMA.  IV removed. AMA papers signed by patient. Patient escorted off unit by security.

## 2017-05-02 NOTE — Discharge Summary (Signed)
Discharge Summary       PATIENT ID: [name]  MRN: [id number]   DATE OF BIRTH: 18-Sep-1985    DATE OF ADMISSION: 05/02/2017  8:48 AM    DATE OF DISCHARGE: 05/03/17  PRIMARY CARE PROVIDER: None       DISCHARGING PROVIDER: Marin [name], MD    To contact this individual call [phone] and ask the operator to page.  If unavailable ask to be transferred the Adult Hospitalist Department.    CONSULTATIONS: IP CONSULT TO NEUROLOGY      PROCEDURES/SURGERIES: * No surgery found *    IMAGING  Ct Head Wo Cont    Result Date: 05/02/2017  IMPRESSION: 1. No evidence of acute intracranial abnormality. 2. Unchanged right anterior frontal encephalomalacia.        HPI:  Patient is a pleasant 31 year old Caucasian gentleman with past medical history significant for seizure disorder, hypertension, bipolar disorder, anxiety, depression, who was in his usual state of health yesterday morning.  He said he went into [facility] Emergency Room.  At that time, he was given Keppra as well as Robaxin for his complaints and was discharged to home from the ER.  I have reviewed the discharge paperwork that he brought in from [facility].  He reports that he went home and he has not filled up his prescriptions, which were given to him.  He was still feeling anxious that he was going to have a seizure and came into the emergency room.  In the ER, the ER doctor has reviewed the case and given the patient Keppra as well as fosphenytoin bolus loading and requested hospitalist to admit the patient for observation.  The patient himself  when I went to see him.  Denied any seizures at this time.  He reports that he thinks he might have had seizures.  He has a husband who he thinks might have seen him, but I do not have the spouse at his bedside to verify if he already had a seizure.  The patient himself denies that he could remember any seizure,  but he thinks he might have had a seizure as mentioned earlier.  He denies any fevers or chills.  As mentioned earlier, he has not taken any seizure medicines and he still has his prescriptions with him on hand.  He reports that he had electroconvulsive therapy at [place] in the past and has not been on any seizure medicine since then.  When I saw the patient, he had a foul smelling odor like somebody who has not taken a shower for a few days now.  He also reported that he was having itching around his groin area, but he was refusing examination of that area.  He denies any fevers or chills.  He denies any bloody stool.  Denies hematemesis or hematochezia.  He reports that he has a neurologist locally but he cannot remember the name.  In the ER, he was loaded up with Keppra and fosphenytoin and was referred to hospitalist for admission.  ??      HOSPITAL COURSE:   patient was admitted to telemetry. Ct scan head was negative for acute abnormality. Neurology evaluated and recommended him keppra 1027m po bid. He was advised to be under observation, Psychiatry evaluated and recommended him to be on same medications for mood control as Op, - this was reported to me by nurse(Consult note pending at the time of this note).  Overnight patient decided to leave [facility] despite staff appearing to advise  him of the risk of seizures and death if he leaves. He went [facility].         DISCHARGE DIAGNOSES / PLAN:      Patient Active Problem List   Diagnosis Code   ??? Adjustment disorder with depressed mood F43.21   ??? Borderline personality disorder ([place]) F60.3   ??? Polysubstance overdose T50.901A   ??? Non-compliance with treatment Z91.19   ??? Malingering Z76.5   ??? Polysubstance dependence (HCC) F19.20   ??? 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 History of suicidal ideation Z86.59   ??? Psychosis ([place]) F29    ??? Bipolar disorder with severe depression ([place]) F31.4   ??? Dilantin toxicity T42.0X1A   ??? Bipolar disorder, unspecified ([place]) F31.9   ??? Accidental phenytoin poisoning T42.0X1A   ??? Unsteady gait R26.81   ??? History of craniotomy Z98.890   ??? 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 Marijuana use F12.90   ??? Nicotine dependence F17.200   ??? Seizure (HCC) R56.9   ??? HTN (hypertension) I10   ??? TBI (traumatic brain injury) ([place]) S06.9X9A   ??? Hyperlipidemia E78.5   ??? Epilepsy ([place]) G40.909   ??? Seizures ([place]) R56.9   ??? Non compliance w medication regimen Z91.14   ??? Left rotator cuff tear M75.102           Recent Results (from the past 24 hour(s))   SAMPLES BEING HELD    Collection Time: 05/02/17  9:04 AM   Result Value Ref Range    SAMPLES BEING HELD 1RED 1BLU 1UC     COMMENT        Add-on orders for these samples will be processed based on acceptable specimen integrity and analyte stability, which may vary by analyte.   CBC WITH AUTOMATED DIFF    Collection Time: 05/02/17  9:05 AM   Result Value Ref Range    WBC 10.3 4.1 - 11.1 K/uL    RBC 4.44 4.10 - 5.70 M/uL    HGB 13.2 12.1 - 17.0 g/dL    HCT 39.5 36.6 - 50.3 %    MCV 89.0 80.0 - 99.0 FL    MCH 29.7 26.0 - 34.0 PG    MCHC 33.4 30.0 - 36.5 g/dL    RDW 13.0 11.5 - 14.5 %    PLATELET 220 150 - 400 K/uL    MPV 11.4 8.9 - 12.9 FL    NRBC 0.0 0 PER 100 WBC    ABSOLUTE NRBC 0.00 0.00 - 0.01 K/uL    NEUTROPHILS 70 32 - 75 %    LYMPHOCYTES 22 12 - 49 %    MONOCYTES 5 5 - 13 %    EOSINOPHILS 2 0 - 7 %    BASOPHILS 0 0 - 1 %    IMMATURE GRANULOCYTES 0 0.0 - 0.5 %    ABS. NEUTROPHILS 7.2 1.8 - 8.0 K/UL    ABS. LYMPHOCYTES 2.3 0.8 - 3.5 K/UL    ABS. MONOCYTES 0.6 0.0 - 1.0 K/UL    ABS. EOSINOPHILS 0.2 0.0 - 0.4 K/UL    ABS. BASOPHILS 0.0 0.0 - 0.1 K/UL    ABS. IMM. GRANS. 0.0 0.00 - 0.04 K/UL    DF AUTOMATED     METABOLIC PANEL, COMPREHENSIVE    Collection Time: 05/02/17  9:05 AM   Result Value Ref Range    Sodium 142 136 - 145 mmol/L    Potassium 3.3 (L)  3.5 - 5.1 mmol/L     Chloride 107 97 - 108 mmol/L    CO2 24 21 - 32 mmol/L    Anion gap 11 5 - 15 mmol/L    Glucose 130 (H) 65 - 100 mg/dL    BUN 10 6 - 20 MG/DL    Creatinine 0.96 0.70 - 1.30 MG/DL    BUN/Creatinine ratio 10 (L) 12 - 20      GFR est AA >60 >60 ml/min/1.41m    GFR est non-AA >60 >60 ml/min/1.77m   Calcium 8.8 8.5 - 10.1 MG/DL    Bilirubin, total 0.4 0.2 - 1.0 MG/DL    ALT (SGPT) 30 12 - 78 U/L    AST (SGOT) 24 15 - 37 U/L    Alk. phosphatase 96 45 - 117 U/L    Protein, total 7.1 6.4 - 8.2 g/dL    Albumin 3.8 3.5 - 5.0 g/dL    Globulin 3.3 2.0 - 4.0 g/dL    A-G Ratio 1.2 1.1 - 2.2     ETHYL ALCOHOL    Collection Time: 05/02/17  9:05 AM   Result Value Ref Range    ALCOHOL(ETHYL),SERUM <10 <10 MG/DL   DRUG SCREEN, URINE    Collection Time: 05/02/17 10:55 AM   Result Value Ref Range    AMPHETAMINES NEGATIVE  NEG      BARBITURATES NEGATIVE  NEG      BENZODIAZEPINES NEGATIVE  NEG      COCAINE NEGATIVE  NEG      METHADONE NEGATIVE  NEG      OPIATES POSITIVE (A) NEG      PCP(PHENCYCLIDINE) NEGATIVE  NEG      THC (TH-CANNABINOL) POSITIVE (A) NEG      Drug screen comment (NOTE)            CHRONIC MEDICAL DIAGNOSES:  Problem List as of 05/02/2017  Date Reviewed: 08/2016-02-28        Codes Class Noted - Resolved    Left rotator cuff tear ICD-10-CM: M75.102  ICD-9-CM: 840.4  05/05/2016 - Present        Epilepsy ([place](Chronic) ICD-10-CM: G40.909  ICD-9-CM: 345.90  05/04/2016 - Present        Seizures (HCSwitz CityICD-10-CM: R56.9  ICD-9-CM: 780.39  05/04/2016 - Present        Non compliance w medication regimen ICD-10-CM: Z91.14  ICD-9-CM: V15.81  05/04/2016 - Present        Seizure (HCGrantICD-10-CM: R56.9  ICD-9-CM: 780.39  04/23/2016 - Present        HTN (hypertension) ICD-10-CM: I10  ICD-9-CM: 401.9  04/23/2016 - Present        TBI (traumatic brain injury) ([place]CL[place](Chronic) ICD-10-CM: S06.9X9A  ICD-9-CM: 854.00  04/23/2016 - Present        Hyperlipidemia ICD-10-CM: E78.5  ICD-9-CM: 272.4  04/23/2016 - Present         Marijuana use ICD-10-CM: F12.90  ICD-9-CM: 305.20  01/19/2016 - Present        Nicotine dependence (Chronic) ICD-10-CM: F17.200  ICD-9-CM: 305.1  01/19/2016 - Present        Symptomatic generalized epilepsy (HCRufusICD-10-CM: G40.409  ICD-9-CM: 345.90  01/18/2016 - Present        History of craniotomy ICD-10-CM: Z98.890  ICD-9-CM: V45.89  01/17/2016 - Present        Unsteady gait ICD-10-CM: R26.81  ICD-9-CM: 781.2  07/20/2015 - Present        Accidental phenytoin poisoning ICD-10-CM: T42.0X1A  ICD-9-CM:  966.1, E855.0  07/19/2015 - Present        Bipolar disorder, unspecified ([place]) ICD-10-CM: F31.9  ICD-9-CM: 296.80  04/13/2015 - Present        Dilantin toxicity ICD-10-CM: T42.0X1A  ICD-9-CM: 966.1, E980.4  04/02/2015 - Present        Bipolar disorder with severe depression ([place]) ICD-10-CM: F31.4  ICD-9-CM: 296.53  02/14/2013 - Present        History of suicidal ideation ICD-10-CM: Z86.59  ICD-9-CM: V11.8  02/11/2013 - Present        Psychosis ([place]) ICD-10-CM: F29  ICD-9-CM: 298.9  02/11/2013 - Present        Localization-related (focal) (partial) epilepsy and epileptic syndromes with complex partial seizures, with intractable epilepsy ICD-10-CM: G40.219  ICD-9-CM: 345.41  01/25/2012 - Present        Non-compliance with treatment (Chronic) ICD-10-CM: Z91.19  ICD-9-CM: V15.81  10/01/2010 - Present        Malingering ICD-10-CM: Z76.5  ICD-9-CM: V65.2  10/01/2010 - Present    Overview Signed 10/01/2010  6:21 PM by Leandra Kern, MD     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             Polysubstance dependence ([place]) (Chronic) ICD-10-CM: F19.20  ICD-9-CM: 304.80  10/01/2010 - Present        Polysubstance overdose ICD-10-CM: T50.901A  ICD-9-CM: 977.9, E980.5 Chronic 08/22/2010 - Present        Adjustment disorder with depressed mood ICD-10-CM: F43.21  ICD-9-CM: 309.0  07/29/2010 - Present        Borderline personality disorder ([place]) ICD-10-CM: F60.3  ICD-9-CM: 301.83  07/29/2010 - Present        RESOLVED: Seizures ([place]) ICD-10-CM: R56.9   ICD-9-CM: 780.39  02/21/2013 - 01/18/2016        RESOLVED: Opioid overdose ([place]) ICD-10-CM: Q73.4L9F  ICD-9-CM: 965.09, E980.0  02/10/2013 - 02/11/2013        RESOLVED: Narcotic overdose ([place]) ICD-10-CM: T40.601A  ICD-9-CM: 967.9, E980.2  02/10/2013 - 02/11/2013                Signed:   Marin [name], MD  05/03/2017  7:40 AM

## 2017-05-02 NOTE — H&P (Signed)
[place] [facility]  HISTORY AND PHYSICAL      Name:[name], [name]  MR#: [id number]  DOB: Dec 02, 1985  ACCOUNT #: [id number]   ADMIT DATE: 05/02/2017    PRIMARY CARE PHYSICIAN[place]  [place].    REASON FOR  ADMISSION:  ? seizures.    HISTORY OF PRESENT ILLNESS:  Patient is a pleasant 31 year old Caucasian gentleman with past medical history significant for seizure disorder, hypertension, bipolar disorder, anxiety, depression, who was in his usual state of health yesterday morning.  He said he went into [facility] Emergency Room.  At that time, he was given Keppra as well as Robaxin for his complaints and was discharged to home from the ER.  I have reviewed the discharge paperwork that he brought in from [facility].  He reports that he went home and he has not filled up his prescriptions, which were given to him.  He was still feeling anxious that he was going to have a seizure and came into the emergency room.  In the ER, the ER doctor has reviewed the case and given the patient Keppra as well as fosphenytoin bolus loading and requested hospitalist to admit the patient for observation.  The patient himself  when I went to see him.  Denied any seizures at this time.  He reports that he thinks he might have had seizures.  He has a husband who he thinks might have seen him, but I do not have the spouse at his bedside to verify if he already had a seizure.  The patient himself denies that he could remember any seizure, but he thinks he might have had a seizure as mentioned earlier.  He denies any fevers or chills.  As mentioned earlier, he has not taken any seizure medicines and he still has his prescriptions with him on hand.  He reports that he had electroconvulsive therapy at [place] in the past and has not been on any seizure medicine since then.  When I saw the patient, he had a foul smelling odor like somebody who has not taken a shower for a few days now.  He also reported that he was  having itching around his groin area, but he was refusing examination of that area.  He denies any fevers or chills.  He denies any bloody stool.  Denies hematemesis or hematochezia.  He reports that he has a neurologist locally but he cannot remember the name.  In the ER, he was loaded up with Keppra and fosphenytoin and was referred to hospitalist for admission.    PAST MEDICAL HISTORY:  As mentioned, seizure disorder, anxiety, bipolar disorder,  hypertension.    ALLERGIES:  THE PATIENT REPORTS THAT HE IS ALLERGIC TO IODINE AND SHELLFISH.    HOME MEDICATIONS:  As per my review includes Keppra as well as Robaxin.      ALLERGIES:  I MENTIONED EARLIER.     FAMILY HISTORY:  Father had depression and cancer.    PAST SURGICAL HISTORY:  He reports that he had a history of right frontal lobectomy.    SOCIAL HISTORY:  He smokes cigarettes.  Denies illicit drug use.  Denies alcohol use.  Lives at home with his husband.    REVIEW OF SYSTEMS:  HEENT:  He is positive for seizure and headaches.  Denies any postictal paralysis.  RESPIRATORY:  No cough, no shortness of breath, no hemoptysis.  CARDIOVASCULAR:  No chest pain, no orthopnea, no palpitations.  GASTROINTESTINAL:  No nausea, vomiting, no diarrhea,  no constipation.  GENITOURINARY:  No dysuria, no urgency, no frequency.  All other review of systems have been performed and are negative.    PHYSICAL EXAMINATION:  VITAL SIGNS:  Show temperature 98.2, pulse rate 83, respiratory rate of 16, blood pressure 144/87, O2 sats are 98% on room air.  GENERAL:  Reveals obese 31 year old Caucasian gentleman, unkempt looking.  He appeared to be in mild distress.  HEENT:  Normocephalic, atraumatic.  Craniotomy scar noted.  EYES:  Normal movement.  Extraocular movements intact.  EARS:  Normal with no evidence of drainage.  NOSE:  No deformity, no drainage.  MOUTH:  No visible oral lesion.  NECK:  Supple, no jugular venous distention.   CHEST:  Clear breath sounds.  No wheezing, no crackles.  HEART:  S1, S2 regular.  No appreciable murmur.  ABDOMEN:  Soft, nontender, normal bowel sounds.  NEUROLOGIC:  Cranial nerve system:  Alert, oriented x3.  No gross focal neurologic  deficits.  EXTREMITIES:  No edema.  Pulses 2+ bilaterally.  MUSCULOSKELETAL:  No gross joint deformity or swelling.  SKIN:  No lesions noted on his exposed part of the body.    GENITOURINARY:  As mentioned earlier, he is refusing examination of genitourinary area even after offering a chaperone.  PSYCHIATRIC:  Normal mood and affect.  LYMPHATIC:  No cervical lymphadenopathy.    LABORATORY DATA:  In the ER were white cells 10,000, hemoglobin 13.2, hematocrit 39.5, platelets 220.  Sodium was 142, potassium was 3.3, his BUN is 10, creatinine 0.96, glucose was 130.  Urine drug screen was ordered and is pending at this time.  Alcohol screen was less than 10.  No imaging studies were performed in the ER.  A CT scan of the head was ordered and is pending at this time.    ASSESSMENT AND PLAN:  1.  This is a patient who presents with what he reports to be breakthrough seizures.  2.  Bipolar disorder.  3.  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  4.  Tobacco abuse disorder.  5.  Questionable dyslipidemia; however, the patient reports he is not on any medications.  6.  History of substance abuse in the past.  Patient denies.  We will check urine drug screen.  7.  Bipolar disorder.  We will request psychiatric consultation for evaluation and management of his bipolar disorder.  8.  Dyslipidemia.  Check lipid panel in a.m. tomorrow.   9.  Deep vein thrombosis prophylaxis. SCD.  10.  CODE STATUS:  FULL CODE.  11.  Tobacco abuse disorder.  Patient advised to quit, place on nicotine patch.      PLAN:  The patient will be admitted under observation to the neurologic floor.  Patient has received Keppra as well as Dilantin in the ER.  We  will request a neurology evaluation for further recommendations on plan of care.  I will defer EEG to neurology.  We will obtain CT scan of the head at this time.  The patient will also be placed on Ativan as needed for seizures.  We will await further recommendations from neurologist.  He will stay in hospital 1-2 midnights.  Followup testing while in the hospital.  Patient was taken in agreement with the plan.  We will continue to follow the patient while in the hospital.      Charlann Lange, MD       JVK/BN  D: 05/02/2017 11:15     T: 05/02/2017 12:11  JOB #:  [id number]

## 2017-05-02 NOTE — Progress Notes (Addendum)
1800; [employer] called on patient. Patient standing in room yelling and screaming at RNs. Irate and not willing to communicate or calm down to talk.   1830: Patient remains upset and will not stay in room. Demanding and yelling things at [employer]. Stating they he has a MSN degree and knows more then we do. Multiple attempts to talk and reorient patient.  1900: Walked patient around unit, when back in room patient was talking to RN and pulled out a 5 inch knife while telling a story. RN informed patient that she would have to call security. Patient was fine with that and handed knife to RN.   1915: Security at bedside to pick up knife, RN also told security that patient had a lighter as well, security said they couldn't take that unless patient gave it up. Patient in hall way walking around, was asked to please remain in room, as there were sick patients and families on our unit. Patient become very angry and agitated and yelled at RN and Consulting civil engineer. Patient could not be calmed down was verbally abusive stating that he was going to report RNs to medical board. RN removed herself from the situation. Change RN and other staff RN, security still with patient.    1957: Patient left AMA.

## 2017-05-02 NOTE — ED Notes (Signed)
Called to treatment area; no answer. Security reports pt. In Hospital waiting area and does not want to come back to the ED.

## 2017-05-02 NOTE — Progress Notes (Signed)
Spiritual Care Assessment/Progress Note  [facility]      NAME: [name]      MRN: [id number]  AGE: 31 y.o. SEX: male  Religious Affiliation: Christian   Language: English     05/02/2017     Total Time (in minutes): 25     Spiritual Assessment begun in [facility] 7S1 INTENSIVE CARE through conversation with:         Patient         Family     Friend(s)        Reason for Consult: Request by patient     Spiritual beliefs: (Please include comment if needed)      Identifies with a faith tradition:          Supported by a faith community:             Claims no spiritual orientation:            Seeking spiritual identity:                 Adheres to an individual form of spirituality:            Not able to assess:                           Identified resources for coping:       Prayer                                Music                   Guided Imagery      Family/friends                  Pet visits      Devotional reading                          Unknown      Other:                                              Interventions offered during this visit: (See comments for more details)    Patient Interventions: Affirmation of emotions/emotional suffering, Affirmation of faith, Coping skills reviewed/reinforced           Plan of Care:      Support spiritual and/or cultural needs     Support AMD and/or advance care planning process       Support grieving process    Coordinate Rites and/or Rituals     Coordination with community clergy    No spiritual needs identified at this time    Detailed Plan of Care below (See Comments)   Make referral to Music Therapy   Make referral to Pet Therapy      Make referral to Addiction services   Make referral to [facility]   Make referral to Spiritual Care Partner   No future visits requested         Follow up visits as needed     Visited pt on ICU at his request. He called the Chaplain's office  indicating that he wanted to continue a conversation he had with a chaplain last night. This did not occur  as he was not a patient until this morning and no chaplain has visited with him today. At the very onset of the visit he immediately launched into a disjointed account of where he lives and those with whom he lives. He interspersed his current (?) situation with past situations and people without the benefit of transitions. Consulted with pt's nurse, Elon Jester. She indicated that a psych evaluation was working. Chaplains will follow as needed.  Clancy Gourd, Chaplain, MDiv, MS, [facility]  [phone] [phone])

## 2017-05-02 NOTE — Progress Notes (Signed)
Called into his pharmacy and reordered his home medications.Ativan Prn for agitation. Hold for sedation.

## 2017-05-02 NOTE — ED Notes (Signed)
Called to treatment area; no answer.
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
## 2017-05-02 NOTE — Other (Signed)
TRANSFER - OUT REPORT:    Verbal report given to Jessica, RN(name) on [name]  being transferred to ICU(unit) for routine progression of care       Report consisted of patient???s Situation, Background, Assessment and   Recommendations(SBAR).     Information from the following report(s) SBAR, ED Summary and Recent Results was reviewed with the receiving nurse.    Lines:   Peripheral IV 05/02/17 Right Antecubital (Active)        Opportunity for questions and clarification was provided.      Patient transported with:  Monitor, RN, Technical sales engineerfficer

## 2017-05-02 NOTE — H&P (Signed)
H and P # [id number]

## 2017-05-02 NOTE — ED Notes (Signed)
Chris Paramedic at bedside to insert PIV.

## 2017-05-02 NOTE — Progress Notes (Addendum)
1200: Patient arrived to ICU. VSS. Neurology and pysc consults called.   1330: Patient impulsive and getting out of bed, RN attempted to put bed alarm on. Patient became irrate. Nursing supervisor called to bedside. Patient was explained the importance of a bed alarm. Patient still refusing bed alarm. RN will keep a close eye on patient. Charge nurse is aware.

## 2017-05-02 NOTE — Discharge Summary (Signed)
Discharge Summary by [name], MD at 05/02/17 1945                Author: Marin [name], MD  Service: Internal Medicine  Author Type: Physician       Filed: 05/03/17 1546  Date of Service: 05/02/17 1945  Status: Signed          Editor: [name], MD (Physician)                                                                                    Discharge Summary           PATIENT ID: [name]   MRN: [id number]     DATE OF BIRTH: 01/03/1986      DATE OF ADMISSION: 05/02/2017  8:48 AM      DATE OF DISCHARGE:  05/03/17   PRIMARY CARE PROVIDER:  None          DISCHARGING PROVIDER:  Marin [name], MD     To contact this individual call [phone] and ask the operator to page.  If unavailable ask to be transferred the Adult Hospitalist Department.      CONSULTATIONS: IP CONSULT TO NEUROLOGY         PROCEDURES/SURGERIES: * No surgery found *      IMAGING   Ct Head Wo Cont      Result Date: 05/02/2017   IMPRESSION: 1. No evidence of acute intracranial abnormality. 2. Unchanged right anterior frontal encephalomalacia.            HPI:   Patient is a pleasant 31 year old Caucasian gentleman with past medical history significant for seizure disorder, hypertension, bipolar disorder, anxiety,  depression, who was in his usual state of health yesterday morning.  He said he went into [facility] Emergency Room.  At that time, he was given Keppra as well as Robaxin for his complaints and was discharged to home from the ER.  I have reviewed the discharge  paperwork that he brought in from [facility].  He reports that he went home and he has not filled up his prescriptions, which were given to him.  He was still feeling anxious that he was going to have a seizure and came into the emergency room.  In the ER,  the ER doctor has reviewed the case and given the patient Keppra as well as fosphenytoin bolus loading and requested hospitalist to admit the patient for observation.  The patient himself  when I  went to see him.  Denied any seizures at this time.   He reports that he thinks he might have had seizures.  He has a husband who he thinks might have seen him, but I do not have the spouse at his bedside to verify if he already had a seizure.  The patient himself denies that he could remember any seizure,  but he thinks he might have had a seizure as mentioned earlier.  He denies any fevers or chills.  As mentioned earlier, he has not taken any seizure medicines and he still has his prescriptions with him on hand.  He reports that he had electroconvulsive  therapy at  [place] in the past and has not been on any seizure medicine since then.  When I saw the patient, he had a foul smelling odor like somebody who has not taken a shower for a few days now.  He also reported that he was having itching around  his groin area, but he was refusing examination of that area.  He denies any fevers or chills.  He denies any bloody stool.  Denies hematemesis or hematochezia.  He reports that he has a neurologist locally but he cannot remember the name.  In the  ER, he was loaded up with Keppra and fosphenytoin and was referred to hospitalist for admission.   ??         HOSPITAL COURSE:    patient was admitted to telemetry. Ct scan head was negative for acute abnormality. Neurology evaluated and recommended him keppra 1022m po bid. He was advised to be under observation, Psychiatry evaluated  and recommended him to be on same medications for mood control as Op, - this was reported to me by nurse(Consult note pending at the time of this note).   Overnight patient decided to leave [facility] despite staff appearing to advise him of the risk of seizures and death if he leaves. He went [facility].               DISCHARGE DIAGNOSES / P LAN:           Patient Active Problem List        Diagnosis  Code         ?  Adjustment disorder with depressed mood  F43.21     ?  Borderline personality disorder ([place]  F60.3     ?  Polysubstance overdose  T50.901A      ?  Non-compliance with treatment  Z91.19     ?  Malingering  Z76.5     ?  Polysubstance dependence ([place]  F19.20     ?  Localization-related (focal) (partial) epilepsy and epileptic syndromes with complex partial seizures, with intractable epilepsy  G40.219     ?  History of suicidal ideation  Z86.59     ?  Psychosis ([place]  F29     ?  Bipolar disorder with severe depression ([place]  F31.4     ?  Dilantin toxicity  T42.0X1A     ?  Bipolar disorder, unspecified (HF[place]  F31.9     ?  Accidental phenytoin poisoning  T42.0X1A     ?  Unsteady gait  R26.81     ?  History of craniotomy  Z98.890     ?  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  Marijuana use  F12.90     ?  Nicotine dependence  F17.200     ?  Seizure ([place]  R56.9     ?  HTN (hypertension)  I10     ?  TBI (traumatic brain injury) ([place]  S06.9X9A     ?  Hyperlipidemia  E78.5     ?  Epilepsy ([place]  G40.909     ?  Seizures ([place]  R56.9     ?  Non compliance w medication regimen  Z91.14         ?  Left rotator cuff tear  M75.102                   Recent Results (from the past 24 hour(s))     SAMPLES BEING HELD  Collection Time: 05/02/17  9:04 AM         Result  Value  Ref Range            SAMPLES BEING HELD  1RED 1BLU 1UC         COMMENT                  Add-on orders for these samples will be processed based on acceptable specimen integrity and analyte stability, which may vary by analyte.       CBC WITH AUTOMATED DIFF          Collection Time: 05/02/17  9:05 AM         Result  Value  Ref Range            WBC  10.3  4.1 - 11.1 K/uL       RBC  4.44  4.10 - 5.70 M/uL       HGB  13.2  12.1 - 17.0 g/dL       HCT  39.5  36.6 - 50.3 %       MCV  89.0  80.0 - 99.0 FL       MCH  29.7  26.0 - 34.0 PG       MCHC  33.4  30.0 - 36.5 g/dL       RDW  13.0  11.5 - 14.5 %       PLATELET  220  150 - 400 K/uL       MPV  11.4  8.9 - 12.9 FL       NRBC  0.0  0 PER 100 WBC       ABSOLUTE NRBC  0.00  0.00 - 0.01 K/uL       NEUTROPHILS  70  32 - 75 %        LYMPHOCYTES  22  12 - 49 %       MONOCYTES  5  5 - 13 %       EOSINOPHILS  2  0 - 7 %       BASOPHILS  0  0 - 1 %       IMMATURE GRANULOCYTES  0  0.0 - 0.5 %       ABS. NEUTROPHILS  7.2  1.8 - 8.0 K/UL       ABS. LYMPHOCYTES  2.3  0.8 - 3.5 K/UL       ABS. MONOCYTES  0.6  0.0 - 1.0 K/UL       ABS. EOSINOPHILS  0.2  0.0 - 0.4 K/UL       ABS. BASOPHILS  0.0  0.0 - 0.1 K/UL       ABS. IMM. GRANS.  0.0  0.00 - 0.04 K/UL       DF  AUTOMATED          METABOLIC PANEL, COMPREHENSIVE          Collection Time: 05/02/17  9:05 AM         Result  Value  Ref Range            Sodium  142  136 - 145 mmol/L       Potassium  3.3 (L)  3.5 - 5.1 mmol/L       Chloride  107  97 - 108 mmol/L       CO2  24  21 - 32 mmol/L       Anion gap  11  5 -  15 mmol/L       Glucose  130 (H)  65 - 100 mg/dL       BUN  10  6 - 20 MG/DL       Creatinine  0.96  0.70 - 1.30 MG/DL       BUN/Creatinine ratio  10 (L)  12 - 20         GFR est AA  >60  >60 ml/min/1.59m       GFR est non-AA  >60  >60 ml/min/1.713m      Calcium  8.8  8.5 - 10.1 MG/DL       Bilirubin, total  0.4  0.2 - 1.0 MG/DL       ALT (SGPT)  30  12 - 78 U/L       AST (SGOT)  24  15 - 37 U/L       Alk. phosphatase  96  45 - 117 U/L       Protein, total  7.1  6.4 - 8.2 g/dL       Albumin  3.8  3.5 - 5.0 g/dL       Globulin  3.3  2.0 - 4.0 g/dL       A-G Ratio  1.2  1.1 - 2.2         ETHYL ALCOHOL          Collection Time: 05/02/17  9:05 AM         Result  Value  Ref Range            ALCOHOL(ETHYL),SERUM  <10  <10 MG/DL       DRUG SCREEN, URINE          Collection Time: 05/02/17 10:55 AM         Result  Value  Ref Range            AMPHETAMINES  NEGATIVE   NEG         BARBITURATES  NEGATIVE   NEG         BENZODIAZEPINES  NEGATIVE   NEG         COCAINE  NEGATIVE   NEG         METHADONE  NEGATIVE   NEG         OPIATES  POSITIVE (A)  NEG         PCP(PHENCYCLIDINE)  NEGATIVE   NEG         THC (TH-CANNABINOL)  POSITIVE (A)  NEG              Drug screen comment  (NOTE)                   CHRONIC  MEDICAL DIAGNOSES:      Problem List as of 05/02/2017   Date Reviewed:  08/2016-02-19              Codes  Class  Noted - Resolved             Left rotator cuff tear  ICD-10-CM: M75.102   ICD-9-CM: 840.4    05/05/2016 - Present                       Epilepsy (HCA[place](Chronic)  ICD-10-CM: G40.909   ICD-9-CM: 345.90    05/04/2016 - Present                       Seizures (H[place]  ICD-10-CM: R56.9   ICD-9-CM: 780.39    05/04/2016 - Present                       Non compliance w medication regimen  ICD-10-CM: Z91.14   ICD-9-CM: V15.81    05/04/2016 - Present                       Seizure ([place])  ICD-10-CM: R56.9   ICD-9-CM: 780.39    04/23/2016 - Present                       HTN (hypertension)  ICD-10-CM: I10   ICD-9-CM: 401.9    04/23/2016 - Present                       TBI (traumatic brain injury) ([place]) (Chronic)  ICD-10-CM: S06.9X9A   ICD-9-CM: 854.00    04/23/2016 - Present                       Hyperlipidemia  ICD-10-CM: E78.5   ICD-9-CM: 272.4    04/23/2016 - Present                       Marijuana use  ICD-10-CM: F12.90   ICD-9-CM: 305.20    01/19/2016 - Present                       Nicotine dependence (Chronic)  ICD-10-CM: F17.200   ICD-9-CM: 305.1    01/19/2016 - Present                       Symptomatic generalized epilepsy ([place])  ICD-10-CM: G40.409   ICD-9-CM: 345.90    01/18/2016 - Present                       History of craniotomy  ICD-10-CM: Z98.890   ICD-9-CM: V45.89    01/17/2016 - Present                       Unsteady gait  ICD-10-CM: R26.81   ICD-9-CM: 781.2    07/20/2015 - Present                       Accidental phenytoin poisoning  ICD-10-CM: T42.0X1A   ICD-9-CM: 966.1, E855.0    07/19/2015 - Present                       Bipolar disorder, unspecified ([place])  ICD-10-CM: F31.9   ICD-9-CM: 296.80    04/13/2015 - Present                       Dilantin toxicity  ICD-10-CM: T42.0X1A   ICD-9-CM: 966.1, E980.4    04/02/2015 - Present                       Bipolar disorder with severe depression ([place])  ICD-10-CM: F31.4    ICD-9-CM: 296.53    02/14/2013 - Present                       History of suicidal ideation  ICD-10-CM: Z86.59   ICD-9-CM: V11.8    02/11/2013 - Present  Psychosis ([place])  ICD-10-CM: F29   ICD-9-CM: 298.9    02/11/2013 - Present                       Localization-related (focal) (partial) epilepsy and epileptic syndromes with complex partial seizures, with intractable epilepsy  ICD-10-CM: G40.219   ICD-9-CM: 345.41    01/25/2012 - Present                       Non-compliance with treatment (Chronic)  ICD-10-CM: Z91.19   ICD-9-CM: V15.81    10/01/2010 - Present                       Malingering  ICD-10-CM: Z76.5   ICD-9-CM: V65.2    10/01/2010 - Present          Overview Signed 10/01/2010  6:21 PM by Leandra Kern, MD            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                                   Polysubstance dependence ([place]) (Chronic)  ICD-10-CM: F19.20   ICD-9-CM: 304.80    10/01/2010 - Present                       Polysubstance overdose  ICD-10-CM: T50.901A   ICD-9-CM: 977.9, E980.5  Chronic  08/22/2010 - Present                       Adjustment disorder with depressed mood  ICD-10-CM: F43.21   ICD-9-CM: 309.0    07/29/2010 - Present                       Borderline personality disorder ([place])  ICD-10-CM: F60.3   ICD-9-CM: 301.83    07/29/2010 - Present                       RESOLVED: Seizures ([place])  ICD-10-CM: R56.9   ICD-9-CM: 780.39    02/21/2013 - 01/18/2016                       RESOLVED: Opioid overdose ([place])  ICD-10-CM: T73.2K0U   ICD-9-CM: 965.09, E980.0    02/10/2013 - 02/11/2013                       RESOLVED: Narcotic overdose ([place])  ICD-10-CM: T40.601A   ICD-9-CM: 967.9, E980.2    02/10/2013 - 02/11/2013                             Signed:    Marin [name], MD   05/03/2017   7:40 AM
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
## 2017-05-03 MED FILL — LEVETIRACETAM 500 MG/5 ML IV SOLN: 500 mg/5 mL | INTRAVENOUS | Qty: 10

## 2022-01-15 DIAGNOSIS — R569 Unspecified convulsions: Principal | ICD-10-CM

## 2022-01-15 DIAGNOSIS — R45851 Suicidal ideations: Secondary | ICD-10-CM
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
## 2022-01-15 NOTE — Discharge Instructions (Signed)
[place] [facility]    Accepts Insured Patients Only:  [facility]  [address]       [phone]   Near the corner of [address] in the [place].  Accepts most insurance including Medicaid/Medicare.  No psychiatry.  On the [place] bus line.    [facility]  [address] ([place])     [phone]  [address] ([place]     [phone]  [address] ([place])     [phone]   Accepts most major insurances.  Psychiatry available.  Some DBT groups.    [facility] ([place])    [phone]   Mixture of psychologists and psychiatrists.  They do not accept Medicaid or Medicare.    [facility]  [address]       [phone]   Mixture of clinical social workers and psychologists.      Sliding Scale/Financial Aid/Differing Payment Options  [facility]  [address] ([place])      [phone]   Our own Ariel Ham and Robyn Nunley.  Variety of treatment options, including DBT.    [facility]  [address]       [phone]   Provides a variety of group and individual counseling options.  Insurance, Medicaid, Medicare and sliding scale      Medicaid/Medicare providers in the [address] area  [facility]  [address]       [phone]    [facility]         [address]       [phone]    [facility]  [address] [place] [address]    [phone]      Community Service Boards  [facility]     [phone]    [facility]      [phone]    [facility]       [phone]    [place] [facility]      [phone]      Services for patients without Medicaid, Medicare or Insurance  [facility]       [phone]   See handout in separate folder    [facility]    [phone]

## 2022-01-15 NOTE — ED Provider Notes (Shared)
EMERGENCY DEPARTMENT HISTORY AND PHYSICAL EXAM            Please note that this dictation was completed with the assistance of "Dragon", the computer voice recognition software. Quite often unanticipated grammatical, syntax, homophones, and other interpretive errors are inadvertently transcribed by the computer software. Please disregard these errors and any errors that have escaped final proofreading. Thank you.    Date of Evaluation: 01/16/22  Patient: [name]  Patient Age and Sex: 36 y.o. male   MRN: [id number]  CSN: [id number]  PCP: No primary care provider on file.    History of Present Illness     Chief Complaint   Patient presents with    Suicidal     Pt states he has had SI today with 'multiple plans' denies meds for depression, denies being seen by provider for depression.        History Provided By: Patient/family/EMS (if available)    History is limited by: {History Limitations:55983::"Nothing"}     HPI: [name], 36 y.o. male with past medical history as documented below presents to the ED with c/o of ***. Pt denies any other exacerbating or ameliorating factors. There are no other complaints, changes or physical findings pertinent to the HPI at this time.    Nursing Notes were all reviewed and agreed with or any disagreements were addressed in the HPI.    Past History   Past Medical History:  No past medical history on file.    Past Surgical History:  No past surgical history on file.    Family History:   Family history reviewed and was non-contributory, unless specified below:  No family history on file.    Social History:       Allergies:  Allergies   Allergen Reactions    Iodine     Nitrous Oxide     Shellfish Allergy     Succinylcholine        Current Medications:  No current facility-administered medications on file prior to encounter.     No current outpatient medications on file prior to encounter.       Review of Systems   A complete ROS was reviewed by me today and all  other systems negative, unless otherwise specified below:  Review of Systems   Constitutional:  Negative for chills and fever.   HENT:  Negative for rhinorrhea and sore throat.    Eyes:  Negative for pain and visual disturbance.   Respiratory:  Negative for cough and shortness of breath.    Cardiovascular:  Negative for chest pain.   Gastrointestinal:  Negative for abdominal pain, diarrhea, nausea and vomiting.   Musculoskeletal:  Negative for arthralgias and myalgias.   Skin:  Negative for rash.   Neurological:  Negative for speech difficulty, light-headedness and headaches.   Psychiatric/Behavioral:  Negative for agitation and confusion.    Physical Exam     Vitals:    01/15/22 2123   BP: (!) 143/96   Pulse: 87   Temp: 98.4 F (36.9 C)   SpO2: 95%       Physical Exam  Vitals and nursing note reviewed.   Constitutional:       Appearance: Normal appearance.   HENT:      Head: Normocephalic and atraumatic.      Nose: Nose normal.      Mouth/Throat:      Mouth: Mucous membranes are moist.   Eyes:      Extraocular Movements: Extraocular movements intact.  Conjunctiva/sclera: Conjunctivae normal.   Cardiovascular:      Rate and Rhythm: Normal rate and regular rhythm.   Pulmonary:      Effort: Pulmonary effort is normal. No respiratory distress.   Abdominal:      General: Abdomen is flat. There is no distension.      Tenderness: There is no abdominal tenderness.   Musculoskeletal:         General: Normal range of motion.      Cervical back: Normal range of motion.   Skin:     General: Skin is warm.   Neurological:      General: No focal deficit present.      Mental Status: He is alert. Mental status is at baseline.   Psychiatric:         Mood and Affect: Mood normal.     Diagnostic Studies     LABORATORY RESULTS:  I have personally reviewed and interpreted all available laboratory results.   No results found for this or any previous visit (from the past 24 hour(s)).    RADIOLOGY RESULTS:  I have personally reviewed  and interpreted all available imaging studies and agree with radiology interpretation.  No orders to display       No orders to display     MEDICAL DECISION MAKING & ED COURSE   I am the first and primary ED physician for this patient's ED visit today.    I reviewed our EMR for any past records that may contribute to the patient's current condition, including their past medical, surgical, social and family history. This also includes their most recent ED visits, previous hospitalizations and prior diagnostic data. I have reviewed and summarized the most pertinent findings in my HPI and MDM.    Vital Signs Reviewed:  Patient Vitals for the past 24 hrs:   Temp Pulse BP SpO2   01/15/22 2123 98.4 F (36.9 C) 87 (!) 143/96 95 %     Pulse Oximetry Analysis: ***% on RA with good pleth    Cardiac Monitor:   Rate: *** bpm  The cardiac monitor revealed the following rhythm as interpreted by me: Normal Sinus Rhythm  Cardiac and pulse ox monitoring were ordered to monitor patient for signs of cardiac dysrhythmia, which they are at risk for based on their history and/or risk for cardiovascular disease and/or metabolic abnormalities.     ED EKG interpretation:  Billable EKG reviewed by ED Physician in the absence of a cardiologist: Yes  Rhythm: {[facility] ED EKG RHYTHM:42342}; and {[facility] ED EKG RATE:40396}. Rate (approx.): ***; Axis: {[facility] ED EKG AXIS DEVIATION:40394}; P wave: {[facility] ED EKG P WAVE:40390}; QRS interval: {[facility] ED EKG QRS INTERVAL:40391}; ST/T wave: {[facility] ED EKG ST/T ZOXW:96045}; Other findings: {[facility] ED EKG OTHER WUJWJXBJ:47829}. This EKG was interpreted by Jed Limerick, M.D.    Records Reviewed: Nursing Notes, Old Medical Records, Previous electrocardiograms, Previous Radiology Studies and Previous Laboratory Studies, EMS reports    DIFFERENTIAL DIAGNOSIS AND PLAN:  ***    Pertinent Chronic Medical Conditions Affecting Care:  No past medical history on file.  ***  Review of Prior Records and External  Documents:  ***    Independent History:   An independent clinically history was obtained. *** states ***    Social Determinants of Health Affecting Dx or Tx:  {Social Barriers (Optional):57537}  Social Determinants of Health with Concerns     Tobacco Use: Not on file   Financial Resource Strain: Not on file  Food Insecurity: Not on file   Transportation Needs: Not on file   Physical Activity: Not on file   Stress: Not on file   Social Connections: Not on file   Intimate Partner Violence: Not on file   Depression: Not on file   Housing Stability: Not on file        ***  ED Course: Progress Notes, Reevaluation, and Consults:  Initial assessment performed. I discussed presenting problems and concerns, and my formulated plan for today's visit with the patient and any available family members. I have encouraged them to ask questions as they arise throughout the visit.     ED Physician Orders:   Orders Placed This Encounter   Procedures    COVID-19 & Influenza Combo    CBC with Auto Differential    Comprehensive Metabolic Panel    Ethanol    Urine Drug Screen    Urinalysis with Reflex to Culture    IP CONSULT TO BSMART     ED Medications Administered:   Medications - No data to display  Pt received IV/IM medications per above and placed on appropriate cardiac/respiratory monitoring due to drug toxicity.    ED Physician Interpretation of Test Results:  All results were independently reviewed and interpreted by myself, notably showing:     RADIOLOGY:  Non-plain film images such as CT, ultrasound and MRI are read by the radiologist. Plain radiographic images are visualized and preliminarily interpreted by the ED Provider with the below findings:     Imaging interpreted by me: ***    Interpretation per the Radiologist below, if available at the time of this note:  No orders to display       My interpretation of EKG: No STEMI.  See my EKG interpretation in ED course above.    My interpretation of laboratory results:    ***    Amount and/or Complexity of Data Reviewed  HIGH complexity decision making performed   Presentation: ACUTE and SEVERE  Clinical lab tests: ordered as appropriate & reviewed  Tests in the radiology section of CPT: ordered as appropriate & reviewed  Tests in the medicine section of CPT: ordered as appropriate & reviewed  Obtain history from someone other than the patient: yes  Review and summarize past medical records: yes  Independent visualization of images, tracings, or specimens: yes    Risks  OTC drugs.  Prescription drug management.  Parenteral controlled substances.  Drug therapy requiring intensive monitoring for toxicity.  Decision regarding hospitalization.     Progress Note:  I have just re-evaluated the patient. Pt reports improvement of his symptoms after ***. I have reviewed his vital signs and determined there is currently no worsening in their condition or physical exam. Results have been reviewed with them and their questions have been answered. I will continue to review further results as they come available.         CONSULTS:   IP CONSULT TO BSMART  Shared Decision Making:   I considered performing ***, but did not after shared decision making with patient due to ***.     I considered admission/observation for patient's ***. I engaged patient in shared decision making and he feels significant improvement after ED treatment and is comfortable with discharge with outpatient treatment and PCP/Specialist follow-up.     DISCHARGE AGAINST MEDICAL ADVICE (AMA):  [name] has decided to leave AMA. The patient was advised that he has the diagnoses listed below. he was offered further care (including: {  AMA Benefits:55951}). The patient is competent, understands my concerns for not abiding by recommendations (including {AMA Consequences:55949::"worsening condition/further deterioration","permanent disability/disfigurement","arrhythmia","MI","paralysis","pain/Suffering","coma","death"}).  I have attempted to engage the {Patient or Family:54397::"patient"} in decision-making. I offered further follow-up here as needed. The patient was instructed to follow up with his Primary Care Provider within 24 hours. The patient understands that he is able to return here for further evaluation at anytime or if symptoms worsen.     PLAN  1. Return precautions as discussed with patient and available family/caregiver.     2. DISCHARGE MEDICATIONS:     Medication List      You have not been prescribed any medications.           3. PATIENT REFERRED TO:  [facility]  [address]  [place] [address]  [phone]    As needed, If symptoms worsen                            CLINICAL IMPRESSION     1. Suicidal ideations    2. Left against medical advice      Attestation:  I am the attending of record for this patient. I personally performed the services described in this documentation on this date, 01/15/2022 for patient, [name]. I have reviewed the chart and verified that the record is accurate and complete.      Jed Limerick, MD (Electronic Signature)

## 2022-01-15 NOTE — ED Notes (Signed)
Patient requested to speak with administrator after speaking with ED charge RN.  This RN along with attending ED provider Modesto Charon both spoke to patient, as patient voiced concerns.  Patient stated he was not happy with the care that has been provided to him and that he was upset that he had been here for 1.5 hours and no lab work or anything had been done.  Per primary RN, patient initially did not want IV for blood work and requested butterfly stick.  Primary RN states she made MD aware of this who stated to wait for blood work until patient spoke with [facility].  Pt requesting to be discharged to be seen at another facility stating, "it is my right".  MD explained the risks of leaving to patient.  This RN apologized for patient not receiving the appropriate level of care.  Patient was provided discharge paperwork and signed appropriate AMA paperwork with MD and this RN as witness.  Patient was discharged with friend/family member safely out of facility.         Fae Pippin, RN  01/15/22 [phone]

## 2022-01-15 NOTE — ED Notes (Signed)
Around 2215 the primary RN went to the patient's room to collect all lab work. The patient states that the primary RN can only attempt to stick one vein on his left hand on between the 4th and 5th fingers. Patient states he is an ultrasound IV stick and that he does not want the primary RN to look at any other veins. Primary RN went to start the IV in the specified vein and the patient pulls his hand back, stating he does not want an IV "just for blood." Primary RN explains that we use an IV in case the patient was to need any medications/fluids while here, and that we could avoid future needlestick pokes. Patient makes wishes clear, RN offered to do a straight stick needle to obtain blood.     Primary RN checked in with MD to update on patient status, and was advised by the MD to hold off on lab work until [facility] was able to talk with the patient. The primary RN also asked if MD wanted the patient to go ahead and receive ativan order at that time or if he wanted to wait until [facility] could talk with the patient. MD advised the primary RN to hold off on the ativan as to not disrupt the psych exam for BSMART.    Upon time for the primary RN to check back in with the patient to update on the plan of care, and saw ED charge RN at bedside speaking with the patient.     2300: Patient has left AMA at this time.      Lindley Magnus, RN  01/15/22 2317

## 2022-01-16 ENCOUNTER — Inpatient Hospital Stay
Admit: 2022-01-16 | Discharge: 2022-01-16 | Disposition: A | Discharge status: Home / Self Care | Payer: MEDICAID | Attending: Emergency Medicine

## 2022-01-16 MED ORDER — LORAZEPAM 1 MG PO TABS
1 | Freq: Once | ORAL | Status: DC
Start: 2022-01-16 — End: 2022-01-16

## 2022-01-16 MED FILL — LORAZEPAM 1 MG PO TABS: 1 MG | ORAL | Qty: 1

## 2022-02-05 ENCOUNTER — Inpatient Hospital Stay
Admission: EM | Admit: 2022-02-05 | Discharge: 2022-02-09 | Disposition: A | Discharge status: Still In Hospital | Payer: PRIVATE HEALTH INSURANCE | Admitting: Psychiatry

## 2022-02-05 DIAGNOSIS — R45851 Suicidal ideations: Secondary | ICD-10-CM

## 2022-02-05 DIAGNOSIS — F332 Major depressive disorder, recurrent severe without psychotic features: Principal | ICD-10-CM

## 2022-02-05 LAB — CBC WITH AUTO DIFFERENTIAL
Absolute Immature Granulocyte: 0 10*3/uL (ref 0.00–0.04)
Basophils %: 1 % (ref 0–1)
Basophils Absolute: 0 10*3/uL (ref 0.0–0.1)
Eosinophils %: 2 % (ref 0–7)
Eosinophils Absolute: 0.2 10*3/uL (ref 0.0–0.4)
Hematocrit: 43.3 % (ref 36.6–50.3)
Hemoglobin: 14.5 g/dL (ref 12.1–17.0)
Immature Granulocytes: 0 % (ref 0.0–0.5)
Lymphocytes %: 32 % (ref 12–49)
Lymphocytes Absolute: 2.8 10*3/uL (ref 0.8–3.5)
MCH: 29.7 PG (ref 26.0–34.0)
MCHC: 33.5 g/dL (ref 30.0–36.5)
MCV: 88.5 FL (ref 80.0–99.0)
MPV: 10.3 FL (ref 8.9–12.9)
Monocytes %: 9 % (ref 5–13)
Monocytes Absolute: 0.8 10*3/uL (ref 0.0–1.0)
Neutrophils %: 56 % (ref 32–75)
Neutrophils Absolute: 5 10*3/uL (ref 1.8–8.0)
Nucleated RBCs: 0 PER 100 WBC
Platelets: 251 10*3/uL (ref 150–400)
RBC: 4.89 M/uL (ref 4.10–5.70)
RDW: 12.8 % (ref 11.5–14.5)
WBC: 8.8 10*3/uL (ref 4.1–11.1)
nRBC: 0 10*3/uL (ref 0.00–0.01)

## 2022-02-05 LAB — COMPREHENSIVE METABOLIC PANEL
ALT: 38 U/L (ref 12–78)
AST: 16 U/L (ref 15–37)
Albumin/Globulin Ratio: 1.1 (ref 1.1–2.2)
Albumin: 4 g/dL (ref 3.5–5.0)
Alk Phosphatase: 90 U/L (ref 45–117)
Anion Gap: 4 mmol/L — ABNORMAL LOW (ref 5–15)
BUN: 13 MG/DL (ref 6–20)
Bun/Cre Ratio: 16 (ref 12–20)
CO2: 28 mmol/L (ref 21–32)
Calcium: 9.1 MG/DL (ref 8.5–10.1)
Chloride: 108 mmol/L (ref 97–108)
Creatinine: 0.83 MG/DL (ref 0.70–1.30)
Est, Glom Filt Rate: >60 mL/min/{1.73_m2} (ref >60)
Globulin: 3.5 g/dL (ref 2.0–4.0)
Glucose: 100 mg/dL (ref 65–100)
Potassium: 4 mmol/L (ref 3.5–5.1)
Sodium: 140 mmol/L (ref 136–145)
Total Bilirubin: 0.4 MG/DL (ref 0.2–1.0)
Total Protein: 7.5 g/dL (ref 6.4–8.2)

## 2022-02-05 LAB — EXTRA TUBES HOLD

## 2022-02-05 LAB — COVID-19 & INFLUENZA COMBO
Rapid Influenza A By PCR: NOT DETECTED
Rapid Influenza B By PCR: NOT DETECTED
SARS-CoV-2, PCR: NOT DETECTED

## 2022-02-05 LAB — ETHANOL: Ethanol Lvl: <10 MG/DL (ref <10)

## 2022-02-05 MED ORDER — LAMOTRIGINE 25 MG PO TABS
25 MG | ORAL | Status: AC
Start: 2022-02-05 — End: 2022-02-05
  Administered 2022-02-05: 20:00:00 50 mg via ORAL

## 2022-02-05 MED ORDER — LEVETIRACETAM 500 MG/5ML IV SOLN
500 MG/5ML | Freq: Two times a day (BID) | INTRAVENOUS | Status: AC
Start: 2022-02-05 — End: 2022-02-06
  Administered 2022-02-05: 20:00:00 1000 mg via INTRAVENOUS

## 2022-02-05 MED FILL — LAMOTRIGINE 25 MG PO TABS: 25 MG | ORAL | Qty: 2

## 2022-02-05 MED FILL — LEVETIRACETAM 500 MG/5ML IV SOLN: 500 MG/5ML | INTRAVENOUS | Qty: 10

## 2022-02-05 NOTE — Other (Signed)
Bsmart on way to bedside.

## 2022-02-05 NOTE — ED Triage Notes (Signed)
Headache and lightheaded for one hour and feels like he is going to have a seizure. Has missed several doses of his seizure meds due to family crisis. BS 134

## 2022-02-05 NOTE — ED Provider Notes (Signed)
I was asked by nursing staff to review patient's work-up to determine if he is medically cleared for psychiatric admission.  No acute abnormalities identified on work-up.  Patient is medically cleared and stable for psychiatric admission.     Francoise Ceo, DO  02/06/22 0006

## 2022-02-05 NOTE — Other (Signed)
BSMART assessment completed, and suicide risk level noted to be Moderate to HIGH. Primary Nurse NA and Charge Nurse Toniann Fail and Physician Dr. Dareen Piano notified. Concerns not observed.    Security/Off-duty Officer has not been notified.

## 2022-02-05 NOTE — ED Provider Notes (Signed)
[facility] EMERGENCY DEP  EMERGENCY DEPARTMENT ENCOUNTER      Pt Name: [name]  MRN: [id number]  Birthdate 03/29/1986  Date of evaluation: 02/05/2022  Provider: Casimiro Needle, MD    CHIEF COMPLAINT       Chief Complaint   Patient presents with    Seizures         HISTORY OF PRESENT ILLNESS   (Location/Symptom, Timing/Onset, Context/Setting, Quality, Duration, Modifying Factors, Severity)  Note limiting factors.   Patient is a 36 year old male presenting to the emergency department for initially concerns of headaches lightheadedness and a sensation that he is going to have a seizure.  Patient states that he is on Lamictal and Keppra and has not taken his antiseizure medications for the last 2 days.  However during exam patient states that the real reason he is here is he has been depressed and that he has been having thoughts of suicide for the last 2 days with a plan.  Patient states that he would overdose on medications.  Patient denies any chest pain, shortness of breath recent illnesses.          Review of External Medical Records:     Nursing Notes were reviewed.    REVIEW OF SYSTEMS    (2-9 systems for level 4, 10 or more for level 5)     Review of Systems    Except as noted above the remainder of the review of systems was reviewed and negative.       PAST MEDICAL HISTORY   No past medical history on file.      SURGICAL HISTORY     No past surgical history on file.      CURRENT MEDICATIONS       Previous Medications    No medications on file       ALLERGIES     Iodine, Nitrous oxide, Shellfish allergy, and Succinylcholine    FAMILY HISTORY     No family history on file.       SOCIAL HISTORY       Social History     Socioeconomic History    Marital status: Single           PHYSICAL EXAM    (up to 7 for level 4, 8 or more for level 5)     ED Triage Vitals [02/05/22 1230]   BP Temp Temp Source Pulse Respirations SpO2 Height Weight   128/76 97.9 F (36.6 C) Oral 67 22 100 % -- --       There is no  height or weight on file to calculate BMI.    Physical Exam  Vitals and nursing note reviewed.   Constitutional:       Appearance: Normal appearance.   HENT:      Head: Normocephalic and atraumatic.   Eyes:      Extraocular Movements: Extraocular movements intact.      Pupils: Pupils are equal, round, and reactive to light.   Cardiovascular:      Rate and Rhythm: Normal rate and regular rhythm.      Pulses: Normal pulses.      Heart sounds: Normal heart sounds.   Pulmonary:      Effort: Pulmonary effort is normal.      Breath sounds: Normal breath sounds.   Abdominal:      General: Abdomen is flat.      Palpations: Abdomen is soft.   Musculoskeletal:  General: Normal range of motion.      Cervical back: Normal range of motion and neck supple.   Skin:     General: Skin is warm and dry.   Neurological:      General: No focal deficit present.      Mental Status: He is alert and oriented to person, place, and time.   Psychiatric:         Attention and Perception: He is inattentive.         Mood and Affect: Mood is depressed. Affect is flat and tearful.         Speech: Speech is delayed.         Behavior: Behavior is slowed and withdrawn.         Thought Content: Thought content includes suicidal ideation. Thought content includes suicidal plan.       DIAGNOSTIC RESULTS     EKG: All EKG's are interpreted by the Emergency Department Physician who either signs or Co-signs this chart in the absence of a cardiologist.        RADIOLOGY:   Non-plain film images such as CT, Ultrasound and MRI are read by the radiologist. Plain radiographic images are visualized and preliminarily interpreted by the emergency physician with the below findings:        Interpretation per the Radiologist below, if available at the time of this note:    No orders to display        LABS:  Labs Reviewed   COMPREHENSIVE METABOLIC PANEL - Abnormal; Notable for the following components:       Result Value    Anion Gap 4 (*)     All other components  within normal limits   URINE DRUG SCREEN - Abnormal; Notable for the following components:    THC, TH-Cannabinol, Urine Positive (*)     All other components within normal limits   COVID-19 & INFLUENZA COMBO   URINE CULTURE HOLD SAMPLE   CBC WITH AUTO DIFFERENTIAL   EXTRA TUBES HOLD   ETHANOL   LAMOTRIGINE LEVEL   LEVETIRACETAM LEVEL       All other labs were within normal range or not returned as of this dictation.    EMERGENCY DEPARTMENT COURSE and DIFFERENTIAL DIAGNOSIS/MDM:   Vitals:    Vitals:    02/05/22 1230 02/05/22 2200   BP: 128/76 130/81   Pulse: 67 70   Resp: 22 18   Temp: 97.9 F (36.6 C) 98 F (36.7 C)   TempSrc: Oral    SpO2: 100% 100%           Medical Decision Making  Major depressive disorder with suicidal thoughts.  36 year old male present emergency department with concerns of depression and suicidal thoughts for the last 2 days.  Patient states that he has a plan.  Patient has been medically cleared patient will require admission for psychiatric evaluation.    Amount and/or Complexity of Data Reviewed  Labs: ordered.    Risk  Prescription drug management.  Decision regarding hospitalization.            REASSESSMENT            CONSULTS:  IP CONSULT TO BSMART    PROCEDURES:  Unless otherwise noted below, none     Procedures      FINAL IMPRESSION      1. Suicidal ideations    2. Severe episode of recurrent major depressive disorder, without psychotic features (HCC)  DISPOSITION/PLAN   DISPOSITION Admitted 02/05/2022 09:20:21 PM      PATIENT REFERRED TO:  No follow-up provider specified.    DISCHARGE MEDICATIONS:  New Prescriptions    No medications on file         (Please note that portions of this note were completed with a voice recognition program.  Efforts were made to edit the dictations but occasionally words are mis-transcribed.)    Casimiro Needle, MD (electronically signed)  Emergency Attending Physician / Physician Assistant / Nurse Practitioner      Total critical care time  (not including time spent performing separately reportable procedures): 52 min         Casimiro Needle, MD  02/05/22 2300
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
## 2022-02-05 NOTE — ED Notes (Signed)
Patient is refusing arterial stick and reports he wants to wait for an US guided IV.     Darcus Pester, RN  02/05/22 1314

## 2022-02-06 LAB — URINE DRUG SCREEN
Amphetamine, Urine: NEGATIVE
Barbiturates, Urine: NEGATIVE
Benzodiazepines, Urine: NEGATIVE
Cocaine, Urine: NEGATIVE
Methadone, Urine: NEGATIVE
Opiates, Urine: NEGATIVE
PCP, Urine: NEGATIVE
THC, TH-Cannabinol, Urine: POSITIVE — AB

## 2022-02-06 LAB — URINE CULTURE HOLD SAMPLE

## 2022-02-06 LAB — LAMOTRIGINE LEVEL: Lamotrigine: 2.7 ug/mL (ref 2.0–20.0)

## 2022-02-06 MED ORDER — POLYETHYLENE GLYCOL 3350 17 G PO PACK
17 g | Freq: Every day | ORAL | Status: AC | PRN
Start: 2022-02-06 — End: 2022-02-09

## 2022-02-06 MED ORDER — TRAZODONE HCL 50 MG PO TABS
50 MG | Freq: Every evening | ORAL | Status: AC | PRN
Start: 2022-02-06 — End: 2022-02-08

## 2022-02-06 MED ORDER — ARIPIPRAZOLE 2 MG PO TABS
2 MG | Freq: Once | ORAL | Status: AC
Start: 2022-02-06 — End: 2022-02-06
  Administered 2022-02-06: 18:00:00 2 mg via ORAL

## 2022-02-06 MED ORDER — LAMOTRIGINE 100 MG PO TABS
100 MG | Freq: Once | ORAL | Status: AC
Start: 2022-02-06 — End: 2022-02-06
  Administered 2022-02-06: 18:00:00 100 mg via ORAL

## 2022-02-06 MED ORDER — LAMOTRIGINE 25 MG PO TABS
25 MG | Freq: Every day | ORAL | Status: DC
Start: 2022-02-06 — End: 2022-02-06
  Administered 2022-02-06: 14:00:00 50 mg via ORAL

## 2022-02-06 MED ORDER — NICOTINE POLACRILEX 4 MG MT LOZG
4 MG | OROMUCOSAL | Status: AC | PRN
Start: 2022-02-06 — End: 2022-02-09
  Administered 2022-02-06 – 2022-02-09 (×8): 4 mg via ORAL

## 2022-02-06 MED ORDER — ALUM & MAG HYDROXIDE-SIMETH 200-200-20 MG/5ML PO SUSP
200-200-20 MG/5ML | Freq: Four times a day (QID) | ORAL | Status: AC | PRN
Start: 2022-02-06 — End: 2022-02-09

## 2022-02-06 MED ORDER — LAMOTRIGINE 100 MG PO TABS
100 MG | Freq: Two times a day (BID) | ORAL | Status: AC
Start: 2022-02-06 — End: 2022-02-09
  Administered 2022-02-07 – 2022-02-09 (×6): 200 mg via ORAL

## 2022-02-06 MED ORDER — LEVETIRACETAM 500 MG PO TABS
500 MG | Freq: Two times a day (BID) | ORAL | Status: AC
Start: 2022-02-06 — End: 2022-02-09
  Administered 2022-02-06 – 2022-02-09 (×7): 1000 mg via ORAL

## 2022-02-06 MED ORDER — HYDROXYZINE HCL 50 MG PO TABS
50 MG | Freq: Three times a day (TID) | ORAL | Status: AC | PRN
Start: 2022-02-06 — End: 2022-02-09
  Administered 2022-02-09: 100 mg via ORAL

## 2022-02-06 MED ORDER — DIPHENHYDRAMINE HCL 50 MG/ML IJ SOLN
50 MG/ML | INTRAMUSCULAR | Status: AC | PRN
Start: 2022-02-06 — End: 2022-02-09

## 2022-02-06 MED ORDER — HALOPERIDOL LACTATE 5 MG/ML IJ SOLN
5 MG/ML | INTRAMUSCULAR | Status: AC | PRN
Start: 2022-02-06 — End: 2022-02-09

## 2022-02-06 MED ORDER — ARIPIPRAZOLE 5 MG PO TABS
5 MG | Freq: Every day | ORAL | Status: AC
Start: 2022-02-06 — End: 2022-02-07
  Administered 2022-02-07: 13:00:00 5 mg via ORAL

## 2022-02-06 MED ORDER — SENNOSIDES 8.6 MG PO TABS
8.6 MG | Freq: Every day | ORAL | Status: AC | PRN
Start: 2022-02-06 — End: 2022-02-09

## 2022-02-06 MED ORDER — HYDROXYZINE HCL 50 MG PO TABS
50 MG | Freq: Three times a day (TID) | ORAL | Status: DC | PRN
Start: 2022-02-06 — End: 2022-02-06
  Administered 2022-02-06: 14:00:00 50 mg via ORAL

## 2022-02-06 MED ORDER — HALOPERIDOL 5 MG PO TABS
5 MG | ORAL | Status: AC | PRN
Start: 2022-02-06 — End: 2022-02-09
  Administered 2022-02-07 – 2022-02-08 (×2): 5 mg via ORAL

## 2022-02-06 MED FILL — NICOTINE POLACRILEX 4 MG MT LOZG: 4 MG | OROMUCOSAL | Qty: 1

## 2022-02-06 MED FILL — HYDROXYZINE HCL 50 MG PO TABS: 50 MG | ORAL | Qty: 1

## 2022-02-06 MED FILL — ARIPIPRAZOLE 2 MG PO TABS: 2 MG | ORAL | Qty: 1

## 2022-02-06 MED FILL — LAMOTRIGINE 100 MG PO TABS: 100 MG | ORAL | Qty: 1

## 2022-02-06 MED FILL — LAMOTRIGINE 25 MG PO TABS: 25 MG | ORAL | Qty: 2

## 2022-02-06 MED FILL — LEVETIRACETAM 500 MG/5ML IV SOLN: 500 MG/5ML | INTRAVENOUS | Qty: 10

## 2022-02-06 MED FILL — LEVETIRACETAM 500 MG PO TABS: 500 MG | ORAL | Qty: 2

## 2022-02-06 NOTE — Plan of Care (Signed)
Pt is irritable and states that he wants to die but does not have a plan to harm self. Reports feeling hopeless. He remains isolative.  Problem: Depression  Goal: Will be euthymic at discharge  Description: INTERVENTIONS:  1. Administer medication as ordered  2. Provide emotional support via 1:1 interaction with staff  3. Encourage involvement in milieu/groups/activities  4. Monitor for social isolation  Outcome: Not Progressing

## 2022-02-06 NOTE — Behavioral Health Treatment Team (Signed)
Interdisciplinary Rounds Note         Patient goal(s) for today: Communicate needs to staff   Treatment team focus/goals: Assess pt, manage medications, discharge planning   Progress note: Pts mood is depressed. Pt is teary. Pts medications have been adjusted. Pt needs psych and therapist. Pt is irritable and isolative.     LOS:  1  Expected LOS: TBD     Financial concerns/prescription coverage:  none  Family contact:       Family requesting physician contact today:  No  Discharge plan: TBD   Guns in the home: no         Outpatient provider(s): none     Participating treatment team members: [name], [place], MSW, Chinita Greenland, RN, Dr. Lennox Grumbles

## 2022-02-06 NOTE — Behavioral Health Treatment Team (Signed)
PSYCHOSOCIAL ASSESSMENT  :Patient identifying info:   [name] is a 36 y.o., male admitted 02/05/2022  2:19 PM     Presenting problem and precipitating factors: Pt reported to ED due to increased depressive symptoms and SI with plan to OD. Pt denies HI, AVH.     Mental status assessment:     Strengths/Recreation/Coping Skills:    Collateral information:     Current psychiatric /substance abuse providers and contact info: unknown     Previous psychiatric/substance abuse providers and response to treatment: unknown    Family history of mental illness or substance abuse: unknown     Substance abuse history:  [facility]   Social History     Tobacco Use    Smoking status: Every Day     Packs/day: 1.00     Types: Cigarettes    Smokeless tobacco: Not on file   Substance Use Topics    Alcohol use: Never       History of biomedical complications associated with substance abuse: none    Patient's current acceptance of treatment or motivation for change: voluntary     Family constellation: unknown     Is significant other involved? No     Describe support system:     Describe living arrangements and home environment:     GUARDIAN/POA: No    Guardian Name:     Guardian Contact:     Health issues:     Trauma history: no    Legal issues: no    History of military service:     Financial status: unknown     Religious/cultural factors: not reported     Education/work history:     Have you been licensed as a Clinical cytogeneticist (current or expired): no     Describe coping skills: Ineffectual     [name]  02/06/2022

## 2022-02-06 NOTE — Progress Notes (Signed)
Admission Medication Reconciliation:    Information obtained from:  communication with [facility] (phone number: [phone]), review of EMR, and patient report to provider  RxQuery data available:  NO    Comments/Recommendations: Updated PTA meds/reviewed patient's allergies.    1)  Pharmacist reviewed patient chart (including external records) in detail. He was hospitalized at [facility] 4/2-10/28/21 and discharged on sertraline ([facility]) and of note was not given antiepileptic medications during that hospitalization. [facility] was called to confirm medications - patient never picked up discharge medications (currently on hold) and no records of lamotrigine/levetiracetam within last 2 years per [facility] corporate records. The most recent record available that lists lamotrigine/levetircetam was from [facility] on 05/23/21. The pharmacist is unable to confirm seizure medications but the patient reported to provider that he last took them 2 days ago.     2)  Medication changes (since last review):  Added  - lamotrigine 200 mg BID - patient reported  - levetiracetam 1000 mg BID - patient reported     RxQuery pharmacy benefit data reflects medications filled and processed through the patient's insurance, however this data does NOT capture whether the medication was picked up or is currently being taken by the patient.    Allergies:  Iodine, Nitrous oxide, Shellfish allergy, and Succinylcholine    Significant PMH/Disease States: History reviewed. No pertinent past medical history.    Chief Complaint for this Admission:    Chief Complaint   Patient presents with    Seizures     Prior to Admission Medications:   Prior to Admission Medications   Prescriptions Last Dose Informant Patient Reported? Taking?   lamoTRIgine (LAMICTAL) 200 MG tablet  Self Yes Yes   Sig: Take 1 tablet by mouth 2 times daily   levETIRAcetam (KEPPRA) 1000 MG tablet  Self Yes Yes   Sig: Take 1 tablet by mouth 2 times daily      Facility-Administered  Medications: None     Ramonita Lab, [facility]

## 2022-02-06 NOTE — ED Notes (Signed)
TRANSFER - IN REPORT:    Verbal report received from [place] on [place]  being received from ED Margo Aye F for routine progression of patient care      Report consisted of patient's Situation, Background, Assessment and   Recommendations(SBAR).     Information from the following report(s) Nurse Handoff Report, Index, ED Encounter Summary, ED SBAR, Adult Overview, MAR, and Recent Results was reviewed with the receiving nurse.    Opportunity for questions and clarification was provided.      Assessment completed upon patient's arrival to unit and care assumed.        Dallas Schimke, RN  02/06/22 0001

## 2022-02-06 NOTE — H&P (Addendum)
PSYCHIATRY EVALUATION NOTE    CHIEF COMPLAINT:  "Dealt with depression all my life."    HISTORY OF PRESENTING COMPLAINT:  [name] is a 36 y.o. White (non-Hispanic) male who is currently admitted to the general side of 7th floor behavioral health Unit at [facility].     Patient says that he usually keep his emotions to himself and doesn't divulge how terrible he feels to others. However at this time he feels so low that he can't function even at his work. He is experiencing passive death wish and is afraid to end his life because of how low he feels. He's had several past suicide attempts, with one being serious enough to the extent that he was admitted to the ICU 3 years after an overdose. He describes experiencing anhedonia and low frustration tolerance. This hasn't affected his sleep or appetite, but it definitely impacted his function.     No psychosis. No mania.  No fire-arms at home    PAST PSYCHIATRIC HISTORY   Several past inpatient psychiatric admissions, last being at [facility] in April.  1 suicide attempts, with overdose last being 3 years. Was in ICU.    Therapy since 2000  No psychiastrist,  Last time seen last year.    Tried prozac 40mg  po for years  Zoloft unsure dose or length  Effexor unsure of dose or length  Tried cymbalta    Tried risperdal years ago as a teenager  No abilify,   Tired zyprexa  Can't tried seroquel, says his mother told him that it made him 'mean.'    ECT at [facility], which he felt it helped (2014-2016). He later recalled that it may have been [facility], not [facility].    SUBSTANCE USE HISTORY:  Tobacco: 1ppd  Cannabis: cannabis since 36yo  Alcohol: very rare.  IVD: none  No Cocaine/Heroin/amphetamines/LSD/PCP/Ketamine    PAST MEDICAL HISTORY:    Please see H&P for details.     History reviewed. No pertinent past medical history.  Prior to Admission medications    Not on File     Lab Results   Component Value Date    WBC 8.8 02/05/2022    HGB 14.5 02/05/2022    HCT 43.3 02/05/2022     MCV 88.5 02/05/2022    PLT 251 02/05/2022     Lab Results   Component Value Date    NA 140 02/05/2022    K 4.0 02/05/2022    CL 108 02/05/2022    CO2 28 02/05/2022    BUN 13 02/05/2022    CREATININE 0.83 02/05/2022    GLUCOSE 100 02/05/2022    CALCIUM 9.1 02/05/2022    PROT 7.5 02/05/2022    LABALBU 4.0 02/05/2022    BILITOT 0.4 02/05/2022    ALKPHOS 90 02/05/2022    AST 16 02/05/2022    ALT 38 02/05/2022    LABGLOM >60 02/05/2022    GLOB 3.5 02/05/2022       No results found for: VALAC, VALP, CARB2  No results found for: LITHM  RADIOLOGY REPORTS:(reviewed/updated 02/06/2022)    FAMILY HISTORY:  Paternal uncle completed suicide.  MDD runs in family.  No Substance use in the family    PSYCHOSOCIAL HISTORY:  Lives alone.  Masters degree in business.  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  No children.  12 siblings  No firearms.  Moved in march.    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 He is engaged  in the evaluation and maintains good eye contact throughout.  Speech: Spontaneous, has NL rate, volume and prosody.  Thought Process is Logical, linear, and goal directed.  Mood is reported as "so depressed"  Affect is congruent and dysthymic  Passive death wish, but no suicidal intents or plans.   No Homicidal thoughts, intents or plans. Denies access to fire-arms  Denies experiencing hallucinations of any type.  Perception is negative for paranoia or delusional thinking  Attention/Concentration are both intact  Recent/Remote memories are intact per answers to my evaluation questions.  Insight is poor. Judgment is poor  Cognition: Intact grossly.     ASSESSMENT:  [name] meets criteria for a diagnosis of   .    MDD, Recurrent, Severe, w/o psychosis  Tobacco use d/o.    PLAN:  Recommend inpatient psychiatric admission to mitigate the risk of further decompensation.  Will obtain pertinent labs and collateral information.Encourage patient to participate in programming and  group activities.  Medication Regimen As follows:  Discussed past medication trials and patient isn't interested in re-trying SSRIs or SNRIs. We discussed starting abilify since it is the only antipsychotic he hasn't yet tried. We'll start abilify 2mg  po once, then go up to 5mg  po qday starting tomorrow.  is thinking about ECT and would like an opportunity to discuss this with his family.  Patient has been taking keppra 1gram po bid as well as lamictal 200mg  po bid consistently. Will restart.    I certify that this patients inpatient psychiatric hospital services furnished since the previous certification were, and continue to be, required for treatment that could reasonably be expected to improve the patient's condition, or for diagnostic study, and that the patient continues to need, on a daily basis, active treatment furnished directly by or requiring the supervision of inpatient psychiatric facility personnel. In addition, the hospital records show that services furnished were intensive treatment services, admission or related services, or equivalent services.      A coordinated, multidisplinary treatment team round was conducted with the patient, nurses, pharmcist, present. Discussions held with case manager, and/or with family members; Complete current electronic health record for patient was reviewed in full including consultant notes, ancillary staff notes, nurses and tech notes, labs and vitals.

## 2022-02-06 NOTE — Behavioral Health Treatment Team (Signed)
Admission unit: General      Received from:  [facility] ED      Admission diagnosis: Depression, SI      Admission status: Voluntary      Mood/ affect/ thought process / behaviors: cooperative, participated in admission process.      Alcohol/drug:    PTA meds verified:    PT or consults required:       Skin Assessment:        Josefina Rynders RN and Marcelino Duster, RN  performed a dual skin assessment on this patient.  Pt skin is intact but it appears that he has birth marks on his right thigh and old scar on his right elbow . He also has tattoo on his right lower arm.       No pressure ulcer noted    Bertil Brickey Y. RN

## 2022-02-06 NOTE — Behavioral Health Treatment Team (Signed)
TRANSFER - IN REPORT:    Verbal report received from [facility] on Narvel Kozub  being received from [facility] ED  for routine progression of patient care    Report consisted of patient's Situation, Background, Assessment and   Recommendations(SBAR).     Information from the following report(s) Nurse Handoff Report, ED SBAR, and Recent Results was reviewed with the receiving nurse.    Opportunity for questions and clarification was provided.      Assessment completed upon patient's arrival to unit and care assumed.

## 2022-02-06 NOTE — Behavioral Health Treatment Team (Addendum)
10:21: PRN Medication Documentation  Specific patient behavior that led to need for PRN medication: Anxiety   Staff interventions attempted prior to PRN being given: listening, encouragement, distraction, education   PRN medication given: Atarax

## 2022-02-06 NOTE — Progress Notes (Signed)
Behavioral Services  Medicare Certification Upon Admission    I certify that this patient's inpatient psychiatric hospital admission is medically necessary for:    [x]  (1) Treatment which could reasonably be expected to improve this patient's condition,       []  (2) Or for diagnostic study;     AND     [x] (2) The inpatient psychiatric services are provided while the individual is under the care of a physician and are included in the individualized plan of care.    Estimated length of stay/service 2-4 days    Plan for post-hospital care Outpatient Psychiatry    Electronically signed by , MD on 02/06/2022 at 12:30 PM

## 2022-02-07 ENCOUNTER — Inpatient Hospital Stay: Payer: PRIVATE HEALTH INSURANCE

## 2022-02-07 ENCOUNTER — Inpatient Hospital Stay: Admit: 2022-02-07 | Payer: PRIVATE HEALTH INSURANCE

## 2022-02-07 LAB — EKG 12-LEAD
Atrial Rate: 58 {beats}/min
P Axis: 53 degrees
P-R Interval: 158 ms
Q-T Interval: 422 ms
QRS Duration: 100 ms
QTc Calculation (Bazett): 414 ms
R Axis: -13 degrees
T Axis: 8 degrees
Ventricular Rate: 58 {beats}/min

## 2022-02-07 LAB — LEVETIRACETAM LEVEL: Levetiracetam: 2.4 ug/mL — ABNORMAL LOW (ref 10.0–40.0)

## 2022-02-07 MED ORDER — ARIPIPRAZOLE 10 MG PO TABS
10 MG | Freq: Every day | ORAL | Status: AC
Start: 2022-02-07 — End: 2022-02-08
  Administered 2022-02-08: 13:00:00 10 mg via ORAL

## 2022-02-07 MED FILL — LEVETIRACETAM 500 MG PO TABS: 500 MG | ORAL | Qty: 2

## 2022-02-07 MED FILL — LAMOTRIGINE 100 MG PO TABS: 100 MG | ORAL | Qty: 2

## 2022-02-07 MED FILL — NICOTINE POLACRILEX 4 MG MT LOZG: 4 MG | OROMUCOSAL | Qty: 1

## 2022-02-07 MED FILL — ARIPIPRAZOLE 5 MG PO TABS: 5 MG | ORAL | Qty: 1

## 2022-02-07 MED FILL — HALOPERIDOL 5 MG PO TABS: 5 MG | ORAL | Qty: 1

## 2022-02-07 NOTE — Plan of Care (Signed)
Problem: Safety - Adult  Goal: Free from fall injury  Outcome: Progressing    Resting in bed with eyes closed, no complaints, no distress noted.  Safety measures in place, will continue to monitor.

## 2022-02-07 NOTE — Plan of Care (Addendum)
Problem: Depression  Goal: Will be euthymic at discharge  Description: INTERVENTIONS:  1. Administer medication as ordered  2. Provide emotional support via 1:1 interaction with staff  3. Encourage involvement in milieu/groups/activities  4. Monitor for social isolation  02/07/2022 1542 by Landry Dyke, RN  Outcome: Progressing    1837: Notified X-ray department of pending order. Olegario Messier informed Clinical research associate X-ray will be completed today.

## 2022-02-07 NOTE — Behavioral Health Treatment Team (Signed)
Pt slept all night with no behavioral issues and safety concern. Monitored on Q 15 minutes safety checks.

## 2022-02-07 NOTE — Behavioral Health Treatment Team (Addendum)
Chief Complaint:  I am not feeling okay    Length of Stay: 2 Days    Interval History:  Mr. [name] reports feeling "still very depressed". He recalled the author from treatment with ECT at [facility] more than 7 years ago. He seems to remember developing sensitivty to Succinylcholine although this is not clear based on his history. He has a history of having had manic/hypomanic episodes and would meet criteria for Bipolar 1 Disorder. Says he slept fairly well last night and woke up feeling somewhat rested. At the present time the patient [name] remains compliant with taking medications. Denies any adverse events from taking them and feels they have been beneficial.       Past Medical History:  History reviewed. No pertinent past medical history.        Labs:  Lab Results   Component Value Date/Time    WBC 8.8 02/05/2022 02:07 PM    HGB 14.5 02/05/2022 02:07 PM    HCT 43.3 02/05/2022 02:07 PM    PLT 251 02/05/2022 02:07 PM    MCV 88.5 02/05/2022 02:07 PM      Lab Results   Component Value Date/Time    NA 140 02/05/2022 02:07 PM    K 4.0 02/05/2022 02:07 PM    CL 108 02/05/2022 02:07 PM    CO2 28 02/05/2022 02:07 PM    BUN 13 02/05/2022 02:07 PM    GLOB 3.5 02/05/2022 02:07 PM    ALT 38 02/05/2022 02:07 PM      @DCDT @      Current Facility-Administered Medications   Medication Dose Route Frequency Provider Last Rate Last Admin    polyethylene glycol (GLYCOLAX) packet 17 g  17 g Oral Daily PRN , APRN - NP        senna (SENOKOT) tablet 8.6 mg  1 tablet Oral Daily PRN Pia Mau, APRN - NP        aluminum & magnesium hydroxide-simethicone (MAALOX) 200-200-20 MG/5ML suspension 30 mL  30 mL Oral Q6H PRN Pia Mau, APRN - NP        haloperidol (HALDOL) tablet 5 mg  5 mg Oral Q4H PRN Pia Mau, APRN - NP   5 mg at 02/06/22 2047    Or    haloperidol lactate (HALDOL) injection 5 mg  5 mg IntraMUSCular Q4H PRN 2048, APRN - NP        diphenhydrAMINE (BENADRYL) injection 50 mg  50 mg  IntraMUSCular Q4H PRN Pia Mau, APRN - NP        traZODone (DESYREL) tablet 50 mg  50 mg Oral Nightly PRN Pia Mau, APRN - NP        nicotine polacrilex (COMMIT) lozenge 4 mg  4 mg Oral Q1H PRN Pia Mau, APRN - NP   4 mg at 02/07/22 0902    levETIRAcetam (KEPPRA) tablet 1,000 mg  1,000 mg Oral BID 02/09/22, APRN - NP   1,000 mg at 02/07/22 02/09/22    hydrOXYzine HCl (ATARAX) tablet 100 mg  100 mg Oral TID PRN 9629, MD        lamoTRIgine (LAMICTAL) tablet 200 mg  200 mg Oral BID Frankey Poot, MD   200 mg at 02/07/22 0902    ARIPiprazole (ABILIFY) tablet 5 mg  5 mg Oral Daily 02/09/22, MD   5 mg at 02/07/22 0902         Mental Status Exam:  Eye contact: fair  Grooming: fair  Psychomotor activity: WNL  Speech is spontaneous  Mood is "okay"  Affect: Depressed  Perception: Denies any SI or pla  Suicidal ideation: Passive SI+  Cognition is grossly intact    Vitals:    02/07/22 1122   BP: 123/86   Pulse: 71   Resp: 14   Temp: 97.9 F (36.6 C)   SpO2: 97%        Physical Exam:  Body habitus: Body mass index is 37.96 kg/m.  Musculoskeletal system: normal gait  Tremor - neg  Cog wheeling - neg      Assessment and Plan:  [name] meets criteria for a diagnosis of Bipolar 1 Disorder, MRE MDD, Recurrent, Severe, w/o psychosis  Tobacco use d/o.  He is willing to consider ECT and we discussed the risks and benefits of the procedure. I will start the transfer process to [facility] for this. Discussed the difficulties with doing ECT while on 2 prescribed antiepileptics.   Increased Abilify to 10mg  daily.   Continue the medication regimen as prescribed  Disposition planning to continue.   A coordinated, multidisplinary treatment team round was conducted with the patient, nurses, pharmcist, present. Discussions held with case manager, and/or with family members; Complete current electronic health record for patient was reviewed in full including consultant notes, ancillary staff  notes, nurses and tech notes, labs and vitals.  I certify that this patients inpatient psychiatric hospital services furnished since the previous certification were, and continue to be, required for treatment that could reasonably be expected to improve the patient's condition, or for diagnostic study, and that the patient continues to need, on a daily basis, active treatment furnished directly by or requiring the supervision of inpatient psychiatric facility personnel. In addition, the hospital records show that services furnished were intensive treatment services, admission or related services, or equivalent services.

## 2022-02-07 NOTE — Behavioral Health Treatment Team (Signed)
Unsuccessful attempt made to draw labs. Patient is willing to retry at a later time. Re-timing labs for 02/08/22 @ 0600.

## 2022-02-07 NOTE — Plan of Care (Addendum)
13:28-pt and Dr. Karie Mainland discussed ECT during treatment team. Pt feels it may benefit him to have ECT. Feels it has helped in the past.   Problem: Depression  Goal: Will be euthymic at discharge  Description: INTERVENTIONS:  1. Administer medication as ordered  2. Provide emotional support via 1:1 interaction with staff  3. Encourage involvement in milieu/groups/activities  4. Monitor for social isolation  Outcome: Progressing  Note: Pt out on the unit and interacting with peers and staff appropriately. Ate breakfast and took scheduled medications.  Less irritable today. Took a shower during the shift. No thoughts of self harm currently.    [employer]  Master Treatment Plan for [name]    Date Treatment Plan Initiated: 02/07/22    Treatment Plan Modalities:  Type of Modality Amount  (x minutes) Frequency (x/week) Duration (x days) Name of Responsible Staff   Community & wrap-up meetings to encourage peer interactions 15 7 1  Charnetta, BHT     Group psychotherapy to assist in building coping skills and internal controls 60 7 1 MSW   Therapeutic activity groups to build coping skills 60 7 1 [place] MSW   Psychoeducation in group setting to address:   Medication education   15 7 1  [place] PharmD   Coping skills   30 3 1  , Dannielle Karvonen   Relaxation techniques         Symptom management         Discharge planning   60 2 1 , Katie   Spirituality    60 2 1 Chaplain    NAMI   60 1 1 Volunteer of NAMI   Recovery/AA/NA         Physician medication management   15 7 1  Dr. Scherrie Merritts   Family meeting/discharge planning   15 2 1  Patrcia Dolly

## 2022-02-07 NOTE — Behavioral Health Treatment Team (Signed)
PRN Medication Documentation    Specific patient behavior that led to need for PRN medication: racing thoughts  Staff interventions attempted prior to PRN being given: coping skills  PRN medication given: haldol  Patient response/effectiveness of PRN medication: tl aware

## 2022-02-08 MED ORDER — DOXEPIN HCL 25 MG PO CAPS
25 MG | Freq: Every evening | ORAL | Status: AC
Start: 2022-02-08 — End: 2022-02-09
  Administered 2022-02-09: 01:00:00 25 mg via ORAL

## 2022-02-08 MED ORDER — ARIPIPRAZOLE 10 MG PO TABS
10 MG | Freq: Every day | ORAL | Status: AC
Start: 2022-02-08 — End: 2022-02-09
  Administered 2022-02-09: 13:00:00 15 mg via ORAL

## 2022-02-08 MED FILL — HALOPERIDOL 5 MG PO TABS: 5 MG | ORAL | Qty: 1

## 2022-02-08 MED FILL — DOXEPIN HCL 25 MG PO CAPS: 25 MG | ORAL | Qty: 1

## 2022-02-08 MED FILL — LEVETIRACETAM 500 MG PO TABS: 500 MG | ORAL | Qty: 2

## 2022-02-08 MED FILL — NICOTINE POLACRILEX 4 MG MT LOZG: 4 MG | OROMUCOSAL | Qty: 1

## 2022-02-08 MED FILL — LAMOTRIGINE 100 MG PO TABS: 100 MG | ORAL | Qty: 2

## 2022-02-08 MED FILL — ARIPIPRAZOLE 10 MG PO TABS: 10 MG | ORAL | Qty: 1

## 2022-02-08 NOTE — Behavioral Health Treatment Team (Signed)
Chief Complaint:  I am not feeling okay    Length of Stay: 3 Days    Interval History:  02/08/22  Mr. [name] reports feeling "the same". States that he struggled with sleep and Trazodone makes him feel "weird". His mood remains depressed and hopeless. He has remained isolative to his room and feels he has no energy. Passive SI remains but denies any active plan. Remains motivated to get ECT. At the present time the patient [name] remains compliant with taking medications. Denies any adverse events from taking them and feels they have been slightly beneficial.       02/07/22  Mr. [name] reports feeling "still very depressed". He recalled the author from treatment with ECT at [facility] more than 7 years ago. He seems to remember developing sensitivty to Succinylcholine although this is not clear based on his history. He has a history of having had manic/hypomanic episodes and would meet criteria for Bipolar 1 Disorder. Says he slept fairly well last night and woke up feeling somewhat rested. At the present time the patient [name] remains compliant with taking medications. Denies any adverse events from taking them and feels they have been beneficial.       Past Medical History:  History reviewed. No pertinent past medical history.        Labs:  Lab Results   Component Value Date/Time    WBC 8.8 02/05/2022 02:07 PM    HGB 14.5 02/05/2022 02:07 PM    HCT 43.3 02/05/2022 02:07 PM    PLT 251 02/05/2022 02:07 PM    MCV 88.5 02/05/2022 02:07 PM      Lab Results   Component Value Date/Time    NA 140 02/05/2022 02:07 PM    K 4.0 02/05/2022 02:07 PM    CL 108 02/05/2022 02:07 PM    CO2 28 02/05/2022 02:07 PM    BUN 13 02/05/2022 02:07 PM    GLOB 3.5 02/05/2022 02:07 PM    ALT 38 02/05/2022 02:07 PM      @DCDT @      Current Facility-Administered Medications   Medication Dose Route Frequency Provider Last Rate Last Admin    [START ON 02/09/2022] ARIPiprazole (ABILIFY) tablet 15 mg  15 mg  Oral Daily Diana Armijo 02/11/2022, MD        doxepin (SINEQUAN) capsule 25 mg  25 mg Oral Nightly Onetha Gaffey Sheliah Plane, MD        polyethylene glycol (GLYCOLAX) packet 17 g  17 g Oral Daily PRN Sheliah Plane, APRN - NP        senna (SENOKOT) tablet 8.6 mg  1 tablet Oral Daily PRN Pia Mau, APRN - NP        aluminum & magnesium hydroxide-simethicone (MAALOX) 200-200-20 MG/5ML suspension 30 mL  30 mL Oral Q6H PRN Pia Mau, APRN - NP        haloperidol (HALDOL) tablet 5 mg  5 mg Oral Q4H PRN Pia Mau, APRN - NP   5 mg at 02/07/22 2118    Or    haloperidol lactate (HALDOL) injection 5 mg  5 mg IntraMUSCular Q4H PRN 2119, APRN - NP        diphenhydrAMINE (BENADRYL) injection 50 mg  50 mg IntraMUSCular Q4H PRN Pia Mau, APRN - NP        nicotine polacrilex (COMMIT) lozenge 4 mg  4 mg Oral Q1H PRN Pia Mau, APRN - NP   4 mg at 02/07/22 0902    levETIRAcetam (  KEPPRA) tablet 1,000 mg  1,000 mg Oral BID Violeta Gelinas, APRN - NP   1,000 mg at 02/08/22 0920    hydrOXYzine HCl (ATARAX) tablet 100 mg  100 mg Oral TID PRN Frankey Poot, MD        lamoTRIgine (LAMICTAL) tablet 200 mg  200 mg Oral BID Frankey Poot, MD   200 mg at 02/08/22 0920         Mental Status Exam:  Eye contact: fair  Grooming: fair  Psychomotor activity: WNL  Speech is spontaneous  Mood is "okay"  Affect: Depressed  Perception: Denies any SI or pla  Suicidal ideation: Passive SI+  Cognition is grossly intact    Vitals:    02/08/22 1113   BP: 130/81   Pulse: 69   Resp: 16   Temp: 98.3 F (36.8 C)   SpO2:         Physical Exam:  Body habitus: Body mass index is 37.41 kg/m.  Musculoskeletal system: normal gait  Tremor - neg  Cog wheeling - neg      Assessment and Plan:  [name] meets criteria for a diagnosis of Bipolar 1 Disorder, MRE MDD, Recurrent, Severe, w/o psychosis  Tobacco use d/o.  He is willing to consider ECT and we discussed the risks and benefits of the procedure. I will start the transfer process to [facility] for this. Discussed the  difficulties with doing ECT while on 2 prescribed antiepileptics.   Increased Abilify to 15mg  daily.   Switch to Doxepin for sleep at bedtime. Risk of hypomanic/manic switch remains low at low doses.   Continue the medication regimen as prescribed  Disposition planning to continue.   A coordinated, multidisplinary treatment team round was conducted with the patient, nurses, pharmcist, present. Discussions held with case manager, and/or with family members; Complete current electronic health record for patient was reviewed in full including consultant notes, ancillary staff notes, nurses and tech notes, labs and vitals.  I certify that this patients inpatient psychiatric hospital services furnished since the previous certification were, and continue to be, required for treatment that could reasonably be expected to improve the patient's condition, or for diagnostic study, and that the patient continues to need, on a daily basis, active treatment furnished directly by or requiring the supervision of inpatient psychiatric facility personnel. In addition, the hospital records show that services furnished were intensive treatment services, admission or related services, or equivalent services.

## 2022-02-08 NOTE — Progress Notes (Signed)
Spiritual Care Assessment/Progress Note  [facility]    Name: [name] MRN: [id number]    Age: 36 y.o.     Sex: male   Language: English     Date: 02/08/2022            Total Time Calculated: 10 min              Spiritual Assessment begun in [facility]  Service Provided For:: Patient  Referral/Consult From:: Rounding  Encounter Overview/Reason : Spiritual/Emotional Needs    Spiritual beliefs:      []  Involved in a faith tradition/spiritual practice:      []  Supported by a faith community:      []  Claims no spiritual orientation:      []  Seeking spiritual identity:           [x]  Adheres to an individual form of spirituality: Pagan     []  Not able to assess:                Identified resources for coping and support system:   Support System: Unknown       []  Prayer                  []  Devotional reading               []  Music                  []  Guided Imagery     []  Pet visits                                        []  Other: (COMMENT)     Specific area/focus of visit   Encounter:    Crisis:    Spiritual/Emotional needs:    Ritual, Rites and Sacraments:    Grief, Loss, and Adjustments:    Ethics/Mediation:    Behavioral Health: Type : Initial Encounter  Palliative Care:    Advance Care Planning:      Plan/Referrals: Developed Care Plan (see consult note)    Narrative:     Initial visit with [name] to introduce spiritual health care. Pt was resting and not feeling well. He  asked for a visit later. Pt also stated that he is not , but is pagan. He agreed to changing the spiritual designation on his medical records to reflect pagan.    Will revisit later in this admission to assess spiritual/emotional needs.      For additional spiritual care, please contact the chaplain on-call at [phone]).    Rev. , MDiv, MS, [facility]  Staff Chaplain

## 2022-02-08 NOTE — Behavioral Health Treatment Team (Signed)
PRN Medication Documentation    Specific patient behavior that led to need for PRN medication: c/o anxiety  Staff interventions attempted prior to PRN being given: coping skills  PRN medication given: atarax  Patient response/effectiveness of PRN medication: tl aware  1930 Pt states"im nervous regarding  upcoming ect tx.  Note pt asking questions regarding discharge process.  Discharge policy explained to patient  Pt appears fully aware of procedure related to ama.  Tl aware.  2006- pt states"I decided to stay at the hospital"  Requesting something for anxiety.  Tl aware of above

## 2022-02-08 NOTE — Plan of Care (Signed)
Problem: Depression  Goal: Will be euthymic at discharge  Description: INTERVENTIONS:  1. Administer medication as ordered  2. Provide emotional support via 1:1 interaction with staff  3. Encourage involvement in milieu/groups/activities  4. Monitor for social isolation  02/08/2022 1703 by Landry Dyke, RN  Outcome: Progressing

## 2022-02-08 NOTE — Plan of Care (Signed)
Problem: Depression  Goal: Will be euthymic at discharge  Description: INTERVENTIONS:  1. Administer medication as ordered  2. Provide emotional support via 1:1 interaction with staff  3. Encourage involvement in milieu/groups/activities  4. Monitor for social isolation  02/08/2022 1308 by Wadie Lessen, RN  Outcome: Progressing  Note: Pt participated in treatment team. Out on the unit for small periods of time. Ate meals during the shift. Continues to feel depressed. Denies any thoughts of self harm currently. Discussed plan to transfer pt to [facility] for ECT on Wednesday during treatment team.  02/08/2022 1307 by Wadie Lessen, RN  Outcome: Progressing

## 2022-02-09 ENCOUNTER — Inpatient Hospital Stay
Admit: 2022-02-09 | Discharge: 2022-02-10 | Disposition: A | Discharge status: Home / Self Care | Payer: PRIVATE HEALTH INSURANCE | Source: Other Acute Inpatient Hospital | Attending: Psychiatry | Admitting: Psychiatry

## 2022-02-09 DIAGNOSIS — F329 Major depressive disorder, single episode, unspecified: Secondary | ICD-10-CM

## 2022-02-09 LAB — HEMOGLOBIN A1C
Estimated Avg Glucose: 97 mg/dL
Hemoglobin A1C: 5 % (ref 4.0–5.6)

## 2022-02-09 LAB — LIPID PANEL
Chol/HDL Ratio: 6.6 — ABNORMAL HIGH (ref 0.0–5.0)
Cholesterol, Total: 232 MG/DL — ABNORMAL HIGH (ref <200)
HDL: 35 MG/DL
LDL Calculated: 140 MG/DL — ABNORMAL HIGH (ref 0–100)
Triglycerides: 285 MG/DL — ABNORMAL HIGH (ref <150)
VLDL Cholesterol Calculated: 57 MG/DL

## 2022-02-09 MED ORDER — HALOPERIDOL 5 MG PO TABS
5 MG | ORAL | Status: AC | PRN
Start: 2022-02-09 — End: 2022-02-10

## 2022-02-09 MED ORDER — ARIPIPRAZOLE 15 MG PO TABS
15 MG | ORAL_TABLET | Freq: Every day | ORAL | 0 refills | Status: AC
Start: 2022-02-09 — End: 2022-03-12

## 2022-02-09 MED ORDER — NICOTINE POLACRILEX 2 MG MT LOZG
2 MG | OROMUCOSAL | Status: AC | PRN
Start: 2022-02-09 — End: 2022-02-10
  Administered 2022-02-10 (×5): 2 mg via ORAL

## 2022-02-09 MED ORDER — POLYETHYLENE GLYCOL 3350 17 G PO PACK
17 g | Freq: Every day | ORAL | Status: AC | PRN
Start: 2022-02-09 — End: 2022-02-10

## 2022-02-09 MED ORDER — TRAZODONE HCL 50 MG PO TABS
50 MG | Freq: Every evening | ORAL | Status: AC | PRN
Start: 2022-02-09 — End: 2022-02-10

## 2022-02-09 MED ORDER — DOXEPIN HCL 25 MG PO CAPS
25 MG | ORAL_CAPSULE | Freq: Every evening | ORAL | 0 refills | Status: DC
Start: 2022-02-09 — End: 2022-02-10

## 2022-02-09 MED ORDER — HALOPERIDOL LACTATE 5 MG/ML IJ SOLN
5 MG/ML | INTRAMUSCULAR | Status: AC | PRN
Start: 2022-02-09 — End: 2022-02-10

## 2022-02-09 MED ORDER — DIPHENHYDRAMINE HCL 50 MG/ML IJ SOLN
50 MG/ML | INTRAMUSCULAR | Status: AC | PRN
Start: 2022-02-09 — End: 2022-02-10

## 2022-02-09 MED ORDER — ACETAMINOPHEN 325 MG PO TABS
325 | ORAL | Status: DC | PRN
Start: 2022-02-09 — End: 2022-02-10

## 2022-02-09 MED ORDER — ALUM & MAG HYDROXIDE-SIMETH 200-200-20 MG/5ML PO SUSP
200-200-20 MG/5ML | Freq: Four times a day (QID) | ORAL | Status: AC | PRN
Start: 2022-02-09 — End: 2022-02-10

## 2022-02-09 MED ORDER — NICOTINE POLACRILEX 4 MG MT LOZG
4 MG | OROMUCOSAL | 0 refills | Status: DC | PRN
Start: 2022-02-09 — End: 2022-02-10

## 2022-02-09 MED ORDER — HYDROXYZINE HCL 25 MG PO TABS
25 MG | Freq: Three times a day (TID) | ORAL | Status: AC | PRN
Start: 2022-02-09 — End: 2022-02-10

## 2022-02-09 MED FILL — LEVETIRACETAM 500 MG PO TABS: 500 MG | ORAL | Qty: 2

## 2022-02-09 MED FILL — NICOTINE POLACRILEX 4 MG MT LOZG: 4 MG | OROMUCOSAL | Qty: 1

## 2022-02-09 MED FILL — ARIPIPRAZOLE 5 MG PO TABS: 5 MG | ORAL | Qty: 1

## 2022-02-09 MED FILL — HYDROXYZINE HCL 50 MG PO TABS: 50 MG | ORAL | Qty: 2

## 2022-02-09 MED FILL — LAMOTRIGINE 100 MG PO TABS: 100 MG | ORAL | Qty: 2

## 2022-02-09 NOTE — Behavioral Health Treatment Team (Signed)
Pt escorted to [facility] transport by RN in possession of discharge paperwork and all belongings. Report given to [facility]. All test results forwarded to next level of care by CM.

## 2022-02-09 NOTE — Behavioral Health Treatment Team (Signed)
Plan to transfer to [facility] for ECT - [facility] to pickup at 3:15 going to bed 320-02 at [facility] accepted by Jason Coop, NP
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
## 2022-02-09 NOTE — Plan of Care (Signed)
Patient received resting in bed with eyes closed. NAD and no complaints noted. Safety measures in place. Will continue to monitor with q15min rounds.   Problem: Safety - Adult  Goal: Free from fall injury  Outcome: Progressing

## 2022-02-09 NOTE — Discharge Summary (Signed)
DISCHARGE SUMMARY    Some parts of the discharge summary are from the initial Psychiatric interview that was done on admission by the admitting psychiatrist.      Date of Admission: 02/05/2022    Date of Discharge:02/09/2022     TYPE OF DISCHARGE:   REGULAR -  YES    ADMISSION EVALUATION:  CHIEF COMPLAINT:  "Dealt with depression all my life."     HISTORY OF PRESENTING COMPLAINT:  [name] is a 36 y.o. White (non-Hispanic) male who is currently admitted to the general side of 7th floor behavioral health Unit at [facility].      Patient says that he usually keep his emotions to himself and doesn't divulge how terrible he feels to others. However at this time he feels so low that he can't function even at his work. He is experiencing passive death wish and is afraid to end his life because of how low he feels. He's had several past suicide attempts, with one being serious enough to the extent that he was admitted to the ICU 3 years after an overdose. He describes experiencing anhedonia and low frustration tolerance. This hasn't affected his sleep or appetite, but it definitely impacted his function.      No psychosis. No mania.  No fire-arms at home     PAST PSYCHIATRIC HISTORY   Several past inpatient psychiatric admissions, last being at [facility] in April.  1 suicide attempts, with overdose last being 3 years. Was in ICU.     Therapy since 2000  No psychiastrist,  Last time seen last year.     Tried prozac 40mg  po for years  Zoloft unsure dose or length  Effexor unsure of dose or length  Tried cymbalta     Tried risperdal years ago as a teenager  No abilify,   Tired zyprexa  Can't tried seroquel, says his mother told him that it made him 'mean.'     ECT at [facility], which he felt it helped (2014-2016). He later recalled that it may have been [facility], not [facility].     SUBSTANCE USE HISTORY:  Tobacco: 1ppd  Cannabis: cannabis since 36yo  Alcohol: very rare.  IVD: none  No Cocaine/Heroin/amphetamines/LSD/PCP/Ketamine      PAST MEDICAL HISTORY:     Please see H&P for details.      Past Medical History   History reviewed. No pertinent past medical history.     Home Medications   Prior to Admission medications    Not on File               Lab Results   Component Value Date     WBC 8.8 02/05/2022     HGB 14.5 02/05/2022     HCT 43.3 02/05/2022     MCV 88.5 02/05/2022     PLT 251 02/05/2022            Lab Results   Component Value Date     NA 140 02/05/2022     K 4.0 02/05/2022     CL 108 02/05/2022     CO2 28 02/05/2022     BUN 13 02/05/2022     CREATININE 0.83 02/05/2022     GLUCOSE 100 02/05/2022     CALCIUM 9.1 02/05/2022     PROT 7.5 02/05/2022     LABALBU 4.0 02/05/2022     BILITOT 0.4 02/05/2022     ALKPHOS 90 02/05/2022     AST 16 02/05/2022  ALT 38 02/05/2022     LABGLOM >60 02/05/2022     GLOB 3.5 02/05/2022         No results found for: VALAC, VALP, CARB2  No results found for: LITHM  RADIOLOGY REPORTS:(reviewed/updated 02/06/2022)     FAMILY HISTORY:  Paternal uncle completed suicide.  MDD runs in family.  No Substance use in the family     PSYCHOSOCIAL HISTORY:  Lives alone.  Masters degree in business.  Agricultural consultant for [employer].  No children.  12 siblings  No firearms.  Moved in march.     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 He is engaged in the evaluation and maintains good eye contact throughout.  Speech: Spontaneous, has NL rate, volume and prosody.  Thought Process is Logical, linear, and goal directed.  Mood is reported as "so depressed"  Affect is congruent and dysthymic  Passive death wish, but no suicidal intents or plans.   No Homicidal thoughts, intents or plans. Denies access to fire-arms  Denies experiencing hallucinations of any type.  Perception is negative for paranoia or delusional thinking  Attention/Concentration are both intact  Recent/Remote memories are intact per answers to my evaluation questions.  Insight is poor. Judgment is poor  Cognition:  Intact grossly.      ASSESSMENT:  [name] meets criteria for a diagnosis of   .    MDD, Recurrent, Severe, w/o psychosis  Tobacco use d/o.     PLAN:  Recommend inpatient psychiatric admission to mitigate the risk of further decompensation.  Will obtain pertinent labs and collateral information.Encourage patient to participate in programming and group activities.  Medication Regimen As follows:  Discussed past medication trials and patient isn't interested in re-trying SSRIs or SNRIs. We discussed starting abilify since it is the only antipsychotic he hasn't yet tried. We'll start abilify 2mg  po once, then go up to 5mg  po qday starting tomorrow.  is thinking about ECT and would like an opportunity to discuss this with his family.  Patient has been taking keppra 1gram po bid as well as lamictal 200mg  po bid consistently. Will restart.      Course in the Hospital:   Patient was admitted to the inpatient psychiatry unit for acute psychiatric stabilization in regards to symptomatology as described in the HPI above and placed on Q15 minute checks and withdrawal precautions.     Interval History:  02/09/2022  [name] says that he is tolerating the medication changes well. He hasn't reached out to any of his family or loved ones, as he says when he comes in for psychiatric treatment he waits until he is better to reach out. He is eating and sleeping well.   Patient explicitly requests to start ECT and he is understanding of bed availability at the accepting [facility] being an issue.  Passive death wish but no explicit Suicidal intents or plans.      02/08/22  [name] reports feeling "the same". States that he struggled with sleep and Trazodone makes him feel "weird". His mood remains depressed and hopeless. He has remained isolative to his room and feels he has no energy. Passive SI remains but denies any active plan. Remains motivated to get ECT. At the present time the patient [name] remains  compliant with taking medications. Denies any adverse events from taking them and feels they have been slightly beneficial.       02/07/22  [name] reports feeling "  still very depressed". He recalled the author from treatment with ECT at [facility] more than 7 years ago. He seems to remember developing sensitivty to Succinylcholine although this is not clear based on his history. He has a history of having had manic/hypomanic episodes and would meet criteria for Bipolar 1 Disorder. Says he slept fairly well last night and woke up feeling somewhat rested. At the present time the patient [name] remains compliant with taking medications. Denies any adverse events from taking them and feels they have been beneficial.      Medication changes and rationale:  02/09/2022  Will reach out to [facility] providers to further consideration for transfer to ECT.     02/08/2022  He is willing to consider ECT and we discussed the risks and benefits of the procedure. I will start the transfer process to [facility] for this. Discussed the difficulties with doing ECT while on 2 prescribed antiepileptics.   Increased Abilify to 15mg  daily.   Switch to Doxepin for sleep at bedtime. Risk of hypomanic/manic switch remains low at low doses.   Continue the medication regimen as prescribed  Disposition planning to continue.        DISCHARGE DIAGNOSIS:  MDD, Recurrent, Severe, w/o psychosis  Tobacco use d/o.       Medication List        START taking these medications      ARIPiprazole 15 MG tablet  Commonly known as: ABILIFY  Take 1 tablet by mouth daily  Start taking on: February 10, 2022     doxepin 25 MG capsule  Commonly known as: SINEQUAN  Take 1 capsule by mouth nightly     nicotine polacrilex 4 MG lozenge  Commonly known as: COMMIT  Take 1 lozenge by mouth every hour as needed for Smoking cessation            CONTINUE taking these medications      lamoTRIgine 200 MG tablet  Commonly known as: LAMICTAL     levETIRAcetam 1000 MG  tablet  Commonly known as: KEPPRA               Where to Get Your Medications        Information about where to get these medications is not yet available    Ask your nurse or doctor about these medications  ARIPiprazole 15 MG tablet  doxepin 25 MG capsule  nicotine polacrilex 4 MG lozenge       WOUND CARE: none needed.    PROGNOSIS:   Good / Fair based on nature of patient's pathology/ies and treatment compliance issues.  Prognosis is greatly dependent upon patient's ability to  follow up on psychiatric/psychotherapy appointments as well as to comply with psychiatric medications as prescribed.

## 2022-02-09 NOTE — Plan of Care (Incomplete)
Problem: Discharge Planning  Goal: Discharge to home or other facility with appropriate resources  Outcome: Progressing

## 2022-02-09 NOTE — Behavioral Health Treatment Team (Signed)
Behavioral Health Transition Record to Provider    Patient Name: [name]  Date of Birth: 1985/09/30  Medical Record Number: [id number]  Date of Admission: 02/05/2022  Date of Discharge: 02/09/22    Attending Provider: No att. providers found  Discharging Provider: Dr. Virgel Gess   To contact this individual call [phone] and ask the operator to page.  If unavailable, ask to be transferred to [facility] Provider on call.  [facility] Provider will be available on call 24/7 and during holidays.    Primary Care Provider: No primary care provider on file.    Allergies   Allergen Reactions    Iodine     Nitrous Oxide     Shellfish Allergy     Succinylcholine        Reason for Admission: ADMISSION EVALUATION:  CHIEF COMPLAINT:  "Dealt with depression all my life."     HISTORY OF PRESENTING COMPLAINT:  Douglas Smolinsky is a 36 y.o. White (non-Hispanic) male who is currently admitted to the general side of 7th floor behavioral health Unit at [facility].      Patient says that he usually keep his emotions to himself and doesn't divulge how terrible he feels to others. However at this time he feels so low that he can't function even at his work. He is experiencing passive death wish and is afraid to end his life because of how low he feels. He's had several past suicide attempts, with one being serious enough to the extent that he was admitted to the ICU 3 years after an overdose. He describes experiencing anhedonia and low frustration tolerance. This hasn't affected his sleep or appetite, but it definitely impacted his function.      No psychosis. No mania.  No fire-arms at home    Admission Diagnosis: Suicidal ideations [R45.851]  Major depressive disorder without psychotic features [F32.9]  Severe episode of recurrent major depressive disorder, without psychotic features ([place]) [F33.2]    * No surgery found *    Results for orders placed or performed during the hospital encounter of  02/05/22   COVID-19 & Influenza Combo    Specimen: Nasopharyngeal   Result Value Ref Range    SARS-CoV-2, PCR Not detected NOTD      Rapid Influenza A By PCR Not detected NOTD      Rapid Influenza B By PCR Not detected NOTD     Urine Culture Hold Sample    Specimen: Urine   Result Value Ref Range    Specimen HOld        Urine on hold in Microbiology dept for 2 days.  If unpreserved urine is submitted, it cannot be used for addtional testing after 24 hours, recollection will be required.   Lamotrigine Level   Result Value Ref Range    Lamotrigine 2.7 2.0 - 20.0 ug/mL   Levetiracetam Level   Result Value Ref Range    Levetiracetam 2.4 (L) 10.0 - 40.0 ug/mL   CBC with Auto Differential   Result Value Ref Range    WBC 8.8 4.1 - 11.1 K/uL    RBC 4.89 4.10 - 5.70 M/uL    Hemoglobin 14.5 12.1 - 17.0 g/dL    Hematocrit 43.3 36.6 - 50.3 %    MCV 88.5 80.0 - 99.0 FL    MCH 29.7 26.0 - 34.0 PG    MCHC 33.5 30.0 - 36.5 g/dL    RDW 12.8 11.5 - 14.5 %    Platelets 251 150 -  400 K/uL    MPV 10.3 8.9 - 12.9 FL    Nucleated RBCs 0.0 0 PER 100 WBC    nRBC 0.00 0.00 - 0.01 K/uL    Neutrophils % 56 32 - 75 %    Lymphocytes % 32 12 - 49 %    Monocytes % 9 5 - 13 %    Eosinophils % 2 0 - 7 %    Basophils % 1 0 - 1 %    Immature Granulocytes 0 0.0 - 0.5 %    Neutrophils Absolute 5.0 1.8 - 8.0 K/UL    Lymphocytes Absolute 2.8 0.8 - 3.5 K/UL    Monocytes Absolute 0.8 0.0 - 1.0 K/UL    Eosinophils Absolute 0.2 0.0 - 0.4 K/UL    Basophils Absolute 0.0 0.0 - 0.1 K/UL    Absolute Immature Granulocyte 0.0 0.00 - 0.04 K/UL    Differential Type AUTOMATED     Comprehensive Metabolic Panel   Result Value Ref Range    Sodium 140 136 - 145 mmol/L    Potassium 4.0 3.5 - 5.1 mmol/L    Chloride 108 97 - 108 mmol/L    CO2 28 21 - 32 mmol/L    Anion Gap 4 (L) 5 - 15 mmol/L    Glucose 100 65 - 100 mg/dL    BUN 13 6 - 20 MG/DL    Creatinine 0.83 0.70 - 1.30 MG/DL    Bun/Cre Ratio 16 12 - 20      Est, Glom Filt Rate >60 >60 ml/min/1.15m    Calcium 9.1 8.5 -  10.1 MG/DL    Total Bilirubin 0.4 0.2 - 1.0 MG/DL    ALT 38 12 - 78 U/L    AST 16 15 - 37 U/L    Alk Phosphatase 90 45 - 117 U/L    Total Protein 7.5 6.4 - 8.2 g/dL    Albumin 4.0 3.5 - 5.0 g/dL    Globulin 3.5 2.0 - 4.0 g/dL    Albumin/Globulin Ratio 1.1 1.1 - 2.2     Extra Tubes Hold   Result Value Ref Range    Specimen HOld 1blu     Comment:        Add-on orders for these samples will be processed based on acceptable specimen integrity and analyte stability, which may vary by analyte.   Urine Drug Screen   Result Value Ref Range    Amphetamine, Urine Negative NEG      Barbiturates, Urine Negative NEG      Benzodiazepines, Urine Negative NEG      Cocaine, Urine Negative NEG      Methadone, Urine Negative NEG      Opiates, Urine Negative NEG      PCP, Urine Negative NEG      THC, TH-Cannabinol, Urine Positive (A) NEG      Comments: (NOTE)    ETOH   Result Value Ref Range    Ethanol Lvl <10 <10 MG/DL   Hemoglobin A1C   Result Value Ref Range    Hemoglobin A1C 5.0 4.0 - 5.6 %    eAG 97 mg/dL   Lipid Panel   Result Value Ref Range    Cholesterol, Total 232 (H) <200 MG/DL    Triglycerides 285 (H) <150 MG/DL    HDL 35 MG/DL    LDL Calculated 140 (H) 0 - 100 MG/DL    VLDL Cholesterol Calculated 57 MG/DL    Chol/HDL Ratio 6.6 (H) 0.0 - 5.0  Baseline EKG 12 lead   Result Value Ref Range    Ventricular Rate 58 BPM    Atrial Rate 58 BPM    P-R Interval 158 ms    QRS Duration 100 ms    Q-T Interval 422 ms    QTc Calculation (Bazett) 414 ms    P Axis 53 degrees    R Axis -13 degrees    T Axis 8 degrees    Diagnosis       Sinus bradycardia  Otherwise normal ECG  When compared with ECG of 18-Feb-2017 01:14,  Vent. rate has decreased BY  37 BPM  Confirmed by Lawana Chambers, M.D., [place] [phone]) on 02/07/2022 2:16:16 PM         Immunizations administered during this encounter:   Immunization History   Administered Date(s) Administered    Influenza Virus Vaccine 07/28/2010, 03/31/2015    Pneumococcal, PPSV23, PNEUMOVAX 23, (age 2y+),  SC/IM, 0.38m 03/31/2015       Screening for Metabolic Disorders for Patients on Antipsychotic Medications  (Data obtained from the EMR)    Estimated Body Mass Index  Estimated body mass index is 37.41 kg/m as calculated from the following:    Height as of this encounter: 1.778 m (5' 10").    Weight as of this encounter: 118.3 kg (260 lb 11.2 oz).     Vital Signs/Blood Pressure  BP (!) 150/90   Pulse 86   Temp 98 F (36.7 C) (Oral)   Resp 16   Ht 1.778 m (5' 10")   Wt 118.3 kg (260 lb 11.2 oz)   SpO2 96%   PF 97 L/min   BMI 37.41 kg/m     Blood Glucose/Hemoglobin A1c  No results found for: GLU, GLUCPOC    No results found for: HBA1C     Lipid Panel  Lab Results   Component Value Date/Time    CHOL 232 02/08/2022 08:41 PM    HDL 35 02/08/2022 08:41 PM        Discharge Diagnosis: ADMISSION EVALUATION:  CHIEF COMPLAINT:  "Dealt with depression all my life."     HISTORY OF PRESENTING COMPLAINT:  CAbhi Mocciais a 36y.o. White (non-Hispanic) male who is currently [facility]      Patient says that he usually keep his emotions to himself and doesn't divulge how terrible he feels to others. However at this time he feels so low that he can't function even at his work. He is experiencing passive death wish and is afraid to end his life because of how low he feels. He's had several past suicide attempts, with one being serious enough to the extent that he was admitted to the ICU 3 years after an overdose. He describes experiencing anhedonia and low frustration tolerance. This hasn't affected his sleep or appetite, but it definitely impacted his function.      No psychosis. No mania.  No fire-arms at home    [facility]     Discharge Medication List and Instructions:      Medication List        START taking these medications      ARIPiprazole 15 MG tablet  Commonly known as: ABILIFY  Take 1  tablet by mouth daily     doxepin 25 MG capsule  Commonly known as: SINEQUAN  Take 1 capsule by mouth nightly  nicotine polacrilex 4 MG lozenge  Commonly known as: COMMIT  Take 1 lozenge by mouth every hour as needed for Smoking cessation            CONTINUE taking these medications      lamoTRIgine 200 MG tablet  Commonly known as: LAMICTAL     levETIRAcetam 1000 MG tablet  Commonly known as: KEPPRA               Where to Get Your Medications        Information about where to get these medications is not yet available    Ask your nurse or doctor about these medications  ARIPiprazole 15 MG tablet  doxepin 25 MG capsule  nicotine polacrilex 4 MG lozenge               Follow-up Information    None         Advanced Directive:   Does the patient have an appointed surrogate decision maker? No  Does the patient have a Medical Advance Directive? No  Does the patient have a Psychiatric Advance Directive? No  If the patient does not have a surrogate or Medical Advance Directive AND Psychiatric Advance Directive, the patient was offered information on these advance directives Declined     Patient Instructions: Please continue all medications until otherwise directed by physician.      Tobacco Cessation Discharge Plan:   Is the patient a smoker and needs referral for smoking cessation? No  Patient referred to the following for smoking cessation with an appointment? No    Patient was offered medication to assist with smoking cessation at discharge? No  Was education for smoking cessation added to the discharge instructions? Yes    Alcohol/Substance Abuse Discharge Plan:   Does the patient have a history of substance/alcohol abuse and requires a referral for treatment? No  Patient referred to the following for substance/alcohol abuse treatment with an appointment? No  Patient was offered medication to assist with alcohol cessation at discharge? No  Was education for substance/alcohol abuse added to discharge instructions?  No    Patient discharged to [facility]. Discharge information provided to the patient/caregiver either in hard copy or electronically.

## 2022-02-09 NOTE — Interdisciplinary (Addendum)
Behavioral Health Interdisciplinary Rounds     Patient Name: [name]  Age: 36 y.o.  Room/Bed:  731/02  Primary Diagnosis: Major depressive disorder without psychotic features   Admission Status: Voluntary    Readmission within 30 days: No  Power of Attorney in place: No  Patient requires a blocked bed: No            Sleep hours: 6      Participation in Care/Groups:  Yes  Medication Compliant?: Yes  PRNS (last 24 hours): Antianxiety   Restraints (last 24 hours):  No  __________________________________________________  OQ Admission Analysis Survey completed:yes  OQ Admission Analysis Survey score:57  __________________________________________________     Alcohol screening (AUDIT) completed -     If applicable, date SBIRT discussed in treatment team AND documented:    Tobacco -:    Illegal Drugs use:      24 hour chart check complete: Yes    _______________________________________________    Patient goal(s) for today: meet with treatment team  Treatment team focus/goals: Plan to transfer to [facility] for ECT   Progress note:    He reports still being very depressed, denies any issues with his medications, and is looking forward to starting ECT   Spiritual Care Consult: no  Financial concerns/prescription coverage:  Medicaid   Family contact:                        Family requesting physician contact today:    Discharge plan: he will return to his home   Access to weapons :   no                                                           Outpatient provider(s): ECT at [facility]      LOS:  4  Expected LOS: possible discharge to [facility] today     Participating treatment team members: [name], Tina Lesly Pontarelli,SW- Dr. Lennox Grumbles - Sheldon Silvan, RN - Dannielle Karvonen, PharmD.

## 2022-02-09 NOTE — Progress Notes (Signed)
TRANSFER - OUT REPORT:    Verbal report given to LauraRN on [name]  being transferred to [facility] unit for routine progression of patient care   and ECT    Report consisted of patient's Situation, Background, Assessment and   Recommendations(SBAR).     Information from the following report(s) Nurse Handoff Report was reviewed with the receiving nurse.           Lines:       Opportunity for questions and clarification was provided.      Patient transported with: belongings

## 2022-02-09 NOTE — Discharge Instructions (Signed)
DISCHARGE SUMMARY from Nurse    PATIENT INSTRUCTIONS:      What to do at Home:  Recommended activity: activity as tolerated,     If you experience any of the following symptoms thoughts of not feeling safe, suicidal thinking or increase in anxiety - inform staff immediately    *  Please give a list of your current medications to your Primary Care Provider.    *  Please update this list whenever your medications are discontinued, doses are      changed, or new medications (including over-the-counter products) are added.    *  Please carry medication information at all times in case of emergency situations.    These are general instructions for a healthy lifestyle:    No smoking/ No tobacco products/ Avoid exposure to second hand smoke  Surgeon General's Warning:  Quitting smoking now greatly reduces serious risk to your health.    Obesity, smoking, and sedentary lifestyle greatly increases your risk for illness    A healthy diet, regular physical exercise & weight monitoring are important for maintaining a healthy lifestyle    You may be retaining fluid if you have a history of heart failure or if you experience any of the following symptoms:  Weight gain of 3 pounds or more overnight or 5 pounds in a week, increased swelling in our hands or feet or shortness of breath while lying flat in bed.  Please call your doctor as soon as you notice any of these symptoms; do not wait until your next office visit.        The discharge information has been reviewed with the patient.  The patient verbalized understanding.  Discharge medications reviewed with the patient and appropriate educational materials and side effects teaching were provided.  ___________________________________________________________________________________________________________________________________

## 2022-02-09 NOTE — Progress Notes (Signed)
Laboratory Monitoring for Antipsychotics:    This patient is currently prescribed the following medication(s):   Current Facility-Administered Medications: ARIPiprazole (ABILIFY) tablet 15 mg, 15 mg, Oral, Daily  doxepin (SINEQUAN) capsule 25 mg, 25 mg, Oral, Nightly  polyethylene glycol (GLYCOLAX) packet 17 g, 17 g, Oral, Daily PRN  senna (SENOKOT) tablet 8.6 mg, 1 tablet, Oral, Daily PRN  aluminum & magnesium hydroxide-simethicone (MAALOX) 200-200-20 MG/5ML suspension 30 mL, 30 mL, Oral, Q6H PRN  haloperidol (HALDOL) tablet 5 mg, 5 mg, Oral, Q4H PRN **OR** haloperidol lactate (HALDOL) injection 5 mg, 5 mg, IntraMUSCular, Q4H PRN  diphenhydrAMINE (BENADRYL) injection 50 mg, 50 mg, IntraMUSCular, Q4H PRN  nicotine polacrilex (COMMIT) lozenge 4 mg, 4 mg, Oral, Q1H PRN  levETIRAcetam (KEPPRA) tablet 1,000 mg, 1,000 mg, Oral, BID  hydrOXYzine HCl (ATARAX) tablet 100 mg, 100 mg, Oral, TID PRN  lamoTRIgine (LAMICTAL) tablet 200 mg, 200 mg, Oral, BID    The following labs have been completed for monitoring of antipsychotics and/or mood stabilizers:    Height, Weight, BMI Estimation  Estimated body mass index is 37.41 kg/m as calculated from the following:    Height as of this encounter: 1.778 m (5\' 10" ).    Weight as of this encounter: 118.3 kg (260 lb 11.2 oz).     Vital Signs/Blood Pressure  BP (!) 147/84   Pulse 71   Temp 98.2 F (36.8 C) (Oral)   Resp 16   Ht 1.778 m (5\' 10" )   Wt 118.3 kg (260 lb 11.2 oz)   SpO2 96%   PF 97 L/min   BMI 37.41 kg/m     Renal Function, Hepatic Function and Chemistry  Estimated Creatinine Clearance: 159 mL/min (based on SCr of 0.83 mg/dL).    Lab Results   Component Value Date/Time    NA 140 02/05/2022 02:07 PM    K 4.0 02/05/2022 02:07 PM    CL 108 02/05/2022 02:07 PM    CO2 28 02/05/2022 02:07 PM    BUN 13 02/05/2022 02:07 PM    GLOB 3.5 02/05/2022 02:07 PM    ALT 38 02/05/2022 02:07 PM    AST 16 02/05/2022 02:07 PM       Glucose/Hemoglobin A1c  No results found for: GLU,  GLUCPOC  Lab Results   Component Value Date/Time    LABA1C 5.0 02/08/2022 08:41 PM    EAG 97 02/08/2022 08:41 PM       Hematology  Lab Results   Component Value Date/Time    WBC 8.8 02/05/2022 02:07 PM    RBC 4.89 02/05/2022 02:07 PM    HGB 14.5 02/05/2022 02:07 PM    HCT 43.3 02/05/2022 02:07 PM    MCV 88.5 02/05/2022 02:07 PM    MCH 29.7 02/05/2022 02:07 PM    MCHC 33.5 02/05/2022 02:07 PM    RDW 12.8 02/05/2022 02:07 PM    PLT 251 02/05/2022 02:07 PM       Lipids  Lab Results   Component Value Date/Time    CHOL 232 02/08/2022 08:41 PM    TRIG 285 02/08/2022 08:41 PM    HDL 35 02/08/2022 08:41 PM    LDLCALC 140 02/08/2022 08:41 PM    LABVLDL 57 02/08/2022 08:41 PM    CHOLHDLRATIO 6.6 02/08/2022 08:41 PM       Thyroid Function  No results found for: TSH, TSH2, TSH3, TSHELE, T3RIA, T3UP, FT3, FT4, FT4P, T4, T4P, FT4T, TT7    Assessment/Plan:  Recommended baseline laboratory monitoring has been completed based  on this patient's current medication regimen. Of note, labs were not fasting and therefore lipid panel particularly the triglycerides may be falsely elevated. Recommend following up with PCP upon discharge to closely follow cholesterol. Follow-up metabolic monitoring labs should be completed in 3 months (quarterly for the first year of antipsychotic therapy).     Ramonita Lab, [facility]

## 2022-02-09 NOTE — Plan of Care (Signed)
Problem: Self Harm/Suicidality  Goal: Will have no self-injury during hospital stay  Description: INTERVENTIONS:  1.  Ensure constant observer at bedside with Q15M safety checks  2.  Maintain a safe environment  3.  Secure patient belongings  4.  Ensure family/visitors adhere to safety recommendations  5.  Ensure safety tray has been added to patient's diet order  6.  Every shift and PRN: Re-assess suicidal risk via Frequent Screener    Outcome: Progressing  Flowsheets (Taken 02/07/2022 0830 by Wadie Lessen, RN)  Will have no self-injury during hospital stay: Maintain a safe environment  Note: Denies SI, no self harming behaviors, no plan, no intent and no urge to self harm. Future focused stating he wants ECT     Problem: Depression  Goal: Will be euthymic at discharge  Description: INTERVENTIONS:  1. Administer medication as ordered  2. Provide emotional support via 1:1 interaction with staff  3. Encourage involvement in milieu/groups/activities  4. Monitor for social isolation  Outcome: Progressing  Note: Out on unit passively engaged, reports feeling depressed, insight into his irritability, communicated with Kipp Brood at [facility] for ECT appointment and Access for transfer to [facility]. Awaiting a bed at this time. Pt verbalized understanding.

## 2022-02-09 NOTE — Behavioral Health Treatment Team (Signed)
Chief Complaint:  "I'm ok with the medication, but my depression is still there."    Length of Stay: 4 Days    Interval History:  02/09/2022  [name] says that he is tolerating the medication changes well. He hasn't reached out to any of his family or loved ones, as he says when he comes in for psychiatric treatment he waits until he is better to reach out. He is eating and sleeping well.   Patient explicitly requests to start ECT and he is understanding of bed availability at the accepting [facility] being an issue.  Passive death wish but no explicit Suicidal intents or plans.      02/08/22  [name] reports feeling "the same". States that he struggled with sleep and Trazodone makes him feel "weird". His mood remains depressed and hopeless. He has remained isolative to his room and feels he has no energy. Passive SI remains but denies any active plan. Remains motivated to get ECT. At the present time the patient [name] remains compliant with taking medications. Denies any adverse events from taking them and feels they have been slightly beneficial.       02/07/22  [name] reports feeling "still very depressed". He recalled the author from treatment with ECT at [facility] more than 7 years ago. He seems to remember developing sensitivty to Succinylcholine although this is not clear based on his history. He has a history of having had manic/hypomanic episodes and would meet criteria for Bipolar 1 Disorder. Says he slept fairly well last night and woke up feeling somewhat rested. At the present time the patient [name] remains compliant with taking medications. Denies any adverse events from taking them and feels they have been beneficial.       Past Medical History:  History reviewed. No pertinent past medical history.        Labs:  Lab Results   Component Value Date/Time    WBC 8.8 02/05/2022 02:07 PM    HGB 14.5 02/05/2022 02:07 PM    HCT 43.3 02/05/2022 02:07 PM    PLT 251  02/05/2022 02:07 PM    MCV 88.5 02/05/2022 02:07 PM      Lab Results   Component Value Date/Time    NA 140 02/05/2022 02:07 PM    K 4.0 02/05/2022 02:07 PM    CL 108 02/05/2022 02:07 PM    CO2 28 02/05/2022 02:07 PM    BUN 13 02/05/2022 02:07 PM    GLOB 3.5 02/05/2022 02:07 PM    ALT 38 02/05/2022 02:07 PM          Current Facility-Administered Medications   Medication Dose Route Frequency Provider Last Rate Last Admin    ARIPiprazole (ABILIFY) tablet 15 mg  15 mg Oral Daily Zaffar Sheliah Plane, MD   15 mg at 02/09/22 0851    doxepin (SINEQUAN) capsule 25 mg  25 mg Oral Nightly Zaffar Sheliah Plane, MD   25 mg at 02/08/22 2124    polyethylene glycol (GLYCOLAX) packet 17 g  17 g Oral Daily PRN Pia Mau, APRN - NP        senna (SENOKOT) tablet 8.6 mg  1 tablet Oral Daily PRN Pia Mau, APRN - NP        aluminum & magnesium hydroxide-simethicone (MAALOX) 200-200-20 MG/5ML suspension 30 mL  30 mL Oral Q6H PRN Pia Mau, APRN - NP        haloperidol (HALDOL) tablet 5 mg  5 mg Oral Q4H PRN  Pia Mau, APRN - NP   5 mg at 02/07/22 2118    Or    haloperidol lactate (HALDOL) injection 5 mg  5 mg IntraMUSCular Q4H PRN Pia Mau, APRN - NP        diphenhydrAMINE (BENADRYL) injection 50 mg  50 mg IntraMUSCular Q4H PRN Pia Mau, APRN - NP        nicotine polacrilex (COMMIT) lozenge 4 mg  4 mg Oral Q1H PRN Pia Mau, APRN - NP   4 mg at 02/08/22 2125    levETIRAcetam (KEPPRA) tablet 1,000 mg  1,000 mg Oral BID Violeta Gelinas, APRN - NP   1,000 mg at 02/09/22 0630    hydrOXYzine HCl (ATARAX) tablet 100 mg  100 mg Oral TID PRN Frankey Poot, MD   100 mg at 02/08/22 2008    lamoTRIgine (LAMICTAL) tablet 200 mg  200 mg Oral BID Frankey Poot, MD   200 mg at 02/09/22 0851         Mental Status Exam:  Eye contact: fair  Grooming: fair  Psychomotor activity: WNL  Speech is spontaneous  Mood is "okay"  Affect: Depressed  Passive SI, but no explicit plan or intent  No AVH  Insight and Judgment are both poor    Vitals:    02/08/22 1938   BP: (!)  147/84   Pulse: 71   Resp: 16   Temp: 98.2 F (36.8 C)   SpO2: 96%        Physical Exam:  Body habitus: Body mass index is 37.41 kg/m.  Musculoskeletal system: normal gait  Tremor - neg  Cog wheeling - neg    Assessment and Plan:  [name] meets criteria for a diagnosis of   Bipolar 1 Disorder, MRE MDD, Recurrent, Severe, w/o psychosis  Tobacco use d/o.    02/09/2022  Will reach out to [facility] providers to further consideration for transfer to ECT.    02/08/2022  He is willing to consider ECT and we discussed the risks and benefits of the procedure. I will start the transfer process to [facility] for this. Discussed the difficulties with doing ECT while on 2 prescribed antiepileptics.   Increased Abilify to 15mg  daily.   Switch to Doxepin for sleep at bedtime. Risk of hypomanic/manic switch remains low at low doses.   Continue the medication regimen as prescribed  Disposition planning to continue.   A coordinated, multidisplinary treatment team round was conducted with the patient, nurses, pharmcist, present. Discussions held with case manager, and/or with family members; Complete current electronic health record for patient was reviewed in full including consultant notes, ancillary staff notes, nurses and tech notes, labs and vitals.  I certify that this patients inpatient psychiatric hospital services furnished since the previous certification were, and continue to be, required for treatment that could reasonably be expected to improve the patient's condition, or for diagnostic study, and that the patient continues to need, on a daily basis, active treatment furnished directly by or requiring the supervision of inpatient psychiatric facility personnel. In addition, the hospital records show that services furnished were intensive treatment services, admission or related services, or equivalent services.

## 2022-02-10 MED ORDER — ARIPIPRAZOLE 15 MG PO TABS
15 MG | Freq: Every day | ORAL | Status: DC
Start: 2022-02-10 — End: 2022-02-10
  Administered 2022-02-10: 18:00:00 15 mg via ORAL

## 2022-02-10 MED ORDER — LEVETIRACETAM 500 MG PO TABS
500 MG | Freq: Two times a day (BID) | ORAL | Status: DC
Start: 2022-02-10 — End: 2022-02-10
  Administered 2022-02-10: 18:00:00 1000 mg via ORAL

## 2022-02-10 MED ORDER — LAMOTRIGINE 200 MG PO TABS
200 MG | ORAL_TABLET | Freq: Two times a day (BID) | ORAL | 1 refills | Status: AC
Start: 2022-02-10 — End: ?

## 2022-02-10 MED ORDER — LAMOTRIGINE 200 MG PO TABS
200 MG | ORAL_TABLET | Freq: Two times a day (BID) | ORAL | 1 refills | Status: DC
Start: 2022-02-10 — End: 2022-02-10

## 2022-02-10 MED ORDER — DOXEPIN HCL 25 MG PO CAPS
25 MG | Freq: Every evening | ORAL | Status: DC
Start: 2022-02-10 — End: 2022-02-10

## 2022-02-10 MED ORDER — LEVETIRACETAM 1000 MG PO TABS
1000 MG | ORAL_TABLET | Freq: Two times a day (BID) | ORAL | 1 refills | Status: DC
Start: 2022-02-10 — End: 2022-02-10

## 2022-02-10 MED ORDER — LEVETIRACETAM 1000 MG PO TABS
1000 MG | ORAL_TABLET | Freq: Two times a day (BID) | ORAL | 1 refills | Status: DC
Start: 2022-02-10 — End: 2022-03-25

## 2022-02-10 MED ORDER — DOXEPIN HCL 25 MG PO CAPS
25 MG | ORAL_CAPSULE | Freq: Every evening | ORAL | 1 refills | Status: AC
Start: 2022-02-10 — End: 2022-04-11

## 2022-02-10 MED ORDER — LAMOTRIGINE 25 MG PO TABS
25 MG | Freq: Two times a day (BID) | ORAL | Status: DC
Start: 2022-02-10 — End: 2022-02-10
  Administered 2022-02-10: 18:00:00 150 mg via ORAL

## 2022-02-10 MED ORDER — ARIPIPRAZOLE 15 MG PO TABS
15 MG | ORAL_TABLET | Freq: Every day | ORAL | 1 refills | Status: DC
Start: 2022-02-10 — End: 2022-02-10

## 2022-02-10 MED ORDER — DOXEPIN HCL 25 MG PO CAPS
25 MG | ORAL_CAPSULE | Freq: Every evening | ORAL | 1 refills | Status: DC
Start: 2022-02-10 — End: 2022-02-10

## 2022-02-10 MED ORDER — ARIPIPRAZOLE 15 MG PO TABS
15 MG | ORAL_TABLET | Freq: Every day | ORAL | 1 refills | Status: AC
Start: 2022-02-10 — End: 2022-04-11

## 2022-02-10 MED FILL — LEVETIRACETAM 500 MG PO TABS: 500 MG | ORAL | Qty: 2

## 2022-02-10 MED FILL — LAMOTRIGINE 25 MG PO TABS: 25 MG | ORAL | Qty: 2

## 2022-02-10 MED FILL — NICOTINE POLACRILEX 2 MG MT LOZG: 2 MG | OROMUCOSAL | Qty: 1

## 2022-02-10 MED FILL — ARIPIPRAZOLE 15 MG PO TABS: 15 MG | ORAL | Qty: 1

## 2022-02-10 NOTE — Progress Notes (Signed)
MUSIC THERAPY GROUP PROGRESS NOTE        02/10/2022    The patient [name] a 36 y.o. male participated in Music Therapy group on the General Behavioral Health unit.    Group time: 10:10-10:55    Personal goal for participation: Patient (pt) will actively participate in at least one-third of the group session time.    Goal orientation:   personal       Group therapy participation: Passive    Therapeutic interventions: Identity Soundtrack; self-expression; Art; Mindfulness    Observation of participation: The patient (pt) participated passively during the entirety of the music therapy group session. Though pt engaged in following instructions and mindfulness experience provided by this music therapist (MT), pt was only present during receptive music experiences (music listening during live music experiences offered by this MT). Once this MT introduced the activity, pt exited, but re-entered the space as this MT sang and played a song that offered themes of encouragement and validation. As this song came to a close, pt expressed enjoyment in the music and completed the post-session survey before exiting the space.     Sherre Lain, MT-BC (Music Therapist, Board Certified)  Spiritual Care Department  Referral-Based Services

## 2022-02-10 NOTE — Behavioral Health Treatment Team (Signed)
Pt A/O x 4, ambulatory, calm and cooperative. Pt leaving hospital AMA due to not being able to receive ECT treatments while here. AVS education and medications reviewed with patient. Pt verbalized understanding. Denies further questions.

## 2022-02-10 NOTE — Behavioral Health Treatment Team (Signed)
Discharge update:      This Clinical research associate spoke with patient in regards to concerns about ECT and the possibility of patient not being able to receive due to medical concerns. Patient was able to accept feedback well. SW provided patient with handouts on ways to manage his main mental health symptoms (anhedonia). Patient requested to leave due to patient most likely not being able to get ECT. Patient will discharge today AMA, but SW will provide follow up referral and send patient with scripts. SW will [facility] [address] [facility].        Mikki Santee, supervisee in social work

## 2022-02-10 NOTE — Behavioral Health Treatment Team (Signed)
Pt A/O x 4, pleasant and cooperative. Pt denies SI/HI/AVH. Denied anxiety and depression. Although became anxious throughout day when told that he may not receive ECT treatments while here in the hospital. Pt given PRN Nicotine Lozenges throughout day. Pt states that he'd like to leave if not able to receive ECT treatments. Social work notified. Pt attending groups throughout day. Complaint with medications and meals.  Denies further needs at this time.

## 2022-02-10 NOTE — Progress Notes (Signed)
Laboratory Monitoring for Mood Stabilizers and Antipsychotics    Recommended baseline monitoring has not been completed based on this patient's current medication regimen.The following labs have been ordered to complete baseline monitoring: TSH     This patient is currently prescribed the following medication(s):   Current Facility-Administered Medications: ARIPiprazole (ABILIFY) tablet 15 mg, 15 mg, Oral, Daily  doxepin (SINEQUAN) capsule 25 mg, 25 mg, Oral, Nightly  lamoTRIgine (LAMICTAL) tablet 150 mg, 150 mg, Oral, BID  levETIRAcetam (KEPPRA) tablet 1,000 mg, 1,000 mg, Oral, BID  acetaminophen (TYLENOL) tablet 650 mg, 650 mg, Oral, Q4H PRN  polyethylene glycol (GLYCOLAX) packet 17 g, 17 g, Oral, Daily PRN  aluminum & magnesium hydroxide-simethicone (MAALOX) 200-200-20 MG/5ML suspension 30 mL, 30 mL, Oral, Q6H PRN  hydrOXYzine HCl (ATARAX) tablet 50 mg, 50 mg, Oral, TID PRN  haloperidol (HALDOL) tablet 5 mg, 5 mg, Oral, Q4H PRN **OR** haloperidol lactate (HALDOL) injection 5 mg, 5 mg, IntraMUSCular, Q4H PRN  diphenhydrAMINE (BENADRYL) injection 50 mg, 50 mg, IntraMUSCular, Q4H PRN  traZODone (DESYREL) tablet 50 mg, 50 mg, Oral, Nightly PRN  nicotine polacrilex (COMMIT) lozenge 2 mg, 2 mg, Oral, Q1H PRN    The following labs have been completed for monitoring of antipsychotics and/or mood stabilizers:    Height, Weight, BMI Estimation  Estimated body mass index is 37.18 kg/m as calculated from the following:    Height as of this encounter: 1.778 m (5\' 10" ).    Weight as of this encounter: 117.5 kg (259 lb 1.6 oz).     Vital Signs/Blood Pressure  BP 134/89   Pulse 73   Temp 98.2 F (36.8 C) (Oral)   Resp 18   Ht 1.778 m (5\' 10" )   Wt 117.5 kg (259 lb 1.6 oz)   SpO2 96%   BMI 37.18 kg/m     Renal Function, Hepatic Function and Chemistry  Estimated Creatinine Clearance: 158 mL/min (based on SCr of 0.83 mg/dL).    Lab Results   Component Value Date/Time    NA 140 02/05/2022 02:07 PM    K 4.0 02/05/2022 02:07  PM    CL 108 02/05/2022 02:07 PM    CO2 28 02/05/2022 02:07 PM    ANIONGAP 4 02/05/2022 02:07 PM    GLUCOSE 100 02/05/2022 02:07 PM    BUN 13 02/05/2022 02:07 PM    CREATININE 0.83 02/05/2022 02:07 PM    BUNCRER 16 02/05/2022 02:07 PM    BILITOT 0.4 02/05/2022 02:07 PM    PROT 7.5 02/05/2022 02:07 PM    LABALBU 4.0 02/05/2022 02:07 PM    GLOB 3.5 02/05/2022 02:07 PM    ALT 38 02/05/2022 02:07 PM    AST 16 02/05/2022 02:07 PM    ALKPHOS 90 02/05/2022 02:07 PM       Glucose/Hemoglobin A1c  No results found for: GLU, GLUCPOC  Lab Results   Component Value Date/Time    LABA1C 5.0 02/08/2022 08:41 PM    EAG 97 02/08/2022 08:41 PM       Hematology  Lab Results   Component Value Date/Time    WBC 8.8 02/05/2022 02:07 PM    RBC 4.89 02/05/2022 02:07 PM    HGB 14.5 02/05/2022 02:07 PM    HCT 43.3 02/05/2022 02:07 PM    MCV 88.5 02/05/2022 02:07 PM    MCH 29.7 02/05/2022 02:07 PM    MCHC 33.5 02/05/2022 02:07 PM    RDW 12.8 02/05/2022 02:07 PM    PLT 251 02/05/2022 02:07 PM  Lipids  Lab Results   Component Value Date/Time    CHOL 232 02/08/2022 08:41 PM    TRIG 285 02/08/2022 08:41 PM    HDL 35 02/08/2022 08:41 PM    LDLCALC 140 02/08/2022 08:41 PM    LABVLDL 57 02/08/2022 08:41 PM    CHOLHDLRATIO 6.6 02/08/2022 08:41 PM     Sandi Mariscal, PharmD, BCPS, BCPP  Clinical Pharmacy Specialist, Behavioral Health  Desk: [phone])  Pharmacy: [phone])

## 2022-02-10 NOTE — Behavioral Health Treatment Team (Signed)
Behavioral Health Transition Record to Provider    Patient Name: [name]  Date of Birth: 04-Apr-1986  Medical Record Number: [id number]  Date of Admission: 02/09/2022  Date of Discharge: 02/10/2022    Attending Provider: Hiram Gash, MD  Discharging Provider: Myrle Sheng, MD  To contact this individual call [phone] and ask the operator to page.  If unavailable, ask to be transferred to [facility] Provider on call.  A Behavioral Health Provider will be available on call 24/7 and during holidays.    Primary Care Provider: No primary care provider on file.    Allergies   Allergen Reactions    Iodine     Nitrous Oxide     Shellfish Allergy     Succinylcholine        Reason for Admission: ECT    Admission Diagnosis: Depression [F32.A]    * No surgery found *    No results found for this visit on 02/09/22.    Immunizations administered during this encounter:   Immunization History   Administered Date(s) Administered    Influenza Virus Vaccine 07/28/2010, 03/31/2015    Pneumococcal, PPSV23, PNEUMOVAX 23, (age 2y+), SC/IM, 0.60mL 03/31/2015       Screening for Metabolic Disorders for Patients on Antipsychotic Medications  (Data obtained from the EMR)    Estimated Body Mass Index  Estimated body mass index is 37.18 kg/m as calculated from the following:    Height as of this encounter: 1.778 m (5\' 10" ).    Weight as of this encounter: 117.5 kg (259 lb 1.6 oz).     Vital Signs/Blood Pressure  BP 134/89   Pulse 73   Temp 98.2 F (36.8 C) (Oral)   Resp 18   Ht 1.778 m (5\' 10" )   Wt 117.5 kg (259 lb 1.6 oz)   SpO2 96%   BMI 37.18 kg/m     Blood Glucose/Hemoglobin A1c  No results found for: GLU, GLUCPOC    No results found for: HBA1C     Lipid Panel  Lab Results   Component Value Date/Time    CHOL 232 02/08/2022 08:41 PM    HDL 35 02/08/2022 08:41 PM        Discharge Diagnosis: Please refer to physician's summary     Discharge Plan: home    Discharge Medication List and Instructions:       Medication List        CONTINUE taking these medications      ARIPiprazole 15 MG tablet  Commonly known as: ABILIFY  Take 1 tablet by mouth daily     doxepin 25 MG capsule  Commonly known as: SINEQUAN  Take 1 capsule by mouth nightly     lamoTRIgine 200 MG tablet  Commonly known as: LAMICTAL  Take 1 tablet by mouth 2 times daily     levETIRAcetam 1000 MG tablet  Commonly known as: KEPPRA  Take 1 tablet by mouth 2 times daily            STOP taking these medications      nicotine polacrilex 4 MG lozenge  Commonly known as: COMMIT               Where to Get Your Medications        [name] [place] [place] - [address] 02/10/2022 [phone] Demetrius Charity [phone]  [address] [place], [place] [address] [place]      Phone: [phone]  ARIPiprazole 15 MG tablet  doxepin 25 MG capsule  lamoTRIgine 200 MG tablet  levETIRAcetam 1000 MG tablet           To obtain results of studies pending at discharge, please contact medical records [phone] option 4.    Follow-up Information       Follow up With Specialties Details Why Contact Info    [facility]  Go in 1 day(s) Walk-in Hours  Monday - Wednesday 7:30am - 3:00pm  Thursday 7:30am - 5:30pm   Friday 7:30am - 11:00am  Walk-in Locations:  [address] [place], [place] [address]  [phone] [address] [place], [place] [address]  Please bring:   A picture ID   Social Security Number   Insurance card (and applicable co-payment) or proof of income   Legal guardian/custody documentation (if applicable)   List of all medications, dosages and prescribing doctor   List of physical health conditions   Name, address, and phone number for an emergency contact person   Name, address, and phone number for Primary Care Physician  For further information, please call:  [phone] Address: [address], [place], [place] [address]    Address: [address] [place] [address]            Advanced Directive:   Does the patient have an appointed  surrogate decision maker? No  Does the patient have a Medical Advance Directive? No  Does the patient have a Psychiatric Advance Directive? No  If the patient does not have a surrogate or Medical Advance Directive AND Psychiatric Advance Directive, the patient was offered information on these advance directives No    Patient Instructions: Please continue all medications until otherwise directed by physician.      Tobacco Cessation Discharge Plan:   Is the patient a smoker and needs referral for smoking cessation? No  Patient referred to the following for smoking cessation with an appointment? No    Patient was offered medication to assist with smoking cessation at discharge? No  Was education for smoking cessation added to the discharge instructions? No    Alcohol/Substance Abuse Discharge Plan:   Does the patient have a history of substance/alcohol abuse and requires a referral for treatment?   Patient referred to the following for substance/alcohol abuse treatment with an appointment? No  Patient was offered medication to assist with alcohol cessation at discharge? No  Was education for substance/alcohol abuse added to discharge instructions? No    Patient discharged to [facility]. Discharge information provided to the patient/caregiver either in hard copy or electronically.

## 2022-02-10 NOTE — Progress Notes (Signed)
[facility] Transfer Pharmacy Medication Reconciliation    Information obtained from: [facility] records  RxQuery data available1: N/A    Comments/recommendations:  Medication list below represents medications patient was receiving at [facility] prior to transfer to [facility] for ECT.      1RxQuery pharmacy benefit data reflects medications filled and processed through the patient's insurance, however                this data does NOT capture whether the medication was picked up or is currently being taken by the patient.       Patient allergies:   Allergies as of 02/09/2022 - Fully Reviewed 02/09/2022   Allergen Reaction Noted    Iodine  01/15/2022    Nitrous oxide  01/15/2022    Shellfish allergy  01/15/2022    Succinylcholine  01/15/2022       Prior to Admission Medications   Prescriptions Last Dose Informant Patient Reported? Taking?   ARIPiprazole (ABILIFY) 15 MG tablet 02/09/2022 at 0851 Transfer Papers/EMS No No   Sig: Take 1 tablet by mouth daily   doxepin (SINEQUAN) 25 MG capsule 02/08/2022 at 2124 Transfer Papers/EMS No No   Sig: Take 1 capsule by mouth nightly   lamoTRIgine (LAMICTAL) 200 MG tablet 02/09/2022 at 0851 Self, Transfer Papers/EMS Yes No   Sig: Take 1 tablet by mouth 2 times daily   levETIRAcetam (KEPPRA) 1000 MG tablet 02/09/2022 at 0851 Self, Transfer Papers/EMS Yes No   Sig: Take 1 tablet by mouth 2 times daily   nicotine polacrilex (COMMIT) 4 MG lozenge  Transfer Papers/EMS No No   Sig: Take 1 lozenge by mouth every hour as needed for Smoking cessation      Facility-Administered Medications: None          Thank you,  Sandi Mariscal, PharmD, BCPS, [facility]  Clinical Pharmacy Specialist, Behavioral Health  Desk: [phone])  Pharmacy: [phone])

## 2022-02-10 NOTE — Behavioral Health Treatment Team (Signed)
PSYCHOSOCIAL ASSESSMENT  :Patient identifying info:   [name] is a 36 y.o., male admitted 02/09/2022  4:33 PM     Presenting problem and precipitating factors: Pt presents to ED with SI for 24 hours with plan of overdose. Pt reports he has had previous suicide attempts via overdosing but could not remember years or details. Pt denies HI/AVH. Pt reports he moved to [place] 2 months ago from [place]. Pt reports he has been diagnosed with MDD and that he was taking Prozac but he stopped taking it 2 days ago. Pt has had 7 ED visits in the past 3 months.    Pt does have hx of seizures, last one was last week reportedly.    Mental status assessment: Pt presents ao x 4. Pt denies SI/HI/AVH anxiety and depression at this time. Pt eye contact poor. Pt speech is soft. Pt affect flat. Pt reports appetite increase. Pt endorses hypersomnia. Pt insight and judgment poor    Strengths/Recreation/Coping Skills:Pt has housing. Pt is insured.    [employer] [address]    Plan: [facility] MEDICAID COMMUNITY HEALTH PLAN [place] Group: [id number] Member: [id number]   Effective from: 01/24/2022 Subscriber: Montini,Deangelo Subscriber ID: [id number]   Guarantor: Rosenkranz,Wilson       Collateral information: None    Current psychiatric /substance abuse providers and contact info: None    Previous psychiatric/substance abuse providers and response to treatment: Pt reports he was prescribed Prozac but stopped taking it 2 days prior to admission.    Family history of mental illness or substance abuse: Unable to assess    Substance abuse history:    Social History     Tobacco Use    Smoking status: Every Day     Packs/day: 1.00     Types: Cigarettes    Smokeless tobacco: Not on file   Substance Use Topics    Alcohol use: Never       History of biomedical complications associated with substance abuse: Pt reports he smokes 4 blunts a day.    Patient's current acceptance of treatment or motivation for change: moderate, pt is  voluntary    Family constellation: Pt single with no children.    Is significant other involved? No    Describe support system: Pt reports no one.    Describe living arrangements and home environment: Pt lives on his own.    GUARDIAN/POA: No    Guardian Name: n/a    Guardian Contact: n/a    Health issues:     Trauma history: Pt has hx of suicide attempts.    Legal issues: No    History of military service: No    Financial status: Pt is employed at [employer] works 50+ hours/week.    Religious/cultural factors: None voiced    Education/work history: Unable to assess    Have you been licensed as a health care professional (current or expired): No    Describe coping skills:Limited and ineffective    Bettye Boeck  02/10/2022

## 2022-02-10 NOTE — Discharge Summary (Signed)
INITIAL PSYCHIATRIC EVALUATION & DISCHARGE SUMMARY           IDENTIFICATION:    Patient Name  [name]   Date of Birth September 20, 1985   CSN [id number]   Medical Record Number  [id number]      Age  36 y.o.   PCP No primary care provider on file.   Admit date:  02/09/2022    Room Number  664/40  @ [facility] [place]   Date of Service  02/11/2022            HISTORY         TYPE OF DISCHARGE: AMA        CONDITION AT DISCHARGE: Fair       REASON FOR HOSPITALIZATION:  CC: "suicidal ideation". Pt admitted under a voluntary basis for severe depression with suicidal ideations proving to be an imminent danger to self.    HISTORY OF PRESENT ILLNESS:    The patient, [name], is a 36 y.o.  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  Additional symptomatology include mood lability.  The above symptoms have been present for 2+ weeks. These symptoms are of moderate to high severity. These symptoms are constant in nature.  The patient's condition has been precipitated by psychosocial stressors.    The patient was transferred from a sister hospital to potentially start ECT. Per previous records and discussion with procedurist, the patient was to first get clearance from anesthesia; there was also questions about his diagnosis and candidacy for the treatment. When this was explained to the patient, he requested to discharge home rather than wait to ascertain the appropriateness of the procedure for his circumstances. Patient given outpatient information and voiced understanding.    Patient with request for discharge today. There are no grounds to seek a TDO.    At time of discharge, [name] is without significant problems of depression, psychosis, or mania. Patient free of  suicidal and homicidal ideations (appears to be a chronic, elevated risk of suicide or homicide) and reports many positive predictive factors in terms of not attempting suicide or homicide. Overall presentation at time of discharge is most consistent with the diagnosis of MDD.    Patient has maximized benefit to be derived from acute inpatient psychiatric treatment.  All members of the treatment team concur with each other in regards to plans for discharge today following a discussion of the leaving the hospital against medical advice.  Patient aware and in agreement with discharge and discharge plan.     ALLERGIES:   Allergies   Allergen Reactions    Iodine     Nitrous Oxide     Shellfish Allergy     Succinylcholine       MEDICATIONS PRIOR TO ADMISSION:   Prior to Admission medications    Medication Sig Start Date End Date Taking? Authorizing Provider   lamoTRIgine (LAMICTAL) 200 MG tablet Take 1 tablet by mouth 2 times daily 02/10/22  Yes Monai Hindes L Micheil Klaus, MD   levETIRAcetam (KEPPRA) 1000 MG tablet Take 1 tablet by mouth 2 times daily 02/10/22  Yes Mischelle Reeg L Camiya Vinal, MD   doxepin (SINEQUAN) 25 MG capsule Take 1 capsule by mouth nightly 02/10/22 04/11/22 Yes Nataliya Graig L Sudais Banghart, MD   ARIPiprazole (ABILIFY) 15 MG tablet Take 1 tablet by mouth daily 02/10/22 04/11/22  Yes Raeford Razor, MD       PAST MEDICAL HISTORY:   No past medical history on file.No past surgical history on file.   SOCIAL HISTORY:    Social History     Socioeconomic History    Marital status: Single     Spouse name: Not on file    Number of children: Not on file    Years of education: Not on file    Highest education level: Not on file   Occupational History    Not on file   Tobacco Use    Smoking status: Every Day     Packs/day: 1.00     Types: Cigarettes    Smokeless tobacco: Not on file   Vaping Use    Vaping Use: Every day   Substance and Sexual Activity    Alcohol use: Never    Drug use: Yes     Types: Marijuana Sherrie Mustache)      Comment: Daily    Sexual activity: Not on file   Other Topics Concern    Not on file   Social History Narrative    Not on file     Social Determinants of Health     Financial Resource Strain: Not on file   Food Insecurity: Not on file   Transportation Needs: Not on file   Physical Activity: Not on file   Stress: Not on file   Social Connections: Not on file   Intimate Partner Violence: Not on file   Housing Stability: Not on file      FAMILY HISTORY: Family history reviewed, pertinent family history as follows:  Family History   Family history unknown: Yes       REVIEW OF SYSTEMS:     Pertinent items are noted in the History of Present Illness.  All other Systems reviewed and are considered negative.           MENTAL STATUS EXAM & VITALS     MENTAL STATUS EXAM (MSE):    MSE FINDINGS ARE WITHIN NORMAL LIMITS (WNL) UNLESS OTHERWISE STATED BELOW. ( ALL OF THE BELOW CATEGORIES OF THE MSE HAVE BEEN REVIEWED (reviewed 02/11/2022) AND UPDATED AS DEEMED APPROPRIATE )  General Presentation age appropriate, guarded   Orientation oriented to time, place and person   Vital Signs  See below (reviewed 02/11/2022); Vital Signs (BP, Pulse, & Temp) are within normal limits if not listed below.   Gait and Station Stable/steady, no ataxia   Musculoskeletal System No extrapyramidal symptoms (EPS); no abnormal muscular movements or Tardive Dyskinesia (TD); muscle strength and tone are within normal limits   Language No aphasia or dysarthria   Speech:  normal volume   Thought Processes logical; normal rate; fair abstract reasoning   Thought Associations    Thought Content No AVH   Suicidal Ideations none   Homicidal Ideations none   Mood:  Anxious   Affect:  labile   Memory recent  Fair   Memory remote:  Fair   Concentration/Attention:  Average   Fund of Knowledge Average   Insight:  fair   Reliability Fair   Judgment:  Poor          VITALS:     Patient Vitals for the past 24 hrs:   Temp Pulse Resp BP   02/10/22 0830 98.2 F (36.8 C) 73  18 134/89       Wt Readings from Last 3 Encounters:   02/09/22 117.5 kg (259 lb 1.6 oz)  02/08/22 118.3 kg (260 lb 11.2 oz)     Temp Readings from Last 3 Encounters:   02/11/22 99 F (37.2 C) (Oral)   02/10/22 98.2 F (36.8 C) (Oral)   02/09/22 98 F (36.7 C) (Oral)     BP Readings from Last 3 Encounters:   02/11/22 (!) 135/91   02/10/22 134/89   02/09/22 (!) 150/90     Pulse Readings from Last 3 Encounters:   02/11/22 79   02/10/22 73   02/09/22 86            DATA     LABORATORY DATA:  Labs Reviewed - No data to display  Admission on 02/05/2022, Discharged on 02/09/2022   Component Date Value Ref Range Status    Lamotrigine 02/05/2022 2.7  2.0 - 20.0 ug/mL Final    Levetiracetam 02/05/2022 2.4 (L)  10.0 - 40.0 ug/mL Final    WBC 02/05/2022 8.8  4.1 - 11.1 K/uL Final    RBC 02/05/2022 4.89  4.10 - 5.70 M/uL Final    Hemoglobin 02/05/2022 14.5  12.1 - 17.0 g/dL Final    Hematocrit 02/05/2022 43.3  36.6 - 50.3 % Final    MCV 02/05/2022 88.5  80.0 - 99.0 FL Final    MCH 02/05/2022 29.7  26.0 - 34.0 PG Final    MCHC 02/05/2022 33.5  30.0 - 36.5 g/dL Final    RDW 02/05/2022 12.8  11.5 - 14.5 % Final    Platelets 02/05/2022 251  150 - 400 K/uL Final    MPV 02/05/2022 10.3  8.9 - 12.9 FL Final    Nucleated RBCs 02/05/2022 0.0  0 PER 100 WBC Final    nRBC 02/05/2022 0.00  0.00 - 0.01 K/uL Final    Neutrophils % 02/05/2022 56  32 - 75 % Final    Lymphocytes % 02/05/2022 32  12 - 49 % Final    Monocytes % 02/05/2022 9  5 - 13 % Final    Eosinophils % 02/05/2022 2  0 - 7 % Final    Basophils % 02/05/2022 1  0 - 1 % Final    Immature Granulocytes 02/05/2022 0  0.0 - 0.5 % Final    Neutrophils Absolute 02/05/2022 5.0  1.8 - 8.0 K/UL Final    Lymphocytes Absolute 02/05/2022 2.8  0.8 - 3.5 K/UL Final    Monocytes Absolute 02/05/2022 0.8  0.0 - 1.0 K/UL Final    Eosinophils Absolute 02/05/2022 0.2  0.0 - 0.4 K/UL Final    Basophils Absolute 02/05/2022 0.0  0.0 - 0.1 K/UL Final    Absolute Immature Granulocyte 02/05/2022 0.0   0.00 - 0.04 K/UL Final    Differential Type 02/05/2022 AUTOMATED    Final    Sodium 02/05/2022 140  136 - 145 mmol/L Final    Potassium 02/05/2022 4.0  3.5 - 5.1 mmol/L Final    Chloride 02/05/2022 108  97 - 108 mmol/L Final    CO2 02/05/2022 28  21 - 32 mmol/L Final    Anion Gap 02/05/2022 4 (L)  5 - 15 mmol/L Final    Glucose 02/05/2022 100  65 - 100 mg/dL Final    BUN 02/05/2022 13  6 - 20 MG/DL Final    Creatinine 02/05/2022 0.83  0.70 - 1.30 MG/DL Final    Bun/Cre Ratio 02/05/2022 16  12 - 20   Final    Est, Glom Filt Rate 02/05/2022 >60  >60 ml/min/1.62m Final    Calcium 02/05/2022 9.1  8.5 -  10.1 MG/DL Final    Total Bilirubin 02/05/2022 0.4  0.2 - 1.0 MG/DL Final    ALT 02/05/2022 38  12 - 78 U/L Final    AST 02/05/2022 16  15 - 37 U/L Final    Alk Phosphatase 02/05/2022 90  45 - 117 U/L Final    Total Protein 02/05/2022 7.5  6.4 - 8.2 g/dL Final    Albumin 02/05/2022 4.0  3.5 - 5.0 g/dL Final    Globulin 02/05/2022 3.5  2.0 - 4.0 g/dL Final    Albumin/Globulin Ratio 02/05/2022 1.1  1.1 - 2.2   Final    Specimen HOld 02/05/2022 1blu   Corrected    Comment: 02/05/2022 Add-on orders for these samples will be processed based on acceptable specimen integrity and analyte stability, which may vary by analyte.    Final    Amphetamine, Urine 02/05/2022 Negative  NEG   Final    Barbiturates, Urine 02/05/2022 Negative  NEG   Final    Benzodiazepines, Urine 02/05/2022 Negative  NEG   Final    Cocaine, Urine 02/05/2022 Negative  NEG   Final    Methadone, Urine 02/05/2022 Negative  NEG   Final    Opiates, Urine 02/05/2022 Negative  NEG   Final    PCP, Urine 02/05/2022 Negative  NEG   Final    THC, TH-Cannabinol, Urine 02/05/2022 Positive (A)  NEG   Final    Comments: 02/05/2022 (NOTE)   Final    Ethanol Lvl 02/05/2022 <10  <10 MG/DL Final    SARS-CoV-2, PCR 02/05/2022 Not detected  NOTD   Final    Rapid Influenza A By PCR 02/05/2022 Not detected  NOTD   Final    Rapid Influenza B By PCR 02/05/2022 Not detected  NOTD    Final    Specimen HOld 02/05/2022 Urine on hold in Microbiology dept for 2 days.  If unpreserved urine is submitted, it cannot be used for addtional testing after 24 hours, recollection will be required.    Final    Ventricular Rate 02/07/2022 58  BPM Final    Atrial Rate 02/07/2022 58  BPM Final    P-R Interval 02/07/2022 158  ms Final    QRS Duration 02/07/2022 100  ms Final    Q-T Interval 02/07/2022 422  ms Final    QTc Calculation (Bazett) 02/07/2022 414  ms Final    P Axis 02/07/2022 53  degrees Final    R Axis 02/07/2022 -13  degrees Final    T Axis 02/07/2022 8  degrees Final    Diagnosis 02/07/2022    Final                    Value:Sinus bradycardia  Otherwise normal ECG  When compared with ECG of 18-Feb-2017 01:14,  Vent. rate has decreased BY  37 BPM  Confirmed by Lawana Chambers, M.D., [place] [phone]) on 02/07/2022 2:16:16 PM      Hemoglobin A1C 02/08/2022 5.0  4.0 - 5.6 % Final    eAG 02/08/2022 97  mg/dL Final    Cholesterol, Total 02/08/2022 232 (H)  <200 MG/DL Final    Triglycerides 02/08/2022 285 (H)  <150 MG/DL Final    HDL 02/08/2022 35  MG/DL Final    LDL Calculated 02/08/2022 140 (H)  0 - 100 MG/DL Final    VLDL Cholesterol Calculated 02/08/2022 57  MG/DL Final    Chol/HDL Ratio 02/08/2022 6.6 (H)  0.0 - 5.0   Final  RADIOLOGY REPORTS:  @BSHSILASTIMGCAT ([id number]:1)@@RISRSLTEXT @           MEDICATIONS       ALL MEDICATIONS  No current facility-administered medications for this encounter.     Current Outpatient Medications   Medication Sig    lamoTRIgine (LAMICTAL) 200 MG tablet Take 1 tablet by mouth 2 times daily    levETIRAcetam (KEPPRA) 1000 MG tablet Take 1 tablet by mouth 2 times daily    doxepin (SINEQUAN) 25 MG capsule Take 1 capsule by mouth nightly    ARIPiprazole (ABILIFY) 15 MG tablet Take 1 tablet by mouth daily      SCHEDULED MEDICATIONS@CMEDSCH @             ASSESSMENT & PLAN        The patient, [name], is a 36 y.o.  male who presents at this time for treatment of the  following diagnoses:  Principal Problem:   MDD  Assessment: the patient presents with depression, ongoing and chronic, and was to be worked up for ECT but as no definitive timetable was established, chose to discharge home with outpatient follow up (which may include ECT at a future date). While he remains a chronic risk for self harm, he is not endorsing active thouhts of self harm at the time of initial assessment and does not meet TDO criteria.  Plan:  - Continue home regimen  - Outpatient followup as scheduled           PROVISIONAL & DISCHARGE DIAGNOSES:        Active Hospital Problems    Depression      *Major depressive disorder without psychotic features        DISCHARGE DIAGNOSIS:   Axis I:  SEE ABOVE  Axis II: SEE ABOVE  Axis III: SEE ABOVE  Axis IV:  lack of structure  Axis V:  30 on admission, 60 on discharge         A coordinated, multidisplinary treatment team (includes the nurse, unit pharmcist, 32) round was conducted for this initial evaluation with the patient present.     The following regarding was addressed during rounds with patient: the risks and benefits of the proposed treatment plan. The patient was given the opportunity to ask questions.    I have reviewed admission (and previous/old) labs and medical tests in the EHR and or transferring hospital documents. I will continue to order blood tests/labs and diagnostic tests as deemed appropriate and review results as they become available (see orders for details).    I have reviewed old psychiatric and medical records available in the EHR. I Will order additional psychiatric records from other institutions to further elucidate the nature of patient's psychopathology and review once available.    I will gather additional collateral information from friends, family and o/p treatment team to further elucidate the nature of patient's psychopathology and baselline level of psychiatric functioning.      ESTIMATED LENGTH OF STAY:   1 day per patient's request.       STRENGTHS:  Exercising self-direction/Resourceful, Access to housing/residential stability, and Interpersonal/supportive relationships (family, friends, peers)                 DISPOSITION:    Home. Patient to f/u with psychiatric/psychotherapy appointments. Pt to f/u with internist as directed.               FOLLOW-UP CARE:    Activity as tolerated  Regular Diety  Wound Care: none needed  PROGNOSIS:  Greatly dependent upon patient's ability to remain sober and to  f/u with psychiatric/psychotherapy appointments.          DISCHARGE MEDICATIONS: (no changes made).   Informed consent given for the use of following psychotropic medications.  Current Discharge Medication List        CONTINUE these medications which have CHANGED    Details   lamoTRIgine (LAMICTAL) 200 MG tablet Take 1 tablet by mouth 2 times daily  Qty: 60 tablet, Refills: 1      levETIRAcetam (KEPPRA) 1000 MG tablet Take 1 tablet by mouth 2 times daily  Qty: 60 tablet, Refills: 1      doxepin (SINEQUAN) 25 MG capsule Take 1 capsule by mouth nightly  Qty: 30 capsule, Refills: 1      ARIPiprazole (ABILIFY) 15 MG tablet Take 1 tablet by mouth daily  Qty: 30 tablet, Refills: 1           STOP taking these medications       nicotine polacrilex (COMMIT) 4 MG lozenge Comments:   Reason for Stopping:                                            SIGNED:    Raeford Razor, MD  02/11/2022

## 2022-02-10 NOTE — Discharge Instructions (Addendum)
DISCHARGE SUMMARY    NAME:[name]  DOB: [birth year]  MRN: [id number]    The patient [name] exhibits the ability to control behavior in a less restrictive environment.  Patient's level of functioning is improving.  No assaultive/destructive behavior has been observed for the past 24 hours.  No suicidal/homicidal threat or behavior has been observed for the past 24 hours.  There is no evidence of serious medication side effects.  Patient has not been in physical or protective restraints for at least the past 24 hours.    AMA DISCHARGE     If weapons involved, how are they secured? No access    Is patient aware of and in agreement with discharge plan? Yes    Arrangements for medication:  Prescriptions sent to pharmacy     Copy of discharge instructions to provider?:  yes    Arrangements for transportation home:  Roundtrip    Keep all follow up appointments as scheduled, continue to take prescribed medications per physician instructions.  Mental health crisis number:  911 or your local mental health crisis line number at [phone]      Mental Health Emergency WARM LINE      1-866-400-MHAV [phone])      M-F: 9am to 9pm      Sat & Sun: 5pm - 9pm  National suicide prevention lines:                             1-800-SUICIDE 779-470-3150)       1-800-273-TALK 7475182565)   24/7 Crisis Text Line:  Text HOME to [phone]

## 2022-02-10 NOTE — H&P (Signed)
INITIAL PSYCHIATRIC EVALUATION & DISCHARGE SUMMARY           IDENTIFICATION:    Patient Name  [name]   Date of Birth 02-Jun-1986   CSN [id number]   Medical Record Number  [id number]      Age  36 y.o.   PCP No primary care provider on file.   Admit date:  02/09/2022    Room Number  035/00  @ [facility]   Date of Service  02/10/2022            HISTORY         TYPE OF DISCHARGE: AMA        CONDITION AT DISCHARGE: Fair       REASON FOR HOSPITALIZATION:  CC: "suicidal ideation". Pt admitted under a voluntary basis for severe depression with suicidal ideations proving to be an imminent danger to self.    HISTORY OF PRESENT ILLNESS:    The patient, [name], is a 36 y.o.  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  Additional symptomatology include mood lability.  The above symptoms have been present for 2+ weeks. These symptoms are of moderate to high severity. These symptoms are constant in nature.  The patient's condition has been precipitated by psychosocial stressors.    The patient was transferred from a sister hospital to potentially start ECT. Per previous records and discussion with procedurist, the patient was to first get clearance from anesthesia; there was also questions about his diagnosis and candidacy for the treatment. When this was explained to the patient, he requested to discharge home rather than wait to ascertain the appropriateness of the procedure for his circumstances. Patient given outpatient information and voiced understanding.    Patient with request for discharge today. There are no grounds to seek a TDO.    At time of discharge, Rollin Kotowski is without significant problems of depression, psychosis, or mania. Patient free of  suicidal and homicidal ideations (appears to be a chronic, elevated risk of suicide or homicide) and reports many positive predictive factors in terms of not attempting suicide or homicide. Overall presentation at time of discharge is most consistent with the diagnosis of MDD.    Patient has maximized benefit to be derived from acute inpatient psychiatric treatment.  All members of the treatment team concur with each other in regards to plans for discharge today following a discussion of the leaving the hospital against medical advice.  Patient aware and in agreement with discharge and discharge plan.     ALLERGIES:   Allergies   Allergen Reactions    Iodine     Nitrous Oxide     Shellfish Allergy     Succinylcholine       MEDICATIONS PRIOR TO ADMISSION:   Prior to Admission medications    Medication Sig Start Date End Date Taking? Authorizing Provider   lamoTRIgine (LAMICTAL) 200 MG tablet Take 1 tablet by mouth 2 times daily 02/10/22  Yes Marykathryn Carboni L Duston Smolenski, MD   levETIRAcetam (KEPPRA) 1000 MG tablet Take 1 tablet by mouth 2 times daily 02/10/22  Yes Mercer Stallworth L Bettina Warn, MD   doxepin (SINEQUAN) 25 MG capsule Take 1 capsule by mouth nightly 02/10/22 04/11/22 Yes Armanii Pressnell L Zaki Gertsch, MD   ARIPiprazole (ABILIFY) 15 MG tablet Take 1 tablet by mouth daily 02/10/22 04/11/22  Yes Raeford Razor, MD       PAST MEDICAL HISTORY:   No past medical history on file.No past surgical history on file.   SOCIAL HISTORY:    Social History     Socioeconomic History    Marital status: Single     Spouse name: Not on file    Number of children: Not on file    Years of education: Not on file    Highest education level: Not on file   Occupational History    Not on file   Tobacco Use    Smoking status: Every Day     Packs/day: 1.00     Types: Cigarettes    Smokeless tobacco: Not on file   Vaping Use    Vaping Use: Every day   Substance and Sexual Activity    Alcohol use: Never    Drug use: Yes     Types: Marijuana Sherrie Mustache)      Comment: Daily    Sexual activity: Not on file   Other Topics Concern    Not on file   Social History Narrative    Not on file     Social Determinants of Health     Financial Resource Strain: Not on file   Food Insecurity: Not on file   Transportation Needs: Not on file   Physical Activity: Not on file   Stress: Not on file   Social Connections: Not on file   Intimate Partner Violence: Not on file   Housing Stability: Not on file      FAMILY HISTORY: Family history reviewed, pertinent family history as follows:  Family History   Family history unknown: Yes       REVIEW OF SYSTEMS:     Pertinent items are noted in the History of Present Illness.  All other Systems reviewed and are considered negative.           MENTAL STATUS EXAM & VITALS     MENTAL STATUS EXAM (MSE):    MSE FINDINGS ARE WITHIN NORMAL LIMITS (WNL) UNLESS OTHERWISE STATED BELOW. ( ALL OF THE BELOW CATEGORIES OF THE MSE HAVE BEEN REVIEWED (reviewed 02/10/2022) AND UPDATED AS DEEMED APPROPRIATE )  General Presentation age appropriate, guarded   Orientation oriented to time, place and person   Vital Signs  See below (reviewed 02/10/2022); Vital Signs (BP, Pulse, & Temp) are within normal limits if not listed below.   Gait and Station Stable/steady, no ataxia   Musculoskeletal System No extrapyramidal symptoms (EPS); no abnormal muscular movements or Tardive Dyskinesia (TD); muscle strength and tone are within normal limits   Language No aphasia or dysarthria   Speech:  normal volume   Thought Processes logical; normal rate; fair abstract reasoning   Thought Associations    Thought Content No AVH   Suicidal Ideations none   Homicidal Ideations none   Mood:  Anxious   Affect:  labile   Memory recent  Fair   Memory remote:  Fair   Concentration/Attention:  Average   Fund of Knowledge Average   Insight:  fair   Reliability Fair   Judgment:  Poor          VITALS:     Patient Vitals for the past 24 hrs:   Temp Pulse Resp BP SpO2   02/10/22 0830 98.2 F (36.8 C)  73 18 134/89 --   02/09/22 2215 98.3 F (36.8 C) 96 16 135/82 96 %   02/09/22 1700 98.8 F (37.1  C) 72 16 (!) 152/89 97 %     Wt Readings from Last 3 Encounters:   02/09/22 117.5 kg (259 lb 1.6 oz)   02/08/22 118.3 kg (260 lb 11.2 oz)     Temp Readings from Last 3 Encounters:   02/10/22 98.2 F (36.8 C) (Oral)   02/09/22 98 F (36.7 C) (Oral)   01/15/22 98.4 F (36.9 C)     BP Readings from Last 3 Encounters:   02/10/22 134/89   02/09/22 (!) 150/90   01/15/22 (!) 143/96     Pulse Readings from Last 3 Encounters:   02/10/22 73   02/09/22 86   01/15/22 87            DATA     LABORATORY DATA:  Labs Reviewed - No data to display  Admission on 02/05/2022, Discharged on 02/09/2022   Component Date Value Ref Range Status    Lamotrigine 02/05/2022 2.7  2.0 - 20.0 ug/mL Final    Levetiracetam 02/05/2022 2.4 (L)  10.0 - 40.0 ug/mL Final    WBC 02/05/2022 8.8  4.1 - 11.1 K/uL Final    RBC 02/05/2022 4.89  4.10 - 5.70 M/uL Final    Hemoglobin 02/05/2022 14.5  12.1 - 17.0 g/dL Final    Hematocrit 02/05/2022 43.3  36.6 - 50.3 % Final    MCV 02/05/2022 88.5  80.0 - 99.0 FL Final    MCH 02/05/2022 29.7  26.0 - 34.0 PG Final    MCHC 02/05/2022 33.5  30.0 - 36.5 g/dL Final    RDW 02/05/2022 12.8  11.5 - 14.5 % Final    Platelets 02/05/2022 251  150 - 400 K/uL Final    MPV 02/05/2022 10.3  8.9 - 12.9 FL Final    Nucleated RBCs 02/05/2022 0.0  0 PER 100 WBC Final    nRBC 02/05/2022 0.00  0.00 - 0.01 K/uL Final    Neutrophils % 02/05/2022 56  32 - 75 % Final    Lymphocytes % 02/05/2022 32  12 - 49 % Final    Monocytes % 02/05/2022 9  5 - 13 % Final    Eosinophils % 02/05/2022 2  0 - 7 % Final    Basophils % 02/05/2022 1  0 - 1 % Final    Immature Granulocytes 02/05/2022 0  0.0 - 0.5 % Final    Neutrophils Absolute 02/05/2022 5.0  1.8 - 8.0 K/UL Final    Lymphocytes Absolute 02/05/2022 2.8  0.8 - 3.5 K/UL Final    Monocytes Absolute 02/05/2022 0.8  0.0 - 1.0 K/UL Final    Eosinophils Absolute 02/05/2022 0.2  0.0 - 0.4 K/UL Final     Basophils Absolute 02/05/2022 0.0  0.0 - 0.1 K/UL Final    Absolute Immature Granulocyte 02/05/2022 0.0  0.00 - 0.04 K/UL Final    Differential Type 02/05/2022 AUTOMATED    Final    Sodium 02/05/2022 140  136 - 145 mmol/L Final    Potassium 02/05/2022 4.0  3.5 - 5.1 mmol/L Final    Chloride 02/05/2022 108  97 - 108 mmol/L Final    CO2 02/05/2022 28  21 - 32 mmol/L Final    Anion Gap 02/05/2022 4 (L)  5 - 15 mmol/L Final    Glucose 02/05/2022 100  65 - 100 mg/dL Final    BUN 02/05/2022 13  6 - 20 MG/DL Final    Creatinine 02/05/2022 0.83  0.70 - 1.30 MG/DL Final    Bun/Cre Ratio 02/05/2022 16  12 - 20   Final    Est, Glom Filt Rate 02/05/2022 >60  >60 ml/min/1.58m Final    Calcium 02/05/2022 9.1  8.5 - 10.1 MG/DL Final    Total Bilirubin 02/05/2022 0.4  0.2 - 1.0 MG/DL Final    ALT 02/05/2022 38  12 - 78 U/L Final    AST 02/05/2022 16  15 - 37 U/L Final    Alk Phosphatase 02/05/2022 90  45 - 117 U/L Final    Total Protein 02/05/2022 7.5  6.4 - 8.2 g/dL Final    Albumin 02/05/2022 4.0  3.5 - 5.0 g/dL Final    Globulin 02/05/2022 3.5  2.0 - 4.0 g/dL Final    Albumin/Globulin Ratio 02/05/2022 1.1  1.1 - 2.2   Final    Specimen HOld 02/05/2022 1blu   Corrected    Comment: 02/05/2022 Add-on orders for these samples will be processed based on acceptable specimen integrity and analyte stability, which may vary by analyte.    Final    Amphetamine, Urine 02/05/2022 Negative  NEG   Final    Barbiturates, Urine 02/05/2022 Negative  NEG   Final    Benzodiazepines, Urine 02/05/2022 Negative  NEG   Final    Cocaine, Urine 02/05/2022 Negative  NEG   Final    Methadone, Urine 02/05/2022 Negative  NEG   Final    Opiates, Urine 02/05/2022 Negative  NEG   Final    PCP, Urine 02/05/2022 Negative  NEG   Final    THC, TH-Cannabinol, Urine 02/05/2022 Positive (A)  NEG   Final    Comments: 02/05/2022 (NOTE)   Final    Ethanol Lvl 02/05/2022 <10  <10 MG/DL Final    SARS-CoV-2, PCR 02/05/2022 Not detected  NOTD   Final    Rapid Influenza A  By PCR 02/05/2022 Not detected  NOTD   Final    Rapid Influenza B By PCR 02/05/2022 Not detected  NOTD   Final    Specimen HOld 02/05/2022 Urine on hold in Microbiology dept for 2 days.  If unpreserved urine is submitted, it cannot be used for addtional testing after 24 hours, recollection will be required.    Final    Ventricular Rate 02/07/2022 58  BPM Final    Atrial Rate 02/07/2022 58  BPM Final    P-R Interval 02/07/2022 158  ms Final    QRS Duration 02/07/2022 100  ms Final    Q-T Interval 02/07/2022 422  ms Final    QTc Calculation (Bazett) 02/07/2022 414  ms Final    P Axis 02/07/2022 53  degrees Final    R Axis 02/07/2022 -13  degrees Final    T Axis 02/07/2022 8  degrees Final    Diagnosis 02/07/2022    Final                    Value:Sinus bradycardia  Otherwise normal ECG  When compared with ECG of 18-Feb-2017 01:14,  Vent. rate has decreased BY  37 BPM  Confirmed by ALawana Chambers M.D., Darryn ([phone] on 02/07/2022 2:16:16 PM      Hemoglobin A1C 02/08/2022 5.0  4.0 - 5.6 % Final    eAG 02/08/2022 97  mg/dL Final    Cholesterol, Total 02/08/2022 232 (H)  <200 MG/DL Final    Triglycerides 02/08/2022 285 (H)  <150 MG/DL Final    HDL 02/08/2022 35  MG/DL Final    LDL Calculated 02/08/2022 140 (H)  0 - 100 MG/DL Final  VLDL Cholesterol Calculated 02/08/2022 57  MG/DL Final    Chol/HDL Ratio 02/08/2022 6.6 (H)  0.0 - 5.0   Final        RADIOLOGY REPORTS:  @BSHSILASTIMGCAT ([id number]:1)@@RISRSLTEXT @           MEDICATIONS       ALL MEDICATIONS  Current Facility-Administered Medications   Medication Dose Route Frequency    ARIPiprazole (ABILIFY) tablet 15 mg  15 mg Oral Daily    doxepin (SINEQUAN) capsule 25 mg  25 mg Oral Nightly    lamoTRIgine (LAMICTAL) tablet 150 mg  150 mg Oral BID    levETIRAcetam (KEPPRA) tablet 1,000 mg  1,000 mg Oral BID    acetaminophen (TYLENOL) tablet 650 mg  650 mg Oral Q4H PRN    polyethylene glycol (GLYCOLAX) packet 17 g  17 g Oral Daily PRN    aluminum & magnesium hydroxide-simethicone  (MAALOX) 200-200-20 MG/5ML suspension 30 mL  30 mL Oral Q6H PRN    hydrOXYzine HCl (ATARAX) tablet 50 mg  50 mg Oral TID PRN    haloperidol (HALDOL) tablet 5 mg  5 mg Oral Q4H PRN    Or    haloperidol lactate (HALDOL) injection 5 mg  5 mg IntraMUSCular Q4H PRN    diphenhydrAMINE (BENADRYL) injection 50 mg  50 mg IntraMUSCular Q4H PRN    traZODone (DESYREL) tablet 50 mg  50 mg Oral Nightly PRN    nicotine polacrilex (COMMIT) lozenge 2 mg  2 mg Oral Q1H PRN      SCHEDULED MEDICATIONS@CMEDSCH @             ASSESSMENT & PLAN        The patient, Ilay Capshaw, is a 36 y.o.  male who presents at this time for treatment of the following diagnoses:  Principal Problem:   MDD  Assessment: the patient presents with depression, ongoing and chronic, and was to be worked up for ECT but as no definitive timetable was established, chose to discharge home with outpatient follow up (which may include ECT at a future date). While he remains a chronic risk for self harm, he is not endorsing active thouhts of self harm at the time of initial assessment and does not meet TDO criteria.  Plan:  - Continue home regimen  - Outpatient followup as scheduled           PROVISIONAL & DISCHARGE DIAGNOSES:        Active Hospital Problems    *Depression        DISCHARGE DIAGNOSIS:   Axis I:  SEE ABOVE  Axis II: SEE ABOVE  Axis III: SEE ABOVE  Axis IV:  lack of structure  Axis V:  30 on admission, 60 on discharge         A coordinated, multidisplinary treatment team (includes the nurse, unit pharmcist, 32) round was conducted for this initial evaluation with the patient present.     The following regarding was addressed during rounds with patient: the risks and benefits of the proposed treatment plan. The patient was given the opportunity to ask questions.    I have reviewed admission (and previous/old) labs and medical tests in the EHR and or transferring hospital documents. I will continue to order blood tests/labs and  diagnostic tests as deemed appropriate and review results as they become available (see orders for details).    I have reviewed old psychiatric and medical records available in the EHR. I Will order additional psychiatric records from other institutions  to further elucidate the nature of patient's psychopathology and review once available.    I will gather additional collateral information from friends, family and o/p treatment team to further elucidate the nature of patient's psychopathology and baselline level of psychiatric functioning.      ESTIMATED LENGTH OF STAY:  1 day per patient's request.       STRENGTHS:  Exercising self-direction/Resourceful, Access to housing/residential stability, and Interpersonal/supportive relationships (family, friends, peers)                 DISPOSITION:    Home. Patient to f/u with psychiatric/psychotherapy appointments. Pt to f/u with internist as directed.               FOLLOW-UP CARE:    Activity as tolerated  Regular Diety  Wound Care: none needed             PROGNOSIS:  Greatly dependent upon patient's ability to remain sober and to  f/u with psychiatric/psychotherapy appointments.          DISCHARGE MEDICATIONS: (no changes made).   Informed consent given for the use of following psychotropic medications.  Current Discharge Medication List        CONTINUE these medications which have CHANGED    Details   lamoTRIgine (LAMICTAL) 200 MG tablet Take 1 tablet by mouth 2 times daily  Qty: 60 tablet, Refills: 1      levETIRAcetam (KEPPRA) 1000 MG tablet Take 1 tablet by mouth 2 times daily  Qty: 60 tablet, Refills: 1      doxepin (SINEQUAN) 25 MG capsule Take 1 capsule by mouth nightly  Qty: 30 capsule, Refills: 1      ARIPiprazole (ABILIFY) 15 MG tablet Take 1 tablet by mouth daily  Qty: 30 tablet, Refills: 1           STOP taking these medications       nicotine polacrilex (COMMIT) 4 MG lozenge Comments:   Reason for Stopping:                                            SIGNED:     Raeford Razor, MD  02/10/2022

## 2022-02-10 NOTE — Care Coordination-Inpatient (Signed)
02/10/22 0825   ITP   Date of Plan 02/10/22   Date of Next Review 02/11/22   Primary Diagnosis Code Major depressive disorder   Barriers to Treatment Need for psychoeducation   Strengths Incorporated in Plan Acknowledging need for assistance;Natural supports   Plan of Care   Long Term Goal (LTG) Stated in patient/guardian terms To maintain stability independently   Short Term Goal 1   Short Term Goal 1 Client will learn and demonstrate effective coping skills   Baseline Functioning Client experiencing SI, forming plan to harm self   Target Client feels confident using healthy coping skills; does not feel compelled to harm self   Objectives Other (comment)  (Client will form new coping skills)   Intervention 1 Group therapy   Frequency Daily   Measured by Staff observation;Self report   Staff Responsible Clinical staff;[facility] staff   Intervention 2 Milieu therapy and support   Frequency Daily   Measured by Self report;Staff observation   Staff Responsible Clinical staff;[facility] staff   STG Goal 1 Status: Patient Appears to be  Progressing toward treatment plan goal   Short Term Goal 2   Short Term Goal 2 Client will maintain compliance with medication regime   Baseline Functioning Client stopped taking medication; reason unknown   Target Client complies with medication regimen that suits his individual needs   Objectives Other (comment)  (Client will take medication as prescribed in hospital)   Intervention 1 Monitor medications   Frequency Daily   Measured by Self report;Staff observation;Other (comment)  (Chart review)   Intervention 2 Group therapy   Frequency Daily   Measured by Staff observation;Self report   Staff Responsible Clinical staff;[facility] staff   STG Goal 2 Status: Patient Appears to be  Progressing toward treatment plan goal   Crisis/Safety/Discharge Plan   Crisis/Safety Plan Standard program interventions and protocol   Comprehensive Assessment Completion Date 02/10/22   Discharge Plan Return home with  outpatient resources     Bettye Boeck, MSW

## 2022-02-10 NOTE — Group Note (Signed)
Group Therapy Note    Date: 02/10/2022    Group Start Time: 0900  Group End Time: 1000  Group Topic: Topic Group    [facility]    Kennieth Plotts S Shahid Flori        Group Therapy Note    Attendees:        Patient's Goal:  To identify positive coping strategies       Status After Intervention:  Improved    Participation Level: Interactive    Participation Quality: Attentive and Sharing      Speech:  normal      Thought Process/Content: Logical      Affective Functioning: Congruent      Mood: euthymic      Level of consciousness:  Attentive      Response to Learning: Progressing to goal      Endings: None Reported    Modes of Intervention: Education      Discipline Responsible: Recreational Therapist      Signature:  Ilai Hiller S Areebah Meinders

## 2022-02-11 ENCOUNTER — Inpatient Hospital Stay
Admit: 2022-02-11 | Discharge: 2022-02-11 | Disposition: A | Discharge status: Home / Self Care | Payer: PRIVATE HEALTH INSURANCE | Attending: Emergency Medicine

## 2022-02-11 DIAGNOSIS — R569 Unspecified convulsions: Secondary | ICD-10-CM

## 2022-02-11 LAB — EKG 12-LEAD
Atrial Rate: 89 {beats}/min
Diagnosis: NORMAL
P Axis: 57 degrees
P-R Interval: 148 ms
Q-T Interval: 362 ms
QRS Duration: 96 ms
QTc Calculation (Bazett): 440 ms
R Axis: 21 degrees
T Axis: 28 degrees
Ventricular Rate: 89 {beats}/min

## 2022-02-11 MED ORDER — LEVETIRACETAM 250 MG PO TABS
250 MG | Freq: Once | ORAL | Status: AC
Start: 2022-02-11 — End: 2022-02-11
  Administered 2022-02-11: 10:00:00 1000 mg via ORAL

## 2022-02-11 MED ORDER — LAMOTRIGINE 100 MG PO TABS
100 MG | Freq: Once | ORAL | Status: AC
Start: 2022-02-11 — End: 2022-02-11
  Administered 2022-02-11: 10:00:00 200 mg via ORAL

## 2022-02-11 MED FILL — LEVETIRACETAM 250 MG PO TABS: 250 MG | ORAL | Qty: 4

## 2022-02-11 MED FILL — LAMOTRIGINE 100 MG PO TABS: 100 MG | ORAL | Qty: 2

## 2022-02-11 NOTE — Discharge Instructions (Signed)
Please follow up [facility] neurology as previously scheduled. Thank you.

## 2022-02-11 NOTE — ED Notes (Signed)
Patient refused IV and blood work. MD notified.     Garlan Fillers, RN  02/11/22 [phone]

## 2022-02-11 NOTE — ED Provider Notes (Signed)
[facility] EMERGENCY DEP  EMERGENCY DEPARTMENT ENCOUNTER      Pt Name: [name]  MRN: [id number]  Birthdate September 19, 1985  Date of evaluation: 02/11/2022  Provider: Julieanne Manson, MD    CHIEF COMPLAINT       Chief Complaint   Patient presents with    Seizures         HISTORY OF PRESENT ILLNESS   (Location/Symptom, Timing/Onset, Context/Setting, Quality, Duration, Modifying Factors, Severity)  Note limiting factors.   Patient is a 36 year old male with history of seizures who presents after seizure while at home.  Patient reports he works in [employer], and every time he has a seizure he likes to come to get checked out because he does not like to have a seizure at work.  Patient says this is only a second seizure in the last several months, and has an upcoming appointment with [facility] neurology.  Reports compliance with his home meds, and reports that his morning meds are due in a few hours.  Denies any complaints at this time.    Patient does not want any blood work checked because it was checked recently during his inpatient psych admission to [facility].  He would like to drink water instead.        Nursing Notes were reviewed.    REVIEW OF SYSTEMS    Not Required     Review of Systems    PAST MEDICAL HISTORY   No past medical history on file.    SURGICAL HISTORY     No past surgical history on file.    CURRENT MEDICATIONS       Discharge Medication List as of 02/11/2022  6:12 AM        CONTINUE these medications which have NOT CHANGED    Details   lamoTRIgine (LAMICTAL) 200 MG tablet Take 1 tablet by mouth 2 times daily, Disp-60 tablet, R-1Normal      levETIRAcetam (KEPPRA) 1000 MG tablet Take 1 tablet by mouth 2 times daily, Disp-60 tablet, R-1Normal      doxepin (SINEQUAN) 25 MG capsule Take 1 capsule by mouth nightly, Disp-30 capsule, R-1Normal      ARIPiprazole (ABILIFY) 15 MG tablet Take 1 tablet by mouth daily, Disp-30 tablet, R-1Normal             ALLERGIES     Iodine, Nitrous oxide, Shellfish  allergy, and Succinylcholine    FAMILY HISTORY       Family History   Family history unknown: Yes        SOCIAL HISTORY       Social History     Socioeconomic History    Marital status: Single   Tobacco Use    Smoking status: Every Day     Packs/day: 1.00     Types: Cigarettes   Vaping Use    Vaping Use: Every day   Substance and Sexual Activity    Alcohol use: Never    Drug use: Yes     Types: Marijuana (Weed)     Comment: Daily       PHYSICAL EXAM     Vitals:    Vitals:    02/11/22 0441 02/11/22 0456 02/11/22 0511 02/11/22 0614   BP: (!) 130/91 (!) 177/158 138/85 (!) 135/91   Pulse: 77 75 73 79   Resp: 20 22 15 13    Temp:       TempSrc:       SpO2: 95% 95% 99% 97%  Physical Exam  Constitutional:       General: He is not in acute distress.     Appearance: Normal appearance. He is not ill-appearing, toxic-appearing or diaphoretic.   HENT:      Head: Normocephalic.      Right Ear: External ear normal.      Left Ear: External ear normal.      Nose: Nose normal.      Mouth/Throat:      Mouth: Mucous membranes are moist.   Eyes:      General: No scleral icterus.  Cardiovascular:      Rate and Rhythm: Normal rate and regular rhythm.   Pulmonary:      Effort: Pulmonary effort is normal. No respiratory distress.      Breath sounds: No stridor.   Abdominal:      General: There is no distension.   Musculoskeletal:         General: No deformity.      Cervical back: No rigidity.   Skin:     Coloration: Skin is not jaundiced.   Neurological:      General: No focal deficit present.      Mental Status: He is oriented to person, place, and time.      Cranial Nerves: No cranial nerve deficit.      Sensory: No sensory deficit.      Motor: No weakness.   Psychiatric:         Behavior: Behavior normal.       DIAGNOSTIC RESULTS       RADIOLOGY:   Non-plain film images such as CT, Ultrasound and MRI are read by the radiologist. Plain radiographic images are visualized and preliminarily interpreted by the emergency physician with  the below findings:    See ED course below.     Interpretation per the Radiologist below, if available at the time of this note:    No orders to display        LABS:  Labs Reviewed   LEVETIRACETAM LEVEL   EXTRA TUBES HOLD       All other labs were within normal range or not returned as of this dictation.    EMERGENCY DEPARTMENT COURSE and DIFFERENTIAL DIAGNOSIS/MDM:   Medical Decision Making  Differential diagnosis includes seizure versus syncope    Patient is hemodynamically stable, well-appearing, does not want any blood work checked at this time.  Would like just his morning dose meds, and able to tolerate p.o. in ED.  Has an upcoming [facility] neurology appointment, and is stable for discharge at this time.    Amount and/or Complexity of Data Reviewed  Labs: ordered.  ECG/medicine tests: ordered.    Risk  Prescription drug management.        EKG: All EKG's are interpreted by the Emergency Department Physician who either signs or Co-signs this chart in the absence of a cardiologist.    ED Course as of 02/11/22 2149   Wed Feb 11, 2022   0452 ED EKG interpretation:  Rhythm: normal sinus rhythm; and regular . Rate (approx.): 89; Axis: normal; ST/T wave: normal; No evidence of acute coronary ischemia.   [AL]   0539 Pt says he recently got labs and does not want an IV. Would like to hydrate orally.  [AL]      ED Course User Index  [AL] Julieanne Manson, MD       CRITICAL CARE TIME   Total Cirtical Care time was 0 minutes, excluding separately reportable  procedures. There was a high probability of clinically significant/life threatening deterioration in the patient's condition with required my urgent intervention.     CONSULTS:  None    PROCEDURES:  Unless otherwise noted below, none     Procedures    FINAL IMPRESSION      1. Seizure [facility])          DISPOSITION/PLAN   DISPOSITION Decision To Discharge 02/11/2022 06:11:20 AM    PATIENT REFERRED TO:  [facility] EMERGENCY DEP  [address]  [place] [address]  [phone]    As  needed      DISCHARGE MEDICATIONS:  Discharge Medication List as of 02/11/2022  6:12 AM        Patient's results have been reviewed with them.  Patient and/or family have verbally conveyed their understanding and agreement of the patient's signs, symptoms, diagnosis, treatment and prognosis and additionally agree to follow up as recommended or return to the Emergency Room should their condition change prior to follow-up.  Discharge instructions have also been provided to the patient with some educational information regarding their diagnosis as well a list of reasons why they would want to return to the ER prior to their follow-up appointment should their condition change.    (Please note that portions of this note were completed with a voice recognition program.  Efforts were made to edit the dictations but occasionally words are mis-transcribed.)    Julieanne Manson, MD (electronically signed)  Emergency Attending Physician       Julieanne Manson, MD  02/11/22 2155

## 2022-02-11 NOTE — ED Notes (Signed)
The patient left the Emergency Department ambulatory, alert and oriented and in no acute distress. The patient was encouraged to call or return to the ED for worsening issues or problems and was encouraged to schedule a follow up appointment for continuing care.      The patient verbalized understanding of discharge instructions and all questions were answered. The patient has no further concerns at this time.         Garlan Fillers, RN  02/11/22 [phone]

## 2022-02-11 NOTE — ED Triage Notes (Signed)
Patient arrived via EMS with a CC of seizure. Patient was walking down the street when he started to experience an aura. Patient stated he woke up on the street. Seizure duration unknown. Patient has a hx of seizures. Recent seizure 1.5 weeks ago. Patient is compliant with medications (keppra and lamictal). Patient experiencing headache which he stated is common after his seizures.

## 2022-03-24 ENCOUNTER — Observation Stay: Admit: 2022-03-24 | Payer: PRIVATE HEALTH INSURANCE

## 2022-03-24 ENCOUNTER — Inpatient Hospital Stay
Admit: 2022-03-24 | Discharge: 2022-03-25 | Disposition: A | Discharge status: Home / Self Care | Payer: PRIVATE HEALTH INSURANCE | Admitting: Internal Medicine

## 2022-03-24 ENCOUNTER — Observation Stay: Payer: PRIVATE HEALTH INSURANCE

## 2022-03-24 ENCOUNTER — Emergency Department: Admit: 2022-03-24 | Payer: PRIVATE HEALTH INSURANCE

## 2022-03-24 DIAGNOSIS — R569 Unspecified convulsions: Secondary | ICD-10-CM

## 2022-03-24 DIAGNOSIS — G40409 Other generalized epilepsy and epileptic syndromes, not intractable, without status epilepticus: Secondary | ICD-10-CM

## 2022-03-24 LAB — COMPREHENSIVE METABOLIC PANEL
ALT: 34 U/L (ref 16–61)
AST: 14 U/L (ref 10–38)
Albumin/Globulin Ratio: 1.2 (ref 0.8–1.7)
Albumin: 3.6 g/dL (ref 3.4–5.0)
Alk Phosphatase: 82 U/L (ref 45–117)
Anion Gap: 3 mmol/L (ref 3.0–18)
BUN: 12 MG/DL (ref 7.0–18)
Bun/Cre Ratio: 14 (ref 12–20)
CO2: 28 mmol/L (ref 21–32)
Calcium: 8.8 MG/DL (ref 8.5–10.1)
Chloride: 111 mmol/L (ref 100–111)
Creatinine: 0.85 MG/DL (ref 0.6–1.3)
Est, Glom Filt Rate: >60 mL/min/{1.73_m2} (ref >60)
Globulin: 2.9 g/dL (ref 2.0–4.0)
Glucose: 120 mg/dL — ABNORMAL HIGH (ref 74–99)
Potassium: 3.5 mmol/L (ref 3.5–5.5)
Sodium: 142 mmol/L (ref 136–145)
Total Bilirubin: 0.2 MG/DL (ref 0.2–1.0)
Total Protein: 6.5 g/dL (ref 6.4–8.2)

## 2022-03-24 LAB — URINE DRUG SCREEN
Amphetamine, Urine: NEGATIVE
Barbiturates, Urine: NEGATIVE
Benzodiazepines, Urine: NEGATIVE
Cocaine, Urine: NEGATIVE
Methadone, Urine: NEGATIVE
Opiates, Urine: POSITIVE — AB
PCP, Urine: NEGATIVE
THC, TH-Cannabinol, Urine: POSITIVE — AB

## 2022-03-24 LAB — CBC WITH AUTO DIFFERENTIAL
Absolute Immature Granulocyte: 0 10*3/uL (ref 0.00–0.04)
Basophils %: 1 % (ref 0–2)
Basophils Absolute: 0 10*3/uL (ref 0.0–0.1)
Eosinophils %: 2 % (ref 0–5)
Eosinophils Absolute: 0.2 10*3/uL (ref 0.0–0.4)
Hematocrit: 39.5 % (ref 36.0–48.0)
Hemoglobin: 13.6 g/dL (ref 13.0–16.0)
Immature Granulocytes: 0 % (ref 0.0–0.5)
Lymphocytes %: 30 % (ref 21–52)
Lymphocytes Absolute: 2.2 10*3/uL (ref 0.9–3.6)
MCH: 30.3 PG (ref 24.0–34.0)
MCHC: 34.4 g/dL (ref 31.0–37.0)
MCV: 88 FL (ref 78.0–100.0)
MPV: 10.8 FL (ref 9.2–11.8)
Monocytes %: 8 % (ref 3–10)
Monocytes Absolute: 0.6 10*3/uL (ref 0.05–1.2)
Neutrophils %: 59 % (ref 40–73)
Neutrophils Absolute: 4.3 10*3/uL (ref 1.8–8.0)
Nucleated RBCs: 0 PER 100 WBC
Platelets: 217 10*3/uL (ref 135–420)
RBC: 4.49 M/uL (ref 4.35–5.65)
RDW: 12.5 % (ref 11.6–14.5)
WBC: 7.4 10*3/uL (ref 4.6–13.2)
nRBC: 0 10*3/uL (ref 0.00–0.01)

## 2022-03-24 LAB — EKG 12-LEAD
Atrial Rate: 58 {beats}/min
P Axis: 36 degrees
P-R Interval: 154 ms
Q-T Interval: 418 ms
QRS Duration: 100 ms
QTc Calculation (Bazett): 410 ms
R Axis: -4 degrees
T Axis: 14 degrees
Ventricular Rate: 58 {beats}/min

## 2022-03-24 LAB — ETHANOL: Ethanol Lvl: <3 MG/DL (ref 0–3)

## 2022-03-24 LAB — URINALYSIS
Bilirubin Urine: NEGATIVE
Blood, Urine: NEGATIVE
Glucose, UA: NEGATIVE mg/dL
Ketones, Urine: NEGATIVE mg/dL
Leukocyte Esterase, Urine: NEGATIVE
Nitrite, Urine: NEGATIVE
Protein, UA: NEGATIVE mg/dL
Specific Gravity, UA: 1.019 (ref 1.005–1.030)
Urobilinogen, Urine: 1 EU/dL (ref 0.2–1.0)
pH, Urine: 5.5 (ref 5.0–8.0)

## 2022-03-24 LAB — LACTIC ACID: Lactic Acid, Plasma: 0.8 MMOL/L (ref 0.4–2.0)

## 2022-03-24 LAB — MAGNESIUM: Magnesium: 2.3 mg/dL (ref 1.6–2.6)

## 2022-03-24 LAB — PHENYTOIN LEVEL, TOTAL: Phenytoin Lvl: 1.8 ug/mL — ABNORMAL LOW (ref 10.0–20.0)

## 2022-03-24 MED ORDER — POTASSIUM CHLORIDE 10 MEQ/100ML IV SOLN
10 MEQ/0ML | INTRAVENOUS | Status: AC | PRN
Start: 2022-03-24 — End: 2022-03-25

## 2022-03-24 MED ORDER — PHENYTOIN SODIUM EXTENDED 100 MG PO CAPS
100 MG | ORAL | Status: AC
Start: 2022-03-24 — End: 2022-03-24
  Administered 2022-03-24: 16:00:00 100 mg via ORAL

## 2022-03-24 MED ORDER — ACETAMINOPHEN 650 MG RE SUPP
650 | Freq: Four times a day (QID) | RECTAL | Status: DC | PRN
Start: 2022-03-24 — End: 2022-03-25

## 2022-03-24 MED ORDER — LEVETIRACETAM 500 MG PO TABS
500 MG | Freq: Two times a day (BID) | ORAL | Status: AC
Start: 2022-03-24 — End: 2022-03-25
  Administered 2022-03-25 (×2): 1500 mg via ORAL

## 2022-03-24 MED ORDER — LAMOTRIGINE 100 MG PO TABS
100 MG | Freq: Two times a day (BID) | ORAL | Status: AC
Start: 2022-03-24 — End: 2022-03-25
  Administered 2022-03-24 – 2022-03-25 (×3): 200 mg via ORAL

## 2022-03-24 MED ORDER — LEVETIRACETAM 500 MG PO TABS
500 MG | Freq: Two times a day (BID) | ORAL | Status: DC
Start: 2022-03-24 — End: 2022-03-24

## 2022-03-24 MED ORDER — MAGNESIUM SULFATE 2000 MG/50 ML IVPB PREMIX
2 GM/50ML | INTRAVENOUS | Status: AC | PRN
Start: 2022-03-24 — End: 2022-03-25

## 2022-03-24 MED ORDER — ACETAMINOPHEN 325 MG PO TABS
325 | Freq: Four times a day (QID) | ORAL | Status: DC | PRN
Start: 2022-03-24 — End: 2022-03-25

## 2022-03-24 MED ORDER — PHENYTOIN SODIUM EXTENDED 100 MG PO CAPS
100 MG | Freq: Once | ORAL | Status: AC
Start: 2022-03-24 — End: 2022-03-24
  Administered 2022-03-24: 21:00:00 1200 mg via ORAL

## 2022-03-24 MED ORDER — ONDANSETRON 4 MG PO TBDP
4 MG | Freq: Three times a day (TID) | ORAL | Status: AC | PRN
Start: 2022-03-24 — End: 2022-03-25

## 2022-03-24 MED ORDER — PHENYTOIN 50 MG PO CHEW
50 MG | Freq: Three times a day (TID) | ORAL | Status: AC
Start: 2022-03-24 — End: 2022-03-25
  Administered 2022-03-24 – 2022-03-25 (×3): 100 mg via ORAL

## 2022-03-24 MED ORDER — ARIPIPRAZOLE 10 MG PO TABS
10 MG | Freq: Every day | ORAL | Status: AC
Start: 2022-03-24 — End: 2022-03-25
  Administered 2022-03-24 – 2022-03-25 (×2): 15 mg via ORAL

## 2022-03-24 MED ORDER — LEVETIRACETAM 500 MG/5ML IV SOLN
500 MG/5ML | Freq: Once | INTRAVENOUS | Status: AC
Start: 2022-03-24 — End: 2022-03-24
  Administered 2022-03-24: 13:00:00 1500 mg via INTRAVENOUS

## 2022-03-24 MED ORDER — ONDANSETRON HCL 4 MG/2ML IJ SOLN
4 MG/2ML | Freq: Four times a day (QID) | INTRAMUSCULAR | Status: AC | PRN
Start: 2022-03-24 — End: 2022-03-25

## 2022-03-24 MED ORDER — ENOXAPARIN SODIUM 40 MG/0.4ML IJ SOSY
40 MG/0.4ML | Freq: Every day | INTRAMUSCULAR | Status: AC
Start: 2022-03-24 — End: 2022-03-25
  Administered 2022-03-25: 13:00:00 40 mg via SUBCUTANEOUS

## 2022-03-24 MED ORDER — SODIUM CHLORIDE 0.9 % IV SOLN
0.9 % | INTRAVENOUS | Status: AC
Start: 2022-03-24 — End: 2022-03-25
  Administered 2022-03-24: 17:00:00 via INTRAVENOUS

## 2022-03-24 MED ORDER — DOXEPIN HCL 25 MG PO CAPS
25 MG | Freq: Every evening | ORAL | Status: AC
Start: 2022-03-24 — End: 2022-03-25

## 2022-03-24 MED ORDER — POLYETHYLENE GLYCOL 3350 17 G PO PACK
17 g | Freq: Every day | ORAL | Status: AC | PRN
Start: 2022-03-24 — End: 2022-03-25

## 2022-03-24 MED FILL — PHENYTOIN 50 MG PO CHEW: 50 MG | ORAL | Qty: 2

## 2022-03-24 MED FILL — DOXEPIN HCL 25 MG PO CAPS: 25 MG | ORAL | Qty: 1

## 2022-03-24 MED FILL — ARIPIPRAZOLE 5 MG PO TABS: 5 MG | ORAL | Qty: 1

## 2022-03-24 MED FILL — ARIPIPRAZOLE 15 MG PO TABS: 15 MG | ORAL | Qty: 1

## 2022-03-24 MED FILL — LEVETIRACETAM 500 MG/5ML IV SOLN: 500 MG/5ML | INTRAVENOUS | Qty: 15

## 2022-03-24 MED FILL — LAMOTRIGINE 100 MG PO TABS: 100 MG | ORAL | Qty: 2

## 2022-03-24 MED FILL — PHENYTOIN SODIUM EXTENDED 100 MG PO CAPS: 100 MG | ORAL | Qty: 12

## 2022-03-24 MED FILL — PHENYTOIN SODIUM EXTENDED 100 MG PO CAPS: 100 MG | ORAL | Qty: 1

## 2022-03-24 MED FILL — SODIUM CHLORIDE 0.9 % IV SOLN: 0.9 % | INTRAVENOUS | Qty: 1000

## 2022-03-24 NOTE — Progress Notes (Addendum)
Lost IV access on patient. Patient is currently refusing to allow nursing to place another IV. Dr. Threasa Heads messaged to notify. Dr. Threasa Heads okay with no IV access.

## 2022-03-24 NOTE — H&P (Signed)
Hospitalist Admission History and Physical    NAME:  [name]   DOB:   12/15/1985   MRN:   [id number]     PCP:  No primary care provider on file.  Date/Time:  03/24/2022 11:57 AM  Subjective:   CHIEF COMPLAINT: Recurrent seizures at home    HISTORY OF PRESENT ILLNESS:     [name] is a 36 y.o.  African American male who presents with with a history of traumatic brain injury seizure disorder polysubstance abuse bipolar disorder major depression patient has been having increased seizures at home as neurologist told him to increase Keppra to 1500 twice daily Dilantin 100 3 times daily.  But patient has not been very compliant with his medication he has been missing his dose of Dilantin and Keppra.  Subsequently he will be his has been having more seizures had few more seizures today and yesterday.    Patient was seen in the ER and ED doctor talk to neurologist who recommended admitting the patient and he will see the patient for further work-up and treatment.    Patient's UDS was positive for opioids and THC ; UA with no UTI.    Head CT with;    IMPRESSION:     No acute findings or change to prior study.  Chronic right encephalomalacia and postsurgical changes.      No past medical history on file.     No past surgical history on file.    Social History     Tobacco Use    Smoking status: Every Day     Packs/day: 1.00     Types: Cigarettes    Smokeless tobacco: Not on file   Substance Use Topics    Alcohol use: Never        Family History   Family history unknown: Yes        Allergies   Allergen Reactions    Iodine     Nitrous Oxide     Shellfish Allergy     Succinylcholine         Prior to Admission Medications   Prescriptions Last Dose Informant Patient Reported? Taking?   ARIPiprazole (ABILIFY) 15 MG tablet   No No   Sig: Take 1 tablet by mouth daily   doxepin (SINEQUAN) 25 MG capsule   No No   Sig: Take 1 capsule by mouth nightly   lamoTRIgine (LAMICTAL) 200 MG tablet   No No   Sig: Take 1 tablet by  mouth 2 times daily   levETIRAcetam (KEPPRA) 1000 MG tablet   No No   Sig: Take 1 tablet by mouth 2 times daily      Facility-Administered Medications: None       REVIEW OF SYSTEMS:     []  Unable to obtain  ROS due to  [] mental status change  [] sedated   [] intubated   [x] Total of 12 systems reviewed as follows:  Constitutional:  negative fever, negative chills, negative weight loss  Eyes:   negative visual changes  ENT:   negative sore throat, tongue or lip swelling  Respiratory:  negative cough, negative dyspnea  Cards:   negative for chest pain, palpitations, lower extremity edema  GI:   negative for nausea, vomiting, diarrhea, and abdominal pain  Genitourinary: negative for frequency, dysuria  Integument:  negative for rash and pruritus  Hematologic:  negative for easy bruising and gum/nose bleeding  Musculoskel: negative for myalgias,  back pain and muscle weakness  Neurological:  negative for headaches,  dizziness, vertigo    Pertinent Positives include : Positive for seizures major depression    Objective:   VITALS:    BP 133/70   Pulse 63   Temp 98.2 F (36.8 C) (Oral)   Resp 22   Ht 5\' 10"  (1.778 m)   Wt 216 lb (98 kg)   SpO2 94%   BMI 30.99 kg/m   Temp (24hrs), Avg:98.2 F (36.8 C), Min:98.2 F (36.8 C), Max:98.2 F (36.8 C)      PHYSICAL EXAM:   General:    Alert, cooperative, no distress, appears stated age.       Head:   Normocephalic, without obvious abnormality, atraumatic.    Eyes:   Conjunctivae clear, anicteric sclerae.  Pupils are equal      Throat:    Lips, mucosa, and tongue normal.  No Thrush    Neck:  Supple, symmetrical,  no adenopathy, thyroid: non tender    no carotid bruit and no JVD.    Back:    Symmetric,  No CVA tenderness.    Lungs:   Clear to auscultation bilaterally.  No Wheezing or Rhonchi. No rales.    Chest wall:  No tenderness or deformity. No Accessory muscle use.    Heart:   Regular rate and rhythm,  no murmur, rub or gallop.    Abdomen:   Soft, non-tender. Not  distended.  Bowel sounds normal. No masses    Extremities: Extremities normal, atraumatic, No cyanosis.  No edema. No clubbing    Skin:     Texture, turgor normal. No rashes or lesions.  Not Jaundiced      Psych:  Good insight.  Not depressed.  Not anxious or agitated.    Neurologic: EOMs intact. No facial asymmetry. No aphasia or slurred speech. Normal   strength, Alert and oriented X 3.       LAB DATA REVIEWED:    No components found for: [facility]  Recent Labs     03/24/22  0837   NA 142   K 3.5   CL 111   CO2 28   BUN 12   WBC 7.4   HGB 13.6   HCT 39.5   PLT 217       Assessment/Plan:      Principal Problem:    Seizure (HCC)  Active Problems:    Polysubstance overdose    Bipolar disorder, unspecified (HCC)    History of craniotomy    Symptomatic generalized epilepsy (HCC)    Marijuana use    TBI (traumatic brain injury) (HCC)    Borderline personality disorder (HCC)    Major depressive disorder without psychotic features    Recurrent seizures (HCC)  Resolved Problems:    * No resolved hospital problems. *     ___________________________________________________  PLAN:    Risk of deterioration:  [] Low    [x] Moderate  [] High    Plan  Patient got IV Keppra and Dilantin in the ED.  We will continue Keppra 1500 mg twice daily and Dilantin 100 3 times daily.  Also resume his Lamictal 200 twice daily    Also will resume his Abilify and doxepin for his bipolar and depression.    Check EEG and a chest x-ray.        Prophylaxis:  [x] Lovenox  [] Coumadin  [] Hep SQ  [] SCD's  [] H2B/PPI    Disposition:  [x] Home w/ Family   [] HH PT,OT,RN   [] SNF/LTC   [] SAH/Rehab    Discussed Code Status:    [  x]Full Code      [] DNR     ___________________________________________________    Care Plan discussed with:    [x] Patient   [] Family    [] ED Care Manager  [x] ED Doc   [] Specialist :    Total Time Coordinating Admission:      minutes    [] Total Critical Care Time:     ___________________________________________________  Admitting  Physician: , MD

## 2022-03-24 NOTE — ED Triage Notes (Signed)
Pt reports I had to 2 seizures. States it has been awhile since he has seen a neurologist or had labs drawn. Appointment is 1 month out.,

## 2022-03-24 NOTE — Progress Notes (Signed)
Pt states that he has Tonic Clonic Seizures.

## 2022-03-24 NOTE — Progress Notes (Signed)
Patient OTF to CT at this time.

## 2022-03-24 NOTE — Progress Notes (Signed)
Advance Care Planning     Advance Care Planning Inpatient Note  Spiritual Care Department    Today's Date: 03/24/2022  Unit: [facility]    Received request from [employer].      Health Care Decision Makers:       Secondary Decision Maker: Levert Feinstein - [phone]  Summary:  Verified Documents      Advance Care Planning Documents (Patient Wishes):  Healthcare Power of Attorney/Advance Directive Appointment of Health Care Agent     Assessment:       03/24/22 1754   Encounter Summary   Encounter Overview/Reason  Initial Encounter   Referral/Consult From: Rounding   Support System Family members   Last Encounter  03/24/22  (IV-SA-KP)   Complexity of Encounter Moderate   Begin Time 1730   End Time  1736   Total Time Calculated 6 min   Spiritual/Emotional needs   Type Spiritual Support   Advance Care Planning   Type Care Preferences Addressed   Assessment/Intervention/Outcome   Assessment Sad;Coping   Intervention Active listening   Outcome Encouraged   Plan and Referrals   Plan/Referrals Continue to visit, (comment)         Interventions:  Reviewed but did not complete ACP document    Care Preferences Communicated:   No    Outcomes/Plan:  ACP Discussion: Completed    Electronically signed by Eilene Ghazi, BCC on 03/24/2022 at 5:56 PM

## 2022-03-24 NOTE — Progress Notes (Signed)
Assumed care of patient at this time. Patient noted resting on stretcher connected to monitors and VSS. Patient states that he suffered from 2 seizures this morning. Pt states that he had a total of 7 seizures this month. Care ongoing.

## 2022-03-24 NOTE — Progress Notes (Signed)
4 Eyes Skin Assessment     NAME:  [name]  DATE OF BIRTH:  08/13/1985  MEDICAL RECORD NUMBER:  [id number]    The patient is being assessed for  Admission    I agree that at least one RN has performed a thorough Head to Toe Skin Assessment on the patient. ALL assessment sites listed below have been assessed.      Areas assessed by both nurses:    Head, Face, Ears, Shoulders, Back, Chest, Arms, Elbows, Hands, Sacrum. Buttock, Coccyx, Ischium, and Legs. Feet and Heels        Does the Patient have a Wound? No noted wound(s)       Braden Prevention initiated by RN: No  Wound Care Orders initiated by RN: No    Pressure Injury (Stage 3,4, Unstageable, DTI, NWPT, and Complex wounds) if present, place Wound referral order by RN under ORDER ENTRY: No    New Ostomies, if present place, Ostomy referral order under ORDER ENTRY: No     Nurse 1 eSignature: Electronically signed by Herbie Saxon, RN on 03/24/22 at 6:03 PM EDT    **SHARE this note so that the co-signing nurse can place an eSignature**    Nurse 2 eSignature: Doreene Adas, RN
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
## 2022-03-24 NOTE — ED Notes (Signed)
Pt stated that he had a seizure about an hour ago.    Pt is taking Phenytoin, Lamictal and Keppra and is compliant as stated.     Guadelupe Sabin, [place]  03/24/22 [phone]

## 2022-03-24 NOTE — Progress Notes (Signed)
Report given to RN [place] for patient going to room 458
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
## 2022-03-25 LAB — PHOSPHORUS: Phosphorus: 3.3 MG/DL (ref 2.5–4.9)

## 2022-03-25 LAB — CBC WITH AUTO DIFFERENTIAL
Absolute Immature Granulocyte: 0 10*3/uL (ref 0.00–0.04)
Basophils %: 1 % (ref 0–2)
Basophils Absolute: 0 10*3/uL (ref 0.0–0.1)
Eosinophils %: 4 % (ref 0–5)
Eosinophils Absolute: 0.2 10*3/uL (ref 0.0–0.4)
Hematocrit: 42.8 % (ref 36.0–48.0)
Hemoglobin: 14.3 g/dL (ref 13.0–16.0)
Immature Granulocytes: 0 % (ref 0.0–0.5)
Lymphocytes %: 46 % (ref 21–52)
Lymphocytes Absolute: 2.6 10*3/uL (ref 0.9–3.6)
MCH: 29.9 PG (ref 24.0–34.0)
MCHC: 33.4 g/dL (ref 31.0–37.0)
MCV: 89.4 FL (ref 78.0–100.0)
MPV: 11.5 FL (ref 9.2–11.8)
Monocytes %: 9 % (ref 3–10)
Monocytes Absolute: 0.5 10*3/uL (ref 0.05–1.2)
Neutrophils %: 41 % (ref 40–73)
Neutrophils Absolute: 2.3 10*3/uL (ref 1.8–8.0)
Nucleated RBCs: 0 PER 100 WBC
Platelets: 216 10*3/uL (ref 135–420)
RBC: 4.79 M/uL (ref 4.35–5.65)
RDW: 12.5 % (ref 11.6–14.5)
WBC: 5.7 10*3/uL (ref 4.6–13.2)
nRBC: 0 10*3/uL (ref 0.00–0.01)

## 2022-03-25 LAB — COMPREHENSIVE METABOLIC PANEL W/ REFLEX TO MG FOR LOW K
ALT: 34 U/L (ref 16–61)
AST: 20 U/L (ref 10–38)
Albumin/Globulin Ratio: 1 (ref 0.8–1.7)
Albumin: 3.3 g/dL — ABNORMAL LOW (ref 3.4–5.0)
Alk Phosphatase: 79 U/L (ref 45–117)
Anion Gap: 6 mmol/L (ref 3.0–18)
BUN: 9 MG/DL (ref 7.0–18)
Bun/Cre Ratio: 10 — ABNORMAL LOW (ref 12–20)
CO2: 26 mmol/L (ref 21–32)
Calcium: 9 MG/DL (ref 8.5–10.1)
Chloride: 109 mmol/L (ref 100–111)
Creatinine: 0.9 MG/DL (ref 0.6–1.3)
Est, Glom Filt Rate: >60 mL/min/{1.73_m2} (ref >60)
Globulin: 3.2 g/dL (ref 2.0–4.0)
Glucose: 109 mg/dL — ABNORMAL HIGH (ref 74–99)
Potassium: 3.8 mmol/L (ref 3.5–5.5)
Sodium: 141 mmol/L (ref 136–145)
Total Bilirubin: 0.2 MG/DL (ref 0.2–1.0)
Total Protein: 6.5 g/dL (ref 6.4–8.2)

## 2022-03-25 MED ORDER — LEVETIRACETAM 750 MG PO TABS
750 MG | ORAL_TABLET | Freq: Two times a day (BID) | ORAL | 0 refills | Status: AC
Start: 2022-03-25 — End: ?

## 2022-03-25 MED ORDER — PHENYTOIN SODIUM EXTENDED 100 MG PO CAPS
100 MG | ORAL_CAPSULE | Freq: Every day | ORAL | 0 refills | Status: AC
Start: 2022-03-25 — End: ?

## 2022-03-25 MED FILL — LOVENOX 40 MG/0.4ML IJ SOSY: 40 MG/0.4ML | INTRAMUSCULAR | Qty: 0.4

## 2022-03-25 MED FILL — PHENYTOIN 50 MG PO CHEW: 50 MG | ORAL | Qty: 2

## 2022-03-25 MED FILL — LAMOTRIGINE 100 MG PO TABS: 100 MG | ORAL | Qty: 2

## 2022-03-25 MED FILL — DOXEPIN HCL 25 MG PO CAPS: 25 MG | ORAL | Qty: 1

## 2022-03-25 MED FILL — LEVETIRACETAM 500 MG PO TABS: 500 MG | ORAL | Qty: 3

## 2022-03-25 MED FILL — ARIPIPRAZOLE 5 MG PO TABS: 5 MG | ORAL | Qty: 1

## 2022-03-25 NOTE — Progress Notes (Signed)
Discharge order noted for today. Orders received. No needs identified at this time. Case management remains available as needed.    Bus ticket provided to patient for transportation home.    [facility] RN [facility]  Case Management

## 2022-03-25 NOTE — Progress Notes (Signed)
Patient discharged home all lines removed. Education provided patient expresses understanding. Vital signs WDL all scripts received

## 2022-03-25 NOTE — Progress Notes (Addendum)
Epilepsy/depression/non-compliance?/inadequate data base    S: He feels better today    O: He is more alert.     A: Ready for discharge.  He received a loading dose of phenytoin yesterday.  It is unclear to me if he having seizures because of chronically low AED levels or if some of his spells are non convulsive and related to depression or anxiety.     Rec: He should continue with Dilantin ER 300 mg once per day, Keppra 1500 BID, and lamotrigine 200 BID. He should see an outpatient neurologist in two weeks or less and have a trough Dilantin level drawn at the same time. He does not drive.     I cancelled the EEG--not useful.  Long term outpatient f/u much more important.

## 2022-03-25 NOTE — Plan of Care (Signed)
Problem: Safety - Adult  Goal: Free from fall injury  Outcome: Progressing     Problem: Pain  Goal: Verbalizes/displays adequate comfort level or baseline comfort level  Outcome: Progressing  Flowsheets (Taken 03/24/2022 1313 by Herbie Saxon, RN)  Verbalizes/displays adequate comfort level or baseline comfort level:   Encourage patient to monitor pain and request assistance   Assess pain using appropriate pain scale   Administer analgesics based on type and severity of pain and evaluate response

## 2022-03-25 NOTE — Care Coordination-Inpatient (Signed)
Case Management Assessment  Initial Evaluation    Date/Time of Evaluation: 03/25/2022 11:41 AM  Assessment Completed by: Janae Bridgeman, RN    If patient is discharged prior to next notation, then this note serves as note for discharge by case management.    Patient Name: [name]                   Date of Birth: 11-25-1985  Diagnosis: Recurrent seizures (HCC) [G40.909]  Seizures (HCC) [R56.9]                   Date / Time: 03/24/2022  7:51 AM    Patient Admission Status: Observation   Readmission Risk (Low < 19, Mod (19-27), High > 27): Readmission Risk Score: 12.9    Current PCP: No primary care provider on file.  PCP verified by CM? (P) No (pt states he currently has no PCP)    Chart Reviewed: Yes      History Provided by: (P) Patient  Patient Orientation: (P) Alert and Oriented    Patient Cognition: (P) Alert    Hospitalization in the last 30 days (Readmission):  No    If yes, Readmission Assessment in CM Navigator will be completed.    Advance Directives:      Code Status: Full Code   Patient's Primary Decision Maker is:      Secondary Decision Maker: [place],Ashlee - Unknown [phone]    Discharge Planning:    Patient lives with: (P) Spouse/Significant Other Type of Home: (P) House  Primary Care Giver: (P) Self  Patient Support Systems include: (P) Spouse/Significant Other, Friends/Neighbors, Family Members   Current Financial resources: (P) Medicaid  Current community resources:    Current services prior to admission: (P) None            Current DME:              Type of Home Care services:  (P) None    ADLS  Prior functional level: (P) Independent in ADLs/IADLs  Current functional level: (P) Independent in ADLs/IADLs    PT AM-PAC:   /24  OT AM-PAC:   /24    Family can provide assistance at DC: (P) Yes  Would you like Case Management to discuss the discharge plan with any other family members/significant others, and if so, who? (P) Yes (husband)  Plans to Return to Present Housing: (P) Yes  Other  Identified Issues/Barriers to RETURNING to current housing: none noted  Potential Assistance needed at discharge: (P) N/A            Potential DME:    Patient expects to discharge to: (P) House  Plan for transportation at discharge: (P) Self    Financial    Payor: Advertising copywriter COMMUNITY PL / Plan: [facility] MEDICAID [place] / Product Type: *No Product type* /     Does insurance require precert for SNF: has Tourist information centre manager Medications: (P) No  Meds-to-Beds request:        [name] - [place], [place] - [address] - P [phone] - F [phone]  [address]  [place] [address]  Phone: [phone] Fax: [phone]    [name] - [place] - [address] - P [phone] [name] [phone]  [address] [place] [address]  Phone: [phone] Fax: [phone]    [name] - [place], [place] - [address] - P [phone] [name] [phone]  [address]  [place] [address]  Phone: [phone] Fax: [phone]    [name] - [name], [place] - [address] - P [phone] [name] [phone]  [address] [place]  [place] [address]  Phone: [phone] Fax: [phone]    [name] [address] [place], [place] - [address] - P [phone] [name] [phone]  [address]  [place] [address]  Phone: [phone] Fax: [phone]      Notes:    Factors facilitating achievement of predicted outcomes: Family support, Motivated, Cooperative, Pleasant, Sense of humor, and Good insight into deficits    Barriers to discharge: Medical complications, medication management    Additional Case Management Notes:     The Plan for Transition of Care is related to the following treatment goals of Recurrent seizures (HCC) [G40.909]  Seizures (HCC) [R56.9]    IF APPLICABLE: The Patient and/or patient  representative [name] and his family were provided with a choice of provider and agrees with the discharge plan. Freedom of choice list with basic dialogue that supports the patient's individualized plan of care/goals and shares the quality data associated with the providers was provided to:     Patient Representative Name:       The Patient and/or Patient Representative Agree with the Discharge Plan?      Janae Bridgeman, RN  Case Management Department  Ph: [phone]       03/25/22 1139   Service Assessment   Patient Orientation Alert and Oriented   Cognition Alert   History Provided By Patient   Primary Caregiver Self   Support Systems Spouse/Significant Other;Friends/Neighbors;Family Members   PCP Verified by CM No  (pt states he currently has no PCP)   Prior Functional Level Independent in ADLs/IADLs   Current Functional Level Independent in ADLs/IADLs   Can patient return to prior living arrangement Yes   Ability to make needs known: Good   Family able to assist with home care needs: Yes   Would you like for me to discuss the discharge plan with any other family members/significant others, and if so, who? 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 Other (comment)   Potential Assistance Purchasing Medications No   Type of Home Care Services None   Patient expects to be discharged to: House   One/Two Story Residence Two story   History of falls? 0   Services At/After Discharge   Transition of Care Consult (  CM Consult) N/A   Services At/After Discharge None   [employer] Information Provided? No   Mode of Transport at Discharge Self  (pt requesting a bus ticket home)   Confirm Follow Up Transport Self   Condition of Participation: Discharge Planning   The Plan for Transition of Care is related to the following treatment goals: home with family support

## 2022-03-25 NOTE — Discharge Summary (Cosign Needed)
[facility] Hospitalist [facility]    Discharge Summary    Patient: [name] MRN: [id number]  CSN: [id number]    Date of Birth: [birth year]  Age: 36 y.o.  Sex: male    DOA: 03/24/2022 LOS:  LOS: 0 days   Discharge Date: 03/25/2022     Admission Diagnoses: Recurrent seizures (HCC) [G40.909]  Seizures (HCC) [R56.9]    Discharge Diagnoses:    Hospital Problems             Last Modified POA    * (Principal) Seizure (HCC) 03/24/2022 Yes    Polysubstance overdose 03/24/2022 Yes    Bipolar disorder, unspecified (HCC) 03/24/2022 Yes    History of craniotomy 03/24/2022 Yes    Symptomatic generalized epilepsy (HCC) 03/24/2022 Yes    Marijuana use 03/24/2022 Yes    Seizures (HCC) 03/24/2022 Yes    TBI (traumatic brain injury) (HCC) 03/24/2022 Yes    Borderline personality disorder (HCC) 03/24/2022 Yes    Major depressive disorder without psychotic features 03/24/2022 Yes    Recurrent seizures (HCC) 03/24/2022 Yes       Discharge Condition: Stable    Discharge To: Home    Consults: Neurology    HPI: Per admitting provider, "Sidhanth is a 36 y.o.  African American male who presents with with a history of traumatic brain injury, seizure disorder, polysubstance abuse, bipolar disorder, major depression. patient has been having increased seizures at home as neurologist told him to increase Keppra to 1500 twice daily Dilantin 100 3 times daily.  But patient has not been very compliant with his medication he has been missing his dose of Dilantin and Keppra.  Subsequently he will be his has been having more seizures had few more seizures today and yesterday.     Patient was seen in the ER and ED doctor talk to neurologist who recommended admitting the patient and he will see the patient for further work-up and treatment.     Patient's UDS was positive for opioids and THC ; UA with no UTI."    Hospital Course: Neurology was consulted. Phenytoin level was subtherapeutic at 1.8. Lamotrigine level therapeutic at 4.1. He  was given a phenytoin loading dose. He had no further seizures and was cleared for discharge by neuro. Patient instructed to follow up with his primary neurologist and psychiatrist. Refills of recommended dosages of AEDs were sent to his pharmacy if he didn't have them at home.      Physical Exam:  General appearance: alert, cooperative, no distress, appears stated age  Head: Normocephalic, without obvious abnormality, atraumatic  Lungs: clear to auscultation bilaterally  Heart: regular rate and rhythm, S1, S2 normal, no murmur, click, rub or gallop  Abdomen: soft, non-tender. Bowel sounds normal. No masses,  no organomegaly  Extremities: no cyanosis or edema  Skin: Skin color, texture normal. No rashes or lesions  Neurologic: no focal deficits, motor/sensory intact  PSY: Mood and affect normal, appropriately behaved    Significant Diagnostic Studies:     No results found for this or any previous visit (from the past 24 hour(s)).    XR CHEST (2 VW)    Result Date: 03/24/2022  PA lateral CXR: HISTORY: Seizure. Comparison 02/07/2022 Cardiac silhouette and vascularity within normal limits. Lungs and costophrenic angles are clear. Mild eventration of right hemidiaphragm, stable.     No acute pulmonary disease.    CT HEAD WO CONTRAST (Select for new onset seizures or head trauma)    Result Date: 03/24/2022  CT OF THE HEAD WITHOUT CONTRAST. CLINICAL HISTORY: Seizures. TECHNIQUE: Helical scan obtained of the head were obtained from the skull vertex through the base of the skull without intravenous contrast.    All CT scans are performed using dose optimization techniques as appropriate to the performed exam including the following: Automated exposure control, adjustment of mA and/or kV according to patient size, and use of iterative reconstructive technique. COMPARISON: 05/02/2017. FINDINGS: Redemonstration of anterior right frontal lobe encephalomalacia. The sulcal pattern and ventricular system are otherwise normal in size  and configuration for age. Stable chronic hypodense linear tract at right parietal lobe. No new findings No intracranial hemorrhage. No mass effect. The visualized paranasal sinuses are clear. The mastoid air cells are clear. The visualized bony structures are without acute abnormality. Frontal bone surgical hardware.     No acute findings or change to prior study. Chronic right encephalomalacia and postsurgical changes.       Procedures: None    Discharge Medications:     Discharge Medication List as of 03/25/2022 11:22 AM        START taking these medications    Details   phenytoin (DILANTIN) 100 MG ER capsule Take 3 capsules by mouth daily, Disp-90 capsule, R-0Normal           CONTINUE these medications which have CHANGED    Details   levETIRAcetam (KEPPRA) 750 MG tablet Take 2 tablets by mouth 2 times daily, Disp-120 tablet, R-0Normal           CONTINUE these medications which have NOT CHANGED    Details   lamoTRIgine (LAMICTAL) 200 MG tablet Take 1 tablet by mouth 2 times daily, Disp-60 tablet, R-1Normal      doxepin (SINEQUAN) 25 MG capsule Take 1 capsule by mouth nightly, Disp-30 capsule, R-1Normal      ARIPiprazole (ABILIFY) 15 MG tablet Take 1 tablet by mouth daily, Disp-30 tablet, R-1Normal              Activity: activity as tolerated    Diet: regular diet    Wound Care: none needed    Follow-up:   Primary neurologist in 2 weeks, PCP in 1 week    Discharge time: >30 minutes spent coordinating this discharge    Ulyess Blossom, PA-C  [facility]  Hospitalist Division  Office:  [phone]      04/01/2022, 1:18 PM

## 2022-03-25 NOTE — Discharge Instructions (Signed)
DISCHARGE SUMMARY from Nurse    PATIENT INSTRUCTIONS:    After general anesthesia or intravenous sedation, for 24 hours or while taking prescription Narcotics:  Limit your activities  Do not drive and operate hazardous machinery  Do not make important personal or business decisions  Do  not drink alcoholic beverages  If you have not urinated within 8 hours after discharge, please contact your surgeon on call.    Report the following to your surgeon:  Excessive pain, swelling, redness or odor of or around the surgical area  Temperature over 100.5  Nausea and vomiting lasting longer than 4 hours or if unable to take medications  Any signs of decreased circulation or nerve impairment to extremity: change in color, persistent  numbness, tingling, coldness or increase pain  Any questions    What to do at Home:  Recommended activity: activity as tolerated,     If you experience any of the following symptoms chest pain, headache, pain unrelieved by medication, please follow up with Primary Care Provider or return to the emergency room.    *  Please give a list of your current medications to your Primary Care Provider.    *  Please update this list whenever your medications are discontinued, doses are      changed, or new medications (including over-the-counter products) are added.    *  Please carry medication information at all times in case of emergency situations.    These are general instructions for a healthy lifestyle:    No smoking/ No tobacco products/ Avoid exposure to second hand smoke  Surgeon General's Warning:  Quitting smoking now greatly reduces serious risk to your health.    Obesity, smoking, and sedentary lifestyle greatly increases your risk for illness    A healthy diet, regular physical exercise & weight monitoring are important for maintaining a healthy lifestyle    You may be retaining fluid if you have a history of heart failure or if you experience any of the following symptoms:  Weight gain of 3  pounds or more overnight or 5 pounds in a week, increased swelling in our hands or feet or shortness of breath while lying flat in bed.  Please call your doctor as soon as you notice any of these symptoms; do not wait until your next office visit.        The discharge information has been reviewed with the patient.  The patient verbalized understanding.  Discharge medications reviewed with the patient and appropriate educational materials and side effects teaching were provided.      Patient armband removed and shredded.    ___________________________________________________________________________________________________________________________________

## 2022-03-26 LAB — LAMOTRIGINE LEVEL: Lamotrigine: 4.1 ug/mL (ref 2.0–20.0)

## 2022-03-26 LAB — LEVETIRACETAM LEVEL: Levetiracetam: 26.9 ug/mL (ref 10.0–40.0)

## 2022-07-09 ENCOUNTER — Other Ambulatory Visit: Payer: Self-pay

## 2022-07-09 ENCOUNTER — Encounter (HOSPITAL_COMMUNITY): Payer: Self-pay | Admitting: Behavioral Health

## 2022-07-09 ENCOUNTER — Ambulatory Visit (HOSPITAL_COMMUNITY)
Admission: EM | Admit: 2022-07-09 | Discharge: 2022-07-09 | Disposition: A | Discharge status: Home / Self Care | Payer: Medicaid Other | Attending: Psychiatry | Admitting: Psychiatry

## 2022-07-09 ENCOUNTER — Inpatient Hospital Stay (HOSPITAL_COMMUNITY)
Admission: AD | Admit: 2022-07-09 | Discharge: 2022-07-15 | DRG: 885 | Disposition: A | Discharge status: Home / Self Care | Payer: Medicaid Other | Attending: Psychiatry | Admitting: Psychiatry

## 2022-07-09 DIAGNOSIS — F332 Major depressive disorder, recurrent severe without psychotic features: Principal | ICD-10-CM | POA: Diagnosis present

## 2022-07-09 DIAGNOSIS — Z79899 Other long term (current) drug therapy: Secondary | ICD-10-CM | POA: Diagnosis not present

## 2022-07-09 DIAGNOSIS — Z91013 Allergy to seafood: Secondary | ICD-10-CM

## 2022-07-09 DIAGNOSIS — S069XAA Unspecified intracranial injury with loss of consciousness status unknown, initial encounter: Secondary | ICD-10-CM | POA: Insufficient documentation

## 2022-07-09 DIAGNOSIS — F1421 Cocaine dependence, in remission: Secondary | ICD-10-CM | POA: Insufficient documentation

## 2022-07-09 DIAGNOSIS — F17210 Nicotine dependence, cigarettes, uncomplicated: Secondary | ICD-10-CM | POA: Diagnosis present

## 2022-07-09 DIAGNOSIS — F319 Bipolar disorder, unspecified: Secondary | ICD-10-CM | POA: Insufficient documentation

## 2022-07-09 DIAGNOSIS — F339 Major depressive disorder, recurrent, unspecified: Secondary | ICD-10-CM

## 2022-07-09 DIAGNOSIS — F1510 Other stimulant abuse, uncomplicated: Secondary | ICD-10-CM | POA: Diagnosis present

## 2022-07-09 DIAGNOSIS — R45851 Suicidal ideations: Secondary | ICD-10-CM | POA: Diagnosis present

## 2022-07-09 DIAGNOSIS — Z765 Malingerer [conscious simulation]: Secondary | ICD-10-CM

## 2022-07-09 DIAGNOSIS — Z9151 Personal history of suicidal behavior: Secondary | ICD-10-CM

## 2022-07-09 DIAGNOSIS — Z20822 Contact with and (suspected) exposure to covid-19: Secondary | ICD-10-CM | POA: Diagnosis present

## 2022-07-09 DIAGNOSIS — Z87820 Personal history of traumatic brain injury: Secondary | ICD-10-CM

## 2022-07-09 DIAGNOSIS — Z888 Allergy status to other drugs, medicaments and biological substances status: Secondary | ICD-10-CM | POA: Diagnosis not present

## 2022-07-09 DIAGNOSIS — Z8249 Family history of ischemic heart disease and other diseases of the circulatory system: Secondary | ICD-10-CM

## 2022-07-09 DIAGNOSIS — F411 Generalized anxiety disorder: Secondary | ICD-10-CM | POA: Diagnosis present

## 2022-07-09 DIAGNOSIS — I10 Essential (primary) hypertension: Secondary | ICD-10-CM | POA: Diagnosis present

## 2022-07-09 DIAGNOSIS — E785 Hyperlipidemia, unspecified: Secondary | ICD-10-CM | POA: Diagnosis present

## 2022-07-09 DIAGNOSIS — F1290 Cannabis use, unspecified, uncomplicated: Secondary | ICD-10-CM | POA: Insufficient documentation

## 2022-07-09 DIAGNOSIS — Z1152 Encounter for screening for COVID-19: Secondary | ICD-10-CM | POA: Insufficient documentation

## 2022-07-09 DIAGNOSIS — F1210 Cannabis abuse, uncomplicated: Secondary | ICD-10-CM | POA: Diagnosis present

## 2022-07-09 DIAGNOSIS — F1590 Other stimulant use, unspecified, uncomplicated: Secondary | ICD-10-CM | POA: Insufficient documentation

## 2022-07-09 DIAGNOSIS — F419 Anxiety disorder, unspecified: Secondary | ICD-10-CM | POA: Insufficient documentation

## 2022-07-09 LAB — LIPID PANEL
Cholesterol: 168 mg/dL (ref 0–200)
HDL: 31 mg/dL — ABNORMAL LOW (ref >40)
LDL Cholesterol: 109 mg/dL — ABNORMAL HIGH (ref 0–99)
Total CHOL/HDL Ratio: 5.4 RATIO
Triglycerides: 138 mg/dL (ref <150)
VLDL: 28 mg/dL (ref 0–40)

## 2022-07-09 LAB — COMPREHENSIVE METABOLIC PANEL
ALT: 49 U/L — ABNORMAL HIGH (ref 0–44)
AST: 30 U/L (ref 15–41)
Albumin: 4 g/dL (ref 3.5–5.0)
Alkaline Phosphatase: 75 U/L (ref 38–126)
Anion gap: 10 (ref 5–15)
BUN: 9 mg/dL (ref 6–20)
CO2: 27 mmol/L (ref 22–32)
Calcium: 9.8 mg/dL (ref 8.9–10.3)
Chloride: 105 mmol/L (ref 98–111)
Creatinine, Ser: 1.03 mg/dL (ref 0.61–1.24)
GFR, Estimated: >60 mL/min (ref >60)
Glucose, Bld: 99 mg/dL (ref 70–99)
Potassium: 3.5 mmol/L (ref 3.5–5.1)
Sodium: 142 mmol/L (ref 135–145)
Total Bilirubin: 0.3 mg/dL (ref 0.3–1.2)
Total Protein: 6.7 g/dL (ref 6.5–8.1)

## 2022-07-09 LAB — CBC WITH DIFFERENTIAL/PLATELET
Abs Immature Granulocytes: 0.04 10*3/uL (ref 0.00–0.07)
Basophils Absolute: 0.1 10*3/uL (ref 0.0–0.1)
Basophils Relative: 1 %
Eosinophils Absolute: 0.2 10*3/uL (ref 0.0–0.5)
Eosinophils Relative: 2 %
HCT: 40.8 % (ref 39.0–52.0)
Hemoglobin: 14.5 g/dL (ref 13.0–17.0)
Immature Granulocytes: 0 %
Lymphocytes Relative: 29 %
Lymphs Abs: 2.8 10*3/uL (ref 0.7–4.0)
MCH: 30.2 pg (ref 26.0–34.0)
MCHC: 35.5 g/dL (ref 30.0–36.0)
MCV: 85 fL (ref 80.0–100.0)
Monocytes Absolute: 0.7 10*3/uL (ref 0.1–1.0)
Monocytes Relative: 7 %
Neutro Abs: 5.9 10*3/uL (ref 1.7–7.7)
Neutrophils Relative %: 61 %
Platelets: 267 10*3/uL (ref 150–400)
RBC: 4.8 MIL/uL (ref 4.22–5.81)
RDW: 12.7 % (ref 11.5–15.5)
WBC: 9.7 10*3/uL (ref 4.0–10.5)
nRBC: 0 % (ref 0.0–0.2)

## 2022-07-09 LAB — POCT URINE DRUG SCREEN - MANUAL ENTRY (I-SCREEN)
POC Amphetamine UR: POSITIVE — AB
POC Buprenorphine (BUP): NOT DETECTED
POC Cocaine UR: POSITIVE — AB
POC Marijuana UR: POSITIVE — AB
POC Methadone UR: NOT DETECTED
POC Methamphetamine UR: POSITIVE — AB
POC Morphine: NOT DETECTED
POC Oxazepam (BZO): NOT DETECTED
POC Oxycodone UR: NOT DETECTED
POC Secobarbital (BAR): NOT DETECTED

## 2022-07-09 LAB — RESP PANEL BY RT-PCR (RSV, FLU A&B, COVID)  RVPGX2
Influenza A by PCR: NEGATIVE
Influenza B by PCR: NEGATIVE
Resp Syncytial Virus by PCR: NEGATIVE
SARS Coronavirus 2 by RT PCR: NEGATIVE

## 2022-07-09 LAB — TSH: TSH: 2.837 u[IU]/mL (ref 0.350–4.500)

## 2022-07-09 LAB — HEMOGLOBIN A1C
Hgb A1c MFr Bld: 5.5 % (ref 4.8–5.6)
Mean Plasma Glucose: 111 mg/dL

## 2022-07-09 MED ORDER — MAGNESIUM HYDROXIDE 400 MG/5ML PO SUSP: 30.0000 mL | Freq: Every day | ORAL | Status: DC | PRN

## 2022-07-09 MED ORDER — ALUM & MAG HYDROXIDE-SIMETH 200-200-20 MG/5ML PO SUSP: 30.0000 mL | ORAL | Status: DC | PRN

## 2022-07-09 MED ORDER — ACETAMINOPHEN 325 MG PO TABS: 650.0000 mg | ORAL_TABLET | Freq: Four times a day (QID) | ORAL | Status: DC | PRN

## 2022-07-09 MED ORDER — HYDROXYZINE HCL 25 MG PO TABS
25.0000 mg | ORAL_TABLET | Freq: Three times a day (TID) | ORAL | Status: DC | PRN
  Administered 2022-07-10 – 2022-07-15 (×7): 25 mg via ORAL
  Filled 2022-07-09 (×6): qty 1

## 2022-07-09 NOTE — H&P (Signed)
Behavioral Health Medical Screening Exam  Arthur Singleton is a 36 y.o. male with a history of major depressive disorder, pseudoseizure and suicidally who walked in voluntarily to [facility] due to having suicidal thoughts with plan to overdose.   Patient was seen face to face by this provider.   On evaluation patient is alert and oriented x4, speech is clear and coherent. Patient's eye contact is good, mood is depressed and  affect is appropriate. Patient's thought process is goal directed and thought content is WNL.  Patient reports having suicidal thoughts with a plan to overdose. Patient has a history of overdose 10 years ago. Patient did not contract for safety.   Patient denies HI, denies AVH, or paranoia. There is no indication that the patient is responding to internal stimuli and no delusion noted.    Patient reported that he is having some suicidal thoughts for the past few weeks and it is getting worse, he says he has a plan to overdose.  Patient said the last time he felt this way was 10 years ago when he actually overdosed and had to be at the hospital.  Patient stated " I wanna catch it before it gets to that point".  Patient reported that recently he has been feeling worthless, no interest to do what he loves to do, hopeless and having suicidal thoughts. 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 Patient denies using alcohol. [name] says that he was linked with a psychiatrist, but has not seen him in 10 years. He added that he was in a therapy session with a therapist named, Nanetta Batty but this was over 3 years ago. Patient reported that he was prescribed Prozac 40 mg and Lamictal but has not been taking them, patient states the last time he took his mediation was 6 months ago. 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  Patient reports that his sleep pattern is fair and appetite is good.   Patient  says that he lives alone. Patient denies having a gun or fire weapon at home.  Patient reported that he has history of seizures and the last time he has seizure was three years ago.   Support, encouragement and reassurance provided about ongoing stressors and patient provided with opportunity for questions.     Patient reports having suicidal thoughts with a plan to overdose, patient did not contract for safety. Will admit to [facility] for safety observation pending inpatient placement as there are no open beds at [facility]. .    Total Time spent with patient: 30 minutes  Psychiatric Specialty Exam:  Presentation  General Appearance:  Fairly Groomed  Eye Contact: Good  Speech: Clear and Coherent  Speech Volume: Normal  Handedness:No data recorded  Mood and Affect  Mood: Euthymic  Affect: Appropriate   Thought Process  Thought Processes: Coherent  Descriptions of Associations:Intact  Orientation:Full (Time, Place and Person)  Thought Content:WDL  History of Schizophrenia/Schizoaffective disorder:No data recorded Duration of Psychotic Symptoms:No data recorded Hallucinations:Hallucinations: None  Ideas of Reference:None  Suicidal Thoughts:Suicidal Thoughts: Yes, Active SI Active Intent and/or Plan: With Plan  Homicidal Thoughts:Homicidal Thoughts: No   Sensorium  Memory: Immediate Good; Recent Good; Remote Good  Judgment: Good  Insight: Good   Executive Functions  Concentration: Good  Attention Span: Good  Recall: Good  Fund of Knowledge: Good  Language: Good   Psychomotor Activity  Psychomotor Activity: Psychomotor Activity: Normal  Assets  Assets: Armed forces logistics/support/administrative officer; Physical Health; Resilience   Sleep  Sleep: Sleep: Fair    Physical Exam: Physical Exam HENT:     Nose: No congestion.  Eyes:     General:        Right eye: No discharge.        Left eye: No discharge.  Cardiovascular:     Rate and Rhythm: Normal  rate.  Pulmonary:     Breath sounds: No wheezing.  Musculoskeletal:        General: No signs of injury.  Skin:    General: Skin is warm and dry.  Neurological:     Mental Status: He is alert.  Psychiatric:        Attention and Perception: He does not perceive auditory or visual hallucinations.        Speech: Speech normal.        Thought Content: Thought content is not paranoid or delusional. Thought content includes suicidal ideation. Thought content does not include homicidal ideation. Thought content includes suicidal plan. Thought content does not include homicidal plan.    Review of Systems  Constitutional:  Negative for diaphoresis and fever.  HENT:  Negative for hearing loss.   Eyes:  Negative for discharge and redness.  Respiratory:  Negative for cough, shortness of breath and wheezing.   Cardiovascular:  Negative for chest pain.  Gastrointestinal:  Negative for nausea and vomiting.  Neurological:  Negative for dizziness, seizures, weakness and headaches.  Psychiatric/Behavioral:  Positive for depression, substance abuse and suicidal ideas.    Blood pressure (!) 136/95, pulse 77, temperature 98 F (36.7 C), temperature source Oral, SpO2 100 %. There is no height or weight on file to calculate BMI.  Musculoskeletal: Strength & Muscle Tone: within normal limits Gait & Station: normal Patient leans: N/A  [place] Scale:  Flowsheet Row OP Visit from 07/09/2022 in [place] CATEGORY High Risk     Recommendations:  Based on my evaluation the patient does not appear to have an emergency medical condition.  Recommends inpatient psychiatric  admission. Pending inpatient admission, patient will be transferred to [facility] for safety observation as patient is unable to contract for safety.   Earney Mallet, NP 07/09/2022, 12:49 AM

## 2022-07-09 NOTE — Progress Notes (Signed)
Pt was accepted to [facility] TODAY 07/09/2022. Bed assignment: 405-2  Pt meets inpatient criteria per Liborio Nixon, NP.  Attending Physician will be Phineas Inches, MD   Report can be called to: -Adult unit: [phone]  Pt can arrive after 2 PM  Care Team Notified: [facility], RN, Laretta Alstrom, LPN, Bedelia Person, RN, Liborio Nixon, NP, and [address] LCSWA  [place], [place]  07/09/2022 12:19 PM

## 2022-07-09 NOTE — ED Notes (Signed)
[facility] called for stat pickup   

## 2022-07-09 NOTE — ED Notes (Signed)
Pt given back belongings. He was escorted to [facility] and left with safe transport.  No distress noted.

## 2022-07-09 NOTE — ED Notes (Signed)
Pt refused breakfast 

## 2022-07-09 NOTE — Progress Notes (Addendum)
Attempted to call back [facility] RN back for report but RN was not available at the moment.

## 2022-07-09 NOTE — ED Provider Notes (Signed)
[facility] Continuous Assessment Admission H&P  Date: 07/09/22 Patient Name: Arthur Singleton MRN: [id number] Chief Complaint: No chief complaint on file.     Diagnoses:  Final diagnoses:  Suicidal ideation  Malingering  Recurrent major depressive disorder, remission status unspecified (HCC)    HPI: Arthur Singleton,  36 y.o male with a psychiatric history of OD, bipolar disorder, malingering, drug seeking behaviors.  Presented to [facility] as a transfer from [facility].  Per the patient he is depressed and suicidal with plans to OD on medicines when asked if he has medicines patient stated he will find it where he can find it.  At first patient did not want to answer any questions because he stated he was already asked questions at the first hospital. Patient seemed to be a little bit demanding, and manipulative.  According to patient he was last hospitalized 10 years ago for suicidal ideation.  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  See [facility] notes  Face-to-face observation of patient, patient is alert and oriented x 4, speech is clear.  Patient endorsed suicidal ideations with plans to overdose on medicines stating he will find the medicines wherever he can.  Patient denies alcohol use, patient report he smokes cigarettes.  Patient reports he gets 4-6 5 hours of sleep each night.  According to patient he is not currently seeing a psychiatrist or therapist.  Patient stated when he was a couple years ago he was prescribed Prozac but he does not take that medicine anymore.  Patient does not seem to be forthcoming with his answers, patient seemed to be demanding and manipulative.   Recommend observation   PHQ 2-9:   Flowsheet Row OP Visit from 07/09/2022 [facility]  C-SSRS RISK CATEGORY High Risk        Total Time spent with patient: 20 minutes  Musculoskeletal  Strength & Muscle Tone: within normal limits Gait &  Station: normal Patient leans: N/A  Psychiatric Specialty Exam  Presentation General Appearance:  Casual  Eye Contact: Fair  Speech: Clear and Coherent  Speech Volume: Normal  Handedness: Right   Mood and Affect  Mood: Depressed  Affect: Appropriate   Thought Process  Thought Processes: Coherent  Descriptions of Associations:Intact  Orientation:Full (Time, Place and Person)  Thought Content:WDL    Hallucinations:Hallucinations: None  Ideas of Reference:None  Suicidal Thoughts:Suicidal Thoughts: Yes, Active SI Active Intent and/or Plan: With Plan  Homicidal Thoughts:Homicidal Thoughts: No   Sensorium  Memory: Immediate Good  Judgment: Good  Insight: Good   Executive Functions  Concentration: Good  Attention Span: Good  Recall: Good  Fund of Knowledge: Good  Language: Good   Psychomotor Activity  Psychomotor Activity: Psychomotor Activity: Normal   Assets  Assets: Communication Skills   Sleep  Sleep: Sleep: Fair Number of Hours of Sleep: 6   Nutritional Assessment (For OBS and FBC admissions only) Has the patient had a weight loss or gain of 10 pounds or more in the last 3 months?: No Has the patient had a decrease in food intake/or appetite?: No Does the patient have dental problems?: No Does the patient have eating habits or behaviors that may be indicators of an eating disorder including binging or inducing vomiting?: No Has the patient recently lost weight without trying?: 0 Has the patient been eating poorly because of a decreased appetite?: 0 Malnutrition Screening Tool Score: 0    Physical Exam HENT:     Head: Normocephalic.  Nose: Nose normal.  Cardiovascular:     Rate and Rhythm: Normal rate.  Musculoskeletal:        General: Normal range of motion.     Cervical back: Normal range of motion.  Neurological:     General: No focal deficit present.     Mental Status: He is alert.  Psychiatric:         Mood and Affect: Mood normal.        Behavior: Behavior normal.        Thought Content: Thought content normal.        Judgment: Judgment normal.    Review of Systems  Constitutional: Negative.   HENT: Negative.    Eyes: Negative.   Respiratory: Negative.    Cardiovascular: Negative.   Skin: Negative.   Neurological: Negative.   Endo/Heme/Allergies: Negative.   Psychiatric/Behavioral:  Positive for depression and suicidal ideas. The patient is nervous/anxious.     Blood pressure (!) 140/95, pulse 74, temperature 98.1 F (36.7 C), temperature source Oral, resp. rate 18, SpO2 98 %. There is no height or weight on file to calculate BMI.  Past Psychiatric History: OD, bipolar disorder, malingering, drug seeking behaviors  Is the patient at risk to self? Yes  Has the patient been a risk to self in the past 6 months? Yes .    Has the patient been a risk to self within the distant past? Yes   Is the patient a risk to others? No   Has the patient been a risk to others in the past 6 months? No   Has the patient been a risk to others within the distant past? 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   Pseudoseizures (HCC)    questioned after normal EEG 08/2011, staging "seizure" to obtain narcotic pain meds   Seizures (HCC)    Traumatic brain injury [facility](HCC)     Past Surgical History:  Procedure Laterality Date   BRAIN SURGERY     partial frontal lobectomy   LOBECTOMY     right arm growth plate      Family History:  Family History  Problem Relation Age of Onset   COPD Mother    Coronary artery disease Mother    Stroke Mother    Diabetes Mother    Diabetes Father      Social History:  Social History   Socioeconomic History   Marital status: Single    Spouse name: Not on file   Number of children: Not on file   Years of education: Not on file   Highest education level: Not on file  Occupational History   Not on file  Tobacco Use   Smoking status: Every Day    Packs/day: 1.00    Types: Cigarettes   Smokeless tobacco: Not on file  Substance and Sexual Activity   Alcohol use: No   Drug use: Yes    Types: Marijuana   Sexual activity: Yes    Birth control/protection: Condom  Other Topics Concern   Not on file  Social History Narrative   ** Merged History Encounter **  Social Determinants of Health   Financial Resource Strain: Not on file  Food Insecurity: Not on file  Transportation Needs: Not on file  Physical Activity: Not on file  Stress: Not on file  Social Connections: Not on file  Intimate Partner Violence: Not on file    SDOH:  SDOH Screenings   Tobacco Use: High Risk (08/09/2020)    Last Labs:  Hospital Outpatient Visit on 07/09/2022  Component Date Value Ref Range Status   SARS Coronavirus 2 by RT PCR 07/09/2022 NEGATIVE  NEGATIVE Final   Comment: (NOTE) SARS-CoV-2 target nucleic acids are NOT DETECTED.  The SARS-CoV-2 RNA is generally detectable in upper respiratory specimens during the acute phase of infection. The lowest concentration of SARS-CoV-2 viral copies this assay can detect is 138 copies/mL. A negative result does not preclude SARS-Cov-2 infection and should not be used as the sole basis for treatment or other patient management decisions. A negative result may occur with  improper specimen collection/handling, submission of specimen other than nasopharyngeal swab, presence of viral mutation(s) within the areas targeted by this assay, and inadequate number of viral copies(<138 copies/mL). A negative result must be combined with clinical observations, patient history, and  epidemiological information. The expected result is Negative.  Fact Sheet for Patients:  BloggerCourse.com  Fact Sheet for Healthcare Providers:  SeriousBroker.it  This test is no                          t yet approved or cleared by the [place] FDA and  has been authorized for detection and/or diagnosis of SARS-CoV-2 by FDA under an Emergency Use Authorization (EUA). This EUA will remain  in effect (meaning this test can be used) for the duration of the COVID-19 declaration under Section 564(b)(1) of the Act, 21 U.S.C.section 360bbb-3(b)(1), unless the authorization is terminated  or revoked sooner.       Influenza A by PCR 07/09/2022 NEGATIVE  NEGATIVE Final   Influenza B by PCR 07/09/2022 NEGATIVE  NEGATIVE Final   Comment: (NOTE) The Xpert Xpress SARS-CoV-2/FLU/RSV plus assay is intended as an aid in the diagnosis of influenza from Nasopharyngeal swab specimens and should not be used as a sole basis for treatment. Nasal washings and aspirates are unacceptable for Xpert Xpress SARS-CoV-2/FLU/RSV testing.  Fact Sheet for Patients: BloggerCourse.com  Fact Sheet for Healthcare Providers: SeriousBroker.it  This test is not yet approved or cleared by the [place] FDA and has been authorized for detection and/or diagnosis of SARS-CoV-2 by FDA under an Emergency Use Authorization (EUA). This EUA will remain in effect (meaning this test can be used) for the duration of the COVID-19 declaration under Section 564(b)(1) of the Act, 21 U.S.C. section 360bbb-3(b)(1), unless the authorization is terminated or revoked.     Resp Syncytial Virus by PCR 07/09/2022 NEGATIVE  NEGATIVE Final   Comment: (NOTE) Fact Sheet for Patients: BloggerCourse.com  Fact Sheet for Healthcare Providers: SeriousBroker.it  This test is not yet  approved or cleared by the [place] FDA and has been authorized for detection and/or diagnosis of SARS-CoV-2 by FDA under an Emergency Use Authorization (EUA). This EUA will remain in effect (meaning this test can be used) for the duration of the COVID-19 declaration under Section 564(b)(1) of the Act, 21 U.S.C. section 360bbb-3(b)(1), unless the authorization is terminated or revoked.  Performed at [facility], [address], [place], [place] [address]     Allergies: Iodine, Shellfish allergy,  and Iodine  PTA Medications: (Not in a hospital admission)   Medical Decision Making  Inpatient observation Lab Orders         CBC with Differential/Platelet         Comprehensive metabolic panel         Hemoglobin A1c         Lipid panel         TSH         POCT Urine Drug Screen - (I-Screen)     Meds ordered this encounter  Medications   acetaminophen (TYLENOL) tablet 650 mg   alum & mag hydroxide-simeth (MAALOX/MYLANTA) 200-200-20 MG/5ML suspension 30 mL   magnesium hydroxide (MILK OF MAGNESIA) suspension 30 mL       Recommendations  Based on my evaluation the patient appears to have an emergency medical condition for which I recommend the patient be transferred to the emergency department for further evaluation.  Sindy Guadeloupe, NP 07/09/22  2:16 AM
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
## 2022-07-09 NOTE — ED Notes (Signed)
Attempted again to call report to [facility].   Admission RN is currently unable to take report.   Pt is resting quietly.   AC made aware.

## 2022-07-09 NOTE — ED Notes (Signed)
Report called to [facility] at [facility] . Verbalized understanding.  Safe transport called.

## 2022-07-09 NOTE — Progress Notes (Signed)
Arthur Singleton 36 year old male is admitted voluntarily from [facility]. Per report, patient was reporting worsening suicidal thoughts for some weeks with a plan to overdose. Patient currently presents A&Ox4, steady gait, independent with ADLS, and denies any pain/discomfort. 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 Patient reports passive SI but denies HI and A/V/H with no plan or intent at the moment. Patient does state that he felt his depression becoming worse and that he has overdosed in the past about 10 years ago and does not want it to get that far again. Patient stated he has been prescribed medication (Prozac and Lamictal) but has not been taking it for over 6 months. 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 Patient denies financial issues and is able to verbally contract for safety on unit. Patient oriented to unit and provided with snacks. Patient calm and cooperative and remains safe on unit at this time.

## 2022-07-09 NOTE — ED Notes (Signed)
Attempted to call report and was unsuccessful.   Admissions nurse is to call back when ready.   Pt is currently resting quietly.  No distress noted.  [facility] AC notified.

## 2022-07-09 NOTE — Discharge Instructions (Signed)
Transfer to [facility] 

## 2022-07-09 NOTE — [facility] Group Notes (Signed)
Pt attended wrap up group 

## 2022-07-09 NOTE — Tx Team (Addendum)
Initial Treatment Plan 07/09/2022 7:27 PM Arthur Singleton [id number]    PATIENT STRESSORS: Medication change or noncompliance     PATIENT STRENGTHS: Ability for insight  Financial means    PATIENT IDENTIFIED PROBLEMS: Depression  Medication non compliance                   DISCHARGE CRITERIA:  Improved stabilization in mood, thinking, and/or behavior Motivation to continue treatment in a less acute level of care  PRELIMINARY DISCHARGE PLAN: Return to previous living arrangement  PATIENT/FAMILY INVOLVEMENT: This treatment plan has been presented to and reviewed with the patient, Arthur Singleton.  The patient has been given the opportunity to ask questions and make suggestions.  Roseanne Reno, RN 07/09/2022, 7:27 PM

## 2022-07-10 ENCOUNTER — Encounter (HOSPITAL_COMMUNITY): Payer: Self-pay

## 2022-07-10 DIAGNOSIS — S069XAA Unspecified intracranial injury with loss of consciousness status unknown, initial encounter: Secondary | ICD-10-CM | POA: Insufficient documentation

## 2022-07-10 DIAGNOSIS — F1290 Cannabis use, unspecified, uncomplicated: Secondary | ICD-10-CM | POA: Insufficient documentation

## 2022-07-10 DIAGNOSIS — F1421 Cocaine dependence, in remission: Secondary | ICD-10-CM | POA: Insufficient documentation

## 2022-07-10 DIAGNOSIS — F332 Major depressive disorder, recurrent severe without psychotic features: Secondary | ICD-10-CM | POA: Diagnosis not present

## 2022-07-10 DIAGNOSIS — F1590 Other stimulant use, unspecified, uncomplicated: Secondary | ICD-10-CM | POA: Insufficient documentation

## 2022-07-10 LAB — SARS CORONAVIRUS 2 BY RT PCR: SARS Coronavirus 2 by RT PCR: NEGATIVE

## 2022-07-10 MED ORDER — FLUOXETINE HCL 20 MG PO CAPS
20.0000 mg | ORAL_CAPSULE | Freq: Every day | ORAL | Status: DC
  Administered 2022-07-11 – 2022-07-13 (×3): 20 mg via ORAL
  Filled 2022-07-10 (×4): qty 1

## 2022-07-10 MED ORDER — FLUOXETINE HCL 10 MG PO CAPS
10.0000 mg | ORAL_CAPSULE | Freq: Every day | ORAL | Status: AC
  Administered 2022-07-10: 10 mg via ORAL
  Filled 2022-07-10: qty 1

## 2022-07-10 MED ORDER — TRAZODONE HCL 50 MG PO TABS
50.0000 mg | ORAL_TABLET | Freq: Every evening | ORAL | Status: DC | PRN
  Filled 2022-07-10: qty 1

## 2022-07-10 MED ORDER — NICOTINE 14 MG/24HR TD PT24
14.0000 mg | MEDICATED_PATCH | Freq: Every day | TRANSDERMAL | Status: DC
  Filled 2022-07-10 (×8): qty 1

## 2022-07-10 MED ORDER — LAMOTRIGINE 25 MG PO TABS
25.0000 mg | ORAL_TABLET | Freq: Every day | ORAL | Status: DC
  Administered 2022-07-10 – 2022-07-15 (×6): 25 mg via ORAL
  Filled 2022-07-10 (×8): qty 1
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
## 2022-07-10 NOTE — Progress Notes (Signed)
   07/10/22 0800  Psych Admission Type (Psych Patients Only)  Admission Status Voluntary  Psychosocial Assessment  Patient Complaints Depression  Eye Contact Fair  Facial Expression Sad  Affect Appropriate to circumstance  Speech Logical/coherent  Interaction Assertive  Motor Activity Other (Comment) (WDL)  Appearance/Hygiene Unremarkable  Behavior Characteristics Cooperative;Appropriate to situation  Mood Depressed  Thought Process  Coherency WDL  Content WDL  Delusions None reported or observed  Perception WDL  Hallucination None reported or observed  Judgment Limited  Confusion None  Danger to Self  Current suicidal ideation? Denies  Self-Injurious Behavior No self-injurious ideation or behavior indicators observed or expressed   Agreement Not to Harm Self Yes  Description of Agreement Verbally contracts for safety.  Danger to Others  Danger to Others None reported or observed

## 2022-07-10 NOTE — BHH Group Notes (Signed)
Pt attended 1/2 of AA Group.

## 2022-07-10 NOTE — Progress Notes (Signed)
   07/10/22 2200  Psych Admission Type (Psych Patients Only)  Admission Status Voluntary  Psychosocial Assessment  Patient Complaints Depression;Sleep disturbance;Anxiety  Eye Contact Fair  Facial Expression Sad  Affect Appropriate to circumstance  Speech Logical/coherent  Interaction Assertive  Motor Activity Other (Comment)  Appearance/Hygiene Unremarkable  Behavior Characteristics Appropriate to situation;Cooperative  Mood Depressed;Anxious  Thought Process  Coherency WDL  Content WDL  Delusions WDL  Perception WDL  Hallucination None reported or observed  Judgment Limited  Confusion None  Danger to Self  Current suicidal ideation? Denies (Denies)  Self-Injurious Behavior No self-injurious ideation or behavior indicators observed or expressed   Agreement Not to Harm Self Yes  Description of Agreement verbally contracted  Danger to Others  Danger to Others None reported or observed   Patient compliant with medications denies SI/HI/A/VH and verbally contracted for safety. Support and encouragement provided.

## 2022-07-10 NOTE — Plan of Care (Signed)
  Problem: Safety: Goal: Periods of time without injury will increase Outcome: Progressing   Problem: Coping: Goal: Coping ability will improve Outcome: Progressing   Problem: Activity: Goal: Interest or engagement in activities will improve Outcome: Progressing

## 2022-07-10 NOTE — Progress Notes (Signed)
   07/10/22 0000  Psych Admission Type (Psych Patients Only)  Admission Status Voluntary  Psychosocial Assessment  Patient Complaints Depression  Eye Contact Fair  Facial Expression Sad  Affect Appropriate to circumstance  Speech Logical/coherent  Interaction Assertive  Motor Activity Other (Comment) (WDL)  Appearance/Hygiene Unremarkable  Behavior Characteristics Cooperative;Appropriate to situation  Mood Depressed  Thought Process  Coherency WDL  Content WDL  Delusions None reported or observed  Perception WDL  Hallucination None reported or observed  Judgment Limited  Confusion None  Danger to Self  Current suicidal ideation? Denies  Self-Injurious Behavior No self-injurious ideation or behavior indicators observed or expressed   Agreement Not to Harm Self Yes  Description of Agreement verbally contracts for safety  Danger to Others  Danger to Others None reported or observed

## 2022-07-10 NOTE — B[facility] Group Notes (Signed)
[facility] Group Notes:  (Nursing)  Date:  07/10/2022  Time:  1300  Type of Therapy:  Psychoeducational Skills  Participation Level:  Did Not Attend    Shela Nevin 07/10/2022, 4:08 PM

## 2022-07-10 NOTE — Group Note (Signed)
Date:  07/10/2022 Time:  10:16 AM  Group Topic/Focus:  Goals Group:   The focus of this group is to help patients establish daily goals to achieve during treatment and discuss how the patient can incorporate goal setting into their daily lives to aide in recovery. Orientation:   The focus of this group is to educate the patient on the purpose and policies of crisis stabilization and provide a format to answer questions about their admission.  The group details unit policies and expectations of patients while admitted.    Participation Level:  Active  Participation Quality:  Appropriate  Affect:  Appropriate  Cognitive:  Appropriate  Insight: Appropriate  Engagement in Group:  Engaged  Modes of Intervention:  Discussion  Additional Comments:  Pt wants to stay on his medication.  Jaquita Rector 07/10/2022, 10:16 AM

## 2022-07-10 NOTE — BHH Suicide Risk Assessment (Signed)
BHH INPATIENT:  Family/Significant Other Suicide Prevention Education  Suicide Prevention Education:  Patient Refusal for Family/Significant Other Suicide Prevention Education: The patient Arthur Singleton has refused to provide written consent for family/significant other to be provided Family/Significant Other Suicide Prevention Education during admission and/or prior to discharge.  Physician notified.  Metro Kung Smita Lesh 07/10/2022, 9:44 AM

## 2022-07-10 NOTE — BH IP Treatment Plan (Signed)
Interdisciplinary Treatment and Diagnostic Plan Update  07/10/2022 Time of Session: 9:55 AM Arthur Singleton MRN: [id number]  Principal Diagnosis: MDD (major depressive disorder), recurrent severe, without psychosis (HCC)  Secondary Diagnoses: Principal Problem:   MDD (major depressive disorder), recurrent severe, without psychosis (HCC) Active Problems:   Generalized anxiety disorder   TBI (traumatic brain injury) (HCC)   Stimulant use disorder   Cannabis use disorder   Current Medications:  Current Facility-Administered Medications  Medication Dose Route Frequency Provider Last Rate Last Admin   acetaminophen (TYLENOL) tablet 650 mg  650 mg Oral Q6H PRN White, Patrice L, NP       alum & mag hydroxide-simeth (MAALOX/MYLANTA) 200-200-20 MG/5ML suspension 30 mL  30 mL Oral Q4H PRN White, Patrice L, NP       FLUoxetine (PROZAC) capsule 10 mg  10 mg Oral Daily Massengill, Nathan, MD       Followed by   Melene Muller ON 07/11/2022] FLUoxetine (PROZAC) capsule 20 mg  20 mg Oral Daily Massengill, Nathan, MD       hydrOXYzine (ATARAX) tablet 25 mg  25 mg Oral TID PRN White, Patrice L, NP       lamoTRIgine (LAMICTAL) tablet 25 mg  25 mg Oral Daily Massengill, Nathan, MD       magnesium hydroxide (MILK OF MAGNESIA) suspension 30 mL  30 mL Oral Daily PRN White, Patrice L, NP       nicotine (NICODERM CQ - dosed in mg/24 hours) patch 14 mg  14 mg Transdermal Daily Massengill, Nathan, MD       traZODone (DESYREL) tablet 50 mg  50 mg Oral QHS PRN Massengill, Nathan, MD       PTA Medications: No medications prior to admission.    Patient Stressors: Medication change or noncompliance    Patient Strengths: Ability for insight  Financial means   Treatment Modalities: Medication Management, Group therapy, Case management,  1 to 1 session with clinician, Psychoeducation, Recreational therapy.   Physician Treatment Plan for Primary Diagnosis: MDD (major depressive disorder), recurrent severe,  without psychosis (HCC) Long Term Goal(s): Improvement in symptoms so as ready for discharge   Short Term Goals: Ability to identify changes in lifestyle to reduce recurrence of condition will improve Ability to verbalize feelings will improve Ability to disclose and discuss suicidal ideas Ability to demonstrate self-control will improve Ability to identify and develop effective coping behaviors will improve Ability to maintain clinical measurements within normal limits will improve Compliance with prescribed medications will improve Ability to identify triggers associated with substance abuse/mental health issues will improve  Medication Management: Evaluate patient's response, side effects, and tolerance of medication regimen.  Therapeutic Interventions: 1 to 1 sessions, Unit Group sessions and Medication administration.  Evaluation of Outcomes: Not Progressing  Physician Treatment Plan for Secondary Diagnosis: Principal Problem:   MDD (major depressive disorder), recurrent severe, without psychosis (HCC) Active Problems:   Generalized anxiety disorder   TBI (traumatic brain injury) (HCC)   Stimulant use disorder   Cannabis use disorder  Long Term Goal(s): Improvement in symptoms so as ready for discharge   Short Term Goals: Ability to identify changes in lifestyle to reduce recurrence of condition will improve Ability to verbalize feelings will improve Ability to disclose and discuss suicidal ideas Ability to demonstrate self-control will improve Ability to identify and develop effective coping behaviors will improve Ability to maintain clinical measurements within normal limits will improve Compliance with prescribed medications will improve Ability to identify triggers associated with substance abuse/mental  health issues will improve     Medication Management: Evaluate patient's response, side effects, and tolerance of medication regimen.  Therapeutic Interventions: 1 to 1  sessions, Unit Group sessions and Medication administration.  Evaluation of Outcomes: Not Progressing   RN Treatment Plan for Primary Diagnosis: MDD (major depressive disorder), recurrent severe, without psychosis (HCC) Long Term Goal(s): Knowledge of disease and therapeutic regimen to maintain health will improve  Short Term Goals: Ability to remain free from injury will improve, Ability to verbalize frustration and anger appropriately will improve, Ability to demonstrate self-control, Ability to participate in decision making will improve, Ability to verbalize feelings will improve, Ability to disclose and discuss suicidal ideas, Ability to identify and develop effective coping behaviors will improve, and Compliance with prescribed medications will improve  Medication Management: RN will administer medications as ordered by provider, will assess and evaluate patient's response and provide education to patient for prescribed medication. RN will report any adverse and/or side effects to prescribing provider.  Therapeutic Interventions: 1 on 1 counseling sessions, Psychoeducation, Medication administration, Evaluate responses to treatment, Monitor vital signs and CBGs as ordered, Perform/monitor CIWA, COWS, AIMS and Fall Risk screenings as ordered, Perform wound care treatments as ordered.  Evaluation of Outcomes: Not Progressing   LCSW Treatment Plan for Primary Diagnosis: MDD (major depressive disorder), recurrent severe, without psychosis (HCC) Long Term Goal(s): Safe transition to appropriate next level of care at discharge, Engage patient in therapeutic group addressing interpersonal concerns.  Short Term Goals: Engage patient in aftercare planning with referrals and resources, Increase social support, Increase ability to appropriately verbalize feelings, Increase emotional regulation, Facilitate acceptance of mental health diagnosis and concerns, Facilitate patient progression through stages  of change regarding substance use diagnoses and concerns, Identify triggers associated with mental health/substance abuse issues, and Increase skills for wellness and recovery  Therapeutic Interventions: Assess for all discharge needs, 1 to 1 time with Social worker, Explore available resources and support systems, Assess for adequacy in community support network, Educate family and significant other(s) on suicide prevention, Complete Psychosocial Assessment, Interpersonal group therapy.  Evaluation of Outcomes: Not Progressing   Progress in Treatment: Attending groups: Yes. Participating in groups: Yes. Taking medication as prescribed: Yes. Toleration medication: Yes. Family/Significant other contact made: No, will contact:  Patient declined  Patient understands diagnosis: Yes. Discussing patient identified problems/goals with staff: Yes. Medical problems stabilized or resolved: Yes. Denies suicidal/homicidal ideation: Yes. Issues/concerns per patient self-inventory: No.   New problem(s) identified: No, Describe:  None reported  New Short Term/Long Term Goal(s):medication stabilization, elimination of SI thoughts, development of comprehensive mental wellness plan.    Patient Goals:  "be on medication and have his mood civilized"   Discharge Plan or Barriers: Patient recently admitted. CSW will continue to follow and assess for appropriate referrals and possible discharge planning.    Reason for Continuation of Hospitalization: Anxiety Depression Medication stabilization Suicidal ideation  Estimated Length of Stay:2-4 days   Last 3 [place] Suicide Severity Risk Score: Flowsheet Row Ad[facility] (Current[facility]  C-SSRS RISK CATEGORY Low Risk       Last PHQ 2/9 Scores:     No data to display          Scribe for Treatment Team: Isabella Bowens, LCSWA 07/10/2022 1:05 PM

## 2022-07-11 DIAGNOSIS — F332 Major depressive disorder, recurrent severe without psychotic features: Secondary | ICD-10-CM | POA: Diagnosis not present

## 2022-07-11 MED ORDER — HYDROXYZINE HCL 50 MG PO TABS
50.0000 mg | ORAL_TABLET | Freq: Every day | ORAL | Status: DC
  Administered 2022-07-11 – 2022-07-14 (×4): 50 mg via ORAL
  Filled 2022-07-11 (×8): qty 1

## 2022-07-11 MED ORDER — HYDROXYZINE HCL 50 MG PO TABS: 50.0000 mg | ORAL_TABLET | Freq: Every evening | ORAL | Status: DC | PRN

## 2022-07-11 NOTE — BHH Group Notes (Signed)
Goals Group 07/11/22   Group Focus: affirmation, clarity of thought, and goals/reality orientation Treatment Modality:  Psychoeducation Interventions utilized were assignment, group exercise, and support Purpose: To be able to understand and verbalize the reason for their admission to the hospital. To understand that the medication helps with their chemical imbalance but they also need to work on their choices in life. To be challenged to develop a list of 30 positives about themselves. Also introduce the concept that "feelings" are not reality.  Participation Level:  Active  Participation Quality:  Appropriate  Affect:  Appropriate  Cognitive:  Appropriate  Insight:  Improving  Engagement in Group:  Engaged  Additional Comments: Pt attended the group, but fell asleep. Rates his energy is a 10/10.  Dione Housekeeper

## 2022-07-11 NOTE — Progress Notes (Signed)
Psychoeducational Group Note  Date:  07/11/2022 Time:  2117  Group Topic/Focus:  Wrap-Up Group:   The focus of this group is to help patients review their daily goal of treatment and discuss progress on daily workbooks.  Participation Level: Did Not Attend  Participation Quality:  Not Applicable  Affect:  Not Applicable  Cognitive:  Not Applicable  Insight:  Not Applicable  Engagement in Group: Not Applicable  Additional Comments:  The patient did not attend group.   Hazle Coca S 07/11/2022, 9:17 PM

## 2022-07-11 NOTE — Progress Notes (Signed)
   07/11/22 1200  Psych Admission Type (Psych Patients Only)  Admission Status Voluntary  Psychosocial Assessment  Patient Complaints Depression  Eye Contact Avoids  Facial Expression Sad  Affect Appropriate to circumstance  Speech Logical/coherent  Interaction Assertive  Motor Activity Other (Comment) (WNL)  Appearance/Hygiene Unremarkable  Behavior Characteristics Cooperative;Appropriate to situation  Mood Depressed  Thought Process  Coherency WDL  Content WDL  Delusions WDL  Perception WDL  Hallucination None reported or observed  Judgment Limited  Confusion None  Danger to Self  Current suicidal ideation? Denies  Self-Injurious Behavior No self-injurious ideation or behavior indicators observed or expressed   Agreement Not to Harm Self Yes  Description of Agreement Verbal  Danger to Others  Danger to Others None reported or observed

## 2022-07-11 NOTE — Plan of Care (Signed)
  Problem: Activity: Goal: Interest or engagement in activities will improve Outcome: Progressing   Problem: Coping: Goal: Coping ability will improve Outcome: Progressing   Problem: Role Relationship: Goal: Will demonstrate positive changes in social behaviors and relationships Outcome: Progressing.  Patient compliant with medications denies SI/HI/A/VH and verbally contracted for safety. Q 15 minutes safety checks ongoing. Support and encouragement provided.

## 2022-07-11 NOTE — [facility] Group Notes (Signed)
Psychoeducational Group Note    Date:  07/11/2022 Time:1300-1400    Purpose of Group: . The group focus' on teaching patients on how to identify their needs and their Life Skills:  A group where two lists are made. What people need and what are things that we do that are unhealthy. The lists are developed by the patients and it is explained that we often do the actions that are not healthy to get our list of needs met.  Goal:: to develop the coping skills needed to get their needs met  Participation Level:  Active  Participation Quality:  Appropriate  Affect:  Appropriate  Cognitive:  Oriented  Insight: Improving  Engagement in Group:  Engaged  Modes of Intervention:  Activity, Discussion, Education, and Support  Additional Comments:  Rates energy at a 5/10. Participated fully in the group.  Dione Housekeeper

## 2022-07-11 NOTE — Progress Notes (Addendum)
[facility]  07/11/2022 7:15 AM Arthur SimmerChristopher Singleton  MRN:  [id number] Principal Problem: MDD (major depressive disorder), recurrent severe, without psychosis (HCC) Diagnosis: Principal Problem:   MDD (major depressive disorder), recurrent severe, without psychosis (HCC) Active Problems:   Generalized anxiety disorder   TBI (traumatic brain injury) (HCC)   Stimulant use disorder   Cannabis use disorder   Reason for Admission: Patient is a 36 year old male with a past psychiatric history of major depressive disorder and generalized anxiety disorder; neurological history of TBI and epilepsy; is admitted to the psychiatric hospital from the behavioral health urgent care center for evaluation and treatment of worsening depression and suicidal thoughts with plan to overdose on medication.  This is hospitalization day 2.   Subjective: Patient seen and assessed at bedside.  Patient denies SI/HI/AVH today.  Patient reports mood is somewhat improved compared to yesterday.  Reports less anxiety today.  Reports that restarting Prozac and Lamictal contributed to improvement in mental health symptoms.  Denies somatic complaints from initiation of medications.  Reports that sleep was poor last night but states that this is largely unchanged from even before he was depressed. He states he has been eating appropriately. We discussed vistaril 50 mg qhs to help with his sleep for which he was agreeable. Denies any noticeable skin changes since starting lamictal yesterday. Encouraged patient to attend group therapy as able.  Objective:  Chart Review Past 24 hours of patient's chart was reviewed.  Patient is compliant with scheduled meds. Required Agitation PRNs: none Per RN notes, no documented behavioral issues and is attending group. Patient slept, unknown hours  Total Time spent with patient: 30 minutes  Past Psychiatric History:  revious diagnoses of MDD and GAD.  Reports a history of at  least 3 psychiatric hospitalization for mental health, most recent was 10 years ago.  Reports at least 2 suicide attempts by overdose.  No current psychiatric medication.  Psychiatric medication history: Most recently taking Prozac and lamotrigine, stopped 3 years ago.  Reports having a rash on lamotrigine in 2009, but this was restarted by a neurologist, and he took the lamotrigine for 10 years thereafter without any side effects or rash. Patient also reports taking Zoloft, Lexapro, Celexa, Paxil, Effexor, lithium, Depakote, Trileptal, Abilify, Risperdal, Seroquel in the past.  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   Pseudoseizures    questioned after normal EEG 08/2011, staging "seizure" to obtain narcotic pain meds   Seizures (HCC)    Traumatic brain injury [facility](HCC)     Past Surgical History:  Procedure Laterality Date   BRAIN SURGERY     partial frontal lobectomy   LOBECTOMY     right arm growth plate     Family History:  Family History  Problem Relation Age of Onset   COPD Mother    Coronary artery disease Mother    Stroke Mother    Diabetes Mother    Diabetes Father    Family Psychiatric  History: : Denies any known family history of psychiatric illness or suicide attempt   Social History:  Social History   Substance and Sexual  Activity  Alcohol Use No     Social History   Substance and Sexual Activity  Drug Use Yes   Types: Marijuana, Methamphetamines    Social History   Socioeconomic History   Marital status: Single    Spouse name: Not on file   Number of children: Not on file   Years of education: Not on file   Highest education level: Not on file  Occupational  History   Not on file  Tobacco Use   Smoking status: Every Day    Packs/day: 1.00    Types: Cigarettes   Smokeless tobacco: Not on file  Substance and Sexual Activity   Alcohol use: No   Drug use: Yes    Types: Marijuana, Methamphetamines   Sexual activity: Yes    Birth control/protection: Condom  Other Topics Concern   Not on file  Social History Narrative   ** Merged History Encounter **       Social Determinants of Health   Financial Resource Strain: Not on file  Food Insecurity: No Food Insecurity (07/09/2022)   Hunger Vital Sign    Worried About Running Out of Food in the Last Year: Never true    Ran Out of Food in the Last Year: Never true  Transportation Needs: No Transportation Needs (07/09/2022)   PRAPARE - Administrator, Civil Service (Medical): No    Lack of Transportation (Non-Medical): No  Physical Activity: Not on file  Stress: Not on file  Social Connections: Not on file   Additional Social History:                         Current Medications: Current Facility-Administered Medications  Medication Dose Route Frequency Provider Last Rate Last Admin   acetaminophen (TYLENOL) tablet 650 mg  650 mg Oral Q6H PRN White, Patrice L, NP       alum & mag hydroxide-simeth (MAALOX/MYLANTA) 200-200-20 MG/5ML suspension 30 mL  30 mL Oral Q4H PRN White, Patrice L, NP       FLUoxetine (PROZAC) capsule 20 mg  20 mg Oral Daily Massengill, Nathan, MD       hydrOXYzine (ATARAX) tablet 25 mg  25 mg Oral TID PRN White, Patrice L, NP   25 mg at 07/10/22 2224   lamoTRIgine (LAMICTAL) tablet 25 mg  25 mg Oral Daily Massengill, Harrold Donath, MD   25 mg at 07/10/22 1731   magnesium hydroxide (MILK OF MAGNESIA) suspension 30 mL  30 mL Oral Daily PRN White, Patrice L, NP       nicotine (NICODERM CQ - dosed in mg/24 hours) patch 14 mg  14 mg Transdermal Daily Massengill, Nathan, MD       traZODone (DESYREL) tablet 50 mg  50 mg Oral QHS PRN Massengill, Nathan, MD         Lab Results:  No results found for this or any previous visit (from the past 24 hour(s)).  Blood Alcohol level:  Lab Results  Component Value Date   [facility] <11 11/29/2012   ETH <11 11/22/2012    Metabolic Disorder Labs: Lab Results  Component Value Date   HGBA1C 5.5 07/09/2022   MPG 111 07/09/2022   No results found for: "PROLACTIN" Lab Results  Component Value Date   CHOL 168 07/09/2022   TRIG 138 07/09/2022   HDL 31 (L) 07/09/2022   CHOLHDL 5.4 07/09/2022   VLDL 28 07/09/2022   LDLCALC 109 (H) 07/09/2022    Physical  Findings:  Musculoskeletal: Strength & Muscle Tone: within normal limits Gait & Station: normal  Psychiatric Specialty Exam:  Presentation  General Appearance:  Casual   Eye Contact: Good (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))   Speech: Normal Rate   Speech Volume: Normal   Handedness: Right    Mood and Affect  Mood: Anxious; Depressed; Hopeless   Affect: Congruent; Restricted    Thought Process  Thought Processes: Linear   Descriptions of Associations:Intact   Orientation:Full (Time, Place and Person)   Thought Content:Logical   History of Schizophrenia/Schizoaffective disorder:No data recorded  Duration of Psychotic Symptoms:No data recorded  Hallucinations:Hallucinations: None  Ideas of Reference:None   Suicidal Thoughts:Suicidal Thoughts: No (pt admitted for suicide ideation with plan to overdose)  Homicidal Thoughts:Homicidal Thoughts: No   Sensorium  Memory: Immediate Good; Recent Good; Remote Good   Judgment: Fair   Insight: Fair    Chartered certified accountant: Fair   Attention Span: Fair   Recall: [employer] of Knowledge: Fair   Language: Fair    Psychomotor Activity  Psychomotor Activity: Psychomotor Activity: Normal   Assets  Assets: Manufacturing systems engineer; Desire for Improvement; Financial Resources/Insurance; Leisure Time; Physical  Health    Sleep  Sleep: Sleep: Fair    Physical Exam: Review of Systems  Respiratory:  Negative for shortness of breath.   Cardiovascular:  Negative for chest pain.  Gastrointestinal:  Negative for abdominal pain, constipation, diarrhea, heartburn, nausea and vomiting.  Neurological:  Negative for headaches.   Blood pressure (!) 128/91, pulse 93, temperature 98 F (36.7 C), temperature source Oral, resp. rate 16, height 5\' 10"  (1.778 m), weight 118.4 kg, SpO2 98 %. Body mass index is 37.45 kg/m.   ASSESSMENT AND PLAN Patient is a 36 year old male with a past psychiatric history of major depressive disorder and generalized anxiety disorder; neurological history of TBI and epilepsy; is admitted to the psychiatric hospital from the behavioral health urgent care center for evaluation and treatment of worsening depression and suicidal thoughts with plan to overdose on medication. This is hospitalization day 2.  Patient's sleep disturbances likely secondary to stimulant use. I anticipate this would improve once he is no longer experiencing stimulant withdrawal. Stimulant use may have been to compensate for TBI as well as right frontal lobectomy. Will continue to monitor.   Diagnoses / Active Problems:   MDD severe recurrent without psychotic features GAD Stimulant use disorder Cannabis use disorder TBI History of epilepsy   PLAN: Safety and Monitoring:             --  Voluntary admission to inpatient psychiatric unit for safety, stabilization and treatment             -- Daily contact with patient to assess and evaluate symptoms and progress in treatment             -- Patient's case to be discussed in multi-disciplinary team meeting             -- Observation Level : q15 minute checks             -- Vital signs:  q12 hours             -- Precautions: suicide, elopement, and assault   2. Psychiatric Diagnoses and Treatment:                 -INCREASE fluoxetine to 20 mg once  daily for MDD and GAD -Continue lamotrigine 25 mg once daily  for MDD and history of epilepsy.  -Patient reports having rash on this medication in 2009, but neurologist put him back on this medication successfully for 10 more years without any rash or other side effects.   -Monitor for any dermatologic side effects to this medication. -Continue Hydroxyzine 25 mg tid prn for anxiety -Discontinue trazodone as patient reports "extremely vivid dreams" -START vistaril 50 mg qhs for sleep - Encouraged patient to participate in unit milieu and in scheduled group therapies               3. Medical Issues Being Addressed:              Tobacco Use Disorder             -- Nicotine patch 14 mg/24 hours ordered             -- Smoking cessation encouraged   4. Discharge Planning:              -- Social work and case management to assist with discharge planning and identification of hospital follow-up needs prior to discharge             -- Estimated LOS: 5-7 days             -- Discharge Concerns: Need to establish a safety plan; Medication compliance and effectiveness             -- Discharge Goals: Return home with outpatient referrals for mental health follow-up including medication management/psychotherapy     Park Pope, MD 07/11/2022, 7:15 AM

## 2022-07-11 NOTE — Group Note (Signed)
LCSW Group Therapy Note  07/11/2022      Type of Therapy and Topic:  Group Therapy: Gratitude   Description:   Group could not be held by social worker, but licensed RN did provide group.  A handout was given to each patient, with the following information:   Gratitude  "Acknowledging the good that you already have in your life is the foundation for all abundance." - Eckhart Tolle  " 'Enough' is a feast." - Buddhist Proverb  "Gratitude sweetens even the smallest moments."  "It is not joy that makes us grateful; It is gratitude that makes us joyful." - David Steindl-Rast    Put at least one response under each category of something for which you are grateful:  People:  Experiences:  Things:  Places:  Skills:  Other:  Add more responses as you get ideas from other people.   Therapeutic Modalities:   Activity  Luevenia Mcavoy J Grossman-Orr, LCSW  

## 2022-07-12 DIAGNOSIS — F332 Major depressive disorder, recurrent severe without psychotic features: Secondary | ICD-10-CM | POA: Diagnosis not present

## 2022-07-12 MED ORDER — MELATONIN 5 MG PO TABS
5.0000 mg | ORAL_TABLET | Freq: Every day | ORAL | Status: DC
  Administered 2022-07-12 – 2022-07-14 (×3): 5 mg via ORAL
  Filled 2022-07-12 (×5): qty 1

## 2022-07-12 NOTE — Progress Notes (Addendum)
[facility] MD Progress Note  07/12/2022 7:32 AM Arthur Singleton  MRN:  [id number] Principal Problem: MDD (major depressive disorder), recurrent severe, without psychosis (HCC) Diagnosis: Principal Problem:   MDD (major depressive disorder), recurrent severe, without psychosis (HCC) Active Problems:   Generalized anxiety disorder   TBI (traumatic brain injury) (HCC)   Stimulant use disorder   Cannabis use disorder   Reason for Admission: Patient is a 36 year old male with a past psychiatric history of major depressive disorder and generalized anxiety disorder; neurological history of TBI and epilepsy; is admitted to the psychiatric hospital from the behavioral health urgent care center for evaluation and treatment of worsening depression and suicidal thoughts with plan to overdose on medication.  This is hospitalization day 3.   Subjective: Patient seen and assessed at bedside.  Patient denies SI/HI/AVH today. Patient reports continued depression. He reports anxiety is elevated today due to poor sleep. He states that his sleep has worsened compared to last night because he kept getting up in the middle of night. He reports vivid dreams again last night but did not specify the content was related to any trauma.  Continues to deny somatic complaints from medications. He states he has been eating appropriately. We discussed starting melatonin to help with his sleep for which he was agreeable. Denies any noticeable skin changes since starting lamictal yesterday. Encouraged patient to attend group therapy as able.  Objective:  Chart Review Past 24 hours of patient's chart was reviewed.  Patient is compliant with scheduled meds. Required Agitation PRNs: none Per RN notes, no documented behavioral issues and is attending group. Patient slept, unknown hours  Total Time spent with patient: 30 minutes  Past Psychiatric History:  revious diagnoses of MDD and GAD.  Reports a history of at least 3  psychiatric hospitalization for mental health, most recent was 10 years ago.  Reports at least 2 suicide attempts by overdose.  No current psychiatric medication.  Psychiatric medication history: Most recently taking Prozac and lamotrigine, stopped 3 years ago.  Reports having a rash on lamotrigine in 2009, but this was restarted by a neurologist, and he took the lamotrigine for 10 years thereafter without any side effects or rash. Patient also reports taking Zoloft, Lexapro, Celexa, Paxil, Effexor, lithium, Depakote, Trileptal, Abilify, Risperdal, Seroquel in the past.  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   Pseudoseizures    questioned after normal EEG 08/2011, staging "seizure" to obtain narcotic pain meds   Seizures (HCC)    Traumatic brain injury [facility])     Past Surgical History:  Procedure Laterality Date   BRAIN SURGERY     partial frontal lobectomy   LOBECTOMY     right arm growth plate     Family History:  Family History  Problem Relation Age of Onset   COPD Mother    Coronary artery disease Mother    Stroke Mother    Diabetes Mother    Diabetes Father    Family Psychiatric  History: : Denies any known family history of psychiatric illness or suicide attempt   Social History:  Social History   Substance and  Sexual Activity  Alcohol Use No     Social History   Substance and Sexual Activity  Drug Use Yes   Types: Marijuana, Methamphetamines    Social History   Socioeconomic History   Marital status: Single    Spouse name: Not on file   Number of children: Not on file   Years of education: Not on file   Highest education level: Not on file  Occupational History    Not on file  Tobacco Use   Smoking status: Every Day    Packs/day: 1.00    Types: Cigarettes   Smokeless tobacco: Not on file  Substance and Sexual Activity   Alcohol use: No   Drug use: Yes    Types: Marijuana, Methamphetamines   Sexual activity: Yes    Birth control/protection: Condom  Other Topics Concern   Not on file  Social History Narrative   ** Merged History Encounter **       Social Determinants of Health   Financial Resource Strain: Not on file  Food Insecurity: No Food Insecurity (07/09/2022)   Hunger Vital Sign    Worried About Running Out of Food in the Last Year: Never true    Ran Out of Food in the Last Year: Never true  Transportation Needs: No Transportation Needs (07/09/2022)   PRAPARE - Administrator, Civil Service (Medical): No    Lack of Transportation (Non-Medical): No  Physical Activity: Not on file  Stress: Not on file  Social Connections: Not on file   Additional Social History:                         Current Medications: Current Facility-Administered Medications  Medication Dose Route Frequency Provider Last Rate Last Admin   acetaminophen (TYLENOL) tablet 650 mg  650 mg Oral Q6H PRN White, Patrice L, NP       alum & mag hydroxide-simeth (MAALOX/MYLANTA) 200-200-20 MG/5ML suspension 30 mL  30 mL Oral Q4H PRN White, Patrice L, NP       FLUoxetine (PROZAC) capsule 20 mg  20 mg Oral Daily Massengill, Nathan, MD   20 mg at 07/11/22 0803   hydrOXYzine (ATARAX) tablet 25 mg  25 mg Oral TID PRN Liborio Nixon L, NP   25 mg at 07/11/22 1633   hydrOXYzine (ATARAX) tablet 50 mg  50 mg Oral Seabron Spates, MD   50 mg at 07/11/22 2158   lamoTRIgine (LAMICTAL) tablet 25 mg  25 mg Oral Daily Massengill, Harrold Donath, MD   25 mg at 07/11/22 0803   magnesium hydroxide (MILK OF MAGNESIA) suspension 30 mL  30 mL Oral Daily PRN White, Patrice L, NP       nicotine (NICODERM CQ - dosed in mg/24 hours) patch 14 mg  14 mg Transdermal Daily  Massengill, Nathan, MD        Lab Results:  No results found for this or any previous visit (from the past 24 hour(s)).  Blood Alcohol level:  Lab Results  Component Value Date   [facility] <11 11/29/2012   ETH <11 11/22/2012    Metabolic Disorder Labs: Lab Results  Component Value Date   HGBA1C 5.5 07/09/2022   MPG 111 07/09/2022   No results found for: "PROLACTIN" Lab Results  Component Value Date   CHOL 168 07/09/2022   TRIG 138 07/09/2022   HDL 31 (L) 07/09/2022   CHOLHDL 5.4 07/09/2022   VLDL 28 07/09/2022   LDLCALC 109 (  H) 07/09/2022    Physical Findings:  Musculoskeletal: Strength & Muscle Tone: within normal limits Gait & Station: normal  Psychiatric Specialty Exam:  Presentation  General Appearance:  Casual   Eye Contact: Good (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))   Speech: Normal Rate   Speech Volume: Normal   Handedness: Right    Mood and Affect  Mood: Anxious; Depressed; Hopeless   Affect: Congruent; Restricted    Thought Process  Thought Processes: Linear   Descriptions of Associations:Intact   Orientation:Full (Time, Place and Person)   Thought Content:Logical   History of Schizophrenia/Schizoaffective disorder:No data recorded  Duration of Psychotic Symptoms:No data recorded  Hallucinations:No data recorded  Ideas of Reference:None   Suicidal Thoughts:No data recorded  Homicidal Thoughts:No data recorded   Sensorium  Memory: Immediate Good; Recent Good; Remote Good   Judgment: Fair   Insight: Fair    Chartered certified accountant: Fair   Attention Span: Fair   Recall: [employer] of Knowledge: Fair   Language: Fair    Psychomotor Activity  Psychomotor Activity: No data recorded   Assets  Assets: Communication Skills; Desire for Improvement; Financial Resources/Insurance; Leisure Time; Physical Health    Sleep  Sleep: No data  recorded    Physical Exam: Review of Systems  Respiratory:  Negative for shortness of breath.   Cardiovascular:  Negative for chest pain.  Gastrointestinal:  Negative for abdominal pain, constipation, diarrhea, heartburn, nausea and vomiting.  Neurological:  Negative for headaches.   Blood pressure (!) 129/100, pulse 80, temperature 98.3 F (36.8 C), temperature source Oral, resp. rate 16, height 5\' 10"  (1.778 m), weight 118.4 kg, SpO2 98 %. Body mass index is 37.45 kg/m.   ASSESSMENT AND PLAN Patient is a 36 year old male with a past psychiatric history of major depressive disorder and generalized anxiety disorder; neurological history of TBI and epilepsy; is admitted to the psychiatric hospital from the behavioral health urgent care center for evaluation and treatment of worsening depression and suicidal thoughts with plan to overdose on medication. This is hospitalization day 3.  Patient's sleep disturbances may be from increase in fluoxetine. He denies any motor abnormalities while sleeping that lead to arousal. Will continue at current dose to monitor. Starting melatonin to better aid with sleep. His anxiety is elevated secondary to poor sleep which is reflected in his elevated BP. He continues to be severely depressed warranting continued hospitalization. Could consider adderall or other stimulants as an outpatient if his TBI was being compensated with his cocaine and meth use.   Diagnoses / Active Problems:   MDD severe recurrent without psychotic features GAD Stimulant use disorder Cannabis use disorder TBI History of epilepsy   PLAN: Safety and Monitoring:             --  Voluntary admission to inpatient psychiatric unit for safety, stabilization and treatment             -- Daily contact with patient to assess and evaluate symptoms and progress in treatment             -- Patient's case to be discussed in multi-disciplinary team meeting             -- Observation Level :  q15 minute checks             -- Vital signs:  q12 hours             -- Precautions: suicide, elopement, and assault  2. Psychiatric Diagnoses and Treatment:  Major Depressive Disorder, Recurrent episode, severe w/o psychosis Stimulant Use Disorder  Cannabis Use Disorder TBI Remote Hx of epilepsy not on AED -Continue fluoxetine to 20 mg once daily for MDD and GAD -Continue lamotrigine 25 mg once daily for MDD and history of epilepsy.  -Patient reports having rash on this medication in 2009, but neurologist put him back on this medication successfully for 10 more years without any rash or other side effects.   -Monitor for any dermatologic side effects to this medication. -Continue Hydroxyzine 25 mg tid prn for anxiety -Discontinued trazodone as patient reports "extremely vivid dreams" -Continue vistaril 50 mg qhs for sleep -START melatonin 5 mg daily  -Other considerations for sleep aid are gabapentin, prazosin, ramelteon, seroquel (as adjunct to proazc), or (in short-term) restoril - Encouraged patient to participate in unit milieu and in scheduled group therapies               3. Medical Issues Being Addressed:              Tobacco Use Disorder             -- Nicotine patch 14 mg/24 hours ordered             -- Smoking cessation encouraged   4. Discharge Planning:              -- Social work and case management to assist with discharge planning and identification of hospital follow-up needs prior to discharge             -- Estimated LOS: 5-7 days             -- Discharge Concerns: Need to establish a safety plan; Medication compliance and effectiveness             -- Discharge Goals: Return home with outpatient referrals for mental health follow-up including medication management/psychotherapy     Park PopeAndrew Margrit Minner, MD 07/12/2022, 7:32 AM

## 2022-07-12 NOTE — Group Note (Signed)
[facility] LCSW Group Therapy Note  07/12/2022  10:00-11:00AM  Type of Therapy and Topic:  Group Therapy:  Adding Supports Including Myself  Participation Level:  Did Not Attend   Description of Group:  Patients in this group were introduced to the differences between healthy supports and unheathy supports.  This led to a lengthy and emotional discussion about how to set boundaries, particularly with family members.  Many in group expressed that they put other people before themselves.  The group discussed that this not only hurts them, but ultimately ends up hurting the people they want to help.  Examples were given about how to set boundaries one at a time rather than merely cutting people off.  A song entitled "My Own Hero" was played.  A group discussion ensued in which patients stated they could relate to the song and it inspired them to realize they have be willing to help themselves in order to succeed, because other people cannot achieve sobriety or stability for them.  We discussed adding a variety of healthy supports to address the various needs in our lives.    Therapeutic Goals: 1)  demonstrate the importance of being a part of one's own support system by asking for and accepting help 2)  discuss reasons people in one's life may eventually be unable to be continually supportive  3)  identify the patient's current support system and   4)  elicit commitments to add healthy supports and to become more conscious of being self-supportive   Summary of Patient Progress:  N/A  Therapeutic Modalities:   Motivational Interviewing Activity  Branch Pacitti J Grossman-Orr      

## 2022-07-12 NOTE — [facility] Group Notes (Signed)
Adult Psychoeducational Group  Date:  07/12/2022 Time:  1100-1200  Group Topic/Focus: Continuation of the group from Saturday. Looking at the lists that were created and talking about what needs to be done with the homework of 30 positives about themselves.                                     Talking about taking their power back and helping themselves to develop a positive self esteem.      Participation Quality:  Appropriate  Affect:  Appropriate  Cognitive:  Oriented  Insight: Improving  Engagement in Group:  Engaged  Modes of Intervention:  Activity, Discussion, Education, and Support  Additional Comments:  attended the group and rates his energy at a 5/10. Participated fully in the group.  Dione Housekeeper

## 2022-07-12 NOTE — Progress Notes (Signed)
   07/12/22 0755  Psych Admission Type (Psych Patients Only)  Admission Status Voluntary  Psychosocial Assessment  Patient Complaints Depression;Sadness  Eye Contact Avoids  Facial Expression Sad  Affect Irritable  Speech Logical/coherent  Interaction Assertive;Guarded  Motor Activity Other (Comment) (WNL)  Appearance/Hygiene In scrubs  Behavior Characteristics Irritable  Mood Depressed;Irritable  Thought Process  Coherency Blocking  Content WDL  Delusions None reported or observed  Perception WDL  Hallucination None reported or observed  Judgment Limited  Confusion None  Danger to Self  Current suicidal ideation? Denies  Self-Injurious Behavior No self-injurious ideation or behavior indicators observed or expressed   Agreement Not to Harm Self Yes  Description of Agreement Verbal  Danger to Others  Danger to Others None reported or observed

## 2022-07-12 NOTE — [facility] Group Notes (Signed)
Adult Psychoeducational Group Note Date:  07/12/2022 Time:  0900-1000 Group Topic/Focus: PROGRESSIVE RELAXATION. Singleton group where deep breathing is taught and tensing and relaxation muscle groups is used. Imagery is used as well.  Pts are asked to imagine 3 pillars that hold them up when they are not able to hold themselves up and to share that with the group.   Participation Level: did not attend   : Arthur Singleton   

## 2022-07-12 NOTE — [facility] Group Notes (Signed)
Adult Psychoeducational Group Note  Date:  07/12/2022 Time:  9:31 PM  Group Topic/Focus:  Wrap-Up Group:   The focus of this group is to help patients review their daily goal of treatment and discuss progress on daily workbooks.  Participation Level:  Active  Participation Quality:  Appropriate and Attentive  Affect:  Appropriate  Cognitive:  Alert and Appropriate  Insight: Appropriate and Good  Engagement in Group:  Engaged  Modes of Intervention:  Discussion and Education  Additional Comments:  Pt attended and participated in wrap up group this evening and rated their day a 4/10. Pt uses "self relaxation" as a helpful coping skill. Pt did not complete their goal, which was to improve their mood.   Chrisandra Netters 07/12/2022, 9:31 PM

## 2022-07-13 DIAGNOSIS — F332 Major depressive disorder, recurrent severe without psychotic features: Secondary | ICD-10-CM | POA: Diagnosis not present

## 2022-07-13 DIAGNOSIS — I10 Essential (primary) hypertension: Secondary | ICD-10-CM | POA: Insufficient documentation

## 2022-07-13 MED ORDER — FLUOXETINE HCL 10 MG PO CAPS
10.0000 mg | ORAL_CAPSULE | Freq: Every day | ORAL | Status: AC
  Administered 2022-07-13: 10 mg via ORAL
  Filled 2022-07-13 (×2): qty 1

## 2022-07-13 MED ORDER — FLUOXETINE HCL 20 MG PO CAPS
40.0000 mg | ORAL_CAPSULE | Freq: Every day | ORAL | Status: DC
  Administered 2022-07-14 – 2022-07-15 (×2): 40 mg via ORAL
  Filled 2022-07-13 (×4): qty 2

## 2022-07-13 MED ORDER — AMLODIPINE BESYLATE 5 MG PO TABS
5.0000 mg | ORAL_TABLET | Freq: Every day | ORAL | Status: DC
  Administered 2022-07-13 – 2022-07-14 (×2): 5 mg via ORAL
  Filled 2022-07-13 (×4): qty 1

## 2022-07-13 NOTE — Progress Notes (Signed)
Adult Psychoeducational Group Note  Date:  07/13/2022 Time:  9:56 PM  Group Topic/Focus:  AA Group  Participation Level:  Active  Participation Quality:  Appropriate  Affect:  Appropriate  Cognitive:  Appropriate  Insight: Appropriate  Engagement in Group:  Engaged  Modes of Intervention:  Discussion and Education  Additional Comments:   Pt attended and participated in the AA/Wrap Up group.  Edmund Hilda Hodan Wurtz 07/13/2022, 9:56 PM

## 2022-07-13 NOTE — [facility] Counselor (Signed)
CSW spoke with the Pt about discahrge planning.  The Pt stated that he would like to return to his car (that he states is parked near the [employer] in [place]).  He states that he will then go back to his home in [place].  The Pt requested a list of residential treatment centers to follow-up with at a later date.  The Pt also states that he does not believe he has Medicare Part A&B. He states that he received these insurances while he was getting Disability Benefits.  CSW will inform the correct staff members of this information about medical insurance.

## 2022-07-13 NOTE — [facility] Group Notes (Signed)
Spiritual care group on grief and loss facilitated by Chaplain Dyanne Carrel, [facility], and Chaplain Genesis Adams   Group Goal:  Support / Education around grief and loss  Members engage in facilitated group support and psycho-social education.  Group Description:  Following introductions and group rules, group members engaged in facilitated group dialog and support around topic of loss, with particular support around experiences of loss in their lives. Group Identified types of loss (relationships / self / things) and identified patterns, circumstances, and changes that precipitate losses. Reflected on thoughts / feelings around loss, normalized grief responses, and recognized variety in grief experience.  Group drew on Adlerian / Rogerian, narrative, MI,  Patient Progress: Arthur Singleton attended group and participated to a limited extent.  He did not remain for the entire session.  [address] [facility] Pager, [phone]

## 2022-07-13 NOTE — Progress Notes (Addendum)
Patient appears cooperative but anxious. Patient received atarax po prn earlier this morning which was effective in decreasing anxiety. Patient stated his dreams were not bad yesterday and got much better sleep last night. Patient denies SI, HI, and A/V/H with no plan/intent but states he is still dealing with his depression. Patient resting in room throughout day. Did not join recreation activities. No s/s of current distress.     07/13/22 0813  Psych Admission Type (Psych Patients Only)  Admission Status Voluntary  Psychosocial Assessment  Patient Complaints Depression;Anxiety  Eye Contact Fair  Facial Expression Sad  Affect Appropriate to circumstance  Speech Logical/coherent  Interaction Assertive  Motor Activity Other (Comment) (WDL)  Appearance/Hygiene Unremarkable  Behavior Characteristics Cooperative;Anxious  Mood Depressed;Anxious  Thought Process  Coherency WDL  Content WDL  Delusions None reported or observed  Perception WDL  Hallucination None reported or observed  Judgment Impaired  Confusion None  Danger to Self  Current suicidal ideation? Denies  Self-Injurious Behavior No self-injurious ideation or behavior indicators observed or expressed   Agreement Not to Harm Self Yes  Description of Agreement Verbally contracts for safety  Danger to Others  Danger to Others None reported or observed

## 2022-07-13 NOTE — Plan of Care (Signed)
  Problem: Education: Goal: Mental status will improve Outcome: Progressing   Problem: Safety: Goal: Periods of time without injury will increase 07/13/2022 1027 by Roseanne Reno, RN Outcome: Progressing 07/13/2022 1026 by Roseanne Reno, RN Outcome: Progressing   Problem: Activity: Goal: Sleeping patterns will improve Outcome: Progressing

## 2022-07-13 NOTE — [facility] Group Notes (Signed)
Adult Psychoeducational Group Note   Date:  07/13/2022 Time:  9:23 AM   Group Topic/Focus:    Emotional Education:   The focus of this group is to discuss what feelings/emotions are, and how they are experienced.   Goals Group:   The focus of this group is to help patients establish daily goals to achieve during treatment and discuss how the patient can incorporate goal setting into their daily lives to aide in recovery.   Healthy Communication:   The focus of this group is to discuss communication, barriers to communication, as well as healthy ways to communicate with others.   Participation Level:  Did not participate   Participation Quality: Did not participate   Affect:  Did not participate   Cognitive:  Did not participate   Insight: Did not participate   Engagement in Group:  Did not participate   Modes of Intervention:  Discussion, Education, and Exploration   Additional Comments: Patient did not attend group. Invited but did not attend   Arrian Manson R  RN           

## 2022-07-13 NOTE — Progress Notes (Addendum)
[facility]  07/13/2022 12:58 PM Skeet SimmerChristopher Kurihara  MRN:  [id number]  Subjective:    Patient is a 36 year old male with a past psychiatric history of major depressive disorder and generalized anxiety disorder; neurological history of TBI and epilepsy; is admitted to the psychiatric hospital from the behavioral health urgent care center for evaluation and treatment of worsening depression and suicidal thoughts with plan to overdose on medication.    On evaluation today, the pt reports some improvement of depressed mood since admission and restarting medications, reporting that his mood is still 5/10 (10 worst depression). Reports anxiety is still a problem. Reports sleep is better. Denies si today. Denies HI, psychotic symptoms. Denies s/e to current scheduled psych meds incl any derm s/e to lamictal.  He is agreeable with increasing prozac for further treating depression and anxiety symptoms.    Principal Problem: MDD (major depressive disorder), recurrent severe, without psychosis (HCC) Diagnosis: Principal Problem:   MDD (major depressive disorder), recurrent severe, without psychosis (HCC) Active Problems:   Generalized anxiety disorder   TBI (traumatic brain injury) (HCC)   Stimulant use disorder   Cannabis use disorder  Total Time spent with patient: 15 minutes  Past Psychiatric History:  Pevious diagnoses of MDD and GAD.  Reports a history of at least 3 psychiatric hospitalization for mental health, most recent was 10 years ago.  Reports at least 2 suicide attempts by overdose.  No current psychiatric medication.  Psychiatric medication history: Most recently taking Prozac and lamotrigine, stopped 3 years ago.  Reports having a rash on lamotrigine in 2009, but this was restarted by a neurologist, and he took the lamotrigine for 10 years thereafter without any side effects or rash. Patient also reports taking Zoloft, Lexapro, Celexa, Paxil, Effexor, lithium, Depakote,  Trileptal, Abilify, Risperdal, Seroquel in the past.  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   Pseudoseizures    questioned after normal EEG 08/2011, staging "seizure" to obtain narcotic pain meds   Seizures (HCC)    Traumatic brain injury [facility](HCC)     Past Surgical History:  Procedure Laterality Date   BRAIN SURGERY     partial frontal lobectomy   LOBECTOMY     right arm growth plate     Family History:  Family History  Problem Relation Age of Onset   COPD Mother    Coronary artery disease Mother    Stroke Mother    Diabetes Mother    Diabetes Father    Family Psychiatric  History:  Denies any known family history of psychiatric illness or suicide attempt   Social History:  Social History   Substance and Sexual Activity  Alcohol Use No     Social History   Substance and Sexual Activity  Drug Use Yes   Types: Marijuana, Methamphetamines    Social History   Socioeconomic History   Marital status: Single    Spouse name: Not on file   Number of children: Not on file   Years of education: Not on file   Highest education level: Not on file  Occupational History   Not on file  Tobacco Use   Smoking status: Every Day    Packs/day: 1.00    Types: Cigarettes   Smokeless tobacco: Not on file  Substance and Sexual Activity   Alcohol use: No   Drug use: Yes    Types: Marijuana, Methamphetamines   Sexual activity: Yes    Birth control/protection: Condom  Other Topics Concern   Not on file  Social History Narrative   ** Merged History Encounter **       Social Determinants of Health   Financial Resource Strain: Not on file  Food Insecurity: No Food Insecurity  (07/09/2022)   Hunger Vital Sign    Worried About Running Out of Food in the Last Year: Never true    Ran Out of Food in the Last Year: Never true  Transportation Needs: No Transportation Needs (07/09/2022)   PRAPARE - Administrator, Civil Service (Medical): No    Lack of Transportation (Non-Medical): No  Physical Activity: Not on file  Stress: Not on file  Social Connections: Not on file   Additional Social History:                         Sleep: Fair  Appetite:  Fair  Current Medications: Current Facility-Administered Medications  Medication Dose Route Frequency Provider Last Rate Last Admin   acetaminophen (TYLENOL) tablet 650 mg  650 mg Oral Q6H PRN White, Patrice L, NP       alum & mag hydroxide-simeth (MAALOX/MYLANTA) 200-200-20 MG/5ML suspension 30 mL  30 mL Oral Q4H PRN White, Patrice L, NP       [START ON 07/14/2022] FLUoxetine (PROZAC) capsule 40 mg  40 mg Oral Daily Avigdor Dollar, MD       hydrOXYzine (ATARAX) tablet 25 mg  25 mg Oral TID PRN White, Patrice L, NP   25 mg at 07/13/22 1761   hydrOXYzine (ATARAX) tablet 50 mg  50 mg Oral Seabron Spates, MD   50 mg at 07/12/22 2116   lamoTRIgine (LAMICTAL) tablet 25 mg  25 mg Oral Daily Frandy Basnett, Harrold Donath, MD   25 mg at 07/13/22 0813   magnesium hydroxide (MILK OF MAGNESIA) suspension 30 mL  30 mL Oral Daily PRN White, Patrice L, NP       melatonin tablet 5 mg  5 mg Oral QHS Park Pope, MD   5 mg at 07/12/22 2116   nicotine (NICODERM CQ - dosed in mg/24 hours) patch 14 mg  14 mg Transdermal Daily Surabhi Gadea, MD        Lab Results: No results found for this or any previous visit (from the past 48 hour(s)).  Blood Alcohol level:  Lab Results  Component Value Date   [facility] <11 11/29/2012   ETH <11 11/22/2012    Metabolic Disorder Labs: Lab Results  Component Value Date   HGBA1C 5.5 07/09/2022   MPG 111 07/09/2022   No results found for: "PROLACTIN" Lab Results  Component Value Date    CHOL 168 07/09/2022   TRIG 138 07/09/2022   HDL 31 (L) 07/09/2022   CHOLHDL 5.4 07/09/2022   VLDL 28 07/09/2022   LDLCALC 109 (H) 07/09/2022    Physical Findings: AIMS:  , ,  ,  ,    CIWA:    COWS:     Musculoskeletal: Strength & Muscle Tone: within normal limits Gait & Station: normal Patient leans: N/A  Psychiatric Specialty Exam:  Presentation  General Appearance:  Casual  Eye  Contact: Good  Speech: Clear and Coherent; Normal Rate  Speech Volume: Normal  Handedness: Right   Mood and Affect  Mood: Dysphoric; Anxious; Depressed  Affect: Congruent   Thought Process  Thought Processes: Linear  Descriptions of Associations:Intact  Orientation:Full (Time, Place and Person)  Thought Content:Logical  History of Schizophrenia/Schizoaffective disorder:No data recorded Duration of Psychotic Symptoms:No data recorded Hallucinations:Hallucinations: None  Ideas of Reference:None  Suicidal Thoughts:Suicidal Thoughts: No  Homicidal Thoughts:Homicidal Thoughts: No   Sensorium  Memory: Immediate Good; Recent Good; Remote Good  Judgment: Fair  Insight: Fair   Chartered certified accountant: Fair  Attention Span: Fair  Recall: [employer] of Knowledge: Fair  Language: Fair   Psychomotor Activity  Psychomotor Activity: Psychomotor Activity: Normal   Assets  Assets: Communication Skills; Desire for Improvement; Financial Resources/Insurance; Leisure Time; Physical Health   Sleep  Sleep: Sleep: Fair    Physical Exam: Physical Exam Vitals reviewed.  Neurological:     Motor: No weakness.     Gait: Gait normal.    Review of Systems  Psychiatric/Behavioral:  Positive for depression and substance abuse. Negative for hallucinations and suicidal ideas. The patient is nervous/anxious. The patient does not have insomnia.    Blood pressure (!) 132/96, pulse 83, temperature 98.5 F (36.9 C), temperature source Oral,  resp. rate 16, height 5\' 10"  (1.778 m), weight 118.4 kg, SpO2 98 %. Body mass index is 37.45 kg/m.   Treatment Plan Summary: Daily contact with patient to assess and evaluate symptoms and progress in treatment and Medication management   MDD severe recurrent without psychotic features GAD Stimulant use disorder Cannabis use disorder TBI History of epilepsy   PLAN: Safety and Monitoring:             --  Voluntary admission to inpatient psychiatric unit for safety, stabilization and treatment             -- Daily contact with patient to assess and evaluate symptoms and progress in treatment             -- Patient's case to be discussed in multi-disciplinary team meeting             -- Observation Level : q15 minute checks             -- Vital signs:  q12 hours             -- Precautions: suicide, elopement, and assault   2. Psychiatric Diagnoses and Treatment:  Major Depressive Disorder, Recurrent episode, severe w/o psychosis Stimulant Use Disorder  Cannabis Use Disorder TBI Remote Hx of epilepsy not on AED -Increase fluoxetine to 30 mg once daily today. Incr to 40 mg tomorrow - for MDD and GAD -Continue lamotrigine 25 mg once daily for MDD and history of epilepsy.  -Patient reports having rash on this medication in 2009, but neurologist put him back on this medication successfully for 10 more years without any rash or other side effects.   -Monitor for any dermatologic side effects to this medication. -Continue Hydroxyzine 25 mg tid prn for anxiety -Discontinued trazodone as patient reports "extremely vivid dreams" -Continue vistaril 50 mg qhs for sleep -Continue melatonin 5 mg daily             -Other considerations for sleep aid are gabapentin, prazosin, ramelteon, seroquel (as adjunct to proazc), or (in short-term) restoril - Encouraged patient to participate in unit milieu and in scheduled group therapies  3. Medical Issues Being Addressed:               Tobacco Use Disorder             -- Nicotine patch 14 mg/24 hours ordered             -- Smoking cessation encouraged   HTN -start norvasc 5 mg daily  4. Discharge Planning:              -- Social work and case management to assist with discharge planning and identification of hospital follow-up needs prior to discharge             -- Estimated LOS: 5-7 days             -- Discharge Concerns: Need to establish a safety plan; Medication compliance and effectiveness             -- Discharge Goals: Return home with outpatient referrals for mental health follow-up including medication management/psychotherapy   Cristy Hilts, MD 07/13/2022, 12:58 PM  Total Time Spent in Direct Patient Care:  I personally spent 35 minutes on the unit in direct patient care. The direct patient care time included face-to-face time with the patient, reviewing the patient's chart, communicating with other professionals, and coordinating care. Greater than 50% of this time was spent in counseling or coordinating care with the patient regarding goals of hospitalization, psycho-education, and discharge planning needs.   Phineas Inches, MD Psychiatrist

## 2022-07-13 NOTE — Group Note (Signed)
Recreation Therapy Group Note   Group Topic:Other  Group Date: 07/13/2022 Start Time: 0935 End Time: 1010 Facilitators: Zane Pellecchia-McCall, LRT,CTRS Location: [address]   Goal Area(s) Addresses:  Patient will participate in the creative process to complete all crafts.  Patient will interact pro-socially with staff and peers. Patient will share traditions, activities, and positive feelings produced during the holidays.   Group Description: LRT facilitated a therapeutic art activity to encourage self-expression and creativity in recognition of the approaching holidays. Writer explained that the folded paper icicles and chalk drawings created in session will be used to decorate the unit to boost mood and create a positive atmosphere. Patients were encouraged to engage in leisure discussions about festive activities and hobbies they like to participate in during the winter season.   Affect/Mood: N/A   Participation Level: Did not attend    Clinical Observations/Individualized Feedback:      Plan: Continue to engage patient in RT group sessions 2-3x/week.   Arthur Singleton, LRT,CTRS 07/13/2022 11:28 AM

## 2022-07-14 DIAGNOSIS — F332 Major depressive disorder, recurrent severe without psychotic features: Secondary | ICD-10-CM | POA: Diagnosis not present

## 2022-07-14 MED ORDER — AMLODIPINE BESYLATE 5 MG PO TABS
5.0000 mg | ORAL_TABLET | Freq: Every day | ORAL | Status: AC
  Administered 2022-07-14: 5 mg via ORAL

## 2022-07-14 MED ORDER — PRAZOSIN HCL 1 MG PO CAPS
1.0000 mg | ORAL_CAPSULE | Freq: Every day | ORAL | Status: DC
  Administered 2022-07-14: 1 mg via ORAL
  Filled 2022-07-14 (×3): qty 1

## 2022-07-14 MED ORDER — CLONIDINE HCL 0.1 MG PO TABS
0.1000 mg | ORAL_TABLET | ORAL | Status: DC | PRN
  Administered 2022-07-14: 0.1 mg via ORAL
  Filled 2022-07-14: qty 1

## 2022-07-14 MED ORDER — WHITE PETROLATUM EX OINT
TOPICAL_OINTMENT | CUTANEOUS | Status: AC
  Filled 2022-07-14: qty 5

## 2022-07-14 MED ORDER — AMLODIPINE BESYLATE 10 MG PO TABS
10.0000 mg | ORAL_TABLET | Freq: Every day | ORAL | Status: DC
  Administered 2022-07-15: 10 mg via ORAL
  Filled 2022-07-14 (×3): qty 1

## 2022-07-14 NOTE — Progress Notes (Signed)
Adult Psychoeducational Group Note  Date:  07/14/2022 Time:  3:57 PM  Group Topic/Focus:  Orientation:   The focus of this group is to educate the patient on the purpose and policies of crisis stabilization and provide a format to answer questions about their admission.  The group details unit policies and expectations of patients while admitted.  Participation Level:  Active  Participation Quality:  Appropriate  Affect:  Appropriate  Cognitive:  Appropriate  Insight: Appropriate  Engagement in Group:  Engaged  Modes of Intervention:  Discussion  Additional Comments:  Pt attended the orientation group and remained appropriate and engaged throughout the duration of the group.   Sheran Lawless 07/14/2022, 3:57 PM

## 2022-07-14 NOTE — Group Note (Signed)
Recreation Therapy Group Note   Group Topic:Animal Assisted Therapy   Group Date: 07/14/2022 Start Time: 1430 End Time: 1515 Facilitators: Jaylin Roundy-McCall, LRT,CTRS Location: [address]   Animal-Assisted Activity (AAA) Program Checklist/Progress Notes Patient Eligibility Criteria Checklist & Daily Group note for Rec Tx Intervention  AAA/T Program Assumption of Risk Form signed by Patient/ or Parent Legal Guardian Yes  Patient is free of allergies or severe asthma Yes  Patient reports no fear of animals Yes  Patient reports no history of cruelty to animals Yes  Patient understands his/her participation is voluntary Yes  Patient washes hands before animal contact Yes  Patient washes hands after animal contact Yes   Affect/Mood: Appropriate   Participation Level: Engaged   Participation Quality: Independent   Behavior: Appropriate    Clinical Observations/Individualized Feedback:   Pt attended and participated in group session.   Plan: Continue to engage patient in RT group sessions 2-3x/week.   Chais Fehringer-McCall, LRT,CTRS 07/14/2022 3:40 PM

## 2022-07-14 NOTE — Progress Notes (Signed)
   07/13/22 2126  Psych Admission Type (Psych Patients Only)  Admission Status Voluntary  Psychosocial Assessment  Patient Complaints Anxiety;Depression  Eye Contact Fair  Facial Expression Sad  Affect Appropriate to circumstance  Speech Logical/coherent  Interaction Assertive  Motor Activity Other (Comment) (WDL)  Appearance/Hygiene Unremarkable  Behavior Characteristics Cooperative;Appropriate to situation  Mood Depressed;Anxious  Thought Process  Coherency WDL  Content WDL  Delusions None reported or observed  Perception WDL  Hallucination None reported or observed  Judgment Limited  Confusion None  Danger to Self  Current suicidal ideation? Denies  Self-Injurious Behavior No self-injurious ideation or behavior indicators observed or expressed   Agreement Not to Harm Self Yes  Description of Agreement Verbally contracts for safety.  Danger to Others  Danger to Others None reported or observed   Patient alert and oriented. Presenting appropriate to circumstance with an anxious, depressed mood. Patient denies SI, HI, AVH, and pain. Patient rates anxiety 7/10, and depression 5/10, on 0-10 scale with 0 being the least and 10 being the worst. Scheduled medications administered to patient, per provider orders. Support and encouragement provided.  Routine safety checks conducted every 15 minutes. Patient verbally contracts for safety and remains safe on the unit.

## 2022-07-14 NOTE — Progress Notes (Signed)
[facility]  07/14/2022 8:33 PM Arthur SimmerChristopher Singleton  MRN:  [id number]  Subjective:    Patient is a 36 year old male with a past psychiatric history of major depressive disorder and generalized anxiety disorder; neurological history of TBI and epilepsy; is admitted to the psychiatric hospital from the behavioral health urgent care center for evaluation and treatment of worsening depression and suicidal thoughts with plan to overdose on medication.    On evaluation today, the pt reports some improvement of depressed mood since admission and restarting medications. Reprots that increasing prozac yesterday was helpful for mood and anxiety.  Reports poor sleep 2/2 nightmares and agreeable to starting prazosin.  We are monitoring HTN response to starting and incr norvasc, and clonidine prn.  Denies si today. Denies HI, psychotic symptoms. Denies s/e to current scheduled psych meds incl any derm s/e to lamictal.     Principal Problem: MDD (major depressive disorder), recurrent severe, without psychosis (HCC) Diagnosis: Principal Problem:   MDD (major depressive disorder), recurrent severe, without psychosis (HCC) Active Problems:   Generalized anxiety disorder   TBI (traumatic brain injury) (HCC)   Stimulant use disorder   Cannabis use disorder   HTN (hypertension)  Total Time spent with patient: 15 minutes  Past Psychiatric History:  Pevious diagnoses of MDD and GAD.  Reports a history of at least 3 psychiatric hospitalization for mental health, most recent was 10 years ago.  Reports at least 2 suicide attempts by overdose.  No current psychiatric medication.  Psychiatric medication history: Most recently taking Prozac and lamotrigine, stopped 3 years ago.  Reports having a rash on lamotrigine in 2009, but this was restarted by a neurologist, and he took the lamotrigine for 10 years thereafter without any side effects or rash. Patient also reports taking Zoloft, Lexapro, Celexa,  Paxil, Effexor, lithium, Depakote, Trileptal, Abilify, Risperdal, Seroquel in the past.  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   Pseudoseizures    questioned after normal EEG 08/2011, staging "seizure" to obtain narcotic pain meds   Seizures (HCC)    Traumatic brain injury [facility](HCC)     Past Surgical History:  Procedure Laterality Date   BRAIN SURGERY     partial frontal lobectomy   LOBECTOMY     right arm growth plate     Family History:  Family History  Problem Relation Age of Onset   COPD Mother    Coronary artery disease Mother    Stroke Mother    Diabetes Mother    Diabetes Father    Family Psychiatric  History:  Denies any known family history of psychiatric illness or suicide attempt   Social History:  Social History   Substance and Sexual Activity  Alcohol Use No     Social History   Substance and Sexual Activity  Drug Use Yes   Types: Marijuana, Methamphetamines    Social History   Socioeconomic History   Marital status: Single    Spouse name: Not on file   Number of children: Not on file   Years of education: Not on file   Highest education level: Not on file  Occupational History   Not on file  Tobacco Use   Smoking status: Every Day    Packs/day: 1.00    Types: Cigarettes   Smokeless tobacco: Not on file  Substance and Sexual Activity   Alcohol use: No   Drug use: Yes    Types: Marijuana, Methamphetamines   Sexual activity: Yes    Birth control/protection: Condom  Other Topics Concern   Not on file  Social History Narrative   ** Merged History Encounter **       Social Determinants of Health   Financial Resource Strain: Not on file  Food  Insecurity: No Food Insecurity (07/09/2022)   Hunger Vital Sign    Worried About Running Out of Food in the Last Year: Never true    Ran Out of Food in the Last Year: Never true  Transportation Needs: No Transportation Needs (07/09/2022)   PRAPARE - Administrator, Civil Service (Medical): No    Lack of Transportation (Non-Medical): No  Physical Activity: Not on file  Stress: Not on file  Social Connections: Not on file   Additional Social History:                         Sleep: nightmares  Appetite:  Fair  Current Medications: Current Facility-Administered Medications  Medication Dose Route Frequency Provider Last Rate Last Admin   acetaminophen (TYLENOL) tablet 650 mg  650 mg Oral Q6H PRN White, Patrice L, NP       alum & mag hydroxide-simeth (MAALOX/MYLANTA) 200-200-20 MG/5ML suspension 30 mL  30 mL Oral Q4H PRN White, Patrice L, NP       [START ON 07/15/2022] amLODipine (NORVASC) tablet 10 mg  10 mg Oral Daily Mackena Plummer, MD       cloNIDine (CATAPRES) tablet 0.1 mg  0.1 mg Oral Q4H PRN Yannet Rincon, MD   0.1 mg at 07/14/22 1305   FLUoxetine (PROZAC) capsule 40 mg  40 mg Oral Daily Amyia Lodwick, Harrold Donath, MD   40 mg at 07/14/22 0819   hydrOXYzine (ATARAX) tablet 25 mg  25 mg Oral TID PRN White, Patrice L, NP   25 mg at 07/14/22 1305   hydrOXYzine (ATARAX) tablet 50 mg  50 mg Oral Seabron Spates, MD   50 mg at 07/13/22 2128   lamoTRIgine (LAMICTAL) tablet 25 mg  25 mg Oral Daily Peony Barner, Harrold Donath, MD   25 mg at 07/14/22 0818   magnesium hydroxide (MILK OF MAGNESIA) suspension 30 mL  30 mL Oral Daily PRN White, Patrice L, NP       melatonin tablet 5 mg  5 mg Oral QHS Park Pope, MD   5 mg at 07/13/22 2128   nicotine (NICODERM CQ - dosed in mg/24 hours) patch 14 mg  14 mg Transdermal Daily Damani Rando, MD       prazosin (MINIPRESS) capsule 1 mg  1 mg Oral QHS Calhoun Reichardt, MD        Lab Results: No results found for this or any previous  visit (from the past 48 hour(s)).  Blood Alcohol level:  Lab Results  Component Value Date   [facility] <11 11/29/2012   ETH <11 11/22/2012    Metabolic Disorder Labs: Lab Results  Component Value Date   HGBA1C 5.5 07/09/2022   MPG 111 07/09/2022   No results found for: "PROLACTIN" Lab Results  Component Value Date   CHOL 168 07/09/2022   TRIG 138 07/09/2022   HDL  31 (L) 07/09/2022   CHOLHDL 5.4 07/09/2022   VLDL 28 07/09/2022   LDLCALC 109 (H) 07/09/2022    Physical Findings: AIMS:  , ,  ,  ,    CIWA:    COWS:     Musculoskeletal: Strength & Muscle Tone: within normal limits Gait & Station: normal Patient leans: N/A  Psychiatric Specialty Exam:  Presentation  General Appearance:  Casual  Eye Contact: Good  Speech: Clear and Coherent; Normal Rate  Speech Volume: Normal  Handedness: Right   Mood and Affect  Mood: Dysphoric; Anxious  Affect: Congruent   Thought Process  Thought Processes: Linear  Descriptions of Associations:Intact  Orientation:Full (Time, Place and Person)  Thought Content:Logical  History of Schizophrenia/Schizoaffective disorder:No data recordedno Duration of Psychotic Symptoms:No data recordedno Hallucinations:Hallucinations: None  Ideas of Reference:None  Suicidal Thoughts:Suicidal Thoughts: No  Homicidal Thoughts:Homicidal Thoughts: No   Sensorium  Memory: Immediate Good; Recent Good; Remote Good  Judgment: Fair  Insight: Fair   Chartered certified accountant: Fair  Attention Span: Fair  Recall: [employer] of Knowledge: Fair  Language: Fair   Psychomotor Activity  Psychomotor Activity: Psychomotor Activity: Normal   Assets  Assets: Communication Skills; Desire for Improvement; Financial Resources/Insurance; Leisure Time; Physical Health   Sleep  Sleep: Sleep: Fair nightmares   Physical Exam: Physical Exam Vitals reviewed.  Neurological:     Motor: No weakness.      Gait: Gait normal.    Review of Systems  Psychiatric/Behavioral:  Positive for depression and substance abuse. Negative for hallucinations and suicidal ideas. The patient is nervous/anxious. The patient does not have insomnia.    Blood pressure (!) 146/92, pulse 75, temperature 98.5 F (36.9 C), temperature source Oral, resp. rate 14, height 5\' 10"  (1.778 m), weight 118.4 kg, SpO2 97 %. Body mass index is 37.45 kg/m.   Treatment Plan Summary: Daily contact with patient to assess and evaluate symptoms and progress in treatment and Medication management   MDD severe recurrent without psychotic features GAD Stimulant use disorder Cannabis use disorder TBI History of epilepsy   PLAN: Safety and Monitoring:             --  Voluntary admission to inpatient psychiatric unit for safety, stabilization and treatment             -- Daily contact with patient to assess and evaluate symptoms and progress in treatment             -- Patient's case to be discussed in multi-disciplinary team meeting             -- Observation Level : q15 minute checks             -- Vital signs:  q12 hours             -- Precautions: suicide, elopement, and assault   2. Psychiatric Diagnoses and Treatment:  Major Depressive Disorder, Recurrent episode, severe w/o psychosis Stimulant Use Disorder  Cannabis Use Disorder TBI Remote Hx of epilepsy not on AED -Continue fluoxetine 40 mg once daily - for MDD and GAD -Continue lamotrigine 25 mg once daily for MDD and history of epilepsy.  -Patient reports having rash on this medication in 2009, but neurologist put him back on this medication successfully for 10 more years without any rash or other side effects.   -Monitor for any dermatologic side effects to this medication. -Continue Hydroxyzine 25 mg tid prn for anxiety -Discontinued trazodone as patient reports "extremely vivid  dreams" -Continue vistaril 50 mg qhs for sleep -Continue melatonin 5 mg daily -  Start prazosin 1 mg qhs for nightmares  - Encouraged patient to participate in unit milieu and in scheduled group therapies               3. Medical Issues Being Addressed:              Tobacco Use Disorder             -- Nicotine patch 14 mg/24 hours ordered             -- Smoking cessation encouraged   HTN -incr norvasc to 10 mg daily -start PRN clonidine 0.1 mg   4. Discharge Planning:              -- Social work and case management to assist with discharge planning and identification of hospital follow-up needs prior to discharge             -- Estimated LOS: 5-7 days             -- Discharge Concerns: Need to establish a safety plan; Medication compliance and effectiveness             -- Discharge Goals: Return home with outpatient referrals for mental health follow-up including medication management/psychotherapy   Cristy Hilts, MD 07/14/2022, 8:33 PM  Total Time Spent in Direct Patient Care:  I personally spent 35 minutes on the unit in direct patient care. The direct patient care time included face-to-face time with the patient, reviewing the patient's chart, communicating with other professionals, and coordinating care. Greater than 50% of this time was spent in counseling or coordinating care with the patient regarding goals of hospitalization, psycho-education, and discharge planning needs.   Phineas Inches, MD Psychiatrist

## 2022-07-14 NOTE — Progress Notes (Signed)
Follow-up elevated BP, patient reports anxiety 5 out of 10. PRN clonidine and Hydroxyzine given  per MAR.

## 2022-07-14 NOTE — Progress Notes (Signed)
Adult Psychoeducational Group Note  Date:  07/14/2022 Time:  9:48 PM  Group Topic/Focus:  Wrap-Up Group:   The focus of this group is to help patients review their daily goal of treatment and discuss progress on daily workbooks.  Participation Level:  Active  Participation Quality:  Appropriate  Affect:  Appropriate  Cognitive:  Appropriate  Insight: Appropriate  Engagement in Group:  Engaged  Modes of Intervention:  Discussion  Additional Comments:  Arthur Singleton said his day was 8. His goal prepare for discharge  and he achieved his goal. Coping skills breathing .metitation  [name] 07/14/2022, 9:48 PM

## 2022-07-14 NOTE — Progress Notes (Deleted)
   07/14/22 0500  Sleep  Number of Hours 1

## 2022-07-14 NOTE — Progress Notes (Signed)
Elevated BP, Amlodipine now, administered per MD and MAR.Marland Kitchen

## 2022-07-14 NOTE — Plan of Care (Signed)
  Problem: Education: Goal: Mental status will improve Outcome: Progressing   Problem: Safety: Goal: Periods of time without injury will increase Outcome: Progressing   Problem: Coping: Goal: Coping ability will improve Outcome: Progressing   Problem: Safety: Goal: Ability to identify and utilize support systems that promote safety will improve Outcome: Progressing   Problem: Activity: Goal: Sleeping patterns will improve Outcome: Progressing

## 2022-07-14 NOTE — Group Note (Signed)
LCSW Group Therapy Note   Group Date: 07/14/2022 Start Time: 1100 End Time: 1200  Type of Therapy and Topic:  Group Therapy:  Healthy and Unhealthy Supports   Participation Level:  Did not attend    Description of Group:  Patients in this group were introduced to the idea of adding a variety of healthy supports to address the various needs in their lives. Patients discussed what additional healthy supports could be helpful in their recovery and wellness after discharge in order to prevent future hospitalizations.   An emphasis was placed on using counselor, doctor, therapy groups, 12-step groups, and problem-specific support groups to expand supports.  They also worked as a group on developing a specific plan for several patients to deal with unhealthy supports through boundary-setting, psychoeducation with loved ones, and even termination of relationships.   Therapeutic Goals:               1)  discuss importance of adding supports to stay well once out of the hospital             2)  compare healthy versus unhealthy supports and identify some examples of each             3)  generate ideas and descriptions of healthy supports that can be added             4)  offer mutual support about how to address unhealthy supports             5)  encourage active participation in and adherence to discharge plan               Summary of Patient Progress:  Did not attend     Therapeutic Modalities:   Motivational Interviewing Brief Solution-Focused Therapy  Aram Beecham, LCSWA 07/14/2022  1:06 PM

## 2022-07-15 ENCOUNTER — Encounter (HOSPITAL_COMMUNITY): Payer: Self-pay

## 2022-07-15 DIAGNOSIS — F332 Major depressive disorder, recurrent severe without psychotic features: Secondary | ICD-10-CM

## 2022-07-15 MED ORDER — FLUOXETINE HCL 40 MG PO CAPS: 40.0000 mg | ORAL_CAPSULE | Freq: Every day | ORAL | 0 refills | Status: DC

## 2022-07-15 MED ORDER — LAMOTRIGINE 25 MG PO TABS: 25.0000 mg | ORAL_TABLET | Freq: Every day | ORAL | 0 refills | Status: DC

## 2022-07-15 MED ORDER — AMLODIPINE BESYLATE 10 MG PO TABS: 10.0000 mg | ORAL_TABLET | Freq: Every day | ORAL | 0 refills | Status: AC

## 2022-07-15 MED ORDER — PRAZOSIN HCL 1 MG PO CAPS: 1.0000 mg | ORAL_CAPSULE | Freq: Every day | ORAL | 0 refills | Status: DC

## 2022-07-15 MED ORDER — HYDROXYZINE HCL 50 MG PO TABS: 50.0000 mg | ORAL_TABLET | Freq: Every day | ORAL | 0 refills | Status: DC

## 2022-07-15 MED ORDER — NICOTINE 14 MG/24HR TD PT24: 14.0000 mg | MEDICATED_PATCH | Freq: Every day | TRANSDERMAL | 0 refills | Status: DC

## 2022-07-15 MED ORDER — MELATONIN 5 MG PO TABS: 5.0000 mg | ORAL_TABLET | Freq: Every day | ORAL | 0 refills | Status: AC

## 2022-07-15 NOTE — BH IP Treatment Plan (Signed)
Interdisciplinary Treatment and Diagnostic Plan Update  07/15/2022 Time of Session: 10:15am  Arthur Singleton MRN: [id number]  Principal Diagnosis: MDD (major depressive disorder), recurrent severe, without psychosis (HCC)  Secondary Diagnoses: Principal Problem:   MDD (major depressive disorder), recurrent severe, without psychosis (HCC) Active Problems:   Generalized anxiety disorder   TBI (traumatic brain injury) (HCC)   Stimulant use disorder   Cannabis use disorder   HTN (hypertension)   Current Medications:  Current Facility-Administered Medications  Medication Dose Route Frequency Provider Last Rate Last Admin   acetaminophen (TYLENOL) tablet 650 mg  650 mg Oral Q6H PRN White, Patrice L, NP       alum & mag hydroxide-simeth (MAALOX/MYLANTA) 200-200-20 MG/5ML suspension 30 mL  30 mL Oral Q4H PRN White, Patrice L, NP       amLODipine (NORVASC) tablet 10 mg  10 mg Oral Daily Massengill, Nathan, MD   10 mg at 07/15/22 0843   cloNIDine (CATAPRES) tablet 0.1 mg  0.1 mg Oral Q4H PRN Massengill, Harrold Donath, MD   0.1 mg at 07/14/22 1305   FLUoxetine (PROZAC) capsule 40 mg  40 mg Oral Daily Massengill, Harrold Donath, MD   40 mg at 07/15/22 0843   hydrOXYzine (ATARAX) tablet 25 mg  25 mg Oral TID PRN White, Patrice L, NP   25 mg at 07/15/22 0845   hydrOXYzine (ATARAX) tablet 50 mg  50 mg Oral Seabron Spates, MD   50 mg at 07/14/22 2105   lamoTRIgine (LAMICTAL) tablet 25 mg  25 mg Oral Daily Massengill, Harrold Donath, MD   25 mg at 07/15/22 0843   magnesium hydroxide (MILK OF MAGNESIA) suspension 30 mL  30 mL Oral Daily PRN White, Patrice L, NP       melatonin tablet 5 mg  5 mg Oral QHS Park Pope, MD   5 mg at 07/14/22 2105   nicotine (NICODERM CQ - dosed in mg/24 hours) patch 14 mg  14 mg Transdermal Daily Massengill, Nathan, MD       prazosin (MINIPRESS) capsule 1 mg  1 mg Oral QHS Massengill, Nathan, MD   1 mg at 07/14/22 2105   Current Outpatient Medications  Medication Sig Dispense Refill    [START ON 07/16/2022] amLODipine (NORVASC) 10 MG tablet Take 1 tablet (10 mg total) by mouth daily. 30 tablet 0   [START ON 07/16/2022] FLUoxetine (PROZAC) 40 MG capsule Take 1 capsule (40 mg total) by mouth daily. 30 capsule 0   hydrOXYzine (ATARAX) 50 MG tablet Take 1 tablet (50 mg total) by mouth at bedtime. 30 tablet 0   [START ON 07/16/2022] lamoTRIgine (LAMICTAL) 25 MG tablet Take 1 tablet (25 mg total) by mouth daily. 30 tablet 0   melatonin 5 MG TABS Take 1 tablet (5 mg total) by mouth at bedtime.  0   [START ON 07/16/2022] nicotine (NICODERM CQ - DOSED IN MG/24 HOURS) 14 mg/24hr patch Place 1 patch (14 mg total) onto the skin daily. 28 patch 0   prazosin (MINIPRESS) 1 MG capsule Take 1 capsule (1 mg total) by mouth at bedtime. 30 capsule 0   PTA Medications: No medications prior to admission.    Patient Stressors: Medication change or noncompliance    Patient Strengths: Ability for insight  Financial means   Treatment Modalities: Medication Management, Group therapy, Case management,  1 to 1 session with clinician, Psychoeducation, Recreational therapy.   Physician Treatment Plan for Primary Diagnosis: MDD (major depressive disorder), recurrent severe, without psychosis (HCC) Long Term Goal(s): Improvement in  symptoms so as ready for discharge   Short Term Goals: Ability to identify changes in lifestyle to reduce recurrence of condition will improve Ability to verbalize feelings will improve Ability to disclose and discuss suicidal ideas Ability to demonstrate self-control will improve Ability to identify and develop effective coping behaviors will improve Ability to maintain clinical measurements within normal limits will improve Compliance with prescribed medications will improve Ability to identify triggers associated with substance abuse/mental health issues will improve  Medication Management: Evaluate patient's response, side effects, and tolerance of medication  regimen.  Therapeutic Interventions: 1 to 1 sessions, Unit Group sessions and Medication administration.  Evaluation of Outcomes: Adequate for Discharge  Physician Treatment Plan for Secondary Diagnosis: Principal Problem:   MDD (major depressive disorder), recurrent severe, without psychosis (HCC) Active Problems:   Generalized anxiety disorder   TBI (traumatic brain injury) (HCC)   Stimulant use disorder   Cannabis use disorder   HTN (hypertension)  Long Term Goal(s): Improvement in symptoms so as ready for discharge   Short Term Goals: Ability to identify changes in lifestyle to reduce recurrence of condition will improve Ability to verbalize feelings will improve Ability to disclose and discuss suicidal ideas Ability to demonstrate self-control will improve Ability to identify and develop effective coping behaviors will improve Ability to maintain clinical measurements within normal limits will improve Compliance with prescribed medications will improve Ability to identify triggers associated with substance abuse/mental health issues will improve     Medication Management: Evaluate patient's response, side effects, and tolerance of medication regimen.  Therapeutic Interventions: 1 to 1 sessions, Unit Group sessions and Medication administration.  Evaluation of Outcomes: Adequate for Discharge   RN Treatment Plan for Primary Diagnosis: MDD (major depressive disorder), recurrent severe, without psychosis (HCC) Long Term Goal(s): Knowledge of disease and therapeutic regimen to maintain health will improve  Short Term Goals: Ability to remain free from injury will improve, Ability to participate in decision making will improve, Ability to verbalize feelings will improve, Ability to disclose and discuss suicidal ideas, and Ability to identify and develop effective coping behaviors will improve  Medication Management: RN will administer medications as ordered by provider, will  assess and evaluate patient's response and provide education to patient for prescribed medication. RN will report any adverse and/or side effects to prescribing provider.  Therapeutic Interventions: 1 on 1 counseling sessions, Psychoeducation, Medication administration, Evaluate responses to treatment, Monitor vital signs and CBGs as ordered, Perform/monitor CIWA, COWS, AIMS and Fall Risk screenings as ordered, Perform wound care treatments as ordered.  Evaluation of Outcomes: Adequate for Discharge   LCSW Treatment Plan for Primary Diagnosis: MDD (major depressive disorder), recurrent severe, without psychosis (HCC) Long Term Goal(s): Safe transition to appropriate next level of care at discharge, Engage patient in therapeutic group addressing interpersonal concerns.  Short Term Goals: Engage patient in aftercare planning with referrals and resources, Increase social support, Increase emotional regulation, Facilitate acceptance of mental health diagnosis and concerns, Identify triggers associated with mental health/substance abuse issues, and Increase skills for wellness and recovery  Therapeutic Interventions: Assess for all discharge needs, 1 to 1 time with Social worker, Explore available resources and support systems, Assess for adequacy in community support network, Educate family and significant other(s) on suicide prevention, Complete Psychosocial Assessment, Interpersonal group therapy.  Evaluation of Outcomes: Adequate for Discharge   Progress in Treatment: Attending groups: Yes. Participating in groups: Yes. Taking medication as prescribed: Yes. Toleration medication: Yes. Family/Significant other contact made: No, will contact:  Patient declined  Patient understands diagnosis: Yes. Discussing patient identified problems/goals with staff: Yes. Medical problems stabilized or resolved: Yes. Denies suicidal/homicidal ideation: Yes. Issues/concerns per patient self-inventory: No.      New problem(s) identified: No, Describe:  None reported   New Short Term/Long Term Goal(s):medication stabilization, elimination of SI thoughts, development of comprehensive mental wellness plan.     Patient Goals:  "be on medication and have his mood civilized"    Discharge Plan or Barriers: Patient recently admitted. CSW will continue to follow and assess for appropriate referrals and possible discharge planning.     Reason for Continuation of Hospitalization:  Medication stabilization    Estimated Length of Stay: Adequate for Discharge    [place] Suicide Severity Risk Score: Flowsheet Row Admission (Discharged) from 07/09/2022 in [facility]  C-SSRS RISK CATEGORY Low Risk       Last PHQ 2/9 Scores:     No data to display          Scribe for Treatment Team: Darleen Crocker, Latanya Presser 07/15/2022 2:09 PM

## 2022-07-15 NOTE — Discharge Summary (Signed)
Physician Discharge Summary Note  Patient:  Arthur Singleton is an 36 y.o., male MRN:  [id number] DOB:  [birth year] Patient phone:  [phone] (home)  Patient address:   [address] [place] [address],  Total Time spent with patient: 15 minutes  Date of Admission:  07/09/2022 Date of Discharge: 07-15-2022  Reason for Admission:    Patient is a 36 year old male with a past psychiatric history of major depressive disorder and generalized anxiety disorder; neurological history of TBI and epilepsy; is admitted to the psychiatric hospital from the behavioral health urgent care center for evaluation and treatment of worsening depression and suicidal thoughts with plan to overdose on medication.   Principal Problem: MDD (major depressive disorder), recurrent severe, without psychosis (HCC) Discharge Diagnoses: Principal Problem:   MDD (major depressive disorder), recurrent severe, without psychosis (HCC) Active Problems:   Generalized anxiety disorder   TBI (traumatic brain injury) (HCC)   Stimulant use disorder   Cannabis use disorder   HTN (hypertension)   Past Psychiatric History:  Previous diagnoses of MDD and GAD.  Reports a history of at least 3 psychiatric hospitalization for mental health, most recent was 10 years ago.  Reports at least 2 suicide attempts by overdose.  No current psychiatric medication.  Psychiatric medication history: Most recently taking Prozac and lamotrigine, stopped 3 years ago.  Reports having a rash on lamotrigine in 2009, but this was restarted by a neurologist, and he took the lamotrigine for 10 years thereafter without any side effects or rash. Patient also reports taking Zoloft, Lexapro, Celexa, Paxil, Effexor, lithium, Depakote, Trileptal, Abilify, Risperdal, Seroquel in the past.   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   Pseudoseizures    questioned after normal EEG 08/2011, staging "seizure" to obtain narcotic pain meds   Seizures (HCC)    Traumatic brain injury [facility])     Past Surgical History:  Procedure Laterality Date   BRAIN SURGERY     partial frontal lobectomy   LOBECTOMY     right arm growth plate     Family History:  Family History  Problem Relation Age of Onset   COPD Mother    Coronary artery disease Mother    Stroke Mother    Diabetes Mother    Diabetes Father    Family Psychiatric  History:  Denies any known family history of psychiatric illness or suicide attempt    Social History:  Social History   Substance and Sexual Activity  Alcohol Use No     Social History   Substance and Sexual Activity  Drug Use Yes   Types: Marijuana, Methamphetamines    Social History   Socioeconomic History   Marital status: Single    Spouse name: Not on file   Number of children: Not on file   Years of education: Not on file   Highest education level: Not on file  Occupational History   Not on file  Tobacco Use   Smoking status: Every Day    Packs/day: 1.00    Types: Cigarettes   Smokeless tobacco: Not on file  Substance and Sexual Activity  Alcohol use: No   Drug use: Yes    Types: Marijuana, Methamphetamines   Sexual activity: Yes    Birth control/protection: Condom  Other Topics Concern   Not on file  Social History Narrative   ** Merged History Encounter **       Social Determinants of Health   Financial Resource Strain: Not on file  Food Insecurity: No Food Insecurity (07/09/2022)   Hunger Vital Sign    Worried About Running Out of Food in the Last Year: Never true    Ran Out of Food in the Last Year: Never true  Transportation Needs: No Transportation Needs (07/09/2022)   PRAPARE -  Administrator, Civil Service (Medical): No    Lack of Transportation (Non-Medical): No  Physical Activity: Not on file  Stress: Not on file  Social Connections: Not on file    Hospital Course:    HOSPITAL COURSE:  During the patient's hospitalization, patient had extensive initial psychiatric evaluation, and follow-up psychiatric evaluations every day.  Psychiatric diagnoses provided upon initial assessment:  MDD severe recurrent without psychotic features GAD Stimulant use disorder Cannabis use disorder TBI History of epilepsy  Patient's psychiatric medications were adjusted on admission:  -Start fluoxetine 10 mg once daily for MDD and GAD, and increase to 20 mg once daily tomorrow -Start lamotrigine 25 mg once daily for MDD and history of epilepsy.  Patient reports having rash on this medication in 2009, but neurologist put him back on this medication successfully for 10 more years without any rash or other side effects.  Monitor for any dermatologic side effects to this medication. -Start trazodone 50 mg nightly as needed and hydroxyzine 25 mg 3 times daily as needed  During the hospitalization, other adjustments were made to the patient's psychiatric medication regimen:  -trazodone dc -hydroxyzine 50 mg qhs scheduled was added for insomnia -melatonin 5 mg qhs was added for insomnia -prozac was increased to 40 mg once daily -lamictal was continued at 25 mg once daily. Denies rashes, skin changes, mucosal sores -prazosin was started for nightmares -norvasc was started and incr to 10 mg once daily for htn   Patient's care was discussed during the interdisciplinary team meeting every day during the hospitalization.  The patient denied having side effects to prescribed psychiatric medication.  Gradually, patient started adjusting to milieu. The patient was evaluated each day by a clinical provider to ascertain response to treatment. Improvement was noted by the  patient's report of decreasing symptoms, improved sleep and appetite, affect, medication tolerance, behavior, and participation in unit programming.  Patient was asked each day to complete a self inventory noting mood, mental status, pain, new symptoms, anxiety and concerns.    Symptoms were reported as significantly decreased or resolved completely by discharge.   On day of discharge, the patient reports that their mood is stable. The patient denied having suicidal thoughts for more than 48 hours prior to discharge.  Patient denies having homicidal thoughts.  Patient denies having auditory hallucinations.  Patient denies any visual hallucinations or other symptoms of psychosis. The patient was motivated to continue taking medication with a goal of continued improvement in mental health.   The patient reports their target psychiatric symptoms of depression and suicidal thoughts, all responded well to the psychiatric medications, and the patient reports overall benefit other psychiatric hospitalization. Supportive psychotherapy was provided to the patient. The patient also participated in regular group therapy while hospitalized. Coping skills, problem solving as well as relaxation  therapies were also part of the unit programming.  Labs were reviewed with the patient, and abnormal results were discussed with the patient.  The patient is able to verbalize their individual safety plan to this provider.  # It is recommended to the patient to continue psychiatric medications as prescribed, after discharge from the hospital.    # It is recommended to the patient to follow up with your outpatient psychiatric provider and PCP.  # It was discussed with the patient, the impact of alcohol, drugs, tobacco have been there overall psychiatric and medical wellbeing, and total abstinence from substance use was recommended the patient.ed.  # Prescriptions provided or sent directly to preferred pharmacy at  discharge. Patient agreeable to plan. Given opportunity to ask questions. Appears to feel comfortable with discharge.    # In the event of worsening symptoms, the patient is instructed to call the crisis hotline, 911 and or go to the nearest ED for appropriate evaluation and treatment of symptoms. To follow-up with primary care provider for other medical issues, concerns and or health care needs  # Patient was discharged to home, with a plan to follow up as noted below.   Physical Findings: AIMS:  , ,  ,  ,    CIWA:    COWS:     Musculoskeletal: Strength & Muscle Tone: within normal limits Gait & Station: normal Patient leans: N/A   Psychiatric Specialty Exam:  Presentation  General Appearance:  Casual  Eye Contact: Good  Speech: Normal Rate  Speech Volume: Normal  Handedness: Right   Mood and Affect  Mood: Euthymic  Affect: Congruent; Full Range   Thought Process  Thought Processes: Linear  Descriptions of Associations:Intact  Orientation:Full (Time, Place and Person)  Thought Content:Logical  History of Schizophrenia/Schizoaffective disorder:No data recorded Duration of Psychotic Symptoms:No data recorded Hallucinations:Hallucinations: None  Ideas of Reference:None  Suicidal Thoughts:Suicidal Thoughts: No  Homicidal Thoughts:Homicidal Thoughts: No   Sensorium  Memory: Immediate Good; Recent Good; Remote Good  Judgment: Fair  Insight: Fair   Chartered certified accountant: Fair  Attention Span: Fair  Recall: [employer] of Knowledge: Fair  Language: Fair   Psychomotor Activity  Psychomotor Activity: Psychomotor Activity: Normal   Assets  Assets: Communication Skills; Desire for Improvement; Financial Resources/Insurance; Leisure Time; Physical Health   Sleep  Sleep: Sleep: Good    Physical Exam: Physical Exam Vitals reviewed.  Neurological:     Motor: No weakness.     Gait: Gait normal.   Psychiatric:        Mood and Affect: Mood normal.        Behavior: Behavior normal.        Thought Content: Thought content normal.        Judgment: Judgment normal.    Review of Systems  Constitutional:  Negative for chills and fever.  Cardiovascular:  Negative for chest pain and palpitations.  Neurological:  Negative for dizziness, tingling, tremors and headaches.  Psychiatric/Behavioral:  Negative for depression, hallucinations, memory loss, substance abuse and suicidal ideas. The patient is not nervous/anxious and does not have insomnia.   All other systems reviewed and are negative.  Blood pressure 129/87, pulse 92, temperature 98.5 F (36.9 C), temperature source Oral, resp. rate 16, height 5\' 10"  (1.778 m), weight 118.4 kg, SpO2 98 %. Body mass index is 37.45 kg/m.   Social History   Tobacco Use  Smoking Status Every Day   Packs/day: 1.00   Types: Cigarettes  Smokeless Tobacco Not  on file   Tobacco Cessation:  A prescription for an FDA-approved tobacco cessation medication provided at discharge   Blood Alcohol level:  Lab Results  Component Value Date   [facility]2012   ETH <11 11/22/2012    Metabolic Disorder Labs:  Lab Results  Component Value Date   HGBA1C 5.5 07/09/2022   MPG 111 07/09/2022   No results found for: "PROLACTIN" Lab Results  Component Value Date   CHOL 168 07/09/2022   TRIG 138 07/09/2022   HDL 31 (L) 07/09/2022   CHOLHDL 5.4 07/09/2022   VLDL 28 07/09/2022   LDLCALC 109 (H) 07/09/2022    See Psychiatric Specialty Exam and Suicide Risk Assessment completed by Attending Physician prior to discharge.  Discharge destination:  Home  Is patient on multiple antipsychotic therapies at discharge:  No   Has Patient had three or more failed trials of antipsychotic monotherapy by history:  No  Recommended Plan for Multiple Antipsychotic Therapies: NA  Discharge Instructions     Diet - low sodium heart healthy   Complete by: As  directed    Increase activity slowly   Complete by: As directed       Allergies as of 07/15/2022       Reactions   Fish Allergy Anaphylaxis   Iodine Anaphylaxis, Swelling, Other (See Comments)   Pt states that he gets swelling of his throat and trouble breathing with the contrast. States that he has never been premedicated before. 08/13/16 jzp   Other Shortness Of Breath, Other (See Comments)   Succinylcholine - Pulmonary edema Gadolinium - Anaphylaxis   Shellfish Allergy Anaphylaxis, Swelling   Nitrous Oxide Other (See Comments)   Pulmonary edema        Medication List     TAKE these medications      Indication  amLODipine 10 MG tablet Commonly known as: NORVASC Take 1 tablet (10 mg total) by mouth daily. Start taking on: July 16, 2022  Indication: High Blood Pressure Disorder   FLUoxetine 40 MG capsule Commonly known as: PROZAC Take 1 capsule (40 mg total) by mouth daily. Start taking on: July 16, 2022  Indication: Depression   hydrOXYzine 50 MG tablet Commonly known as: ATARAX Take 1 tablet (50 mg total) by mouth at bedtime.  Indication: Feeling Anxious, insomnia   lamoTRIgine 25 MG tablet Commonly known as: LAMICTAL Take 1 tablet (25 mg total) by mouth daily. Start taking on: July 16, 2022  Indication: depression   melatonin 5 MG Tabs Take 1 tablet (5 mg total) by mouth at bedtime.  Indication: Trouble Sleeping   nicotine 14 mg/24hr patch Commonly known as: NICODERM CQ - dosed in mg/24 hours Place 1 patch (14 mg total) onto the skin daily. Start taking on: July 16, 2022  Indication: Nicotine Addiction   prazosin 1 MG capsule Commonly known as: MINIPRESS Take 1 capsule (1 mg total) by mouth at bedtime.  Indication: Frightening Dreams        Follow-up Information     [facility].   Why: You have an appointment for therapy services on  08/18/22 at 6:00 pm.  This will be a  Virtual telehealth appointment. Contact information: [address], [place], [place] [address] [phone]         [facility] Follow up on 08/07/2022.   Why: You have an appointment for medication management services on 08/07/22 at 4:10 pm.  This appointment will be  Virtual telehealth. Contact information: [address] [place] [address] [phone]                 Follow-up recommendations:    Activity: as tolerated  Diet: heart healthy  Other: -Follow-up with your outpatient psychiatric provider -instructions on appointment date, time, and address (location) are provided to you in discharge paperwork.  -Take your psychiatric medications as prescribed at discharge - instructions are provided to you in the discharge paperwork  -Follow-up with outpatient primary care doctor and other specialists -for management of preventative medicine and chronic medical disease, including: HTN, h/o epilepsy, h/o tbi   -Testing: Follow-up with outpatient provider for abnormal lab results:   -Recommend abstinence from alcohol, tobacco, and other illicit drug use at discharge.   -If your psychiatric symptoms recur, worsen, or if you have side effects to your psychiatric medications, call your outpatient psychiatric provider, 911, [phone] or go to the nearest emergency department.  -If suicidal thoughts recur, call your outpatient psychiatric provider, 911, [phone] or go to the nearest emergency department.   Signed: Cristy Hilts, MD 07/15/2022, 9:51 AM  Total Time Spent in Direct Patient Care:  I personally spent 35 minutes on the unit in direct patient care. The direct patient care time included face-to-face time with the patient, reviewing the patient's chart, communicating with other professionals, and coordinating care. Greater than 50% of this time was spent in counseling or coordinating care with the patient regarding goals of  hospitalization, psycho-education, and discharge planning needs.   Phineas Inches, MD Psychiatrist

## 2022-07-15 NOTE — Progress Notes (Signed)
   07/14/22 2000  Psych Admission Type (Psych Patients Only)  Admission Status Voluntary  Psychosocial Assessment  Patient Complaints Anxiety  Eye Contact Fair  Facial Expression Flat  Affect Appropriate to circumstance  Speech Logical/coherent  Interaction Assertive  Motor Activity Slow  Appearance/Hygiene Unremarkable  Behavior Characteristics Appropriate to situation;Cooperative  Mood Anxious  Thought Process  Coherency WDL  Content WDL  Delusions None reported or observed  Perception WDL  Hallucination None reported or observed  Judgment Limited  Confusion None  Danger to Self  Current suicidal ideation? Denies (Denies)  Agreement Not to Harm Self Yes  Description of Agreement verbal

## 2022-07-15 NOTE — BHH Suicide Risk Assessment (Signed)
[facility] Discharge Suicide Risk Assessment   Principal Problem: MDD (major depressive disorder), recurrent severe, without psychosis ([place]) Discharge Diagnoses: Principal Problem:   MDD (major depressive disorder), recurrent severe, without psychosis ([place]) Active Problems:   Generalized anxiety disorder   TBI (traumatic brain injury) ([place])   Stimulant use disorder   Cannabis use disorder   HTN (hypertension)   Total Time spent with patient: 15 minutes  Patient is a 36 year old male with a past psychiatric history of major depressive disorder and generalized anxiety disorder; neurological history of TBI and epilepsy; is admitted to the psychiatric hospital from the behavioral health urgent care center for evaluation and treatment of worsening depression and suicidal thoughts with plan to overdose on medication.      During the patient's hospitalization, patient had extensive initial psychiatric evaluation, and follow-up psychiatric evaluations every day.   Psychiatric diagnoses provided upon initial assessment:  MDD severe recurrent without psychotic features GAD Stimulant use disorder Cannabis use disorder TBI History of epilepsy   Patient's psychiatric medications were adjusted on admission:  -Start fluoxetine 10 mg once daily for MDD and GAD, and increase to 20 mg once daily tomorrow -Start lamotrigine 25 mg once daily for MDD and history of epilepsy.  Patient reports having rash on this medication in 2009, but neurologist put him back on this medication successfully for 10 more years without any rash or other side effects.  Monitor for any dermatologic side effects to this medication. -Start trazodone 50 mg nightly as needed and hydroxyzine 25 mg 3 times daily as needed   During the hospitalization, other adjustments were made to the patient's psychiatric medication regimen:  -trazodone dc -hydroxyzine 50 mg qhs scheduled was added for insomnia -melatonin 5 mg qhs was added for  insomnia -prozac was increased to 40 mg once daily -lamictal was continued at 25 mg once daily. Denies rashes, skin changes, mucosal sores -prazosin was started for nightmares -norvasc was started and incr to 10 mg once daily for htn    Patient's care was discussed during the interdisciplinary team meeting every day during the hospitalization.   The patient denied having side effects to prescribed psychiatric medication.   Gradually, patient started adjusting to milieu. The patient was evaluated each day by a clinical provider to ascertain response to treatment. Improvement was noted by the patient's report of decreasing symptoms, improved sleep and appetite, affect, medication tolerance, behavior, and participation in unit programming.  Patient was asked each day to complete a self inventory noting mood, mental status, pain, new symptoms, anxiety and concerns.     Symptoms were reported as significantly decreased or resolved completely by discharge.    On day of discharge, the patient reports that their mood is stable. The patient denied having suicidal thoughts for more than 48 hours prior to discharge.  Patient denies having homicidal thoughts.  Patient denies having auditory hallucinations.  Patient denies any visual hallucinations or other symptoms of psychosis. The patient was motivated to continue taking medication with a goal of continued improvement in mental health.    The patient reports their target psychiatric symptoms of depression and suicidal thoughts, all responded well to the psychiatric medications, and the patient reports overall benefit other psychiatric hospitalization. Supportive psychotherapy was provided to the patient. The patient also participated in regular group therapy while hospitalized. Coping skills, problem solving as well as relaxation therapies were also part of the unit programming.   Labs were reviewed with the patient, and abnormal results were discussed with  the patient.   The patient is able to verbalize their individual safety plan to this provider.   # It is recommended to the patient to continue psychiatric medications as prescribed, after discharge from the hospital.     # It is recommended to the patient to follow up with your outpatient psychiatric provider and PCP.   # It was discussed with the patient, the impact of alcohol, drugs, tobacco have been there overall psychiatric and medical wellbeing, and total abstinence from substance use was recommended the patient.ed.   # Prescriptions provided or sent directly to preferred pharmacy at discharge. Patient agreeable to plan. Given opportunity to ask questions. Appears to feel comfortable with discharge.    # In the event of worsening symptoms, the patient is instructed to call the crisis hotline, 911 and or go to the nearest ED for appropriate evaluation and treatment of symptoms. To follow-up with primary care provider for other medical issues, concerns and or health care needs   # Patient was discharged to home, with a plan to follow up as noted below.    Psychiatric Specialty Exam  Presentation  General Appearance:  Casual  Eye Contact: Good  Speech: Normal Rate  Speech Volume: Normal  Handedness: Right   Mood and Affect  Mood: Euthymic  Duration of Depression Symptoms: No data recorded Affect: Congruent; Full Range   Thought Process  Thought Processes: Linear  Descriptions of Associations:Intact  Orientation:Full (Time, Place and Person)  Thought Content:Logical  History of Schizophrenia/Schizoaffective disorder:No data recorded Duration of Psychotic Symptoms:No data recorded Hallucinations:Hallucinations: None  Ideas of Reference:None  Suicidal Thoughts:Suicidal Thoughts: No  Homicidal Thoughts:Homicidal Thoughts: No   Sensorium  Memory: Immediate Good; Recent Good; Remote Good  Judgment: Fair  Insight: Fair   Manufacturing systems engineer: Fair  Attention Span: Fair  Recall: [employer] of Knowledge: Fair  Language: Fair   Psychomotor Activity  Psychomotor Activity: Psychomotor Activity: Normal   Assets  Assets: Communication Skills; Desire for Improvement; Financial Resources/Insurance; Leisure Time; Physical Health   Sleep  Sleep: Sleep: Good   Physical Exam: Physical Exam See discharge summary  ROS See discharge summary  Blood pressure 129/87, pulse 92, temperature 98.5 F (36.9 C), temperature source Oral, resp. rate 16, height 5\' 10"  (1.778 m), weight 118.4 kg, SpO2 98 %. Body mass index is 37.45 kg/m.  Mental Status Per Nursing Assessment::   On Admission:  Suicidal ideation indicated by patient  Demographic factors:  Caucasian Loss Factors:  NA Historical Factors:  Prior suicide attempts Risk Reduction Factors:  Employed   Continued Clinical Symptoms:  MDD - mood is stable. Sleep is better. Denying SI.   Cognitive Features That Contribute To Risk:  None    Suicide Risk:  Mild:  There are no identifiable suicide plans, no associated intent, mild dysphoria and related symptoms, good self-control (both objective and subjective assessment), few other risk factors, and identifiable protective factors, including available and accessible social support.    [facility] Follow up on 08/18/2022.   Why: You have an appointment for therapy services on  08/18/22 at 6:00 pm.  This will be a Virtual telehealth appointment. Contact information: [address] [place], [place] [address] [phone]         [facility] Follow up on 08/07/2022.   Why: You have an appointment for medication management services on 08/07/22 at 4:10 pm.  This appointment will  be Sports administrator. Contact information: [address] [place] [address] [phone]                 Plan Of  Care/Follow-up recommendations:     Activity: as tolerated   Diet: heart healthy   Other: -Follow-up with your outpatient psychiatric provider -instructions on appointment date, time, and address (location) are provided to you in discharge paperwork.   -Take your psychiatric medications as prescribed at discharge - instructions are provided to you in the discharge paperwork   -Follow-up with outpatient primary care doctor and other specialists -for management of preventative medicine and chronic medical disease, including: HTN, h/o epilepsy, h/o tbi    -Testing: Follow-up with outpatient provider for abnormal lab results:    -Recommend abstinence from alcohol, tobacco, and other illicit drug use at discharge.    -If your psychiatric symptoms recur, worsen, or if you have side effects to your psychiatric medications, call your outpatient psychiatric provider, 911, [phone] or go to the nearest emergency department.   -If suicidal thoughts recur, call your outpatient psychiatric provider, 911, [phone] or go to the nearest emergency department.    Cristy Hilts, MD 07/15/2022, 9:56 AM

## 2022-07-15 NOTE — Group Note (Signed)
Recreation Therapy Group Note   Group Topic:Stress Management  Group Date: 07/15/2022 Start Time: 0935 End Time: 0952 Facilitators: Samin Milke-McCall, LRT,CTRS Location: [address] Dayroom   Goal Area(s) Addresses:  Patient will actively participate in stress management techniques presented during session.  Patient will successfully identify benefit of practicing stress management post d/c.   Group Description: Guided Imagery. LRT provided education, instruction, and demonstration on practice of visualization via guided imagery. Patient was asked to participate in the technique introduced during session. LRT debriefed including topics of mindfulness, stress management and specific scenarios each patient could use these techniques. Patients were given suggestions of ways to access scripts post d/c and encouraged to explore Youtube and other apps available on smartphones, tablets, and computers.   Affect/Mood: N/A   Participation Level: Did not attend    Clinical Observations/Individualized Feedback:      Plan: Continue to engage patient in RT group sessions 2-3x/week.   Ceara Wrightson-McCall, LRT,CTRS 07/15/2022 12:24 PM

## 2022-07-15 NOTE — Progress Notes (Signed)
  [facility] Adult Case Management Discharge Plan :  Will you be returning to the same living situation after discharge:  Yes,  Home  At discharge, do you have transportation home?: Yes,  Taxi to own car  Do you have the ability to pay for your medications: Yes,  Out of State Medicaid   Release of information consent forms completed and in the chart;  Patient's signature needed at discharge.  Patient to Follow up at:  Follow-up Information     [facility] Follow up on 08/18/2022.   Why: You have an appointment for therapy services on  08/18/22 at 6:00 pm.  This will be a Virtual telehealth appointment. Contact information: [address] [place], [place] [address] [phone]         [facility] Follow up on 08/07/2022.   Why: You have an appointment for medication management services on 08/07/22 at 4:10 pm.  This appointment will be Virtual telehealth. Contact information: [address] [place] [address] [phone]                 Next level of care provider has access to [facility] Link:no  Safety Planning and Suicide Prevention discussed: Yes,  with patient      Has patient been referred to the Quitline?: Patient refused referral  Patient has been referred for addiction treatment: Pt. refused referral, due to out-of-state medicaid and employment.  Provided patient with a packet that contained a list of treatment centers.   Aram Beecham, LCSWA 07/15/2022, 10:40 AM

## 2022-07-25 ENCOUNTER — Emergency Department (HOSPITAL_COMMUNITY)
Admission: EM | Admit: 2022-07-25 | Discharge: 2022-07-26 | Disposition: A | Discharge status: Other Acute Inpatient Hospital | Payer: Medicaid Other | Source: Home / Self Care | Attending: Emergency Medicine | Admitting: Emergency Medicine

## 2022-07-25 ENCOUNTER — Encounter (HOSPITAL_COMMUNITY): Payer: Self-pay

## 2022-07-25 ENCOUNTER — Emergency Department (HOSPITAL_COMMUNITY): Payer: Medicaid Other

## 2022-07-25 DIAGNOSIS — I10 Essential (primary) hypertension: Secondary | ICD-10-CM | POA: Insufficient documentation

## 2022-07-25 DIAGNOSIS — F332 Major depressive disorder, recurrent severe without psychotic features: Secondary | ICD-10-CM | POA: Insufficient documentation

## 2022-07-25 DIAGNOSIS — F17210 Nicotine dependence, cigarettes, uncomplicated: Secondary | ICD-10-CM | POA: Insufficient documentation

## 2022-07-25 DIAGNOSIS — R519 Headache, unspecified: Secondary | ICD-10-CM | POA: Insufficient documentation

## 2022-07-25 DIAGNOSIS — R739 Hyperglycemia, unspecified: Secondary | ICD-10-CM | POA: Insufficient documentation

## 2022-07-25 DIAGNOSIS — F32A Depression, unspecified: Secondary | ICD-10-CM

## 2022-07-25 DIAGNOSIS — D72829 Elevated white blood cell count, unspecified: Secondary | ICD-10-CM | POA: Insufficient documentation

## 2022-07-25 DIAGNOSIS — Z79899 Other long term (current) drug therapy: Secondary | ICD-10-CM | POA: Insufficient documentation

## 2022-07-25 DIAGNOSIS — Z20822 Contact with and (suspected) exposure to covid-19: Secondary | ICD-10-CM | POA: Insufficient documentation

## 2022-07-25 DIAGNOSIS — F411 Generalized anxiety disorder: Secondary | ICD-10-CM | POA: Insufficient documentation

## 2022-07-25 LAB — URINALYSIS, ROUTINE W REFLEX MICROSCOPIC
Bilirubin Urine: NEGATIVE
Glucose, UA: NEGATIVE mg/dL
Hgb urine dipstick: NEGATIVE
Ketones, ur: NEGATIVE mg/dL
Leukocytes,Ua: NEGATIVE
Nitrite: NEGATIVE
Protein, ur: NEGATIVE mg/dL
Specific Gravity, Urine: 1.019 (ref 1.005–1.030)
pH: 5 (ref 5.0–8.0)

## 2022-07-25 LAB — CBC
HCT: 38.4 % — ABNORMAL LOW (ref 39.0–52.0)
Hemoglobin: 13.2 g/dL (ref 13.0–17.0)
MCH: 29.8 pg (ref 26.0–34.0)
MCHC: 34.4 g/dL (ref 30.0–36.0)
MCV: 86.7 fL (ref 80.0–100.0)
Platelets: 229 10*3/uL (ref 150–400)
RBC: 4.43 MIL/uL (ref 4.22–5.81)
RDW: 13.1 % (ref 11.5–15.5)
WBC: 12.4 10*3/uL — ABNORMAL HIGH (ref 4.0–10.5)
nRBC: 0 % (ref 0.0–0.2)

## 2022-07-25 LAB — BASIC METABOLIC PANEL
Anion gap: 8 (ref 5–15)
BUN: 16 mg/dL (ref 6–20)
CO2: 23 mmol/L (ref 22–32)
Calcium: 9.2 mg/dL (ref 8.9–10.3)
Chloride: 105 mmol/L (ref 98–111)
Creatinine, Ser: 0.88 mg/dL (ref 0.61–1.24)
GFR, Estimated: >60 mL/min (ref >60)
Glucose, Bld: 137 mg/dL — ABNORMAL HIGH (ref 70–99)
Potassium: 4.1 mmol/L (ref 3.5–5.1)
Sodium: 136 mmol/L (ref 135–145)

## 2022-07-25 LAB — ACETAMINOPHEN LEVEL: Acetaminophen (Tylenol), Serum: <10 ug/mL — ABNORMAL LOW (ref 10–30)

## 2022-07-25 LAB — ETHANOL: Alcohol, Ethyl (B): <10 mg/dL (ref <10)

## 2022-07-25 LAB — SALICYLATE LEVEL: Salicylate Lvl: <7 mg/dL — ABNORMAL LOW (ref 7.0–30.0)

## 2022-07-25 MED ORDER — DIPHENHYDRAMINE HCL 50 MG/ML IJ SOLN
25.0000 mg | Freq: Once | INTRAMUSCULAR | Status: DC
  Filled 2022-07-25: qty 1

## 2022-07-25 MED ORDER — MAGNESIUM SULFATE 2 GM/50ML IV SOLN
2.0000 g | Freq: Once | INTRAVENOUS | Status: DC
  Filled 2022-07-25: qty 50

## 2022-07-25 MED ORDER — KETOROLAC TROMETHAMINE 15 MG/ML IJ SOLN
15.0000 mg | Freq: Once | INTRAMUSCULAR | Status: AC
  Administered 2022-07-25: 15 mg via INTRAMUSCULAR

## 2022-07-25 MED ORDER — SODIUM CHLORIDE 0.9 % IV BOLUS: 1000.0000 mL | Freq: Once | INTRAVENOUS | Status: DC

## 2022-07-25 MED ORDER — DIPHENHYDRAMINE HCL 50 MG/ML IJ SOLN
25.0000 mg | Freq: Once | INTRAMUSCULAR | Status: AC
  Administered 2022-07-25: 25 mg via INTRAMUSCULAR

## 2022-07-25 MED ORDER — DEXAMETHASONE SODIUM PHOSPHATE 10 MG/ML IJ SOLN
10.0000 mg | Freq: Once | INTRAMUSCULAR | Status: DC
  Filled 2022-07-25: qty 1

## 2022-07-25 MED ORDER — PROCHLORPERAZINE EDISYLATE 10 MG/2ML IJ SOLN
10.0000 mg | Freq: Once | INTRAMUSCULAR | Status: DC
  Filled 2022-07-25: qty 2

## 2022-07-25 MED ORDER — ACETAMINOPHEN 325 MG PO TABS
650.0000 mg | ORAL_TABLET | Freq: Once | ORAL | Status: AC
  Administered 2022-07-25: 650 mg via ORAL
  Filled 2022-07-25: qty 2

## 2022-07-25 MED ORDER — PROCHLORPERAZINE EDISYLATE 10 MG/2ML IJ SOLN
10.0000 mg | Freq: Once | INTRAMUSCULAR | Status: AC
  Administered 2022-07-25: 10 mg via INTRAMUSCULAR

## 2022-07-25 MED ORDER — KETOROLAC TROMETHAMINE 15 MG/ML IJ SOLN
15.0000 mg | Freq: Once | INTRAMUSCULAR | Status: DC
  Filled 2022-07-25: qty 1

## 2022-07-25 NOTE — ED Triage Notes (Signed)
Pt arrived via EMS, from [employer] [facility], c/o sudden onset headache, worse on the right side of head. States same area of head where brain surgery was several years ago and concerned recent seizure may have trigger something in the area of the surgery.

## 2022-07-25 NOTE — ED Notes (Signed)
Patient belongings were placed in locker 27 in [place].

## 2022-07-25 NOTE — ED Provider Triage Note (Signed)
Emergency Medicine Provider Triage Evaluation Note  Jeriko Kowalke , a 36 y.o. male  was evaluated in triage.  Pt complains of severe right sided headache, history of TBI, seizures, reports seizure yesterday, was seen at [facility] yesterday. Additionally endorses passive SI, previous attempted OD, no plan at this time.  Review of Systems  Positive: Headache, depression Negative: Nausea, vomiting, numbness, tingling, vision changes  Physical Exam  BP (!) 111/50 (BP Location: Left Arm)   Pulse 98   Temp 97.8 F (36.6 C) (Oral)   Resp 18   SpO2 96%  Gen:   Awake, no distress   Resp:  Normal effort  MSK:   Moves extremities without difficulty  Other:  Moves all 4 limbs spontaneously, CN II through XII grossly intact, can ambulate without difficulty, intact sensation throughout.   Medical Decision Making  Medically screening exam initiated at 9:11 AM.  Appropriate orders placed.  Colbe Viviano was informed that the remainder of the evaluation will be completed by another provider, this initial triage assessment does not replace that evaluation, and the importance of remaining in the ED until their evaluation is complete.  Workup initiated   Olene Floss, [place] 07/25/22 [id number]

## 2022-07-25 NOTE — ED Notes (Signed)
Pt endorsing SI to EDP
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
## 2022-07-25 NOTE — ED Provider Notes (Signed)
[facility]-EMERGENCY DEPT Provider Note   CSN: [id number] Arrival date & time: 07/25/22  8756     History  Chief Complaint  Patient presents with   Headache   Suicidal    Arthur Singleton is a 36 y.o. male past medical history significant for PTSD, bipolar, anxiety, previous traumatic brain injury, stimulant use disorder, cannabis disorder, depression, previous suicide attempt who presents with concern for recent seizure yesterday, evaluated at [facility], reports no seizure activity since then, he continues to take his Lamictal and Dilantin.  Patient reports sudden onset headache, worse on right side of the head.,  Patient reports that this is where his brain surgery was, and is worried that the seizure injured something in the brain.   Headache      Home Medications Prior to Admission medications   Medication Sig Start Date End Date Taking? Authorizing Provider  amLODipine (NORVASC) 10 MG tablet Take 1 tablet (10 mg total) by mouth daily. 07/16/22 08/15/22  Massengill, Harrold Donath, MD  FLUoxetine (PROZAC) 40 MG capsule Take 1 capsule (40 mg total) by mouth daily. 07/16/22 08/15/22  Massengill, Harrold Donath, MD  hydrOXYzine (ATARAX) 50 MG tablet Take 1 tablet (50 mg total) by mouth at bedtime. 07/15/22 08/14/22  Massengill, Harrold Donath, MD  lamoTRIgine (LAMICTAL) 25 MG tablet Take 1 tablet (25 mg total) by mouth daily. 07/16/22 08/15/22  Massengill, Harrold Donath, MD  melatonin 5 MG TABS Take 1 tablet (5 mg total) by mouth at bedtime. 07/15/22   Massengill, Harrold Donath, MD  nicotine (NICODERM CQ - DOSED IN MG/24 HOURS) 14 mg/24hr patch Place 1 patch (14 mg total) onto the skin daily. 07/16/22   Massengill, Harrold Donath, MD  prazosin (MINIPRESS) 1 MG capsule Take 1 capsule (1 mg total) by mouth at bedtime. 07/15/22 08/14/22  Massengill, Harrold Donath, MD      Allergies    Fish allergy, Iodine, Other, Shellfish allergy, and Nitrous oxide    Review of Systems   Review of Systems  Neurological:   Positive for headaches.  All other systems reviewed and are negative.   Physical Exam Updated Vital Signs BP (!) 140/81 (BP Location: Left Arm)   Pulse (!) 105   Temp 98.6 F (37 C) (Oral)   Resp 18   SpO2 97%  Physical Exam Vitals and nursing note reviewed.  Constitutional:      General: He is not in acute distress.    Appearance: Normal appearance.  HENT:     Head: Normocephalic and atraumatic.  Eyes:     General:        Right eye: No discharge.        Left eye: No discharge.  Cardiovascular:     Rate and Rhythm: Normal rate and regular rhythm.     Heart sounds: No murmur heard.    No friction rub. No gallop.  Pulmonary:     Effort: Pulmonary effort is normal.     Breath sounds: Normal breath sounds.  Abdominal:     General: Bowel sounds are normal.     Palpations: Abdomen is soft.  Skin:    General: Skin is warm and dry.     Capillary Refill: Capillary refill takes less than 2 seconds.  Neurological:     Mental Status: He is alert and oriented to person, place, and time.     Comments: Somewhat blunted affect, but otherwise Cranial nerves II through XII grossly intact.  Intact finger-nose, intact heel-to-shin.  Romberg negative, gait normal.  Alert and oriented x3.  Moves  all 4 limbs spontaneously, normal coordination.  No pronator drift.  Intact strength 5 out of 5 bilateral upper and lower extremities.    Psychiatric:        Mood and Affect: Mood normal.        Behavior: Behavior normal.     Comments: Reports some passive SI, denies any active SI with plan, HI, AVH.  Answers questions appropriately.  Does not seem to be responding to internal stimuli.     ED Results / Procedures / Treatments   Labs (all labs ordered are listed, but only abnormal results are displayed) Labs Reviewed  CBC - Abnormal; Notable for the following components:      Result Value   WBC 12.4 (*)    HCT 38.4 (*)    All other components within normal limits  BASIC METABOLIC PANEL -  Abnormal; Notable for the following components:   Glucose, Bld 137 (*)    All other components within normal limits  ACETAMINOPHEN LEVEL - Abnormal; Notable for the following components:   Acetaminophen (Tylenol), Serum <10 (*)    All other components within normal limits  SALICYLATE LEVEL - Abnormal; Notable for the following components:   Salicylate Lvl <7.0 (*)    All other components within normal limits  ETHANOL  URINALYSIS, ROUTINE W REFLEX MICROSCOPIC  LAMOTRIGINE LEVEL  PHENYTOIN LEVEL, FREE AND TOTAL    EKG None  Radiology CT Head Wo Contrast  Result Date: 07/25/2022 CLINICAL DATA:  Headache EXAM: CT HEAD WITHOUT CONTRAST TECHNIQUE: Contiguous axial images were obtained from the base of the skull through the vertex without intravenous contrast. RADIATION DOSE REDUCTION: This exam was performed according to the departmental dose-optimization program which includes automated exposure control, adjustment of the mA and/or kV according to patient size and/or use of iterative reconstruction technique. COMPARISON:  11/29/2012 FINDINGS: Brain: Area of encephalomalacia within the right frontal lobe is unchanged. No acute intracranial abnormality. Specifically, no hemorrhage, hydrocephalus, mass lesion, acute infarction, or significant intracranial injury. Vascular: No hyperdense vessel or unexpected calcification. Skull: No acute calvarial abnormality. Postoperative changes in facial and frontal bones. Sinuses/Orbits: No acute findings. Mucosal thickening in the paranasal sinuses. Other: None IMPRESSION: No acute intracranial abnormality. Electronically Signed   By: Charlett Nose M.D.   On: 07/25/2022 09:32    Procedures Procedures    Medications Ordered in ED Medications  acetaminophen (TYLENOL) tablet 650 mg (650 mg Oral Given 07/25/22 0933)  diphenhydrAMINE (BENADRYL) injection 25 mg (25 mg Intramuscular Given 07/25/22 1700)  ketorolac (TORADOL) 15 MG/ML injection 15 mg (15 mg  Intramuscular Given 07/25/22 1654)  prochlorperazine (COMPAZINE) injection 10 mg (10 mg Intramuscular Given 07/25/22 1657)    ED Course/ Medical Decision Making/ A&P Clinical Course as of 07/25/22 1747  Sat Jul 25, 2022  1346 Medically clear for TTS eval [CP]    Clinical Course User Index [CP] Olene Floss, PA-C                           Medical Decision Making Amount and/or Complexity of Data Reviewed Labs: ordered. Radiology: ordered.  Risk OTC drugs. Prescription drug management.   Patient is a 36 y.o. male  who presents to the emergency department for psychiatric complaint, as well as headache, patient was seen evaluated at [facility] yesterday, was worried that he may have some intracranial abnormality related to his history of seizures, TBI.  Past Medical History: PTSD, bipolar, seizure disorder, TBI, stimulant use disorder,  cannabis use disorder  Physical Exam: Patient is neurologically intact on my exam, with no focal neurologic deficits, he is endorsing some passive SI but no active plan to kill himself at this time.  His vital signs are overall stable, he has borderline high heart rate, pulse of 98 on arrival, with tachycardia at 105 on repeat evaluation, blood pressure slightly labile, 111/50 on arrival, repeat at 140/81, he is not having any chest pain, headache.  Labs: BMP unremarkable other than mild hyperglycemia glucose 137.  CBC notable for mild leukocytosis, white blood cells 12.4.  Ethanol, acetaminophen, salicylate levels undetectable.  Will obtain a Lamictal, and Dilantin level, but these will not result during patient's emergency department evaluation, overall patient seems compliant, low clinical suspicion for sob or supratherapeutic antiepileptic levels.  Imaging: I independently interpreted imaging including CT head without contrast which shows no acute intracranial abnormality. I agree with the radiologist interpretation.  Medications: Administered  Tylenol, Toradol, Compazine, Benadryl IM for headache with some improvement per patient.  Disposition: Patient is otherwise medically cleared at this time pending medical clearance laboratory evaluation. Will consult TTS and appreciate their recommendations.  Final Clinical Impression(s) / ED Diagnoses Final diagnoses:  Depression, unspecified depression type  Acute nonintractable headache, unspecified headache type    Rx / DC Orders ED Discharge Orders     None         Olene Floss, PA-C 07/25/22 1747    Margarita Grizzle, MD 07/26/22 [phone]

## 2022-07-25 NOTE — ED Notes (Signed)
IV attempted and missed.  Pt was seen at a hospital yesterday and had 6 attempts and misses.  Most veins that are visible are already blown from yesterday.

## 2022-07-26 ENCOUNTER — Other Ambulatory Visit: Payer: Self-pay

## 2022-07-26 ENCOUNTER — Encounter (HOSPITAL_COMMUNITY): Payer: Self-pay | Admitting: Psychiatry

## 2022-07-26 ENCOUNTER — Inpatient Hospital Stay (HOSPITAL_COMMUNITY)
Admission: AD | Admit: 2022-07-26 | Discharge: 2022-07-31 | DRG: 885 | Disposition: A | Discharge status: Home / Self Care | Payer: Medicaid Other | Source: Intra-hospital | Attending: Psychiatry | Admitting: Psychiatry

## 2022-07-26 DIAGNOSIS — Z91013 Allergy to seafood: Secondary | ICD-10-CM

## 2022-07-26 DIAGNOSIS — G40909 Epilepsy, unspecified, not intractable, without status epilepticus: Secondary | ICD-10-CM | POA: Diagnosis present

## 2022-07-26 DIAGNOSIS — F411 Generalized anxiety disorder: Secondary | ICD-10-CM | POA: Diagnosis present

## 2022-07-26 DIAGNOSIS — Z634 Disappearance and death of family member: Secondary | ICD-10-CM

## 2022-07-26 DIAGNOSIS — Z823 Family history of stroke: Secondary | ICD-10-CM

## 2022-07-26 DIAGNOSIS — T426X2A Poisoning by other antiepileptic and sedative-hypnotic drugs, intentional self-harm, initial encounter: Secondary | ICD-10-CM | POA: Diagnosis present

## 2022-07-26 DIAGNOSIS — Z87820 Personal history of traumatic brain injury: Secondary | ICD-10-CM | POA: Diagnosis not present

## 2022-07-26 DIAGNOSIS — F1511 Other stimulant abuse, in remission: Secondary | ICD-10-CM | POA: Diagnosis present

## 2022-07-26 DIAGNOSIS — Z833 Family history of diabetes mellitus: Secondary | ICD-10-CM | POA: Diagnosis not present

## 2022-07-26 DIAGNOSIS — Z765 Malingerer [conscious simulation]: Secondary | ICD-10-CM

## 2022-07-26 DIAGNOSIS — F1211 Cannabis abuse, in remission: Secondary | ICD-10-CM | POA: Diagnosis present

## 2022-07-26 DIAGNOSIS — F332 Major depressive disorder, recurrent severe without psychotic features: Principal | ICD-10-CM | POA: Diagnosis present

## 2022-07-26 DIAGNOSIS — Z8249 Family history of ischemic heart disease and other diseases of the circulatory system: Secondary | ICD-10-CM | POA: Diagnosis not present

## 2022-07-26 DIAGNOSIS — F17210 Nicotine dependence, cigarettes, uncomplicated: Secondary | ICD-10-CM | POA: Diagnosis present

## 2022-07-26 DIAGNOSIS — E785 Hyperlipidemia, unspecified: Secondary | ICD-10-CM | POA: Diagnosis present

## 2022-07-26 DIAGNOSIS — Z23 Encounter for immunization: Secondary | ICD-10-CM

## 2022-07-26 DIAGNOSIS — F4310 Post-traumatic stress disorder, unspecified: Secondary | ICD-10-CM | POA: Diagnosis present

## 2022-07-26 DIAGNOSIS — Z79899 Other long term (current) drug therapy: Secondary | ICD-10-CM | POA: Diagnosis not present

## 2022-07-26 DIAGNOSIS — Z1152 Encounter for screening for COVID-19: Secondary | ICD-10-CM

## 2022-07-26 DIAGNOSIS — F1421 Cocaine dependence, in remission: Secondary | ICD-10-CM | POA: Diagnosis present

## 2022-07-26 DIAGNOSIS — Z888 Allergy status to other drugs, medicaments and biological substances status: Secondary | ICD-10-CM

## 2022-07-26 DIAGNOSIS — Z59819 Housing instability, housed unspecified: Secondary | ICD-10-CM

## 2022-07-26 DIAGNOSIS — Z608 Other problems related to social environment: Secondary | ICD-10-CM | POA: Diagnosis present

## 2022-07-26 DIAGNOSIS — I10 Essential (primary) hypertension: Secondary | ICD-10-CM | POA: Diagnosis present

## 2022-07-26 DIAGNOSIS — Z825 Family history of asthma and other chronic lower respiratory diseases: Secondary | ICD-10-CM | POA: Diagnosis not present

## 2022-07-26 DIAGNOSIS — Z9151 Personal history of suicidal behavior: Secondary | ICD-10-CM

## 2022-07-26 DIAGNOSIS — R519 Headache, unspecified: Secondary | ICD-10-CM | POA: Diagnosis present

## 2022-07-26 DIAGNOSIS — S069XAA Unspecified intracranial injury with loss of consciousness status unknown, initial encounter: Secondary | ICD-10-CM | POA: Diagnosis present

## 2022-07-26 DIAGNOSIS — F329 Major depressive disorder, single episode, unspecified: Secondary | ICD-10-CM | POA: Diagnosis present

## 2022-07-26 LAB — RAPID URINE DRUG SCREEN, HOSP PERFORMED
Amphetamines: NOT DETECTED
Barbiturates: NOT DETECTED
Benzodiazepines: NOT DETECTED
Cocaine: NOT DETECTED
Opiates: NOT DETECTED
Tetrahydrocannabinol: POSITIVE — AB

## 2022-07-26 LAB — SARS CORONAVIRUS 2 BY RT PCR: SARS Coronavirus 2 by RT PCR: NEGATIVE

## 2022-07-26 MED ORDER — INFLUENZA VAC SPLIT QUAD 0.5 ML IM SUSY
0.5000 mL | PREFILLED_SYRINGE | INTRAMUSCULAR | Status: AC
  Administered 2022-07-27: 0.5 mL via INTRAMUSCULAR
  Filled 2022-07-26: qty 0.5

## 2022-07-26 MED ORDER — PNEUMOCOCCAL 20-VAL CONJ VACC 0.5 ML IM SUSY
0.5000 mL | PREFILLED_SYRINGE | INTRAMUSCULAR | Status: AC
  Administered 2022-07-27: 0.5 mL via INTRAMUSCULAR
  Filled 2022-07-26: qty 0.5

## 2022-07-26 MED ORDER — PNEUMOCOCCAL VAC POLYVALENT 25 MCG/0.5ML IJ INJ
0.5000 mL | INJECTION | INTRAMUSCULAR | Status: DC
  Filled 2022-07-26: qty 0.5

## 2022-07-26 MED ORDER — ACETAMINOPHEN 325 MG PO TABS: 650.0000 mg | ORAL_TABLET | Freq: Four times a day (QID) | ORAL | Status: DC | PRN

## 2022-07-26 MED ORDER — HYDROXYZINE HCL 50 MG PO TABS
50.0000 mg | ORAL_TABLET | Freq: Three times a day (TID) | ORAL | Status: DC | PRN
  Administered 2022-07-26 – 2022-07-30 (×8): 50 mg via ORAL
  Filled 2022-07-26 (×8): qty 1

## 2022-07-26 MED ORDER — ALUM & MAG HYDROXIDE-SIMETH 200-200-20 MG/5ML PO SUSP: 30.0000 mL | ORAL | Status: DC | PRN

## 2022-07-26 MED ORDER — SERTRALINE HCL 50 MG PO TABS
50.0000 mg | ORAL_TABLET | Freq: Every day | ORAL | Status: DC
  Administered 2022-07-27 – 2022-07-28 (×2): 50 mg via ORAL
  Filled 2022-07-26 (×5): qty 1

## 2022-07-26 MED ORDER — MAGNESIUM HYDROXIDE 400 MG/5ML PO SUSP: 30.0000 mL | Freq: Every day | ORAL | Status: DC | PRN

## 2022-07-26 MED ORDER — HYDROXYZINE HCL 25 MG PO TABS
ORAL_TABLET | ORAL | Status: AC
  Filled 2022-07-26: qty 1

## 2022-07-26 MED ORDER — MELATONIN 5 MG PO TABS
5.0000 mg | ORAL_TABLET | Freq: Every day | ORAL | Status: DC
  Administered 2022-07-26 – 2022-07-30 (×5): 5 mg via ORAL
  Filled 2022-07-26 (×8): qty 1

## 2022-07-26 MED ORDER — PNEUMOCOCCAL VAC POLYVALENT 25 MCG/0.5ML IJ INJ: 0.5000 mL | INJECTION | INTRAMUSCULAR | Status: DC

## 2022-07-26 MED ORDER — PRAZOSIN HCL 2 MG PO CAPS
2.0000 mg | ORAL_CAPSULE | Freq: Every day | ORAL | Status: DC
  Administered 2022-07-27 – 2022-07-28 (×2): 2 mg via ORAL
  Filled 2022-07-26 (×4): qty 1

## 2022-07-26 MED ORDER — HYDROXYZINE HCL 25 MG PO TABS
25.0000 mg | ORAL_TABLET | Freq: Three times a day (TID) | ORAL | Status: DC | PRN
  Administered 2022-07-26: 25 mg via ORAL

## 2022-07-26 MED ORDER — TRAZODONE HCL 50 MG PO TABS
50.0000 mg | ORAL_TABLET | Freq: Every evening | ORAL | Status: DC | PRN
  Administered 2022-07-27 – 2022-07-28 (×2): 50 mg via ORAL
  Filled 2022-07-26 (×4): qty 1

## 2022-07-26 NOTE — Plan of Care (Signed)
  Problem: Education: Goal: Knowledge of [facility] Education information/materials will improve Outcome: Progressing Goal: Emotional status will improve Outcome: Progressing Goal: Mental status will improve Outcome: Progressing Goal: Verbalization of understanding the information provided will improve Outcome: Progressing   Problem: Activity: Goal: Interest or engagement in activities will improve Outcome: Progressing Goal: Sleeping patterns will improve Outcome: Progressing   Problem: Coping: Goal: Ability to verbalize frustrations and anger appropriately will improve Outcome: Progressing Goal: Ability to demonstrate self-control will improve Outcome: Progressing   
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
## 2022-07-26 NOTE — BHH Suicide Risk Assessment (Signed)
[facility] Admission Suicide Risk Assessment   Nursing information obtained from:  Patient Demographic factors:  Male, Caucasian, Living alone, Low socioeconomic status Current Mental Status:  Suicidal ideation indicated by patient, Self-harm behaviors Loss Factors:  Loss of significant relationship Historical Factors:  Prior suicide attempts, Impulsivity, Anniversary of important loss Risk Reduction Factors:  NA  Total Time spent with patient: 45 minutes Principal Problem: MDD (major depressive disorder), recurrent severe, without psychosis (HCC) Diagnosis:  Principal Problem:   MDD (major depressive disorder), recurrent severe, without psychosis (HCC) Active Problems:   PTSD (post-traumatic stress disorder)   Generalized anxiety disorder   Bereavement   TBI (traumatic brain injury) (HCC)   Cocaine use disorder, severe, in early remission, in controlled environment [facility])   Malingering   Subjective Data:  Arthur Singleton is a 36 yo male w/ history of MDD, GAD, TBI, epilepsy presenting to [facility] from [facility] for claimed suicide attempt via medication overdose. Of note, patient was hospitalized at [facility] 07/09/22-07/15/2022, admitted to [facility] in [place] from 07/16/22-07/23/22, 2x ED visits in [place] visit 12/29 for "unwitnessed seizure", then 12/30 at [facility] for suicidal ideation prior to admission on 07/26/2022.   Patient reports on interval history since his discharge 07/15/22: He reports that he's been living at his friend [name]'s house since discharge 12/20. He reports medication compliance. However, he states that prozac had made him feel worse. He states he felt worsening suicidal ideation and then attempted suicide with a bottle of Norvasc and a bottle of Lamictal on 07/23/22. He states this was his deceased mother's birthday.  He states that he did not experience any side effects from the overdose.  He states he definitely wanted to die with a plan to overdose.  He  continues to endorse sporadic passive SI since overdose but denies HI/AVH.  He states "I will make sure to tell you guys if I am feeling suicidal" without any prompting.  He states he has been eating and sleeping "ok". Reports energy and concentration have been appropriate.  Patient states his primary concern is the fact that he is still dealing with his suicidal ideation.  He states that he did not feel ready to discharge from behavioral health hospital last time but he was discharged nonetheless.  He states that he has no idea how best to control his suicidal ideation repeatedly and becomes frustrated with me after I ask him how he has been less suicidal in the past stating "you keep asking me. If I knew, I wouldn't be here now would I?"  He states that he would rather be on Zoloft rather than Prozac at this time as he states that this was more effective for his depression in the past.  He was willing to reconsider Trileptal rather than being resumed on Lamictal. He states he had only been taking lamictal 25 mg.   He continues to deny psychotic symptoms and manic symptoms outside of substance use.   He reports continued anxiety.  He states that he has a prescription for clonazepam 1 mg 3 times daily scheduled to control his anxiety.  He is briefly perseverative on needing clonazepam to control his anxiety but I did discuss with him how that would be inappropriate given his history of substance use as well as multiple documented adverse effects being on clonazepam would lead to such as worsening anxiety, addiction and dementia.  He verbalized understanding and was agreeable to increasing hydroxyzine to 50 mg 3 times daily as needed to better  account for his anxiety.   Continues to endorse having PTSD symptoms including nightmares and flashbacks but states that the prazosin he has been prescribed has been helpful for him.  He was agreeable to resuming this.  Continued Clinical Symptoms:  Alcohol Use  Disorder Identification Test Final Score (AUDIT): 0 The "Alcohol Use Disorders Identification Test", Guidelines for Use in Primary Care, Second Edition.  [employer] writer [facility]). Score between 0-7:  no or low risk or alcohol related problems. Score between 8-15:  moderate risk of alcohol related problems. Score between 16-19:  high risk of alcohol related problems. Score 20 or above:  warrants further diagnostic evaluation for alcohol dependence and treatment.   CLINICAL FACTORS:   Severe Anxiety and/or Agitation Depression:   Comorbid alcohol abuse/dependence Severe Alcohol/Substance Abuse/Dependencies Personality Disorders:   Cluster B Comorbid alcohol abuse/dependence More than one psychiatric diagnosis Unstable or Poor Therapeutic Relationship Previous Psychiatric Diagnoses and Treatments   Musculoskeletal: Strength & Muscle Tone: within normal limits Gait & Station: normal Patient leans: N/A  Psychiatric Specialty Exam:  Presentation  General Appearance:  Appropriate for Environment; Casual   Eye Contact: Good   Speech: Clear and Coherent; Normal Rate   Speech Volume: Normal   Handedness: Right   Mood and Affect  Mood: Depressed   Affect: Depressed    Thought Process  Thought Processes: Coherent; Goal Directed; Linear   Descriptions of Associations:Intact   Orientation:Full (Time, Place and Person)   Thought Content:Logical   History of Schizophrenia/Schizoaffective disorder:No data recorded  Duration of Psychotic Symptoms:No data recorded  Hallucinations:Hallucinations: None   Ideas of Reference:None   Suicidal Thoughts:Suicidal Thoughts: Yes, Passive SI Active Intent and/or Plan: With Intent   Homicidal Thoughts:Homicidal Thoughts: No    Sensorium  Memory: Immediate Good   Judgment: Impaired   Insight: Fair    Chartered certified accountant: Fair   Attention  Span: Fair   Recall: [employer] of Knowledge: Fair   Language: Fair    Psychomotor Activity  Psychomotor Activity: Psychomotor Activity: Normal    Assets  Assets: Manufacturing systems engineer; Desire for Improvement; Social Support    Sleep  Sleep:Sleep: Fair     Physical Exam: Physical Exam Vitals and nursing note reviewed.  Constitutional:      Appearance: Normal appearance. He is obese.  HENT:     Head: Normocephalic and atraumatic.  Pulmonary:     Effort: Pulmonary effort is normal.  Neurological:     General: No focal deficit present.     Mental Status: He is oriented to person, place, and time.    Review of Systems  Respiratory:  Negative for shortness of breath.   Cardiovascular:  Negative for chest pain.  Gastrointestinal:  Negative for abdominal pain, constipation, diarrhea, heartburn, nausea and vomiting.  Neurological:  Negative for headaches.   Blood pressure 121/70, pulse 95, temperature 97.8 F (36.6 C), temperature source Oral, resp. rate 18, height 5\' 10"  (1.778 m), weight 120.2 kg, SpO2 99 %. Body mass index is 38.02 kg/m.   COGNITIVE FEATURES THAT CONTRIBUTE TO RISK:  Closed-mindedness    SUICIDE RISK:   Moderate:  Frequent suicidal ideation with limited intensity, and duration, some specificity in terms of plans, no associated intent, good self-control, limited dysphoria/symptomatology, some risk factors present, and identifiable protective factors, including available and accessible social support.  PLAN OF CARE: see H&P  I certify that inpatient services furnished can reasonably be expected to improve the patient's condition.   , MD  07/26/2022, 8:58 PM

## 2022-07-26 NOTE — ED Notes (Signed)
Arthur Singleton is sleep and displays no signs of distress althought breathing is sonorous.

## 2022-07-26 NOTE — Progress Notes (Addendum)
Pt admitted to [facility] voluntarily from [facility]. Patient upon admission reports that he has been depressed, that this season is very difficult for him '' I lost my dad in November, his birthday is in November, and then my mom's birthday is in December, so this holiday season is always a trigger for me and I feel worse. I took an overdose to end my life. ''  Patient reports overdosing on home medications in an attempt to end his life, and states '' I wish it would have worked, but I'm not having thoughts of acting on it right now. '' Pt reports longstanding hx of depression, TBI and seizures and states that he feels his medications need adjusting for his mood.  Pt reports that he has been sober x 2 weeks and that ''  I haven't used so that 's not it. '' Pt reports '' I left too soon from when I was here last. I didn't feel ready to leave with my depression, I wasn't well when I left. '' Pt skin searched and no contraband found, slight abraision to abdomen on right lower quadrant, however pt reports it is old and not bothersome.  Pt denies any AH OR VH. No signs of pt responding to internal stimuli.  Pt is safe, oriented to unit, given meal, hygiene items and fresh scrubs to wash clothing. Providers made aware that patient arrived.

## 2022-07-26 NOTE — ED Notes (Signed)
Pt belongings moved to [employer] [facility]

## 2022-07-26 NOTE — ED Provider Notes (Signed)
Emergency Medicine Observation Re-evaluation Note  Arthur Singleton is a 36 y.o. male, seen on rounds today.  Pt initially presented to the ED for complaints of Headache and Suicidal Currently, the patient is resting comfortably without acute distress, just got moved to back holding area.  Physical Exam  BP 139/81 (BP Location: Left Arm)   Pulse 90   Temp 99 F (37.2 C) (Oral)   Resp 18   SpO2 97%  Physical Exam General: Resting without agitation Cardiac: No murmur on my auscultation Lungs: Clear breath sounds Psych: No acute agitation  ED Course / MDM  EKG:   I have reviewed the labs performed to date as well as medications administered while in observation.  Recent changes in the last 24 hours include none reported.  Plan  Current plan is that psychiatry recommended inpatient psychiatric admission due to SI with means to harm self.  Awaiting admission.Rush Landmark, Canary Brim, MD 07/26/22 [phone]

## 2022-07-26 NOTE — Tx Team (Signed)
Initial Treatment Plan 07/26/2022 11:14 AM [name] [id number]    PATIENT STRESSORS: Other: loss of family, anniversary of important loss     PATIENT STRENGTHS: Ability for insight  Active sense of humor  Average or above average intelligence  Capable of independent living    PATIENT IDENTIFIED PROBLEMS: Depression Substance abuse Suicidal thought                     DISCHARGE CRITERIA:  Ability to meet basic life and health needs Adequate post-discharge living arrangements Improved stabilization in mood, thinking, and/or behavior Medical problems require only outpatient monitoring Motivation to continue treatment in a less acute level of care Need for constant or close observation no longer present  PRELIMINARY DISCHARGE PLAN: Outpatient therapy Return to previous living arrangement  PATIENT/FAMILY INVOLVEMENT: This treatment plan has been presented to and reviewed with the patient, Arthur Singleton, and/or family member, .  The patient and family have been given the opportunity to ask questions and make suggestions.  Malva Limes, RN 07/26/2022, 11:14 AM

## 2022-07-26 NOTE — BHH Group Notes (Signed)
Adult Psychoeducational Group  Date:  07/26/2022 Time:  1100-1200  Group Topic/Focus: Continuation of the group from Saturday. Looking at the lists that were created and talking about what needs to be done with the homework of 30 positives about themselves.                                     Talking about taking their power back and helping themselves to develop a positive self esteem.      Participation Quality:  did not attend  Alfretta Pinch A   

## 2022-07-26 NOTE — BHH Group Notes (Signed)
Adult Psychoeducational Group Note Date:  07/26/2022 Time:  0900-1000 Group Topic/Focus: PROGRESSIVE RELAXATION. A group where deep breathing is taught and tensing and relaxation muscle groups is used. Imagery is used as well.  Pts are asked to imagine 3 pillars that hold them up when they are not able to hold themselves up and to share that with the group.   Participation Level:  did not attend   : Armelia Penton A   

## 2022-07-26 NOTE — [facility] Group Notes (Signed)
Pt attended wrap up group 

## 2022-07-26 NOTE — H&P (Cosign Needed Addendum)
Psychiatric Adult Admission Assessment  Patient Identification: Arthur Singleton MRN:  [id number] Date of Evaluation:  07/26/2022 Chief Complaint:  MDD (major depressive disorder) [F32.9] Principal Diagnosis: MDD (major depressive disorder), recurrent severe, without psychosis (HCC) Diagnosis:  Principal Problem:   MDD (major depressive disorder), recurrent severe, without psychosis (HCC) Active Problems:   PTSD (post-traumatic stress disorder)   Generalized anxiety disorder   Bereavement   TBI (traumatic brain injury) (HCC)   Cocaine use disorder, severe, in early remission, in controlled environment [facility])   Malingering   Reason for Admission: Arthur Singleton is a 36 yo male w/ history of MDD, GAD, TBI, epilepsy presenting to [facility] from [facility] for claimed suicide attempt via medication overdose. Of note, patient was hospitalized at [facility] 07/09/22-07/15/2022, admitted to [facility] in [place] from 07/16/22-07/23/22, 2x ED visits in [place] visit 12/29 for "unwitnessed seizure", then 12/30 at [facility] for suicidal ideation prior to admission on 07/26/2022.  History of Present Illness:  Patient reports on interval history since his discharge 07/15/22: He reports that he's been living at his friend [name]'s house since discharge 12/20. He reports medication compliance. However, he states that prozac had made him feel worse. He states he felt worsening suicidal ideation and then attempted suicide with a bottle of Norvasc and a bottle of Lamictal on 07/23/22. He states this was his deceased mother's birthday.  He states that he did not experience any side effects from the overdose.  He states he definitely wanted to die with a plan to overdose.  He continues to endorse sporadic passive SI since overdose but denies HI/AVH.  He states "I will make sure to tell you guys if I am feeling suicidal" without any prompting.  He states he has been eating and sleeping "ok". Reports energy and  concentration have been appropriate.  Patient states his primary concern is the fact that he is still dealing with his suicidal ideation.  He states that he did not feel ready to discharge from behavioral health hospital last time but he was discharged nonetheless.  He states that he has no idea how best to control his suicidal ideation repeatedly and becomes frustrated with me after I ask him how he has been less suicidal in the past stating "you keep asking me. If I knew, I wouldn't be here now would I?"  He states that he would rather be on Zoloft rather than Prozac at this time as he states that this was more effective for his depression in the past.  He was willing to reconsider Trileptal rather than being resumed on Lamictal. He states he had only been taking lamictal 25 mg.  He continues to deny psychotic symptoms and manic symptoms outside of substance use.  He reports continued anxiety.  He states that he has a prescription for clonazepam 1 mg 3 times daily scheduled to control his anxiety.  He is briefly perseverative on needing clonazepam to control his anxiety but I did discuss with him how that would be inappropriate given his history of substance use as well as multiple documented adverse effects being on clonazepam would lead to such as worsening anxiety, addiction and dementia.  He verbalized understanding and was agreeable to increasing hydroxyzine to 50 mg 3 times daily as needed to better account for his anxiety.  Continues to endorse having PTSD symptoms including nightmares and flashbacks but states that the prazosin he has been prescribed has been helpful for him.  He was agreeable to resuming this.  He  reports that he has been resumed on his antiseizure medications including Dilantin and Keppra.  I asked if he had a seizure since he was hospitalized but he denied it.  Chart Review: Patient was hospitalized from 12/21-12/28 at [facility] health inpatient unit.  There was strong concern  for malingering at that time as well given he had just been discharged the day before admission on the 28th from [facility].  He was discharged on amlodipine 10 mg, Prozac 60 mg daily, nicotine patch, Cozaar 12.5 mg, prazosin 2 mg, Lamictal 50 mg daily. When discharged, he promptly returned 12/28 to [facility] ED in [facility] claiming headache for 3-4 days. He was in the ED until 12/29 at 3 PM and then discharged. He then presented to [facility] ED in [place] on 12/29 5 PM complaining of unwitnessed seizure. He was started on Lamictal 200 mg bid for 21 days and dilantin 100 mg tid for 21 days.   He then went back to [facility] ED in [facility] for witnessed seizure that was documented to have lasted 3 (or 30?) minutes. He presents 12/30 to [facility] for headache and passive SI.   Collateral: Patient adamantly refusing for me to speak with anyone for collateral.  Past Psychiatric Hx (from prior) Previous diagnoses of MDD and GAD.  Previously reported last [facility] hospitalization was 10 years ago before [facility] admission. Upon chart review, he was at [facility] in [place], [place] from 03/21/22-03/23/22 for SI, [facility] 02/11/22-02/16/22 in [place], [place] for [place], 10/26/21-10/28/21 in [facility] for SI. 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 Psychiatric medication history: Most recently taking Prozac and lamotrigine, stopped 3 years ago.  Reports having a rash on lamotrigine in 2009, but this was restarted by a neurologist, and he took the lamotrigine for 10 years thereafter without any side effects or rash. Patient also reports taking Zoloft, Lexapro, Celexa, Paxil, Effexor, lithium ("feel funny"), Depakote ("got fatty liver disease"), Trileptal, Abilify, Risperdal, Seroquel in the past. 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 States that he has never been premedicated before. 08/13/16 jzp  Other Reaction(s): Unknown  Reaction Type: Allergy   Other Shortness Of Breath and Other (See Comments)    Succinylcholine - Pulmonary edema Gadolinium - Anaphylaxis   Shellfish Allergy Anaphylaxis and Swelling   Shellfish-Derived Products Anaphylaxis    "Seafood."  Reaction Type: Allergy; Reaction(s): Anaphylactic reaction to  food,unk "Seafood."   Succinylcholine Anaphylaxis    Other Reaction(s): Other  Reaction Type: Allergy; Reaction(s): Anaphylactic shock due to serum,unk   Barium Sulfate     Other Reaction(s): Other  Reaction Type: Allergy; Reaction(s): Anaphase constriction,unk   Nitrous Oxide Other (See Comments)    Pulmonary edema    PCP: Patient, No Pcp Per  Family History: Denies any known family history of psychiatric illness or suicide attempt     Risk to self: moderate Risk to others: minimal  Lab Results:  Results for orders placed or performed during the hospital encounter of 07/26/22 (from  the past 48 hour(s))  Rapid urine drug screen (hospital performed)     Status: Abnormal   Collection Time: 07/26/22 12:22 PM  Result Value Ref Range   Opiates NONE DETECTED NONE DETECTED   Cocaine NONE DETECTED NONE DETECTED   Benzodiazepines NONE DETECTED NONE DETECTED   Amphetamines NONE DETECTED NONE DETECTED   Tetrahydrocannabinol POSITIVE (A) NONE DETECTED   Barbiturates NONE DETECTED NONE DETECTED    Comment: (NOTE) DRUG SCREEN FOR MEDICAL PURPOSES ONLY.  IF CONFIRMATION IS NEEDED FOR ANY PURPOSE, NOTIFY LAB WITHIN 5 DAYS.  LOWEST DETECTABLE LIMITS FOR URINE DRUG SCREEN Drug Class                     Cutoff (ng/mL) Amphetamine and metabolites    1000 Barbiturate and metabolites    200 Benzodiazepine                 200 Opiates and metabolites        300 Cocaine and metabolites        300 THC                            50 Performed at [facility], [address], [place], [place] [address]     Blood Alcohol level:  Lab Results  Component Value Date   [facility] <10 07/25/2022   ETH <11 11/29/2012    Metabolic Disorder Labs:  Lab Results  Component Value Date   HGBA1C 5.5 07/09/2022   MPG 111 07/09/2022   No results found for: "PROLACTIN" Lab Results  Component Value Date   CHOL 168 07/09/2022   TRIG 138 07/09/2022   HDL 31 (L) 07/09/2022   CHOLHDL 5.4 07/09/2022   VLDL 28 07/09/2022   LDLCALC 109 (H) 07/09/2022    Musculoskeletal: Strength & Muscle Tone: wnl Gait & Station: n/a  Psychiatric Specialty Exam: Presentation  General Appearance:  Appropriate for Environment; Casual  Eye Contact: Good  Speech: Clear and Coherent; Normal Rate  Speech Volume: Normal   Mood and Affect  Mood: Depressed  Affect: Depressed   Thought Process  Thought Processes: Coherent; Goal Directed; Linear  Descriptions of Associations:Intact  Orientation:Full (Time, Place and Person)  Thought  Content:Logical  Hallucinations:Hallucinations: None  Ideas of Reference:None  Suicidal Thoughts:Suicidal Thoughts: Yes, Passive SI Active Intent and/or Plan: With Intent  Homicidal Thoughts:Homicidal Thoughts: No   Sensorium  Memory: Immediate Good  Judgment: Impaired  Insight: Fair   Chartered certified accountant: Fair  Attention Span: Fair  Recall: [employer] of Knowledge: Fair  Language: Fair   Psychomotor Activity  Psychomotor Activity: Psychomotor Activity: Normal   Assets  Assets: Manufacturing systems engineer; Desire for Improvement; Social Support   Sleep  Sleep:Sleep: Fair   Physical Exam: Physical Exam Vitals and nursing note reviewed.  Constitutional:  Appearance: Normal appearance. He is normal weight.  HENT:     Head: Normocephalic and atraumatic.  Pulmonary:     Effort: Pulmonary effort is normal.  Neurological:     General: No focal deficit present.     Mental Status: He is oriented to person, place, and time.    Review of Systems  Respiratory:  Negative for shortness of breath.   Cardiovascular:  Negative for chest pain.  Gastrointestinal:  Negative for abdominal pain, constipation, diarrhea, heartburn, nausea and vomiting.  Neurological:  Negative for headaches.   Blood pressure 121/70, pulse 95, temperature 97.8 F (36.6 C), temperature source Oral, resp. rate 18, height 5\' 10"  (1.778 m), weight 120.2 kg, SpO2 99 %. Body mass index is 38.02 kg/m.  Assessment Skeet SimmerChristopher Lascano is a 36 yo male w/ history of MDD, GAD, TBI, epilepsy [facility] for claimed suicide attempt via medication overdose.   Patient's story is extremely inconsistent with chart review and I am strongly concerned about patient malingering. He did not tell me about his psychiatric hospitalization for the week after [facility]. Patient's ED visits have also complicated the matter of his lamictal dosing as he was suddenly  prescribed lamictal 200 mg bid from what seems to have initially been lamictal 50 mg during his psychiatric hospitalization at [facility] 2 days prior. I ordered a lamictal level to see if he has been remotely compliant with his lamotrigine prior to claimed overdose. He also states he did not have any side effects from the overdose of lamictal and norvasc which is remarkable given amlodipine causes both vasodilation and impaired cardiac metabolism and contractility which would lead to refractory hypotension usually requiring ICU admission given need for vasopressors and lamotrigine toxicity is known to cause severe lethargy, nausea/vomiting, and encephalopathy. He appears to travel to different states over the past several [place] from [place], [place], [place], and [place]. I do not think he is a good candidate for lamotrigine at this time given his inconsistent medication compliance. I have started him back on zoloft for depression and prazosin for PTSD. He requested clonazepam to control anxiety which I declined restarting. He has not filled any of his medications according to dispense report and has not been on any controlled substances according to PDMP.  That being said, we will attempt to treat what may be an underlying depression that could have been exacerbated by his mother's death several years ago if this is indeed true. He does have multiple risk factors for suicide including limited social support, substance dependence, anniversary of loved one's death, housing instability. He has been previously noted to have cluster B traits which may impair his ability to navigate his mental health problems. I am concerned about his risk for relapse on illicit substance use so may recommend a sober living community, SLA, trosa, or residential rehab tomorrow with the assistance of LCSW.  PLAN Safety and Monitoring: voluntarily admission to inpatient psychiatric unit for safety, stabilization and  treatment Daily contact with patient to assess and evaluate symptoms and progress in treatment Appropriate medication management to further stabilize patient Patient's case will be regularly discussed in multi-disciplinary team meeting Observation Level : q15 minute checks Vital signs: q12 hours Precautions: suicide, elopement, and assault  2. Psychiatric Problems MDD severe recurrent without psychotic features GAD PTSD Stimulant use disorder, in early remission Cannabis use disorder, in early remission Malingering TBI History of epilepsy -Start zoloft 50 mg daily for depression -Restart hydroxyzine  50 mg tid prn for anxiety -Restart prazosin 2 mg qhs for ptsd nightmares  -monitor BP -Consider mood stabilizer if necessary --The risks/benefits/side-effects/alternatives to this medication were discussed in detail with the patient and time was given for questions. The patient consents to medication trial.  -- Encouraged patient to participate in unit milieu and in scheduled group therapies   3. Medical Management Leucocytosis Nonspecific. Denies fever, cough, n/v, chills, malaise, diaphoresis.  PRN The following PRN medications were added to ensure patient can focus on treatment. These were discussed with patient and patient aware of ability to ask for the following medications:  -Tylenol 650 mg q6hr PRN for mild pain -Mylanta 30 ml suspension for indigestion -Milk of Magnesia 30 ml for constipation -Trazodone 50 mg qhs for insomnia -Hydroxyzine 25 mg tid PRN for anxiety  4. Discharge Planning Patient will require the following based on my assessment:  Greatly appreciate CSW and Case management assistance with facilitating these needs and any further recommendations regarding patient's needs upon discharge. Estimated LOS: 3-5 days Discharge Concerns: Need to establish a safety plan; Medication compliance and effectiveness Discharge Goals: Return home with outpatient referrals  for mental health follow-up including medication management/psychotherapy   Long Term Goal(s): Minimizing disruption current psychiatric diagnosis is causing so that patient can be safely discharged Short Term Goals: Compliance with proposed treatment plan and adjusting to psychiatric unit and peers.  I certify that inpatient services furnished can reasonably be expected to improve the patient's condition.     Park Pope, MD PGY2 Psychiatry Resident 07/26/2022 8:59 PM

## 2022-07-27 ENCOUNTER — Encounter (HOSPITAL_COMMUNITY): Payer: Self-pay

## 2022-07-27 DIAGNOSIS — F332 Major depressive disorder, recurrent severe without psychotic features: Secondary | ICD-10-CM | POA: Diagnosis not present

## 2022-07-27 MED ORDER — TOPIRAMATE 25 MG PO TABS
25.0000 mg | ORAL_TABLET | Freq: Two times a day (BID) | ORAL | Status: DC
  Administered 2022-07-27 – 2022-07-31 (×8): 25 mg via ORAL
  Filled 2022-07-27 (×14): qty 1

## 2022-07-27 NOTE — Progress Notes (Signed)
D) Pt received calm, visible, participating in milieu, and in no acute distress. Pt A & O x4. Pt denies SI, HI, A/ V H, depression, anxiety and pain at this time. A) Pt encouraged to drink fluids. Pt encouraged to come to staff with needs. Pt encouraged to attend and participate in groups. Pt encouraged to set reachable goals.  R) Pt remained safe on unit, in no acute distress, will continue to assess.     07/26/22 2000  Psych Admission Type (Psych Patients Only)  Admission Status Involuntary  Psychosocial Assessment  Patient Complaints Anxiety  Eye Contact Fair  Facial Expression Flat  Affect Appropriate to circumstance  Speech Logical/coherent  Interaction Assertive  Motor Activity Slow  Appearance/Hygiene Unremarkable  Behavior Characteristics Appropriate to situation  Mood Anxious  Thought Process  Coherency WDL  Content WDL  Delusions None reported or observed  Perception WDL  Hallucination None reported or observed  Judgment WDL  Confusion None  Danger to Self  Current suicidal ideation? Denies  Agreement Not to Harm Self Yes  Description of Agreement verbal  Danger to Others  Danger to Others None reported or observed

## 2022-07-27 NOTE — [facility] Group Notes (Signed)
Pt did not attend goals group. 

## 2022-07-27 NOTE — Plan of Care (Signed)
  Problem: Education: Goal: Knowledge of [facility] Education information/materials will improve Outcome: Progressing Goal: Emotional status will improve Outcome: Progressing Goal: Mental status will improve Outcome: Progressing Goal: Verbalization of understanding the information provided will improve Outcome: Progressing   Problem: Activity: Goal: Interest or engagement in activities will improve Outcome: Progressing Goal: Sleeping patterns will improve Outcome: Progressing   Problem: Coping: Goal: Ability to verbalize frustrations and anger appropriately will improve Outcome: Progressing Goal: Ability to demonstrate self-control will improve Outcome: Progressing   

## 2022-07-27 NOTE — Progress Notes (Signed)
Pt presents with depressed mood, affect congruent. Arthur Singleton reports he had poor sleep last night without receiving his minipress, and endorses that he had nightmares making anxiety worse this am. Pt given vistaril this am with good effect, pt observed in dayroom later this shift.  He has been pleasant and cooperative, calm and appropriate. Pt completed self inventory and rates his depression at 8/10 on scale, 10 being worst 0 being none. He rates his hopelessness at 8/10 on scale, 10 being worst 0 being none. He rates his anxiety at 6/10 on scale, 10 being worst 0 being none. He states his goal is to speak with doctor about medications. Pt able to make needs known. Eating and drinking well, compliant with medications. Pt is safe.

## 2022-07-27 NOTE — BH IP Treatment Plan (Signed)
Interdisciplinary Treatment and Diagnostic Plan Update  07/27/2022 Time of Session: 10:00am Arthur Singleton MRN: [id number]  Principal Diagnosis: MDD (major depressive disorder), recurrent severe, without psychosis (HCC)  Secondary Diagnoses: Principal Problem:   MDD (major depressive disorder), recurrent severe, without psychosis (HCC) Active Problems:   PTSD (post-traumatic stress disorder)   Generalized anxiety disorder   Bereavement   TBI (traumatic brain injury) (HCC)   Cocaine use disorder, severe, in early remission, in controlled environment (HCC)   Malingering   Current Medications:  Current Facility-Administered Medications  Medication Dose Route Frequency Provider Last Rate Last Admin   acetaminophen (TYLENOL) tablet 650 mg  650 mg Oral Q6H PRN Motley-Mangrum, Jadeka A, PMHNP       alum & mag hydroxide-simeth (MAALOX/MYLANTA) 200-200-20 MG/5ML suspension 30 mL  30 mL Oral Q4H PRN Motley-Mangrum, Jadeka A, PMHNP       hydrOXYzine (ATARAX) tablet 50 mg  50 mg Oral TID PRN Park Pope, MD   50 mg at 07/27/22 0717   magnesium hydroxide (MILK OF MAGNESIA) suspension 30 mL  30 mL Oral Daily PRN Motley-Mangrum, Jadeka A, PMHNP       melatonin tablet 5 mg  5 mg Oral QHS Park Pope, MD   5 mg at 07/26/22 2107   prazosin (MINIPRESS) capsule 2 mg  2 mg Oral QHS Park Pope, MD       sertraline (ZOLOFT) tablet 50 mg  50 mg Oral Daily Park Pope, MD   50 mg at 07/27/22 0719   topiramate (TOPAMAX) tablet 25 mg  25 mg Oral BID Park Pope, MD       traZODone (DESYREL) tablet 50 mg  50 mg Oral QHS PRN Motley-Mangrum, Geralynn Ochs A, PMHNP       PTA Medications: Medications Prior to Admission  Medication Sig Dispense Refill Last Dose   amLODipine (NORVASC) 10 MG tablet Take 1 tablet (10 mg total) by mouth daily. 30 tablet 0 Past Week   clonazePAM (KLONOPIN) 0.5 MG tablet Take 1 mg by mouth in the morning, at noon, and at bedtime.   Past Week   FLUoxetine (PROZAC) 20 MG capsule Take 60 mg by  mouth daily.   Past Week   hydrOXYzine (ATARAX) 50 MG tablet Take 1 tablet (50 mg total) by mouth at bedtime. (Patient taking differently: Take 50 mg by mouth at bedtime as needed for anxiety.) 30 tablet 0 Past Month   Lacosamide 100 MG TABS Take 100 mg by mouth 2 (two) times daily.   Past Week   lamoTRIgine (LAMICTAL) 200 MG tablet Take 200 mg by mouth 2 (two) times daily.   Past Week   levETIRAcetam (KEPPRA) 1000 MG tablet Take 1,000 mg by mouth 2 (two) times daily.   Past Week   losartan (COZAAR) 25 MG tablet Take 0.5 tablets by mouth daily.   Past Week   lovastatin (MEVACOR) 20 MG tablet Take 20 mg by mouth at bedtime.   Past Week   melatonin 5 MG TABS Take 1 tablet (5 mg total) by mouth at bedtime.  0 Past Week   phenytoin (DILANTIN) 100 MG ER capsule Take 100 mg by mouth 3 (three) times daily.   Past Week   prazosin (MINIPRESS) 2 MG capsule Take 2 mg by mouth at bedtime.   Past Week   sertraline (ZOLOFT) 25 MG tablet Take 25 mg by mouth daily.   Past Week    Patient Stressors: Other: loss of family, anniversary of important loss    Patient Strengths: Ability  for insight  Active sense of humor  Average or above average intelligence  Capable of independent living   Treatment Modalities: Medication Management, Group therapy, Case management,  1 to 1 session with clinician, Psychoeducation, Recreational therapy.   Physician Treatment Plan for Primary Diagnosis: MDD (major depressive disorder), recurrent severe, without psychosis ([place]) Long Term Goal(s):     Short Term Goals:    Medication Management: Evaluate patient's response, side effects, and tolerance of medication regimen.  Therapeutic Interventions: 1 to 1 sessions, Unit Group sessions and Medication administration.  Evaluation of Outcomes: Progressing  Physician Treatment Plan for Secondary Diagnosis: Principal Problem:   MDD (major depressive disorder), recurrent severe, without psychosis ([place]) Active Problems:   PTSD  (post-traumatic stress disorder)   Generalized anxiety disorder   Bereavement   TBI (traumatic brain injury) ([place])   Cocaine use disorder, severe, in early remission, in controlled environment ([place])   Malingering  Long Term Goal(s):     Short Term Goals:       Medication Management: Evaluate patient's response, side effects, and tolerance of medication regimen.  Therapeutic Interventions: 1 to 1 sessions, Unit Group sessions and Medication administration.  Evaluation of Outcomes: Progressing   RN Treatment Plan for Primary Diagnosis: MDD (major depressive disorder), recurrent severe, without psychosis ([place]) Long Term Goal(s): Knowledge of disease and therapeutic regimen to maintain health will improve  Short Term Goals: Ability to remain free from injury will improve, Ability to verbalize frustration and anger appropriately will improve, Ability to demonstrate self-control, Ability to participate in decision making will improve, Ability to verbalize feelings will improve, Ability to disclose and discuss suicidal ideas, Ability to identify and develop effective coping behaviors will improve, and Compliance with prescribed medications will improve  Medication Management: RN will administer medications as ordered by provider, will assess and evaluate patient's response and provide education to patient for prescribed medication. RN will report any adverse and/or side effects to prescribing provider.  Therapeutic Interventions: 1 on 1 counseling sessions, Psychoeducation, Medication administration, Evaluate responses to treatment, Monitor vital signs and CBGs as ordered, Perform/monitor CIWA, COWS, AIMS and Fall Risk screenings as ordered, Perform wound care treatments as ordered.  Evaluation of Outcomes: Progressing   LCSW Treatment Plan for Primary Diagnosis: MDD (major depressive disorder), recurrent severe, without psychosis ([place]) Long Term Goal(s): Safe transition to appropriate next  level of care at discharge, Engage patient in therapeutic group addressing interpersonal concerns.  Short Term Goals: Engage patient in aftercare planning with referrals and resources, Increase social support, Increase ability to appropriately verbalize feelings, Increase emotional regulation, Facilitate acceptance of mental health diagnosis and concerns, Facilitate patient progression through stages of change regarding substance use diagnoses and concerns, Identify triggers associated with mental health/substance abuse issues, and Increase skills for wellness and recovery  Therapeutic Interventions: Assess for all discharge needs, 1 to 1 time with Social worker, Explore available resources and support systems, Assess for adequacy in community support network, Educate family and significant other(s) on suicide prevention, Complete Psychosocial Assessment, Interpersonal group therapy.  Evaluation of Outcomes: Progressing   Progress in Treatment: Attending groups: No. Participating in groups: No. Taking medication as prescribed: Yes. Toleration medication: Yes. Family/Significant other contact made: No, will contact:  patient declined consents Patient understands diagnosis: Yes. Discussing patient identified problems/goals with staff: Yes. Medical problems stabilized or resolved: Yes. Denies suicidal/homicidal ideation: Yes. Issues/concerns per patient self-inventory: No.   New problem(s) identified: No, Describe:  none reported   New Short Term/Long Term Goal(s):  medication stabilization, elimination of SI thoughts, development of comprehensive mental wellness plan.    Patient Goals:  Pt states, "I would like for the thoughts of suicide to calm down"  Discharge Plan or Barriers: Patient recently admitted. CSW will continue to follow and assess for appropriate referrals and possible discharge planning.    Reason for Continuation of Hospitalization: Anxiety Depression Medication  stabilization  Estimated Length of Stay: 5-7 days  Last 3 [place] Suicide Severity Risk Score: Flowsheet Row Admission (Current) from 07/26/2022 in [facility] ED from 07/25/2022 in [place] DEPT Admission (Discharged) from 07/09/2022 in [facility]  C-SSRS RISK CATEGORY High Risk High Risk Low Risk       Last PHQ 2/9 Scores:     No data to display          Scribe for Treatment Team: Zachery Conch, LCSW 07/27/2022 1:12 PM

## 2022-07-27 NOTE — BHH Group Notes (Signed)
Pt attended AA group 

## 2022-07-27 NOTE — Progress Notes (Signed)
[facility] MD Progress Note  07/27/2022 1:18 PM Arthur Singleton  MRN:  [id number] Principal Problem: MDD (major depressive disorder), recurrent severe, without psychosis (HCC) Diagnosis: Principal Problem:   MDD (major depressive disorder), recurrent severe, without psychosis (HCC) Active Problems:   PTSD (post-traumatic stress disorder)   Generalized anxiety disorder   Bereavement   TBI (traumatic brain injury) (HCC)   Cocaine use disorder, severe, in early remission, in controlled environment [facility])   Malingering   Reason for Admission: Arthur Singleton is a 37 yo male w/ history of MDD, GAD, TBI, epilepsy presenting to [facility] from [facility] for claimed suicide attempt via medication overdose. This is hospitalization day 1.   Subjective:  Patient seen and assessed at bedside.  Patient reports sporadic passive SI but is able to contract for safety while in the milieu.  He denies HI/AVH.  Reports poor sleep yesterday because he was able to receive prazosin overnight.  He states that he received melatonin last night but this did not help with sleep.  He reports having nightmares last night.  He reports because of this he has had increased anxiety and similar depression  He reports appetite has been appropriate.  He denies any GI symptoms.  He denies any somatic complaints from initiation of Zoloft.  We discussed starting Topamax to help with both anxiety and as a's antiseizure medication.  Patient was agreeable to this.  Patient had no other questions at this time.   Objective:  Chart Review Past 24 hours of patient's chart was reviewed.  Patient is compliant with scheduled meds. Required Agitation PRNs: none Per RN notes, no documented behavioral issues and is not attending group. Patient slept, unknown hours  Total Time spent with patient: 45 minutes  Past Psychiatric History:  Previous diagnoses of MDD and GAD.  Previously reported last [facility] hospitalization was 10 years ago before [facility]  admission. Upon chart review, he was at [facility] in [place] from 03/21/22-03/23/22 for SI, [place] [facility] 02/11/22-02/16/22 in [place] for [place], 10/26/21-10/28/21 in [facility] for SI. 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 Psychiatric medication history: Most recently taking Prozac and lamotrigine, stopped 3 years ago.  Reports having a rash on lamotrigine in 2009, but this was restarted by a neurologist, and he took the lamotrigine for 10 years thereafter without any side effects or rash. Patient also reports taking Zoloft, Lexapro, Celexa, Paxil, Effexor, lithium ("feel funny"), Depakote ("got fatty liver disease"), Trileptal, Abilify, Risperdal, Seroquel in the past. 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   Pseudoseizures    questioned after normal EEG 08/2011, staging "seizure" to obtain narcotic pain meds   Seizures (HCC)    Traumatic brain injury [facility])     Past Surgical History:  Procedure Laterality Date   BRAIN SURGERY     partial frontal lobectomy   LOBECTOMY     right arm growth plate     Family History:  Family History  Problem Relation Age of Onset   COPD Mother    Coronary  artery disease Mother    Stroke Mother    Diabetes Mother    Diabetes Father     Social History:  Social History   Substance and Sexual Activity  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, sober several years     Social History   Substance and Sexual Activity  Drug Use Not Currently   Types: Marijuana, Methamphetamines   Comment: not current, sober 2 weeks     Social History   Socioeconomic History   Marital status: Single    Spouse name: Not on file   Number of children: Not on file   Years of education: Not on file   Highest education level: Not on file  Occupational History   Not on file  Tobacco Use   Smoking status: Every Day    Packs/day: 1.00    Types: Cigarettes   Smokeless tobacco: Not on file  Substance and Sexual Activity   Alcohol use: Not Currently    Comment: hx, sober several years   Drug use: Not Currently    Types: Marijuana, Methamphetamines    Comment: not current, sober 2 weeks   Sexual activity: Yes    Birth control/protection: Condom  Other Topics Concern   Not on file  Social History Narrative   ** Merged History Encounter **       Social Determinants of Health   Financial Resource Strain: Not on file  Food Insecurity: No Food Insecurity (07/26/2022)   Hunger Vital Sign    Worried About Running Out of Food in the Last Year: Never true    Ran Out of Food in the Last Year: Never true  Transportation Needs: No Transportation Needs (07/26/2022)   PRAPARE - Administrator, Civil Service (Medical): No    Lack of Transportation (Non-Medical): No  Physical Activity: Not on file  Stress: Not on file  Social Connections: Not on file   Additional Social History:                         Current Medications: Current Facility-Administered Medications  Medication Dose Route Frequency Provider Last Rate Last Admin   acetaminophen (TYLENOL) tablet 650 mg  650 mg Oral Q6H PRN Motley-Mangrum, Jadeka A, PMHNP       alum & mag hydroxide-simeth (MAALOX/MYLANTA) 200-200-20 MG/5ML suspension 30 mL  30 mL Oral Q4H PRN Motley-Mangrum, Jadeka A, PMHNP       hydrOXYzine (ATARAX) tablet 50 mg  50 mg Oral TID PRN Park Pope, MD   50 mg at 07/27/22 0717   magnesium hydroxide (MILK OF MAGNESIA) suspension 30 mL  30 mL Oral Daily PRN Motley-Mangrum, Jadeka A, PMHNP       melatonin tablet 5 mg  5 mg Oral  QHS Park Pope, MD   5 mg at 07/26/22 2107   prazosin (MINIPRESS) capsule 2 mg  2 mg Oral QHS Park Pope, MD       sertraline (ZOLOFT) tablet 50 mg  50 mg Oral Daily Park Pope, MD   50 mg at 07/27/22 0719   topiramate (TOPAMAX) tablet 25 mg  25 mg Oral BID Park Pope, MD       traZODone (DESYREL) tablet 50 mg  50 mg Oral QHS PRN Motley-Mangrum, Ezra Sites, PMHNP        Lab Results:  No results found for this or any previous visit (from the past 24 hour(s)).  Blood Alcohol level:  Lab Results  Component  Value Date   [facility] <10 07/25/2022   ETH <11 53/29/9242    Metabolic Disorder Labs: Lab Results  Component Value Date   HGBA1C 5.5 07/09/2022   MPG 111 07/09/2022   No results found for: "PROLACTIN" Lab Results  Component Value Date   CHOL 168 07/09/2022   TRIG 138 07/09/2022   HDL 31 (L) 07/09/2022   CHOLHDL 5.4 07/09/2022   VLDL 28 07/09/2022   LDLCALC 109 (H) 07/09/2022    Physical Findings:  Musculoskeletal: Strength & Muscle Tone: within normal limits Gait & Station: normal  Psychiatric Specialty Exam:  Presentation  General Appearance:  Appropriate for Environment; Casual   Eye Contact: Good   Speech: Clear and Coherent; Normal Rate   Speech Volume: Normal   Handedness: Right    Mood and Affect  Mood: Depressed   Affect: Depressed    Thought Process  Thought Processes: Coherent; Goal Directed; Linear   Descriptions of Associations:Intact   Orientation:Full (Time, Place and Person)   Thought Content:Logical   History of Schizophrenia/Schizoaffective disorder:No data recorded  Duration of Psychotic Symptoms:No data recorded  Hallucinations:Hallucinations: None  Ideas of Reference:None   Suicidal Thoughts:Suicidal Thoughts: Yes, Passive  Homicidal Thoughts:Homicidal Thoughts: No   Sensorium  Memory: Immediate Good   Judgment: Impaired   Insight: Fair    Manufacturing systems engineer: Fair   Attention Span: Fair   Recall: [employer] of Knowledge: Fair   Language: Fair    Psychomotor Activity  Psychomotor Activity: Psychomotor Activity: Normal   Assets  Assets: Communication Skills; Desire for Improvement; Social Support    Sleep  Sleep: Sleep: Fair    Physical Exam: Review of Systems  Respiratory:  Negative for shortness of breath.   Cardiovascular:  Negative for chest pain.  Gastrointestinal:  Negative for abdominal pain, constipation, diarrhea, heartburn, nausea and vomiting.  Neurological:  Negative for headaches.   Blood pressure 124/81, pulse 88, temperature 98.4 F (36.9 C), temperature source Oral, resp. rate 18, height 5\' 10"  (1.778 m), weight 120.2 kg, SpO2 99 %. Body mass index is 38.02 kg/m.   ASSESSMENT AND PLAN Arthur Singleton is a 37 yo male w/ history of MDD, GAD, TBI, epilepsy presenting to [facility] from [facility] for claimed suicide attempt via medication overdose.This is hospitalization day 1.   PLAN Safety and Monitoring: voluntarily admission to inpatient psychiatric unit for safety, stabilization and treatment Daily contact with patient to assess and evaluate symptoms and progress in treatment Appropriate medication management to further stabilize patient Patient's case will be regularly discussed in multi-disciplinary team meeting Observation Level : q15 minute checks Vital signs: q12 hours Precautions: suicide, elopement, and assault   2. Psychiatric Problems MDD severe recurrent without psychotic features GAD PTSD Stimulant use disorder, in early remission Cannabis use disorder, in early remission Malingering TBI History of epilepsy -Continue zoloft 50 mg daily for depression -Continue hydroxyzine 50 mg tid prn for anxiety -Continue prazosin 2 mg qhs for ptsd nightmares             -monitor BP -Start topamax 25 mg bid for seizure prophylaxis --The  risks/benefits/side-effects/alternatives to this medication were discussed in detail with the patient and time was given for questions. The patient consents to medication trial.  -- Encouraged patient to participate in unit milieu and in scheduled group therapies    3. Medical Management Leucocytosis Nonspecific. Denies fever, cough, n/v, chills, malaise, diaphoresis.   PRN The following PRN medications were added to ensure patient can focus  on treatment. These were discussed with patient and patient aware of ability to ask for the following medications:  -Tylenol 650 mg q6hr PRN for mild pain -Mylanta 30 ml suspension for indigestion -Milk of Magnesia 30 ml for constipation -Trazodone 50 mg qhs for insomnia -Hydroxyzine 25 mg tid PRN for anxiety   4. Discharge Planning Patient will require the following based on my assessment:  Greatly appreciate CSW and Case management assistance with facilitating these needs and any further recommendations regarding patient's needs upon discharge. Estimated LOS: 3-5 days Discharge Concerns: Need to establish a safety plan; Medication compliance and effectiveness Discharge Goals: Return home with outpatient referrals for mental health follow-up including medication management/psychotherapy     Long Term Goal(s): Minimizing disruption current psychiatric diagnosis is causing so that patient can be safely discharged Short Term Goals: Compliance with proposed treatment plan and adjusting to psychiatric unit and peers.     France Ravens, MD 07/27/2022, 1:18 PM

## 2022-07-27 NOTE — Progress Notes (Signed)
   07/27/22 2200  Psych Admission Type (Psych Patients Only)  Admission Status Voluntary  Psychosocial Assessment  Patient Complaints Anxiety  Eye Contact Fair  Facial Expression Animated  Affect Appropriate to circumstance  Speech Logical/coherent  Interaction Assertive  Motor Activity Other (Comment) (WNL)  Appearance/Hygiene Unremarkable  Behavior Characteristics Appropriate to situation  Mood Anxious  Thought Process  Coherency WDL  Content WDL  Delusions None reported or observed  Perception WDL  Hallucination None reported or observed  Judgment WDL  Confusion None  Danger to Self  Current suicidal ideation? Passive  Agreement Not to Harm Self Yes  Description of Agreement VERBAL  Danger to Others  Danger to Others None reported or observed

## 2022-07-28 DIAGNOSIS — F332 Major depressive disorder, recurrent severe without psychotic features: Secondary | ICD-10-CM | POA: Diagnosis not present

## 2022-07-28 MED ORDER — SERTRALINE HCL 25 MG PO TABS
75.0000 mg | ORAL_TABLET | Freq: Every day | ORAL | Status: DC
  Administered 2022-07-29 – 2022-07-30 (×2): 75 mg via ORAL
  Filled 2022-07-28 (×3): qty 3

## 2022-07-28 NOTE — BHH Group Notes (Signed)
Pt attended the group today and participated in discussion.

## 2022-07-28 NOTE — BHH Counselor (Signed)
CSW spoke with the Pt about discharge planning. The Pt states that he would like to go to Residential treatment.  The Pt states that he has Medicaid out of [place].  CSW explained to the Pt that most [place] faculties do not accepted [place] and that he would have to go back to [place] for residential treatment.  The Pt states that he does not want to return to [place] and asked about having his Medicaid changed to [place].  CSW explained that this was a process that could take several weeks and would not occur during his time in the hospital.  The Pt states that he lives in [place], [place] and would like his appointments in that area or the [place] area.  CSW provided the Pt with a list of treatment centers (also includes [facility] and [facility]).

## 2022-07-28 NOTE — Progress Notes (Signed)
   07/28/22 2992  15 Minute Checks  Location Dayroom  Visual Appearance Calm  Behavior Composed  Sleep (Behavioral Health Patients Only)  Calculate sleep? (Click Yes once per 24 hr at 0600 safety check) Yes  Documented sleep last 24 hours 8

## 2022-07-28 NOTE — BHH Suicide Risk Assessment (Signed)
[place] INPATIENT:  Family/Significant Other Suicide Prevention Education  Suicide Prevention Education:  Patient Refusal for Family/Significant Other Suicide Prevention Education: The patient Arthur Singleton has refused to provide written consent for family/significant other to be provided Family/Significant Other Suicide Prevention Education during admission and/or prior to discharge.  Physician notified.  Frutoso Chase Ramonica Grigg 07/28/2022, 10:05 AM

## 2022-07-28 NOTE — Group Note (Signed)
LCSW Group Therapy Note   Group Date: 07/28/2022 Start Time: 1100 End Time: 1200  Type of Therapy and Topic: Group Therapy: Effective Communication   Participation Level: Did not attend    Description of Group:  In this group patients will be asked to identify their own styles of communication as well as defining and identifying passive, assertive, and aggressive styles of communication. Participants will identify strategies to communicate in a more assertive manner in an effort to appropriately meet their needs. This group will be process-oriented, with patients participating in exploration of their own experiences as well as giving and receiving support and challenge from other group members.   Therapeutic Goals: 1. Patient will identify their personal communication style. 2. Patient will identify passive, assertive, and aggressive forms of communication. 3. Patient will identify strategies for developing more effective communication to appropriately meet their needs.    Summary of Patient Progress: Did not attend     Therapeutic Modalities:  Communication Skills Solution Focused Therapy Motivational Interviewing  Darleen Crocker, [place] 07/28/2022  1:58 PM

## 2022-07-28 NOTE — Progress Notes (Signed)
Adult Psychoeducational Group Note  Date:  07/28/2022 Time:  9:31 PM  Group Topic/Focus:  Wrap-Up Group:   The focus of this group is to help patients review their daily goal of treatment and discuss progress on daily workbooks.  Participation Level:  Active  Participation Quality:  Appropriate  Affect:  Appropriate  Cognitive:  Appropriate  Insight: Appropriate  Engagement in Group:  Engaged  Modes of Intervention:  Discussion  Additional Comments:   Pt states that he had a good day and was able to attend groups and talk with his doctor today. Pt endorsed some feelings of anxiety, depression, and passive SI. Pt wants to develop better coping skills for Si. Nurse notified   Gerhard Perches 07/28/2022, 9:31 PM

## 2022-07-28 NOTE — Progress Notes (Signed)
   07/28/22 0800  Psych Admission Type (Psych Patients Only)  Admission Status Voluntary  Psychosocial Assessment  Patient Complaints Anxiety  Eye Contact Fair  Facial Expression Animated  Affect Appropriate to circumstance  Speech Logical/coherent  Interaction Assertive  Motor Activity Other (Comment) (WDL)  Appearance/Hygiene Unremarkable  Behavior Characteristics Appropriate to situation  Mood Anxious  Thought Process  Coherency WDL  Content WDL  Delusions None reported or observed  Perception WDL  Hallucination None reported or observed  Judgment WDL  Confusion None  Danger to Self  Current suicidal ideation? Passive  Self-Injurious Behavior No self-injurious ideation or behavior indicators observed or expressed   Agreement Not to Harm Self Yes  Description of Agreement verbal  Danger to Others  Danger to Others None reported or observed

## 2022-07-28 NOTE — BHH Counselor (Signed)
CSW provided the Pt with a packet that contains information including shelter and housing resources, free and reduced price food information, clothing resources, crisis center information, a [place] card, and suicide prevention information.  CSW provided the Pt with a list of residential treatment facilities within [facility].

## 2022-07-28 NOTE — Progress Notes (Addendum)
[facility] MD Progress Note  07/28/2022 1:39 PM Arthur Singleton  MRN:  [id number] Principal Problem: MDD (major depressive disorder), recurrent severe, without psychosis ([place]) Diagnosis: Principal Problem:   MDD (major depressive disorder), recurrent severe, without psychosis ([place]) Active Problems:   PTSD (post-traumatic stress disorder)   Generalized anxiety disorder   Bereavement   TBI (traumatic brain injury) ([place])   Cocaine use disorder, severe, in early remission, in controlled environment [facility])   [place]   Reason for Admission: Arthur Singleton is a 37 yo male w/ history of MDD, GAD, TBI, epilepsy presenting to [facility] from [facility] for claimed suicide attempt via medication overdose. This is hospitalization day 2.   Subjective:  Patient seen and assessed at bedside.  Patient reports continued sporadic passive SI but is able to contract for safety while in the milieu.  He states that his passive suicidal ideation is "less intense" than yesterday.  He denies HI/AVH.  Reports sleep has dramatically improved after restarting prazosin yesterday.  Denies nightmares last night.  Reports having anxiety and depression that are severe and depression being worse than his anxiety. He reports appetite has been appropriate.  He denies any GI symptoms.  He denies any somatic complaints from initiation of Topamax nor medication adjustments.  All questions were addressed and patient is agreeable with plan.  Patient request speaking with social worker today regarding dispo planning.  Objective:  Chart Review Past 24 hours of patient's chart was reviewed.  Patient is compliant with scheduled meds. Required Agitation PRNs: none Per RN notes, no documented behavioral issues and is not attending group. Patient slept, unknown hours  Total Time spent with patient: 45 minutes  Past Psychiatric History:  Previous diagnoses of MDD and GAD.  Previously reported last [facility] hospitalization was 10 years ago  before [facility] admission. Upon chart review, he was at [facility] and [place] in [place], [place] from 03/21/22-03/23/22 for [place], [facility] 02/11/22-02/16/22 in [place], [place] for [place], 10/26/21-10/28/21 in [place] center for [place]. 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 Psychiatric medication history: Most recently taking Prozac and lamotrigine, stopped 3 years ago.  Reports having a rash on lamotrigine in 2009, but this was restarted by a neurologist, and he took the lamotrigine for 10 years thereafter without any side effects or rash. Patient also reports taking Zoloft, Lexapro, Celexa, Paxil, Effexor, lithium ("feel funny"), Depakote ("got fatty liver disease"), Trileptal, Abilify, Risperdal, Seroquel in the past. 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   Pseudoseizures    questioned after normal EEG 08/2011, staging "seizure" to obtain narcotic pain meds   Seizures ([place])    Traumatic brain injury [facility])     Past Surgical History:  Procedure Laterality Date   BRAIN SURGERY     partial frontal lobectomy   LOBECTOMY     right arm growth plate     Family History:  Family History  Problem Relation Age of Onset   COPD Mother    Coronary artery disease Mother    Stroke Mother    Diabetes  Mother    Diabetes Father     Social History:  Social History   Substance and Sexual Activity  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, sober several years     Social History   Substance and Sexual Activity  Drug Use Not Currently   Types: Marijuana, Methamphetamines   Comment: not current, sober 2  weeks    Social History   Socioeconomic History   Marital status: Single    Spouse name: Not on file   Number of children: Not on file   Years of education: Not on file   Highest education level: Not on file  Occupational History   Not on file  Tobacco Use   Smoking status: Every Day    Packs/day: 1.00    Types: Cigarettes   Smokeless tobacco: Not on file  Substance and Sexual Activity   Alcohol use: Not Currently    Comment: hx, sober several years   Drug use: Not Currently    Types: Marijuana, Methamphetamines    Comment: not current, sober 2 weeks   Sexual activity: Yes    Birth control/protection: Condom  Other Topics Concern   Not on file  Social History Narrative   ** Merged History Encounter **       Social Determinants of Health   Financial Resource Strain: Not on file  Food Insecurity: No Food Insecurity (07/26/2022)   Hunger Vital Sign    Worried About Running Out of Food in the Last Year: Never true    Ran Out of Food in the Last Year: Never true  Transportation Needs: No Transportation Needs (07/26/2022)   PRAPARE - Hydrologist (Medical): No    Lack of Transportation (Non-Medical): No  Physical Activity: Not on file  Stress: Not on file  Social Connections: Not on file   Additional Social History:                         Current Medications: Current Facility-Administered Medications  Medication Dose Route Frequency Provider Last Rate Last Admin   acetaminophen (TYLENOL) tablet 650 mg  650 mg Oral Q6H PRN Motley-Mangrum, Jadeka A, PMHNP       alum & mag hydroxide-simeth (MAALOX/MYLANTA) 200-200-20 MG/5ML suspension 30 mL  30 mL Oral Q4H PRN Motley-Mangrum, Jadeka A, PMHNP       hydrOXYzine (ATARAX) tablet 50 mg  50 mg Oral TID PRN France Ravens, MD   50 mg at 07/28/22 0814   magnesium hydroxide (MILK OF MAGNESIA) suspension 30 mL  30 mL Oral Daily PRN Motley-Mangrum, Jadeka A, PMHNP       melatonin tablet 5 mg  5  mg Oral QHS France Ravens, MD   5 mg at 07/27/22 2132   prazosin (MINIPRESS) capsule 2 mg  2 mg Oral QHS France Ravens, MD   2 mg at 07/27/22 2132   sertraline (ZOLOFT) tablet 50 mg  50 mg Oral Daily France Ravens, MD   50 mg at 07/28/22 7616   topiramate (TOPAMAX) tablet 25 mg  25 mg Oral BID France Ravens, MD   25 mg at 07/28/22 0813   traZODone (DESYREL) tablet 50 mg  50 mg Oral QHS PRN Motley-Mangrum, Jadeka A, PMHNP   50 mg at 07/27/22 2156    Lab Results:  No results found for this or any previous visit (from the past 24 hour(s)).  Blood Alcohol level:  Lab Results  Component Value Date  ETH <10 07/25/2022   ETH <11 [id number]    Metabolic Disorder Labs: Lab Results  Component Value Date   HGBA1C 5.5 07/09/2022   MPG 111 07/09/2022   No results found for: "PROLACTIN" Lab Results  Component Value Date   CHOL 168 07/09/2022   TRIG 138 07/09/2022   HDL 31 (L) 07/09/2022   CHOLHDL 5.4 07/09/2022   VLDL 28 07/09/2022   LDLCALC 109 (H) 07/09/2022    Physical Findings:  Musculoskeletal: Strength & Muscle Tone: within normal limits Gait & Station: normal  Psychiatric Specialty Exam:  Presentation  General Appearance:  Appropriate for Environment; Disheveled   Eye Contact: Fair   Speech: Clear and Coherent; Normal Rate   Speech Volume: Normal   Handedness: Right    Mood and Affect  Mood: Depressed; Anxious   Affect: Flat    Thought Process  Thought Processes: Coherent; Goal Directed; Linear   Descriptions of Associations:Intact   Orientation:Full (Time, Place and Person)   Thought Content:Logical   History of Schizophrenia/Schizoaffective disorder:No data recorded  Duration of Psychotic Symptoms:No data recorded  Hallucinations:Hallucinations: None   Ideas of Reference:None   Suicidal Thoughts:Suicidal Thoughts: Yes, Passive   Homicidal Thoughts:Homicidal Thoughts: No    Sensorium  Memory: Immediate  Good   Judgment: Fair   Insight: Fair    Community education officer  Concentration: Fair   Attention Span: Fair   Recall: [employer] of Knowledge: Fair   Language: Fair    Psychomotor Activity  Psychomotor Activity: Psychomotor Activity: Normal    Assets  Assets: Communication Skills; Desire for Improvement; Social Support    Sleep  Sleep: Sleep: Good     Physical Exam: Review of Systems  Respiratory:  Negative for shortness of breath.   Cardiovascular:  Negative for chest pain.  Gastrointestinal:  Negative for abdominal pain, constipation, diarrhea, heartburn, nausea and vomiting.  Neurological:  Negative for headaches.   Blood pressure 110/62, pulse (!) 102, temperature 98 F (36.7 C), temperature source Oral, resp. rate (!) 22, height 5\' 10"  (1.778 m), weight 120.2 kg, SpO2 98 %. Body mass index is 38.02 kg/m.   ASSESSMENT AND PLAN Arthur Singleton is a 37 yo male w/ history of MDD, GAD, TBI, epilepsy presenting to [facility] from [facility] for claimed suicide attempt via medication overdose.This is hospitalization day 2.   PLAN Safety and Monitoring: voluntarily admission to inpatient psychiatric unit for safety, stabilization and treatment Daily contact with patient to assess and evaluate symptoms and progress in treatment Appropriate medication management to further stabilize patient Patient's case will be regularly discussed in multi-disciplinary team meeting Observation Level : q15 minute checks Vital signs: q12 hours Precautions: suicide, elopement, and assault   2. Psychiatric Problems MDD severe recurrent without psychotic features GAD PTSD Stimulant use disorder, in early remission Cannabis use disorder, in early remission Malingering TBI History of epilepsy -Continue zoloft 50 mg daily for depression -Continue hydroxyzine 50 mg tid prn for anxiety -Continue prazosin 2 mg qhs for ptsd nightmares             -monitor  BP -Continue topamax 25 mg bid for seizure prophylaxis and impulse control --The risks/benefits/side-effects/alternatives to this medication were discussed in detail with the patient and time was given for questions. The patient consents to medication trial.  -- Encouraged patient to participate in unit milieu and in scheduled group therapies    3. Medical Management Leucocytosis Nonspecific. Denies fever, cough, n/v, chills, malaise, diaphoresis.   PRN The following PRN medications  were added to ensure patient can focus on treatment. These were discussed with patient and patient aware of ability to ask for the following medications:  -Tylenol 650 mg q6hr PRN for mild pain -Mylanta 30 ml suspension for indigestion -Milk of Magnesia 30 ml for constipation -Trazodone 50 mg qhs for insomnia -Hydroxyzine 25 mg tid PRN for anxiety   4. Discharge Planning Patient will require the following based on my assessment:  Greatly appreciate CSW and Case management assistance with facilitating these needs and any further recommendations regarding patient's needs upon discharge. Estimated LOS: 3-5 days Discharge Concerns: Need to establish a safety plan; Medication compliance and effectiveness Discharge Goals: Return home with outpatient referrals for mental health follow-up including medication management/psychotherapy     Long Term Goal(s): Minimizing disruption current psychiatric diagnosis is causing so that patient can be safely discharged Short Term Goals: Compliance with proposed treatment plan and adjusting to psychiatric unit and peers.     France Ravens, MD 07/28/2022, 1:39 PM

## 2022-07-28 NOTE — Plan of Care (Signed)
  Problem: Education: Goal: Knowledge of [facility] Education information/materials will improve Outcome: Progressing Goal: Emotional status will improve Outcome: Progressing Goal: Mental status will improve Outcome: Progressing Goal: Verbalization of understanding the information provided will improve Outcome: Progressing   Problem: Activity: Goal: Interest or engagement in activities will improve Outcome: Progressing Goal: Sleeping patterns will improve Outcome: Progressing   Problem: Coping: Goal: Ability to verbalize frustrations and anger appropriately will improve Outcome: Progressing Goal: Ability to demonstrate self-control will improve Outcome: Progressing   

## 2022-07-29 DIAGNOSIS — F332 Major depressive disorder, recurrent severe without psychotic features: Secondary | ICD-10-CM | POA: Diagnosis not present

## 2022-07-29 MED ORDER — NICOTINE POLACRILEX 2 MG MT GUM
2.0000 mg | CHEWING_GUM | OROMUCOSAL | Status: DC | PRN
  Administered 2022-07-29 – 2022-07-31 (×4): 2 mg via ORAL
  Filled 2022-07-29 (×4): qty 1

## 2022-07-29 MED ORDER — PRAZOSIN HCL 1 MG PO CAPS
3.0000 mg | ORAL_CAPSULE | Freq: Every day | ORAL | Status: DC
  Administered 2022-07-29 – 2022-07-30 (×2): 3 mg via ORAL
  Filled 2022-07-29 (×4): qty 3

## 2022-07-29 NOTE — Progress Notes (Signed)
[facility] MD Progress Note  07/29/2022 9:46 AM Arthur Singleton  MRN:  [id number] Principal Problem: MDD (major depressive disorder), recurrent severe, without psychosis (HCC) Diagnosis: Principal Problem:   MDD (major depressive disorder), recurrent severe, without psychosis (HCC) Active Problems:   PTSD (post-traumatic stress disorder)   Generalized anxiety disorder   Bereavement   TBI (traumatic brain injury) (HCC)   Cocaine use disorder, severe, in early remission, in controlled environment [facility])   Malingering   Reason for Admission: Arthur Singleton is a 37 yo male w/ history of MDD, GAD, TBI, epilepsy presenting to [facility] from [facility] for claimed suicide attempt via medication overdose. This is hospitalization day 3.   Subjective:  Patient seen and assessed at bedside.  Patient reports continued sporadic passive SI but is able to contract for safety while in the milieu. He denies HI/AVH. He continues to have sporadic nightmares and endorses nightmares last night, however, sleep has been fair. Mood feels unchanged from yesterday, continues to feel depressed. He reports appetite has been appropriate.  He denies any GI symptoms.  He denies any somatic complaints from initiation of Topamax nor medication adjustments.  All questions were addressed and patient is agreeable with plan.  Patient plans to return home to [place] via bus through [place]. He requests transport to [facility] so he can take a bus home to [place].   Objective:  Chart Review Past 24 hours of patient's chart was reviewed.  Patient is compliant with scheduled meds. Required Agitation PRNs: none Per RN notes, no documented behavioral issues and is not attending group. Patient slept, unknown hours  Total Time spent with patient: 45 minutes  Past Psychiatric History:  Previous diagnoses of MDD and GAD.  Previously reported last [facility] hospitalization was 10 years ago before [facility] admission. Upon chart review, he was at  [facility] in [place], [place] from 03/21/22-03/23/22 for SI, [facility] 02/11/22-02/16/22 in [place], [place] for [place], 10/26/21-10/28/21 in [facility] for SI. 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 Psychiatric medication history: Most recently taking Prozac and lamotrigine, stopped 3 years ago.  Reports having a rash on lamotrigine in 2009, but this was restarted by a neurologist, and he took the lamotrigine for 10 years thereafter without any side effects or rash. Patient also reports taking Zoloft, Lexapro, Celexa, Paxil, Effexor, lithium ("feel funny"), Depakote ("got fatty liver disease"), Trileptal, Abilify, Risperdal, Seroquel in the past. 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   Pseudoseizures    questioned after normal EEG 08/2011, staging "seizure" to obtain narcotic pain meds   Seizures (HCC)    Traumatic brain injury [facility])     Past Surgical History:  Procedure Laterality Date   BRAIN SURGERY     partial frontal lobectomy   LOBECTOMY     right arm growth plate     Family History:  Family History  Problem Relation Age of Onset   COPD Mother    Coronary artery disease Mother    Stroke Mother    Diabetes Mother  Diabetes Father     Social History:  Social History   Substance and Sexual Activity  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, sober several years     Social History   Substance and Sexual Activity  Drug Use Not Currently   Types: Marijuana, Methamphetamines   Comment: not current, sober 2 weeks    Social History   Socioeconomic History    Marital status: Single    Spouse name: Not on file   Number of children: Not on file   Years of education: Not on file   Highest education level: Not on file  Occupational History   Not on file  Tobacco Use   Smoking status: Every Day    Packs/day: 1.00    Types: Cigarettes   Smokeless tobacco: Not on file  Substance and Sexual Activity   Alcohol use: Not Currently    Comment: hx, sober several years   Drug use: Not Currently    Types: Marijuana, Methamphetamines    Comment: not current, sober 2 weeks   Sexual activity: Yes    Birth control/protection: Condom  Other Topics Concern   Not on file  Social History Narrative   ** Merged History Encounter **       Social Determinants of Health   Financial Resource Strain: Not on file  Food Insecurity: No Food Insecurity (07/26/2022)   Hunger Vital Sign    Worried About Running Out of Food in the Last Year: Never true    Ran Out of Food in the Last Year: Never true  Transportation Needs: No Transportation Needs (07/26/2022)   PRAPARE - Administrator, Civil Service (Medical): No    Lack of Transportation (Non-Medical): No  Physical Activity: Not on file  Stress: Not on file  Social Connections: Not on file   Additional Social History:                         Current Medications: Current Facility-Administered Medications  Medication Dose Route Frequency Provider Last Rate Last Admin   acetaminophen (TYLENOL) tablet 650 mg  650 mg Oral Q6H PRN Motley-Mangrum, Jadeka A, PMHNP       alum & mag hydroxide-simeth (MAALOX/MYLANTA) 200-200-20 MG/5ML suspension 30 mL  30 mL Oral Q4H PRN Motley-Mangrum, Jadeka A, PMHNP       hydrOXYzine (ATARAX) tablet 50 mg  50 mg Oral TID PRN Park Pope, MD   50 mg at 07/29/22 0820   magnesium hydroxide (MILK OF MAGNESIA) suspension 30 mL  30 mL Oral Daily PRN Motley-Mangrum, Jadeka A, PMHNP       melatonin tablet 5 mg  5 mg Oral QHS Park Pope, MD   5 mg at 07/28/22 2120    prazosin (MINIPRESS) capsule 3 mg  3 mg Oral QHS Park Pope, MD       sertraline (ZOLOFT) tablet 75 mg  75 mg Oral Daily Massengill, Harrold Donath, MD   75 mg at 07/29/22 0817   topiramate (TOPAMAX) tablet 25 mg  25 mg Oral BID Park Pope, MD   25 mg at 07/29/22 0818   traZODone (DESYREL) tablet 50 mg  50 mg Oral QHS PRN Motley-Mangrum, Jadeka A, PMHNP   50 mg at 07/28/22 2120    Lab Results:  No results found for this or any previous visit (from the past 24 hour(s)).  Blood Alcohol level:  Lab Results  Component Value Date   ETH <10 07/25/2022   ETH <  11 67/67/2094    Metabolic Disorder Labs: Lab Results  Component Value Date   HGBA1C 5.5 07/09/2022   MPG 111 07/09/2022   No results found for: "PROLACTIN" Lab Results  Component Value Date   CHOL 168 07/09/2022   TRIG 138 07/09/2022   HDL 31 (L) 07/09/2022   CHOLHDL 5.4 07/09/2022   VLDL 28 07/09/2022   LDLCALC 109 (H) 07/09/2022    Physical Findings:  Musculoskeletal: Strength & Muscle Tone: within normal limits Gait & Station: normal  Psychiatric Specialty Exam: Presentation  General Appearance:  Appropriate for Environment; Disheveled  Eye Contact: Fair  Speech: Clear and Coherent; Slow  Speech Volume: Normal  Handedness: Right  Mood and Affect  Mood: Depressed  Affect: Appropriate; Congruent; Flat  Thought Process  Coherent; Goal Directed; Linear  Descriptions of Associations:Intact  Orientation:Full (Time, Place and Person)  Thought Content:Logical  History of Schizophrenia/Schizoaffective disorder: none  Hallucinations:Hallucinations: None  Ideas of Reference:None  Suicidal Thoughts:Suicidal Thoughts: Yes, Passive  Homicidal Thoughts:Homicidal Thoughts: No  Sensorium  Memory: Immediate Good  Judgment: Fair  Insight: Fair  Community education officer  Concentration: Fair  Attention Span: Fair  Recall: [employer] of Knowledge: Fair  Language: Fair  Psychomotor Activity   Psychomotor Activity: Normal  Animal nutritionist; Desire for Improvement; Social Support  Sleep  Sleep: Fair  Physical Exam: Review of Systems  Constitutional:  Negative for chills and fever.  Respiratory:  Negative for shortness of breath.   Cardiovascular:  Negative for chest pain.  Gastrointestinal:  Negative for abdominal pain, constipation, diarrhea, heartburn, nausea and vomiting.  Neurological:  Negative for headaches.   Blood pressure 114/75, pulse (!) 102, temperature 98.2 F (36.8 C), temperature source Oral, resp. rate (!) 22, height 5\' 10"  (1.778 m), weight 120.2 kg, SpO2 98 %. Body mass index is 38.02 kg/m.  ASSESSMENT AND PLAN Arthur Singleton is a 37 yo male w/ history of MDD, GAD, TBI, epilepsy presenting to [facility] from [facility] for claimed suicide attempt via medication overdose.This is hospitalization day 3.  PLAN Safety and Monitoring: voluntarily admission to inpatient psychiatric unit for safety, stabilization and treatment Daily contact with patient to assess and evaluate symptoms and progress in treatment Appropriate medication management to further stabilize patient Patient's case will be regularly discussed in multi-disciplinary team meeting Observation Level : q15 minute checks Vital signs: q12 hours Precautions: suicide, elopement, and assault   2. Psychiatric Problems MDD severe recurrent without psychotic features GAD PTSD Stimulant use disorder, in early remission Cannabis use disorder, in early remission Malingering TBI History of epilepsy -Continue zoloft 75 mg daily for depression -Continue hydroxyzine 50 mg tid prn for anxiety - Increase prazosin to 3 mg from 2 mg qhs for ptsd nightmares             -monitor BP -Continue topamax 25 mg bid for seizure prophylaxis and impulse control --The risks/benefits/side-effects/alternatives to this medication were discussed in detail with the patient and time was given for questions. The  patient consents to medication trial.  -- Encouraged patient to participate in unit milieu and in scheduled group therapies    3. Medical Management Leucocytosis Nonspecific. Denies fever, cough, n/v, chills, malaise, diaphoresis.   PRN The following PRN medications were added to ensure patient can focus on treatment. These were discussed with patient and patient aware of ability to ask for the following medications:  -Tylenol 650 mg q6hr PRN for mild pain -Mylanta 30 ml suspension for indigestion -Milk of Magnesia 30 ml  for constipation -Trazodone 50 mg qhs for insomnia -Hydroxyzine 25 mg tid PRN for anxiety   4. Discharge Planning Patient will require the following based on my assessment:  Greatly appreciate CSW and Case management assistance with facilitating these needs and any further recommendations regarding patient's needs upon discharge. Estimated LOS: 3-5 days Discharge Concerns: Need to establish a safety plan; Medication compliance and effectiveness Discharge Goals: Return home with outpatient referrals for mental health follow-up including medication management/psychotherapy     Long Term Goal(s): Minimizing disruption current psychiatric diagnosis is causing so that patient can be safely discharged Short Term Goals: Compliance with proposed treatment plan and adjusting to psychiatric unit and peers.  Arthur Singleton, Medical Student 07/29/2022, 9:46 AM  Attestation for Student Documentation:  I personally was present and performed or re-performed the history, physical exam and medical decision-making activities of this service and have verified that the service and findings are accurately documented in the student's note.  France Ravens, MD 07/29/2022, 1:17 PM

## 2022-07-29 NOTE — Plan of Care (Signed)
  Problem: Education: Goal: Knowledge of [facility] Education information/materials will improve Outcome: Progressing Goal: Emotional status will improve Outcome: Progressing Goal: Mental status will improve Outcome: Progressing Goal: Verbalization of understanding the information provided will improve Outcome: Progressing   Problem: Activity: Goal: Interest or engagement in activities will improve Outcome: Progressing Goal: Sleeping patterns will improve Outcome: Not Progressing   Problem: Coping: Goal: Ability to verbalize frustrations and anger appropriately will improve Outcome: Progressing Goal: Ability to demonstrate self-control will improve Outcome: Progressing

## 2022-07-29 NOTE — Group Note (Signed)
Recreation Therapy Group Note   Group Topic:Other  Group Date: 07/29/2022 Start Time: 1400 End Time: 1440 Facilitators: Dynastee Brummell-McCall, LRT,CTRS Location: [address] Dayroom   Goal Area(s) Addresses:  Patient will engage in pro-social way in music group.  Patient will follow directions of drum leader on the first prompt. Patient will demonstrate no behavioral issues during group.  Patient will identify if a reduction in stress level occurs as a result of participation in therapeutic drum circle.   Activity Description/Intervention: Therapeutic Drumming. Patients with peers and staff were given the opportunity to engage in a leader facilitated [place] with staff from the [employer], in partnership with [employer]. Nurse, adult and trained [employer], Devin Going leading with LRT observing and documenting intervention and pt response. This evidenced-based practice targets 7 areas of health and wellbeing in the human experience including: stress-reduction, exercise, self-expression, camaraderie/support, nurturing, spirituality, and music-making (leisure).    Affect/Mood: Appropriate   Participation Level: Engaged   Participation Quality: Independent   Behavior: Appropriate   Speech/Thought Process: Focused    Clinical Observations/Individualized Feedback: Patient actively engaged in therapeutic drumming exercise and discussions. Pt was appropriate with peers, staff, and musical equipment for duration of programming.      Plan: Continue to engage patient in RT group sessions 2-3x/week.   Arthur Singleton, LRT,CTRS 07/29/2022 3:47 PM

## 2022-07-29 NOTE — Plan of Care (Signed)
  Problem: Education: Goal: Knowledge of [facility] Education information/materials will improve 07/29/2022 0921 by Luanna Salk, RN Outcome: Progressing 07/29/2022 0918 by Luanna Salk, RN Outcome: Progressing Goal: Emotional status will improve 07/29/2022 0921 by Luanna Salk, RN Outcome: Progressing 07/29/2022 0918 by Luanna Salk, RN Outcome: Progressing Goal: Mental status will improve 07/29/2022 0921 by Luanna Salk, RN Outcome: Progressing 07/29/2022 0918 by Luanna Salk, RN Outcome: Progressing Goal: Verbalization of understanding the information provided will improve 07/29/2022 0921 by Luanna Salk, RN Outcome: Progressing 07/29/2022 0918 by Luanna Salk, RN Outcome: Progressing   Problem: Activity: Goal: Interest or engagement in activities will improve 07/29/2022 0921 by Luanna Salk, RN Outcome: Progressing 07/29/2022 0918 by Luanna Salk, RN Outcome: Progressing Goal: Sleeping patterns will improve 07/29/2022 0921 by Luanna Salk, RN Outcome: Progressing 07/29/2022 0918 by Luanna Salk, RN Outcome: Not Progressing   Problem: Coping: Goal: Ability to verbalize frustrations and anger appropriately will improve Outcome: Progressing Goal: Ability to demonstrate self-control will improve Outcome: Progressing

## 2022-07-29 NOTE — Progress Notes (Signed)
The patient attended the evening N.A.meeting and was appropriate.  

## 2022-07-29 NOTE — Progress Notes (Signed)
   07/28/22 2200  Psych Admission Type (Psych Patients Only)  Admission Status Voluntary  Psychosocial Assessment  Patient Complaints Anxiety  Eye Contact Fair  Facial Expression Animated  Affect Appropriate to circumstance  Speech Logical/coherent  Interaction Assertive  Motor Activity Slow  Appearance/Hygiene Improved  Behavior Characteristics Cooperative;Appropriate to situation;Calm  Mood Pleasant  Thought Process  Coherency WDL  Content WDL  Delusions None reported or observed  Perception WDL  Hallucination None reported or observed  Judgment WDL  Confusion None  Danger to Self  Current suicidal ideation? Denies  Self-Injurious Behavior No self-injurious ideation or behavior indicators observed or expressed   Agreement Not to Harm Self Yes  Description of Agreement verbal  Danger to Others  Danger to Others None reported or observed

## 2022-07-29 NOTE — Plan of Care (Signed)
  Problem: Education: Goal: Emotional status will improve Outcome: Progressing Goal: Verbalization of understanding the information provided will improve Outcome: Progressing   Problem: Coping: Goal: Ability to demonstrate self-control will improve Outcome: Progressing   Problem: Safety: Goal: Periods of time without injury will increase Outcome: Progressing   Problem: Coping: Goal: Coping ability will improve Outcome: Progressing   Problem: Safety: Goal: Ability to identify and utilize support systems that promote safety will improve Outcome: Progressing   Problem: Self-Concept: Goal: Will verbalize positive feelings about self Outcome: Progressing Goal: Level of anxiety will decrease Outcome: Progressing

## 2022-07-29 NOTE — Progress Notes (Signed)
Patient alert and oriented x4. Compliant with medications and has been attending groups. Denies SI/HI/AVH but endorses anxiety and depression. Rates anxiety 6/10 and depression 8/10. Contracts for safety. Will continue q79min checks and provide verbal encouragement

## 2022-07-30 DIAGNOSIS — F332 Major depressive disorder, recurrent severe without psychotic features: Secondary | ICD-10-CM | POA: Diagnosis not present

## 2022-07-30 MED ORDER — SERTRALINE HCL 100 MG PO TABS
100.0000 mg | ORAL_TABLET | Freq: Every day | ORAL | Status: DC
  Administered 2022-07-31: 100 mg via ORAL
  Filled 2022-07-30 (×3): qty 1

## 2022-07-30 MED ORDER — SERTRALINE HCL 25 MG PO TABS
25.0000 mg | ORAL_TABLET | Freq: Every day | ORAL | Status: AC
  Administered 2022-07-30: 25 mg via ORAL
  Filled 2022-07-30: qty 1

## 2022-07-30 NOTE — Progress Notes (Signed)
Patient endorses passive SI this shift, denies HI, AVH, patient reports anxiety 6 out of 10, and depression 6 out of 10. Patient stated, "I'm doing better today, trying to help other people." Patient tolerates scheduled medications without any reported side effects. Support and encouragement provided to patient. Patient verbally contracted for safety, Q 15 minutes safety checks ongoing.

## 2022-07-30 NOTE — Group Note (Signed)
LCSW Group Therapy Note   Group Date: 07/30/2022 Start Time: 1100 End Time: 1200  Type of Therapy and Topic:  Group Therapy:  Strengths Exploration  Participation Level: Active  Description of Group: This group allows individuals to explore their strengths, learn to use strengths in new ways to improve well-being. Strengths-based interventions involve identifying strengths, understanding how they are used, and learning new ways to apply them. Individuals will identify their strengths, and then explore their roles in different areas of life (relationships, professional life, and personal fulfillment). Individuals will think about ways in which they currently use their strengths, along with new ways they could begin using them.   Therapeutic Goals Patient will verbalize two of their strengths Patient will identify how their strengths are currently used Patient will identify two new ways to apply their strengths  Patients will create a plan to apply their strengths in their daily lives     Summary of Patient Progress:  The Pt attended group and remained there the entire time.  The Pt accepted all worksheets and materials that were provided.  The Pt participated well and was appropriate with their peers.  The Pt demonstrated an understanding of the discussion topic.    Therapeutic Modalities Cognitive Behavioral Therapy Motivational Interviewing  Darleen Crocker, Latanya Presser 07/30/2022  2:39 PM

## 2022-07-30 NOTE — Progress Notes (Signed)
Adult Psychoeducational Group Note  Date:  07/30/2022 Time:  8:24 PM  Group Topic/Focus:  Wrap-Up Group:   The focus of this group is to help patients review their daily goal of treatment and discuss progress on daily workbooks.  Participation Level:  Active  Participation Quality:  Appropriate  Affect:  Appropriate  Cognitive:  Appropriate  Insight: Appropriate  Engagement in Group:  Engaged  Modes of Intervention:  Discussion  Additional Comments:  Gerald Stabs said his day was  a 36. His goal was to stay out of bed. Coping skills breathing and focus ing on other things like something outside  Barbette Hair 07/30/2022, 8:24 PM

## 2022-07-30 NOTE — Progress Notes (Signed)
[facility] MD Progress Note  07/30/2022 4:25 PM Arthur Singleton  MRN:  [id number] Principal Problem: MDD (major depressive disorder), recurrent severe, without psychosis (HCC) Diagnosis: Principal Problem:   MDD (major depressive disorder), recurrent severe, without psychosis (HCC) Active Problems:   PTSD (post-traumatic stress disorder)   Generalized anxiety disorder   Bereavement   TBI (traumatic brain injury) (HCC)   Cocaine use disorder, severe, in early remission, in controlled environment [facility])   Malingering   Reason for Admission: Arthur Singleton is a 37 yo male w/ history of MDD, GAD, TBI, epilepsy presenting to [facility] from [facility] for claimed suicide attempt via medication overdose.    Subjective:  Patient reports less frequent and less intense SI. Reports that SI is chronic and approaching baseline. Reports medications and group therapy has helped with this.  Able to contract for safety while in the milieu. He denies HI/AVH.  Reports nightmares less with incr prazosin and reports better sleep.  Reports mood is better. Reports anxiety is less.  He reports appetite has been appropriate.  He denies any GI symptoms.  He denies any somatic complaints from initiation of Topamax or other medication adjustments.   Patient plans to return home to [place] via bus through [place]. He requests transport to [facility] so he can take a bus home to [place].   Objective:  Chart Review Past 24 hours of patient's chart was reviewed.  Patient is compliant with scheduled meds. Required Agitation PRNs: none Per RN notes, no documented behavioral issues and is not attending group. Patient slept, unknown hours  Total Time spent with patient: 45 minutes  Past Psychiatric History:  Previous diagnoses of MDD and GAD.  Previously reported last [facility] hospitalization was 10 years ago before [facility] admission. Upon chart review, he was at [facility] in [place], [place] from  03/21/22-03/23/22 for SI, [facility] 02/11/22-02/16/22 in [place], [place] for [place], 10/26/21-10/28/21 in [facility] for SI. 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 Psychiatric medication history: Most recently taking Prozac and lamotrigine, stopped 3 years ago.  Reports having a rash on lamotrigine in 2009, but this was restarted by a neurologist, and he took the lamotrigine for 10 years thereafter without any side effects or rash. Patient also reports taking Zoloft, Lexapro, Celexa, Paxil, Effexor, lithium ("feel funny"), Depakote ("got fatty liver disease"), Trileptal, Abilify, Risperdal, Seroquel in the past. 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   Pseudoseizures    questioned after normal EEG 08/2011, staging "seizure" to obtain narcotic pain meds   Seizures (HCC)    Traumatic brain injury [facility])     Past Surgical History:  Procedure Laterality Date   BRAIN SURGERY     partial frontal lobectomy   LOBECTOMY     right arm growth plate     Family History:  Family History  Problem Relation Age of Onset   COPD Mother    Coronary artery disease Mother    Stroke Mother    Diabetes Mother    Diabetes Father  Social History:  Social History   Substance and Sexual Activity  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, sober several years     Social History   Substance and Sexual Activity  Drug Use Not Currently   Types: Marijuana, Methamphetamines   Comment: not current, sober 2 weeks    Social History   Socioeconomic History   Marital status: Single    Spouse name: Not on file   Number of  children: Not on file   Years of education: Not on file   Highest education level: Not on file  Occupational History   Not on file  Tobacco Use   Smoking status: Every Day    Packs/day: 1.00    Types: Cigarettes   Smokeless tobacco: Not on file  Substance and Sexual Activity   Alcohol use: Not Currently    Comment: hx, sober several years   Drug use: Not Currently    Types: Marijuana, Methamphetamines    Comment: not current, sober 2 weeks   Sexual activity: Yes    Birth control/protection: Condom  Other Topics Concern   Not on file  Social History Narrative   ** Merged History Encounter **       Social Determinants of Health   Financial Resource Strain: Not on file  Food Insecurity: No Food Insecurity (07/26/2022)   Hunger Vital Sign    Worried About Running Out of Food in the Last Year: Never true    Ran Out of Food in the Last Year: Never true  Transportation Needs: No Transportation Needs (07/26/2022)   PRAPARE - Administrator, Civil Service (Medical): No    Lack of Transportation (Non-Medical): No  Physical Activity: Not on file  Stress: Not on file  Social Connections: Not on file   Additional Social History:                         Current Medications: Current Facility-Administered Medications  Medication Dose Route Frequency Provider Last Rate Last Admin   acetaminophen (TYLENOL) tablet 650 mg  650 mg Oral Q6H PRN Motley-Mangrum, Jadeka A, PMHNP       alum & mag hydroxide-simeth (MAALOX/MYLANTA) 200-200-20 MG/5ML suspension 30 mL  30 mL Oral Q4H PRN Motley-Mangrum, Jadeka A, PMHNP       hydrOXYzine (ATARAX) tablet 50 mg  50 mg Oral TID PRN Park Pope, MD   50 mg at 07/29/22 1847   magnesium hydroxide (MILK OF MAGNESIA) suspension 30 mL  30 mL Oral Daily PRN Motley-Mangrum, Jadeka A, PMHNP       melatonin tablet 5 mg  5 mg Oral QHS Park Pope, MD   5 mg at 07/29/22 2117   nicotine polacrilex (NICORETTE) gum 2 mg  2 mg Oral PRN Phineas Inches, MD   2 mg at 07/30/22 1621   prazosin (MINIPRESS) capsule 3 mg  3 mg Oral QHS Park Pope, MD   3 mg at 07/29/22 2116   [START ON 07/31/2022] sertraline (ZOLOFT) tablet 100 mg  100 mg Oral Daily Quinn Bartling, Harrold Donath, MD       topiramate (TOPAMAX) tablet 25 mg  25 mg Oral BID Park Pope, MD   25 mg at 07/30/22 1621   traZODone (DESYREL) tablet 50 mg  50 mg Oral QHS PRN Motley-Mangrum, Jadeka A, PMHNP   50 mg at 07/28/22 2120    Lab Results:  No results found for this or any previous visit (from the past 24  hour(s)).  Blood Alcohol level:  Lab Results  Component Value Date   [facility] <10 07/25/2022   ETH <11 40/98/1191    Metabolic Disorder Labs: Lab Results  Component Value Date   HGBA1C 5.5 07/09/2022   MPG 111 07/09/2022   No results found for: "PROLACTIN" Lab Results  Component Value Date   CHOL 168 07/09/2022   TRIG 138 07/09/2022   HDL 31 (L) 07/09/2022   CHOLHDL 5.4 07/09/2022   VLDL 28 07/09/2022   LDLCALC 109 (H) 07/09/2022    Physical Findings:  Musculoskeletal: Strength & Muscle Tone: within normal limits Gait & Station: normal  Psychiatric Specialty Exam: Presentation  General Appearance:  Appropriate for Environment; Disheveled  Eye Contact: Fair  Speech: Clear and Coherent;   Speech Volume: Normal  Handedness: Right  Mood and Affect  Mood: Euthymic   Affect: Appropriate;   Thought Process  Coherent; Goal Directed; Linear  Descriptions of Associations:Intact  Orientation:Full (Time, Place and Person)  Thought Content:Logical  History of Schizophrenia/Schizoaffective disorder: none  Hallucinations:Hallucinations: None  Ideas of Reference:None  Suicidal Thoughts:Suicidal Thoughts: Yes, Passive  Homicidal Thoughts:Homicidal Thoughts: No  Sensorium  Memory: Immediate Good  Judgment: Fair  Insight: Fair  Community education officer  Concentration: Fair  Attention Span: Fair  Recall: [employer] of  Knowledge: Fair  Language: Fair  Psychomotor Activity  Psychomotor Activity: Normal  Animal nutritionist; Desire for Improvement; Social Support  Sleep  Sleep: Fair  Physical Exam: Review of Systems  Constitutional:  Negative for chills and fever.  Respiratory:  Negative for shortness of breath.   Cardiovascular:  Negative for chest pain.  Gastrointestinal:  Negative for abdominal pain, constipation, diarrhea, heartburn, nausea and vomiting.  Neurological:  Negative for headaches.  Psychiatric/Behavioral:  Positive for substance abuse and suicidal ideas. Negative for hallucinations. The patient is nervous/anxious. The patient does not have insomnia.    Blood pressure (!) 134/97, pulse 88, temperature 98 F (36.7 C), temperature source Oral, resp. rate (!) 22, height 5\' 10"  (1.778 m), weight 120.2 kg, SpO2 100 %. Body mass index is 38.02 kg/m.  ASSESSMENT AND PLAN Arthur Singleton is a 37 yo male w/ history of MDD, GAD, TBI, epilepsy presenting to [facility] from [facility] for claimed suicide attempt via medication overdose.This is hospitalization day 4.  PLAN Safety and Monitoring: voluntarily admission to inpatient psychiatric unit for safety, stabilization and treatment Daily contact with patient to assess and evaluate symptoms and progress in treatment Appropriate medication management to further stabilize patient Patient's case will be regularly discussed in multi-disciplinary team meeting Observation Level : q15 minute checks Vital signs: q12 hours Precautions: suicide, elopement, and assault   2. Psychiatric Problems MDD severe recurrent without psychotic features GAD PTSD Stimulant use disorder, in early remission Cannabis use disorder, in early remission Malingering TBI History of epilepsy -Increase zoloft from 75 mg to 100 mg daily for depression -Continue hydroxyzine 50 mg tid prn for anxiety - Continue prazosin 3 mg from 2 mg qhs for ptsd nightmares              -monitor BP -Continue topamax 25 mg bid for seizure prophylaxis and impulse control  --The risks/benefits/side-effects/alternatives to this medication were discussed in detail with the patient and time was given for questions. The patient consents to medication trial.  -- Encouraged patient to participate in unit milieu and in scheduled group therapies    3. Medical Management Leucocytosis Nonspecific. Denies fever, cough, n/v, chills, malaise, diaphoresis.  -PRN nicotine gum  PRN The following PRN medications were added to ensure patient can focus on treatment. These were discussed with patient and patient aware of ability to ask for the following medications:  -Tylenol 650 mg q6hr PRN for mild pain -Mylanta 30 ml suspension for indigestion -Milk of Magnesia 30 ml for constipation -Trazodone 50 mg qhs for insomnia -Hydroxyzine 25 mg tid PRN for anxiety   4. Discharge Planning Patient will require the following based on my assessment:  Greatly appreciate CSW and Case management assistance with facilitating these needs and any further recommendations regarding patient's needs upon discharge. Estimated LOS: 3-5 days Discharge Concerns: Need to establish a safety plan; Medication compliance and effectiveness Discharge Goals: Return home with outpatient referrals for mental health follow-up including medication management/psychotherapy     Christoper Allegra, MD 07/30/2022, 4:25 PM  Total Time Spent in Direct Patient Care:  I personally spent 35 minutes on the unit in direct patient care. The direct patient care time included face-to-face time with the patient, reviewing the patient's chart, communicating with other professionals, and coordinating care. Greater than 50% of this time was spent in counseling or coordinating care with the patient regarding goals of hospitalization, psycho-education, and discharge planning needs.   Janine Limbo, MD Psychiatrist

## 2022-07-30 NOTE — Progress Notes (Addendum)
Pt presents with pleasant mood,affect congruent. Arthur Singleton reports '' I'm doing better, I don't know what it is, but it's working . '' Pt reports he feels current medication regiment has helped his mood, and he feels less depressed and down. Pt states he has slept well last night with increase in his prazosin and no reported nightmares. Pt has reported he has attended programming and states '' I'm just looking forward to talking to the doctor about the next steps. '' Pt does report passive SI but no active thoughts to end life or harm self. Pt states '' it's still there but it's better. ''Pt did complete self inventory and rates his anxiety, depression and hopelessness at 6/10 on scale, 10 being worst 0 being none. Pt compliant with medications, able to make his needs known. Pt is safe.

## 2022-07-30 NOTE — Progress Notes (Signed)
   07/30/22 2300  Psych Admission Type (Psych Patients Only)  Admission Status Voluntary  Psychosocial Assessment  Patient Complaints Anxiety;Depression;Irritability  Eye Contact Fair  Facial Expression Animated  Affect Appropriate to circumstance  Speech Logical/coherent  Interaction Assertive  Motor Activity Slow  Appearance/Hygiene Unremarkable  Behavior Characteristics Cooperative;Appropriate to situation  Mood Depressed  Thought Process  Coherency WDL  Content WDL  Delusions WDL  Perception WDL  Hallucination None reported or observed  Judgment WDL  Confusion WDL  Danger to Self  Current suicidal ideation? Denies (Denies)  Self-Injurious Behavior No self-injurious ideation or behavior indicators observed or expressed   Agreement Not to Harm Self Yes  Description of Agreement verbal  Danger to Others  Danger to Others None reported or observed   Alert/oriented. Makes needs/concerns known to staff. Cooperative with staff. Denies SI/HI/A/V hallucinations. Med compliant. PRN med given with good effect. Patient states went to group. Will encourage continue compliance and progression towards goals. Verbally contracted for safety. Will continue to monitor.

## 2022-07-30 NOTE — Progress Notes (Signed)
Chaplain engaged in an initial visit with Arthur Singleton.  Arthur Singleton shared that the months of November - December can be hard for him.  He shared that his mom's birthday is three days after Christmas.  He lost her to cancer some years ago.  His dad also passed from cancer.  Chaplain affirmed his present grief and how that can become exacerbated by the holiday months.  Arthur Singleton also stated that being away from his nuclear family during the holiday season this year was harmful for him.  He traveled to [place] and found that the lack of relationship with his family here may have been triggering for him.  He later detailed that he knows he needs the support of his surrounding family where his home is and will be intentional about staying with them next year.    Chaplain asked Arthur Singleton about the coping skills that exist for him.  Chaplain assessed that he is very knowledgeable and self-aware of what to do and what to say.  He shared about being in treatment over the last 20 years or so, and knowing all the things that he can do to cope.  Chaplain then asked him about the implementation of the tools he has and he shared that "stubbornness" keeps him from utilizing them.  Chaplain asked Arthur Singleton about his consistency in using therapeutic resources and taking his medicine regularly, and he noted that he didn't have any issues with those things.  Chaplain provided him space to safely share the truth but found that his chart noted that he had not seen a therapist consistently in years.  Chaplain assessed some need to showcase his progress or readiness to be released.    Chaplain assessed that Arthur Singleton knew "the right answers" to provide but found that there are more layers to him.  He was able to share about a history of mental illness in his family and as a youth seeing the [place] G[place] of his family barricade herself.  The family had to intervene.  He talked about the ways in which his siblings have either  died or been "lost" to mental illness or a major event in some shape or form.  Chaplain offered reflective listening during this time, providing intentionality around questions and statements.    Arthur Singleton noted that he desires to take care of himself, and while there is some suicidal ideation present, he does want to live.  He did take a pause at the moment to assess if he desires life.  Chaplain worked to [employer] name some ways that he can take care of himself.  He voiced wanted to take a vacation out of the counter to [place] where he has some family.    Chaplain provided a supportive presence, normalized grief, provided space for reflection, and offered support.     07/30/22 1200  Spiritual Encounters  Type of Visit Initial  Care provided to: Patient  Reason for visit Grief/loss  Spiritual Framework  Presenting Themes Significant life change;Impactful experiences and emotions;Coping tools  Community/Connection Family;Friend(s)  Needs/Challenges/Barriers Implementation  Patient Stress Factors Loss  Interventions  Spiritual Care Interventions Made Supported grief process;Provided grief education;Reflective listening;Compassionate presence;Established relationship of care and support;Normalization of emotions;Narrative/life review  Intervention Outcomes  Outcomes Awareness of support;Autonomy/agency

## 2022-07-30 NOTE — Progress Notes (Signed)
   07/30/22 0640  15 Minute Checks  Location Hallway  Visual Appearance Calm  Behavior Composed  Sleep (Behavioral Health Patients Only)  Calculate sleep? (Click Yes once per 24 hr at 0600 safety check) Yes  Documented sleep last 24 hours 9.75

## 2022-07-31 DIAGNOSIS — F332 Major depressive disorder, recurrent severe without psychotic features: Secondary | ICD-10-CM

## 2022-07-31 MED ORDER — SERTRALINE HCL 100 MG PO TABS: 100.0000 mg | ORAL_TABLET | Freq: Every day | ORAL | 0 refills | Status: AC

## 2022-07-31 MED ORDER — WHITE PETROLATUM EX OINT
TOPICAL_OINTMENT | CUTANEOUS | Status: AC
  Filled 2022-07-31: qty 5

## 2022-07-31 MED ORDER — HYDROXYZINE HCL 50 MG PO TABS: 50.0000 mg | ORAL_TABLET | Freq: Three times a day (TID) | ORAL | 0 refills | Status: AC | PRN

## 2022-07-31 MED ORDER — NICOTINE POLACRILEX 2 MG MT GUM: 2.0000 mg | CHEWING_GUM | OROMUCOSAL | 0 refills | Status: AC | PRN

## 2022-07-31 MED ORDER — PRAZOSIN HCL 1 MG PO CAPS: 3.0000 mg | ORAL_CAPSULE | Freq: Every day | ORAL | 0 refills | Status: AC

## 2022-07-31 MED ORDER — TRAZODONE HCL 50 MG PO TABS: 50.0000 mg | ORAL_TABLET | Freq: Every evening | ORAL | 0 refills | Status: AC | PRN

## 2022-07-31 MED ORDER — TOPIRAMATE 25 MG PO TABS: 25.0000 mg | ORAL_TABLET | Freq: Two times a day (BID) | ORAL | 0 refills | Status: AC

## 2022-07-31 NOTE — Progress Notes (Signed)
Patient is discharging at this time. Patient is A&Ox4. Vs stable. Patient denies SI,HI, and A/V/H with no plan/intent. Printed AVS reviewed with and given to patient along with medications, bus passes,and follow up appointments. Patient verbalized all understanding. All valuables/belongings returned to patient. Patient denies any pain/discomfort. No s/s of current distress.

## 2022-07-31 NOTE — Plan of Care (Signed)
  Problem: Education: Goal: Knowledge of [address] [facility] Education information/materials will improve Outcome: Progressing Goal: Emotional status will improve Outcome: Progressing Goal: Mental status will improve Outcome: Progressing Goal: Verbalization of understanding the information provided will improve Outcome: Progressing   Problem: Activity: Goal: Interest or engagement in activities will improve Outcome: Progressing Goal: Sleeping patterns will improve Outcome: Progressing   Problem: Coping: Goal: Ability to verbalize frustrations and anger appropriately will improve Outcome: Progressing Goal: Ability to demonstrate self-control will improve Outcome: Progressing   Problem: Health Behavior/Discharge Planning: Goal: Identification of resources available to assist in meeting health care needs will improve Outcome: Progressing Goal: Compliance with treatment plan for underlying cause of condition will improve Outcome: Progressing   Problem: Physical Regulation: Goal: Ability to maintain clinical measurements within normal limits will improve Outcome: Progressing   Problem: Safety: Goal: Periods of time without injury will increase Outcome: Progressing   Problem: Education: Goal: Utilization of techniques to improve thought processes will improve Outcome: Progressing Goal: Knowledge of the prescribed therapeutic regimen will improve Outcome: Progressing   Problem: Activity: Goal: Interest or engagement in leisure activities will improve Outcome: Progressing Goal: Imbalance in normal sleep/wake cycle will improve Outcome: Progressing   Problem: Coping: Goal: Coping ability will improve Outcome: Progressing Goal: Will verbalize feelings Outcome: Progressing   Problem: Health Behavior/Discharge Planning: Goal: Ability to make decisions will improve Outcome: Progressing Goal: Compliance with therapeutic regimen will improve Outcome: Progressing    Problem: Role Relationship: Goal: Will demonstrate positive changes in social behaviors and relationships Outcome: Progressing   Problem: Safety: Goal: Ability to disclose and discuss suicidal ideas will improve Outcome: Progressing Goal: Ability to identify and utilize support systems that promote safety will improve Outcome: Progressing   Problem: Self-Concept: Goal: Will verbalize positive feelings about self Outcome: Progressing Goal: Level of anxiety will decrease Outcome: Progressing   

## 2022-07-31 NOTE — Progress Notes (Signed)
  [facility] Adult Case Management Discharge Plan :  Will you be returning to the same living situation after discharge:  Yes,  Own home in [place] At discharge, do you have transportation home?: Yes,  [employer] Bus Passes  Do you have the ability to pay for your medications: Yes,  Out-of State Medicaid   Release of information consent forms completed and in the chart;  Patient's signature needed at discharge.  Patient to Follow up at:  Follow-up Information     [facility]. Schedule an appointment as soon as possible for a visit.   Why: Please call or go to this provider to schedule an appointment for therapy and medication management services. Contact information: [address] [place], [place] [address]  P: [phone] F: [phone]                Next level of care provider has access to [place] and Suicide Prevention discussed: Yes,  with patient      Has patient been referred to the Quitline?: Patient refused referral  Patient has been referred for addiction treatment: Hamer, Winnie 07/31/2022, 9:23 AM

## 2022-07-31 NOTE — Discharge Summary (Signed)
Physician Discharge Summary Note  Patient:  Arthur Singleton is an 37 y.o., male MRN:  [id number] DOB:  01-05-1986 Patient phone:  [phone] (home)  Patient address:   [address] [place] [address],  Total Time spent with patient: 15 minutes  Date of Admission:  07/26/2022 Date of Discharge: 07-31-2022  Reason for Admission:  ***  Principal Problem: MDD (major depressive disorder), recurrent severe, without psychosis ([place]) Discharge Diagnoses: Principal Problem:   MDD (major depressive disorder), recurrent severe, without psychosis ([place]) Active Problems:   PTSD (post-traumatic stress disorder)   Generalized anxiety disorder   Bereavement   TBI (traumatic brain injury) ([place])   Cocaine use disorder, severe, in early remission, in controlled environment ([place])   Malingering   Past Psychiatric History: ***  Past Medical History:  Past Medical History:  Diagnosis Date   Anxiety    Bipolar 1 disorder ([place])    Brain injury ([place])    Drug-seeking behavior    History of electroencephalogram 08/2011   normal EEG   Hyperlipemia    Hypertension    Malingering    Migraine    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   Pseudoseizures    questioned after normal EEG 08/2011, staging "seizure" to obtain narcotic pain meds   Seizures ([place])    Traumatic brain injury ([place])     Past Surgical History:  Procedure Laterality Date   BRAIN SURGERY     partial frontal lobectomy   LOBECTOMY     right arm growth plate     Family History:  Family History  Problem Relation Age of Onset   COPD Mother    Coronary artery disease Mother    Stroke Mother    Diabetes Mother    Diabetes Father    Family Psychiatric  History: *** Social History:  Social History   Substance and Sexual Activity  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, sober several years     Social History   Substance and Sexual Activity  Drug Use Not Currently   Types: Marijuana,  Methamphetamines   Comment: not current, sober 2 weeks    Social History   Socioeconomic History   Marital status: Single    Spouse name: Not on file   Number of children: Not on file   Years of education: Not on file   Highest education level: Not on file  Occupational History   Not on file  Tobacco Use   Smoking status: Every Day    Packs/day: 1.00    Types: Cigarettes   Smokeless tobacco: Not on file  Substance and Sexual Activity   Alcohol use: Not Currently    Comment: hx, sober several years   Drug use: Not Currently    Types: Marijuana, Methamphetamines    Comment: not current, sober 2 weeks   Sexual activity: Yes    Birth control/protection: Condom  Other Topics Concern   Not on file  Social History Narrative   ** Merged History Encounter **       Social Determinants of Health   Financial Resource Strain: Not on file  Food Insecurity: No Food Insecurity (07/26/2022)   Hunger Vital Sign    Worried About Running Out of Food in the Last Year: Never true    Ran Out of Food in the Last Year: Never true  Transportation Needs: No Transportation Needs (07/26/2022)   PRAPARE - Hydrologist (Medical): No  Lack of Transportation (Non-Medical): No  Physical Activity: Not on file  Stress: Not on file  Social Connections: Not on file    Hospital Course:  ***  Physical Findings: AIMS:  , ,  ,  ,    CIWA:    COWS:     Musculoskeletal: Strength & Muscle Tone: {desc; muscle tone:32375} Gait & Station: {PE GAIT ED IWPY:09983} Patient leans: {Patient Leans:21022755}   Psychiatric Specialty Exam:  Presentation  General Appearance:  Appropriate for Environment; Casual; Fairly Groomed  Eye Contact: Fair  Speech: Clear and Coherent; Normal Rate  Speech Volume: Normal  Handedness: Right   Mood and Affect  Mood: Euthymic  Affect: Appropriate; Congruent; Full Range   Thought Process  Thought  Processes: Linear  Descriptions of Associations:Intact  Orientation:Full (Time, Place and Person)  Thought Content:Logical  History of Schizophrenia/Schizoaffective disorder:No data recorded Duration of Psychotic Symptoms:No data recorded Hallucinations:Hallucinations: None  Ideas of Reference:None  Suicidal Thoughts:Suicidal Thoughts: No  Homicidal Thoughts:Homicidal Thoughts: No   Sensorium  Memory: Immediate Good; Recent Good; Remote Good  Judgment: Fair  Insight: Fair   Chartered certified accountant: Fair  Attention Span: Fair  Recall: [employer] of Knowledge: Fair  Language: Fair   Psychomotor Activity  Psychomotor Activity: Psychomotor Activity: Normal   Assets  Assets: Communication Skills; Desire for Improvement; Social Support   Sleep  Sleep: Sleep: Fair    Physical Exam: Physical Exam Vitals reviewed.  Constitutional:      General: He is not in acute distress.    Appearance: He is normal weight. He is not toxic-appearing.  Neurological:     Mental Status: He is alert.     Motor: No weakness.     Gait: Gait normal.    Review of Systems  Constitutional:  Negative for chills and fever.  Cardiovascular:  Negative for chest pain and palpitations.  Neurological:  Negative for dizziness, tingling, tremors and headaches.  Psychiatric/Behavioral:  Negative for depression, hallucinations, memory loss, substance abuse and suicidal ideas. The patient is not nervous/anxious and does not have insomnia.   All other systems reviewed and are negative.  Blood pressure (!) 130/95, pulse (!) 101, temperature 97.8 F (36.6 C), temperature source Oral, resp. rate 18, height 5\' 10"  (1.778 m), weight 120.2 kg, SpO2 99 %. Body mass index is 38.02 kg/m.   Social History   Tobacco Use  Smoking Status Every Day   Packs/day: 1.00   Types: Cigarettes  Smokeless Tobacco Not on file   Tobacco Cessation:  {Discharge tobacco cessation  prescription:304700209}   Blood Alcohol level:  Lab Results  Component Value Date   ETH <10 07/25/2022   ETH <11 11/29/2012    Metabolic Disorder Labs:  Lab Results  Component Value Date   HGBA1C 5.5 07/09/2022   MPG 111 07/09/2022   No results found for: "PROLACTIN" Lab Results  Component Value Date   CHOL 168 07/09/2022   TRIG 138 07/09/2022   HDL 31 (L) 07/09/2022   CHOLHDL 5.4 07/09/2022   VLDL 28 07/09/2022   LDLCALC 109 (H) 07/09/2022    See Psychiatric Specialty Exam and Suicide Risk Assessment completed by Attending Physician prior to discharge.  Discharge destination:  {DISCHARGE DESTINATION:22616}  Is patient on multiple antipsychotic therapies at discharge:  {RECOMMEND TAPERING:22617}   Has Patient had three or more failed trials of antipsychotic monotherapy by history:  {BHH ANTIPSYCHOTIC:22903}  Recommended Plan for Multiple Antipsychotic Therapies: {[facility] MULTIPLE ANTIPSYCHOTIC THERAPIES:22905}  Discharge Instructions  Diet - low sodium heart healthy   Complete by: As directed    Increase activity slowly   Complete by: As directed       Allergies as of 07/31/2022       Reactions   Fish Allergy Anaphylaxis   Iodine Anaphylaxis, Other (See Comments), Swelling   Pt states that he gets swelling of his throat and trouble breathing with the contrast. States that he has never been premedicated before. 08/13/16 jzp Other Reaction(s): Unknown Reaction Type: Allergy   Other Shortness Of Breath, Other (See Comments)   Succinylcholine - Pulmonary edema Gadolinium - Anaphylaxis   Shellfish Allergy Anaphylaxis, Swelling   Shellfish-derived Products Anaphylaxis   "Seafood." Reaction Type: Allergy; Reaction(s): Anaphylactic reaction to food,unk "Seafood."   Succinylcholine Anaphylaxis   Other Reaction(s): Other Reaction Type: Allergy; Reaction(s): Anaphylactic shock due to serum,unk   Barium Sulfate    Other Reaction(s): Other Reaction Type: Allergy;  Reaction(s): Anaphase constriction,unk   Nitrous Oxide Other (See Comments)   Pulmonary edema        Medication List     STOP taking these medications    clonazePAM 0.5 MG tablet Commonly known as: KLONOPIN   FLUoxetine 20 MG capsule Commonly known as: PROZAC   Lacosamide 100 MG Tabs   lamoTRIgine 200 MG tablet Commonly known as: LAMICTAL   levETIRAcetam 1000 MG tablet Commonly known as: KEPPRA   phenytoin 100 MG ER capsule Commonly known as: DILANTIN       TAKE these medications      Indication  amLODipine 10 MG tablet Commonly known as: NORVASC Take 1 tablet (10 mg total) by mouth daily.  Indication: High Blood Pressure Disorder   hydrOXYzine 50 MG tablet Commonly known as: ATARAX Take 1 tablet (50 mg total) by mouth 3 (three) times daily as needed for anxiety. What changed:  when to take this reasons to take this  Indication: Feeling Anxious   losartan 25 MG tablet Commonly known as: COZAAR Take 0.5 tablets by mouth daily.  Indication: High Blood Pressure Disorder   lovastatin 20 MG tablet Commonly known as: MEVACOR Take 20 mg by mouth at bedtime.  Indication: High Amount of Fats in the Blood   melatonin 5 MG Tabs Take 1 tablet (5 mg total) by mouth at bedtime.  Indication: Trouble Sleeping   nicotine polacrilex 2 MG gum Commonly known as: NICORETTE Take 1 each (2 mg total) by mouth as needed for smoking cessation.  Indication: Nicotine Addiction   prazosin 1 MG capsule Commonly known as: MINIPRESS Take 3 capsules (3 mg total) by mouth at bedtime. What changed:  medication strength how much to take  Indication: High Blood Pressure Disorder, Frightening Dreams   sertraline 100 MG tablet Commonly known as: ZOLOFT Take 1 tablet (100 mg total) by mouth daily. Start taking on: August 01, 2022 What changed:  medication strength how much to take  Indication: Major Depressive Disorder   topiramate 25 MG tablet Commonly known as:  TOPAMAX Take 1 tablet (25 mg total) by mouth 2 (two) times daily.  Indication: epliepsy   traZODone 50 MG tablet Commonly known as: DESYREL Take 1 tablet (50 mg total) by mouth at bedtime as needed for sleep.  Indication: Trouble Sleeping, Major Depressive Disorder        Follow-up Information     [facility]. Schedule an appointment as soon as possible for a visit.   Why: Please call or go to this provider to schedule an appointment for therapy  and medication management services. Contact information: [address] [place], [place] [address]  P: [phone] F: [phone]                Follow-up recommendations:  {BHH DC FU RECOMMENDATIONS:22620}  Comments:  ***  Signed: Cristy Hilts, MD 07/31/2022, 9:57 AM    Total Time Spent in Direct Patient Care:  I personally spent 35 minutes on the unit in direct patient care. The direct patient care time included face-to-face time with the patient, reviewing the patient's chart, communicating with other professionals, and coordinating care. Greater than 50% of this time was spent in counseling or coordinating care with the patient regarding goals of hospitalization, psycho-education, and discharge planning needs.   Phineas Inches, MD Psychiatrist

## 2022-07-31 NOTE — Progress Notes (Signed)
   07/31/22 0939  Psych Admission Type (Psych Patients Only)  Admission Status Voluntary  Psychosocial Assessment  Patient Complaints Anxiety;Depression  Eye Contact Fair  Facial Expression Animated  Affect Appropriate to circumstance  Speech Logical/coherent  Interaction Assertive  Appearance/Hygiene Unremarkable  Behavior Characteristics Cooperative;Appropriate to situation  Mood Anxious  Thought Process  Coherency WDL  Content WDL  Delusions None reported or observed  Perception WDL  Hallucination None reported or observed  Judgment WDL  Confusion None  Danger to Self  Current suicidal ideation? Denies  Agreement Not to Harm Self Yes  Description of Agreement verbal  Danger to Others  Danger to Others None reported or observed

## 2022-07-31 NOTE — BHH Suicide Risk Assessment (Signed)
[facility] Discharge Suicide Risk Assessment   Principal Problem: MDD (major depressive disorder), recurrent severe, without psychosis ([place]) Discharge Diagnoses: Principal Problem:   MDD (major depressive disorder), recurrent severe, without psychosis ([place]) Active Problems:   PTSD (post-traumatic stress disorder)   Generalized anxiety disorder   Bereavement   TBI (traumatic brain injury) ([place])   Cocaine use disorder, severe, in early remission, in controlled environment ([place])   Malingering   Total Time spent with patient: 15 minutes  Arthur Singleton is a 37 yo male w/ history of MDD, GAD, TBI, epilepsy presenting [facility] for claimed suicide attempt via medication overdose. Of note, patient was hospitalized at [facility] 07/09/22-07/15/2022, admitted to [facility] in [place] from 07/16/22-07/23/22, 2x ED visits in [place] visit 12/29 for "unwitnessed seizure", then 12/30 at [facility] for suicidal ideation prior to admission on 07/26/2022.    During the patient's hospitalization, patient had extensive initial psychiatric evaluation, and follow-up psychiatric evaluations every day.  Psychiatric diagnoses provided upon initial assessment:  MDD severe recurrent without psychotic features GAD PTSD Stimulant use disorder, in early remission Cannabis use disorder, in early remission Malingering TBI History of epilepsy  Patient's psychiatric medications were adjusted on admission:  -stop prozac, lamictal, and norvasc -Start zoloft 50 mg daily for depression -Restart hydroxyzine 50 mg tid prn for anxiety -Restart prazosin 2 mg qhs for ptsd nightmares             -monitor BP  During the hospitalization, other adjustments were made to the patient's psychiatric medication regimen:  -zoloft was incr to 100 mg once daily -prazosin was started and incr to 3 mg qhs -topamax was started at 25 mg bid for mood stability and as AED   Patient's care was discussed during the  interdisciplinary team meeting every day during the hospitalization.  The patient denied having side effects to prescribed psychiatric medication.  Gradually, patient started adjusting to milieu. The patient was evaluated each day by a clinical provider to ascertain response to treatment. Improvement was noted by the patient's report of decreasing symptoms, improved sleep and appetite, affect, medication tolerance, behavior, and participation in unit programming.  Patient was asked each day to complete a self inventory noting mood, mental status, pain, new symptoms, anxiety and concerns.    Symptoms were reported as significantly decreased or resolved completely by discharge.   On day of discharge, the patient reports that their mood is stable. The patient denied having suicidal thoughts on day of discharge and reported that SI had returned to chronic baseline on days leading up to dc.  Patient denies having homicidal thoughts.  Patient denies having auditory hallucinations.  Patient denies any visual hallucinations or other symptoms of psychosis. The patient was motivated to continue taking medication with a goal of continued improvement in mental health.   The patient reports their target psychiatric symptoms of depression and intense suicidal thoughts (pt reports having no SI at time of discharge and leading up to discharge he reported that the intensity and frequency of his SI had returned to baseline, chronic), all responded well to the psychiatric medications, and the patient reports overall benefit other psychiatric hospitalization. Supportive psychotherapy was provided to the patient. The patient also participated in regular group therapy while hospitalized. Coping skills, problem solving as well as relaxation therapies were also part of the unit programming.  Labs were reviewed with the patient, and abnormal results were discussed with the patient.  The patient is able to verbalize their  individual safety plan to this provider.  # It is recommended to the patient to continue psychiatric medications as prescribed, after discharge from the hospital.    # It is recommended to the patient to follow up with your outpatient psychiatric provider and PCP.  # It was discussed with the patient, the impact of alcohol, drugs, tobacco have been there overall psychiatric and medical wellbeing, and total abstinence from substance use was recommended the patient.ed.  # Prescriptions provided or sent directly to preferred pharmacy at discharge. Patient agreeable to plan. Given opportunity to ask questions. Appears to feel comfortable with discharge.    # In the event of worsening symptoms, the patient is instructed to call the crisis hotline, 911 and or go to the nearest ED for appropriate evaluation and treatment of symptoms. To follow-up with primary care provider for other medical issues, concerns and or health care needs  # Patient was discharged with bus pass to get to [place], where he can get a bus to his house in [place], [place], with a plan to follow up as noted below.      Psychiatric Specialty Exam  Presentation  General Appearance:  Appropriate for Environment; Casual; Fairly Groomed  Eye Contact: Fair  Speech: Clear and Coherent; Normal Rate  Speech Volume: Normal  Handedness: Right   Mood and Affect  Mood: Euthymic  Duration of Depression Symptoms: No data recorded Affect: Appropriate; Congruent; Full Range   Thought Process  Thought Processes: Linear  Descriptions of Associations:Intact  Orientation:Full (Time, Place and Person)  Thought Content:Logical  History of Schizophrenia/Schizoaffective disorder:No data recorded Duration of Psychotic Symptoms:No data recorded Hallucinations:Hallucinations: None  Ideas of Reference:None  Suicidal Thoughts:Suicidal Thoughts: No  Homicidal Thoughts:Homicidal Thoughts: No   Sensorium  Memory: Immediate  Good; Recent Good; Remote Good  Judgment: Fair  Insight: Fair   Materials engineer: Fair  Attention Span: Fair  Recall: [employer] of Knowledge: Fair  Language: Fair   Psychomotor Activity  Psychomotor Activity: Psychomotor Activity: Normal   Assets  Assets: Communication Skills; Desire for Improvement; Social Support   Sleep  Sleep: Sleep: Fair   Physical Exam: Physical Exam See discharge summary  ROS See discharge summary  Blood pressure (!) 130/95, pulse (!) 101, temperature 97.8 F (36.6 C), temperature source Oral, resp. rate 18, height 5\' 10"  (1.778 m), weight 120.2 kg, SpO2 99 %. Body mass index is 38.02 kg/m.  Mental Status Per Nursing Assessment::   On Admission:  Suicidal ideation indicated by patient, Self-harm behaviors  Demographic factors:  Male, Caucasian, Living alone, Low socioeconomic status Loss Factors:  Loss of significant relationship Historical Factors:  Prior suicide attempts, Impulsivity, Anniversary of important loss Risk Reduction Factors:   Positive coping skills or problem solving skills  Continued Clinical Symptoms:  MDD - mood is stable. Denying SI on day of dc. Reports chronic SI at baseline on days leading up to dc.   Cognitive Features That Contribute To Risk:  None    Suicide Risk:  Mild:  There are no identifiable suicide plans, no associated intent, mild dysphoria and related symptoms, good self-control (both objective and subjective assessment), few other risk factors, and identifiable protective factors, including available and accessible social support.    Follow-up Information     [facility]. Schedule an appointment as soon as possible for a visit.   Why: Please call or go to this provider to schedule an appointment for therapy and medication management services. Contact information: [address] [place],  [address]  P: [phone] F: [phone]                 Plan Of Care/Follow-up recommendations:    Activity: as tolerated  Diet: heart healthy  Other: -Follow-up with your outpatient psychiatric provider -instructions on appointment date, time, and address (location) are provided to you in discharge paperwork.  -Take your psychiatric medications as prescribed at discharge - instructions are provided to you in the discharge paperwork  -Follow-up with outpatient primary care doctor and other specialists -for management of preventative medicine and chronic medical disease  -Recommend abstinence from alcohol, tobacco, and other illicit drug use at discharge.   -If your psychiatric symptoms recur, worsen, or if you have side effects to your psychiatric medications, call your outpatient psychiatric provider, 911, [phone] or go to the nearest emergency department.  -If suicidal thoughts recur, call your outpatient psychiatric provider, 911, [phone] or go to the nearest emergency department.   Christoper Allegra, MD 07/31/2022, 9:58 AM
